# Patient Record
Sex: Male | Born: 1935
Health system: Southern US, Community
[De-identification: ages and names within clinical notes are randomized; demographics above are authoritative.]

## PROBLEM LIST (undated history)

## (undated) DIAGNOSIS — I2510 Atherosclerotic heart disease of native coronary artery without angina pectoris: Secondary | ICD-10-CM

## (undated) DIAGNOSIS — H269 Unspecified cataract: Secondary | ICD-10-CM

## (undated) DIAGNOSIS — C61 Malignant neoplasm of prostate: Secondary | ICD-10-CM

## (undated) DIAGNOSIS — G4730 Sleep apnea, unspecified: Secondary | ICD-10-CM

## (undated) DIAGNOSIS — E785 Hyperlipidemia, unspecified: Secondary | ICD-10-CM

## (undated) DIAGNOSIS — E119 Type 2 diabetes mellitus without complications: Secondary | ICD-10-CM

## (undated) DIAGNOSIS — G629 Polyneuropathy, unspecified: Secondary | ICD-10-CM

## (undated) DIAGNOSIS — I10 Essential (primary) hypertension: Secondary | ICD-10-CM

## (undated) DIAGNOSIS — I639 Cerebral infarction, unspecified: Secondary | ICD-10-CM

## (undated) DIAGNOSIS — I214 Non-ST elevation (NSTEMI) myocardial infarction: Secondary | ICD-10-CM

## (undated) DIAGNOSIS — U071 COVID-19: Secondary | ICD-10-CM

## (undated) HISTORY — PX: TONSILLECTOMY: SUR1361

## (undated) HISTORY — DX: Cerebral infarction, unspecified: I63.9

## (undated) HISTORY — DX: COVID-19: U07.1

## (undated) HISTORY — PX: INGUINAL HERNIA REPAIR: SUR1180

## (undated) HISTORY — PX: PROSTATE SURGERY: SHX751

## (undated) HISTORY — PX: NOSE SURGERY: SHX723

## (undated) HISTORY — PX: EYE SURGERY: SHX253

## (undated) HISTORY — DX: Atherosclerotic heart disease of native coronary artery without angina pectoris: I25.10

## (undated) HISTORY — DX: Polyneuropathy, unspecified: G62.9

## (undated) HISTORY — DX: Unspecified cataract: H26.9

## (undated) HISTORY — DX: Essential (primary) hypertension: I10

## (undated) HISTORY — DX: Hyperlipidemia, unspecified: E78.5

## (undated) HISTORY — DX: Type 2 diabetes mellitus without complications: E11.9

## (undated) HISTORY — DX: Malignant neoplasm of prostate: C61

## (undated) HISTORY — DX: Non-ST elevation (NSTEMI) myocardial infarction: I21.4

## (undated) HISTORY — PX: KNEE SURGERY: SHX244

## (undated) HISTORY — PX: COLONOSCOPY: SHX174

## (undated) HISTORY — DX: Sleep apnea, unspecified: G47.30

---

## 2001-09-09 ENCOUNTER — Encounter: Payer: Self-pay | Admitting: *Deleted

## 2001-09-09 ENCOUNTER — Inpatient Hospital Stay (HOSPITAL_COMMUNITY): Admission: EM | Admit: 2001-09-09 | Discharge: 2001-09-12 | Payer: Self-pay | Admitting: Cardiology

## 2001-10-03 ENCOUNTER — Encounter (HOSPITAL_COMMUNITY): Admission: RE | Admit: 2001-10-03 | Discharge: 2001-11-02 | Payer: Self-pay | Admitting: Cardiology

## 2001-11-13 ENCOUNTER — Encounter (HOSPITAL_COMMUNITY): Admission: RE | Admit: 2001-11-13 | Discharge: 2001-12-13 | Payer: Self-pay | Admitting: Cardiology

## 2001-12-15 ENCOUNTER — Encounter (HOSPITAL_COMMUNITY): Admission: RE | Admit: 2001-12-15 | Discharge: 2002-01-14 | Payer: Self-pay | Admitting: Cardiology

## 2002-01-15 ENCOUNTER — Encounter (HOSPITAL_COMMUNITY): Admission: RE | Admit: 2002-01-15 | Discharge: 2002-02-14 | Payer: Self-pay | Admitting: Cardiology

## 2002-03-13 ENCOUNTER — Emergency Department (HOSPITAL_COMMUNITY): Admission: EM | Admit: 2002-03-13 | Discharge: 2002-03-13 | Payer: Self-pay | Admitting: Emergency Medicine

## 2002-03-13 ENCOUNTER — Encounter: Payer: Self-pay | Admitting: Emergency Medicine

## 2003-08-26 ENCOUNTER — Encounter: Payer: Self-pay | Admitting: Family Medicine

## 2003-08-26 ENCOUNTER — Ambulatory Visit (HOSPITAL_COMMUNITY): Admission: RE | Admit: 2003-08-26 | Discharge: 2003-08-26 | Payer: Self-pay | Admitting: Family Medicine

## 2003-11-05 ENCOUNTER — Ambulatory Visit (HOSPITAL_COMMUNITY): Admission: RE | Admit: 2003-11-05 | Discharge: 2003-11-05 | Payer: Self-pay | Admitting: Internal Medicine

## 2003-12-18 ENCOUNTER — Emergency Department (HOSPITAL_COMMUNITY): Admission: EM | Admit: 2003-12-18 | Discharge: 2003-12-18 | Payer: Self-pay | Admitting: Emergency Medicine

## 2003-12-20 ENCOUNTER — Ambulatory Visit (HOSPITAL_COMMUNITY): Admission: RE | Admit: 2003-12-20 | Discharge: 2003-12-20 | Payer: Self-pay | Admitting: Family Medicine

## 2004-03-02 ENCOUNTER — Ambulatory Visit (HOSPITAL_COMMUNITY): Admission: RE | Admit: 2004-03-02 | Discharge: 2004-03-02 | Payer: Self-pay | Admitting: Orthopaedic Surgery

## 2004-04-21 ENCOUNTER — Ambulatory Visit: Admission: RE | Admit: 2004-04-21 | Discharge: 2004-07-20 | Payer: Self-pay

## 2004-09-02 ENCOUNTER — Ambulatory Visit (HOSPITAL_COMMUNITY): Admission: RE | Admit: 2004-09-02 | Discharge: 2004-09-02 | Payer: Self-pay | Admitting: Urology

## 2004-09-02 ENCOUNTER — Ambulatory Visit (HOSPITAL_BASED_OUTPATIENT_CLINIC_OR_DEPARTMENT_OTHER): Admission: RE | Admit: 2004-09-02 | Discharge: 2004-09-02 | Payer: Self-pay | Admitting: Urology

## 2004-09-07 ENCOUNTER — Ambulatory Visit (HOSPITAL_COMMUNITY): Admission: RE | Admit: 2004-09-07 | Discharge: 2004-09-07 | Payer: Self-pay | Admitting: Family Medicine

## 2004-09-22 ENCOUNTER — Ambulatory Visit: Admission: RE | Admit: 2004-09-22 | Discharge: 2004-10-19 | Payer: Self-pay | Admitting: Radiation Oncology

## 2005-06-20 ENCOUNTER — Ambulatory Visit: Admission: RE | Admit: 2005-06-20 | Discharge: 2005-06-20 | Payer: Self-pay | Admitting: Family Medicine

## 2005-06-28 ENCOUNTER — Ambulatory Visit: Payer: Self-pay | Admitting: Pulmonary Disease

## 2006-03-10 ENCOUNTER — Ambulatory Visit (HOSPITAL_COMMUNITY): Admission: RE | Admit: 2006-03-10 | Discharge: 2006-03-10 | Payer: Self-pay | Admitting: Gastroenterology

## 2006-03-14 ENCOUNTER — Ambulatory Visit: Payer: Self-pay | Admitting: Gastroenterology

## 2006-06-05 ENCOUNTER — Emergency Department (HOSPITAL_COMMUNITY): Admission: EM | Admit: 2006-06-05 | Discharge: 2006-06-05 | Payer: Self-pay | Admitting: Emergency Medicine

## 2007-03-13 ENCOUNTER — Ambulatory Visit: Payer: Self-pay | Admitting: Cardiology

## 2007-03-13 ENCOUNTER — Observation Stay (HOSPITAL_COMMUNITY): Admission: EM | Admit: 2007-03-13 | Discharge: 2007-03-14 | Payer: Self-pay | Admitting: Emergency Medicine

## 2007-03-16 ENCOUNTER — Ambulatory Visit: Payer: Self-pay

## 2007-03-29 ENCOUNTER — Ambulatory Visit: Payer: Self-pay

## 2007-04-25 ENCOUNTER — Ambulatory Visit: Payer: Self-pay | Admitting: Cardiology

## 2007-09-13 ENCOUNTER — Ambulatory Visit (HOSPITAL_BASED_OUTPATIENT_CLINIC_OR_DEPARTMENT_OTHER): Admission: RE | Admit: 2007-09-13 | Discharge: 2007-09-13 | Payer: Self-pay | Admitting: Surgery

## 2007-10-03 ENCOUNTER — Ambulatory Visit: Payer: Self-pay | Admitting: Cardiology

## 2008-08-28 ENCOUNTER — Emergency Department (HOSPITAL_COMMUNITY): Admission: EM | Admit: 2008-08-28 | Discharge: 2008-08-28 | Payer: Self-pay | Admitting: Emergency Medicine

## 2008-12-31 ENCOUNTER — Ambulatory Visit: Payer: Self-pay | Admitting: Cardiology

## 2009-10-07 ENCOUNTER — Encounter: Payer: Self-pay | Admitting: Cardiology

## 2010-01-19 DIAGNOSIS — E785 Hyperlipidemia, unspecified: Secondary | ICD-10-CM | POA: Insufficient documentation

## 2010-01-19 DIAGNOSIS — G589 Mononeuropathy, unspecified: Secondary | ICD-10-CM | POA: Insufficient documentation

## 2010-01-19 DIAGNOSIS — G4733 Obstructive sleep apnea (adult) (pediatric): Secondary | ICD-10-CM | POA: Insufficient documentation

## 2010-01-19 DIAGNOSIS — I2510 Atherosclerotic heart disease of native coronary artery without angina pectoris: Secondary | ICD-10-CM | POA: Insufficient documentation

## 2010-01-19 DIAGNOSIS — Z8546 Personal history of malignant neoplasm of prostate: Secondary | ICD-10-CM | POA: Insufficient documentation

## 2010-01-19 DIAGNOSIS — I10 Essential (primary) hypertension: Secondary | ICD-10-CM | POA: Insufficient documentation

## 2010-01-20 ENCOUNTER — Ambulatory Visit: Payer: Self-pay | Admitting: Cardiology

## 2010-12-09 ENCOUNTER — Telehealth: Payer: Self-pay | Admitting: Cardiology

## 2011-01-05 NOTE — Assessment & Plan Note (Addendum)
Summary: f1y   Visit Type:  1 year follow up  CC:  No cardiac complains.  History of Present Illness: Has a dry cough, but no productive cough.  Drl [name] checked his cholesterol a while back.  No chest pain at all.  It has been more than 8 years since his MI.    Current Medications (verified): 1)  Gabapentin 300 Mg Caps (Gabapentin) .... Take 1 Capsule By Mouth Two Times A Day 2)  Doxazosin Mesylate 2 Mg Tabs (Doxazosin Mesylate) .... Take 1 Tablet By Mouth Once A Day 3)  Metoprolol Tartrate 25 Mg Tabs (Metoprolol Tartrate) .... Take One Tablet By Mouth Twice A Day 4)  Nortriptyline Hcl 25 Mg Caps (Nortriptyline Hcl) .... Take 1 Capsule By Mouth Three Times A Day 5)  Metformin Hcl 500 Mg Tabs (Metformin Hcl) .... Take 1 Tablet By Mouth Two Times A Day 6)  Aspirin 81 Mg Tbec (Aspirin) .... Take One Tablet By Mouth Daily 7)  Calcium Carbonate-Vitamin D 600-400 Mg-Unit  Tabs (Calcium Carbonate-Vitamin D) .... Take 1 Tablet By Mouth Once A Day 8)  Multivitamins  Tabs (Multiple Vitamin) .... Take 1 Tablet By Mouth Once A Day 9)  Vitamin D 5,000 Iu Cap .... Take 1 Capsule By Mouth Once A Day 10)  Lipitor 80 Mg Tabs (Atorvastatin Calcium) .... Take One Tablet By Mouth Daily. 11)  Losartan Potassium 50 Mg Tabs (Losartan Potassium) .... Take 1 Tablet By Mouth Once A Day  Allergies (verified): No Known Drug Allergies  Vital Signs:  Patient profile:   77 year old male Height:      74 inches Weight:      167.75 pounds BMI:     21.62 Pulse rate:   65 / minute Pulse rhythm:   irregular Resp:     18 per minute BP sitting:   159 / 76  (left arm) Cuff size:   large  Vitals Entered By: Sidney Ace (January 20, 2010 9:59 AM)  Physical Exam  General:  Well developed, well nourished, in no acute distress. Head:  normocephalic and atraumatic Eyes:  PERRLA/EOM intact; conjunctiva and lids normal. Ears:  TM's intact and clear with normal canals and hearing Lungs:  Clear bilaterally to  auscultation and percussion. Heart:  PMI non displaced.  Minimal SEM.  Pos S4.    Extremities:  No clubbing or cyanosis.   Impression & Recommendations:  Problem # 1:  CAD (ICD-414.00) Absolutely no symptoms.   Cannot exercise because of neuropathy.  Anatomy reviewed.  (2 non DES in CFX and RCA).   His updated medication list for this problem includes:    Metoprolol Tartrate 25 Mg Tabs (Metoprolol tartrate) .Marland Kitchen... Take one tablet by mouth twice a day    Aspirin 81 Mg Tbec (Aspirin) .Marland Kitchen... Take one tablet by mouth daily  Problem # 2:  HYPERLIPIDEMIA (ICD-272.4)  High dose statins, with followup with Dr. [name].   His updated medication list for this problem includes:    Lipitor 80 Mg Tabs (Atorvastatin calcium) .Marland Kitchen... Take one tablet by mouth daily.  Orders: EKG w/ Interpretation (93000)  His updated medication list for this problem includes:    Lipitor 80 Mg Tabs (Atorvastatin calcium) .Marland Kitchen... Take one tablet by mouth daily.  Problem # 3:  HYPERTENSION, UNSPECIFIED (ICD-401.9) Controlled.  May consider carvedilol in future  [id number].   His updated medication list for this problem includes:    Doxazosin Mesylate 2 Mg Tabs (Doxazosin mesylate) .Marland Kitchen... Take 1 tablet by mouth  once a day    Metoprolol Tartrate 25 Mg Tabs (Metoprolol tartrate) .Marland Kitchen... Take one tablet by mouth twice a day    Aspirin 81 Mg Tbec (Aspirin) .Marland Kitchen... Take one tablet by mouth daily    Losartan Potassium 50 Mg Tabs (Losartan potassium) .Marland Kitchen... Take 1 tablet by mouth once a day  Orders: EKG w/ Interpretation (93000)  Patient Instructions: 1)  Your physician recommends that you continue on your current medications as directed. Please refer to the Current Medication list given to you today. 2)  Your physician wants you to follow-up in:  1 YEAR.  You will receive a reminder letter in the mail two months in advance. If you don't receive a letter, please call our office to schedule the follow-up appointment.

## 2011-01-07 NOTE — Progress Notes (Signed)
Summary: Call from Dr Lance Sell office  Phone Note From Other Clinic   Caller: Pam with Dr Sallee Lange Summary of Call: Dr [name] would like Dr Maren Beach opinion about whether it would be safe for this pt to take a low dose Nortriptyline for neuropathy.  We can call there office at [phone].  Initial call taken by: Theodosia Quay, RN, BSN,  December 09, 2010 11:53 AM  Follow-up for Phone Call        pam from dr luking's office calling re msg left on the 4th re nortriptyline for neuropathy-pls call [phone] Lorenda Hatchet  December 11, 2010 2:40 PM  Dr Lia Foyer would defer to Dr Lance Sell judgment. However tricyclics have been linked to increase BP, DM and other traditional cardiac risk factors.  Theodosia Quay, RN, BSN  December 11, 2010 6:15 PM  Additional Follow-up for Phone Call Additional follow up Details #1::        I spoke with Pam and made her aware of Dr Maren Beach recommendations.  I also made her aware that per our office note in February 2011 this pt had Nortriptyline on his medication list.  Additional Follow-up by: Theodosia Quay, RN, BSN,  December 14, 2010 10:16 AM

## 2011-01-26 ENCOUNTER — Encounter: Payer: Self-pay | Admitting: Cardiology

## 2011-01-26 ENCOUNTER — Ambulatory Visit (INDEPENDENT_AMBULATORY_CARE_PROVIDER_SITE_OTHER): Payer: Medicare Other | Admitting: Cardiology

## 2011-01-26 DIAGNOSIS — E7800 Pure hypercholesterolemia, unspecified: Secondary | ICD-10-CM

## 2011-01-26 DIAGNOSIS — I2510 Atherosclerotic heart disease of native coronary artery without angina pectoris: Secondary | ICD-10-CM

## 2011-01-26 DIAGNOSIS — I10 Essential (primary) hypertension: Secondary | ICD-10-CM

## 2011-02-16 NOTE — Assessment & Plan Note (Signed)
Summary: f1y   CC:  check up.  History of Present Illness: Mr. Sondgeroth is in for followup.  Doing well at present.  No major issues.  Feels good.  We did get call about using tricyclics for neuropathy, but with BP think that probably should be avoided unless Dr. [name] thinks that is required.  Was taking before.   Problems Prior to Update: 1)  Cad  (ICD-414.00) 2)  Hypertension, Unspecified  (ICD-401.9) 3)  Hyperlipidemia  (ICD-272.4) 4)  Sleep Apnea, Obstructive  (ICD-327.23) 5)  Neuropathy  (ICD-355.9) 6)  Prostate Cancer, Hx of  (ICD-V10.46)  Current Medications (verified): 1)  Metoprolol Tartrate 25 Mg Tabs (Metoprolol Tartrate) .... Take One Tablet By Mouth Twice A Day 2)  Metformin Hcl 500 Mg Tabs (Metformin Hcl) .... Take 1 Tablet By Mouth Two Times A Day 3)  Aspirin 81 Mg Tbec (Aspirin) .... Take One Tablet By Mouth Daily 4)  Calcium Carbonate-Vitamin D 600-400 Mg-Unit  Tabs (Calcium Carbonate-Vitamin D) .... Take 1 Tablet By Mouth Once A Day 5)  Multivitamins  Tabs (Multiple Vitamin) .... Take 1 Tablet By Mouth Once A Day 6)  Vitamin D 5,000 Iu Cap .... Take 1 Capsule By Mouth Once A Day 7)  Lipitor 80 Mg Tabs (Atorvastatin Calcium) .... Take One Tablet By Mouth Daily. 8)  Losartan Potassium 100 Mg Tabs (Losartan Potassium) .Marland Kitchen.. 1 Tab By Mouth Once Daily 9)  Lyrica 50 Mg Caps (Pregabalin) .Marland Kitchen.. 1  Tab By Mouth Once Daily 10)  Vitamin B-12 1000 Mcg Tabs (Cyanocobalamin) .Marland Kitchen.. 1 Tab By Mouth Once Daily 11)  Fish Oil   Oil (Fish Oil) .Marland Kitchen.. 1  Tab By Mouth Once Daily 12)  Allegra Allergy 180 Mg Tabs (Fexofenadine Hcl) .... As Needed  Allergies (verified): No Known Drug Allergies  Past History:  Past Medical History: Last updated: 02-15-10 Current Problems:  CAD (ICD-414.00)0-  HYPERTENSION, UNSPECIFIED (ICD-401.9) HYPERLIPIDEMIA (ICD-272.4) SLEEP APNEA, OBSTRUCTIVE (ICD-327.23) NEUROPATHY (ICD-355.9) PROSTATE CANCER, HX OF (ICD-V10.46)  Family History: Last updated:  2010-02-15  Mother deceased in her 32s secondary to Alzheimer's, also had a history of MI.  Father deceased at age 9, history of coronary artery disease status post MI.  No siblings.  Social History: Last updated: Feb 15, 2010  He lives in [place] with his wife.  He is retired  but currently Development worker, community for [employer].  Denies any tobacco, EtOH or herbal medication use.  She tries to follow a heart-healthy diet.  He is very active.  He walks.  Vital Signs:  Patient profile:   77 year old male Height:      74 inches Weight:      161 pounds BMI:     20.75 Pulse rate:   57 / minute Resp:     18 per minute BP sitting:   190 / 90  (left arm)  Vitals Entered By: Burnett Kanaris (January 26, 2011 3:27 PM)  Physical Exam  General:  Well developed, well nourished, in no acute distress. Head:  normocephalic and atraumatic Eyes:  PERRLA/EOM intact; conjunctiva and lids normal. Lungs:  Clear bilaterally to auscultation and percussion. Heart:  PMI non displaced.  Normal S1 and S2. Minimal SEM no  rub.  Recheck of BP 180/80.   Abdomen:  Bowel sounds positive; abdomen soft and non-tender without masses, organomegaly, or hernias noted. No hepatosplenomegaly. Extremities:  No clubbing or cyanosis. Neurologic:  Alert and oriented x 3.   EKG  Procedure date:  01/26/2011  Findings:  SB with first degree av block, otherwise unremarkable.    Impression & Recommendations:  Problem # 1:  CAD (ICD-414.00) Continues to remain stable with no current symptoms.   His updated medication list for this problem includes:    Metoprolol Tartrate 25 Mg Tabs (Metoprolol tartrate) .Marland Kitchen... Take one tablet by mouth twice a day    Aspirin 81 Mg Tbec (Aspirin) .Marland Kitchen... Take one tablet by mouth daily  Orders: EKG w/ Interpretation (93000)  Problem # 2:  HYPERTENSION, UNSPECIFIED (ICD-401.9) Patients BP are elevated.  Think he should likely check BPs at home and bring list to Dr. [name] for  followup, and reassessment should they remain elevated.  On a multidrug regimen at this point.  Continue the same.  The following medications were removed from the medication list:    Doxazosin Mesylate 2 Mg Tabs (Doxazosin mesylate) .Marland Kitchen... Take 1 tablet by mouth once a day His updated medication list for this problem includes:    Metoprolol Tartrate 25 Mg Tabs (Metoprolol tartrate) .Marland Kitchen... Take one tablet by mouth twice a day    Aspirin 81 Mg Tbec (Aspirin) .Marland Kitchen... Take one tablet by mouth daily    Losartan Potassium 100 Mg Tabs (Losartan potassium) .Marland Kitchen... 1 tab by mouth once daily  Orders: EKG w/ Interpretation (93000)  Problem # 3:  HYPERLIPIDEMIA (ICD-272.4) remains on aggressive lipid lowering folllowed by Dr. [name].  His updated medication list for this problem includes:    Lipitor 80 Mg Tabs (Atorvastatin calcium) .Marland Kitchen... Take one tablet by mouth daily.  Patient Instructions: 1)  Your physician recommends that you schedule a follow-up appointment in: 1 year with Dr. Lia Foyer 2)  Your physician recommends that you continue on your current medications as directed. Please refer to the Current Medication list given to you today.

## 2011-04-20 NOTE — Letter (Signed)
December 31, 2008    Scott A. [name], MD  [address] [place], [place] [address]   RE:  TREQUON, PAONE  MRN:  [id number]  /  DOB:  05-31-1931   Dear Nicki Reaper:   I had the pleasure seeing your very nice patient, Luke Solis in the  office today in followup.  Cardiac wise he seems to be getting along  extraordinarily well.  He denies chest pain or significant shortness of  breath.  He is able to do almost anything, and has been working at  [employer] for [employer] and just enjoying himself.  He tells me that he has  been checking his sugars regularly, with evidence of underlying mild  diabetes.   MEDICATIONS:  Include  1. Zetia 10 mg daily.  2. Lisinopril 20 mg daily.  3. Neurontin 300 mg p.o. b.i.d.  4. Pamelor 25 mg 3 tablets daily.  5. Cardura 2 mg daily.  6. Multivitamin daily.  7. Toprol-XL 100 mg daily.  8. Aspirin 81 mg daily.  9. Zocor 80 mg at bedtime.  10.Calcium daily.  11.Fiber-Lax.  12.Vitamin D.   PHYSICAL EXAMINATION:  He is alert and oriented, in no distress.  Blood  pressure is 120/80, pulse is 67.  The lung fields are clear.  The  cardiac rhythm is regular with a soft S4 gallop.  The abdomen is soft  without obvious widened pulsation.   This very nice gentleman is doing extremely well.  I plan to have him  return to see me in followup at 1 year's time.  With regard to his  neuropathy, he has had burning in his feet, but certainly sounds like it  could be in part related to his diabetes.  I think it would be  progressive and might be worthwhile to get a neurologic opinion.  Certainly, we would leave this to your discretion.   He and I had a thorough discussion about his long-term situation and  with underlying diabetic coronary disease, it would be important to be  aggressive in his management.  I appreciate the opportunity of sharing  in his care and we will see him back in followup in 1 year's time.   ADDENDUM:  EKG reveals sinus rhythm, rate is 64.  PR  intervals 216  milliseconds compatible with first-degree AV block, otherwise normal  tracing.    Sincerely,      [name]. Lia Foyer, MD, [facility]  Electronically Signed    TDS/MedQ  DD: 12/31/2008  DT: 01/01/2009  Job #: [id number]

## 2011-04-20 NOTE — Op Note (Signed)
NAMEMITHUN, GOLDSWORTHY                 ACCOUNT NO.:  [id number]   MEDICAL RECORD NO.:  [id number]          PATIENT TYPE:  AMB   LOCATION:  DSC                          FACILITY:  [facility]   PHYSICIAN:  Earnstine Regal, MD      DATE OF BIRTH:  1931/10/18   DATE OF PROCEDURE:  09/13/2007  DATE OF DISCHARGE:                               OPERATIVE REPORT   PREOPERATIVE DIAGNOSIS:  Recurrent right inguinal hernia.   POSTOPERATIVE DIAGNOSIS:  Recurrent right inguinal hernia.   PROCEDURE:  Repair recurrent right inguinal hernia with Ethicon UltraPro  mesh.   SURGEON:  Earnstine Regal, MD, FACS   ANESTHESIA:  General.   ESTIMATED BLOOD LOSS:  Minimal.   PREPARATION:  Betadine.   COMPLICATIONS:  None.   INDICATIONS:  The patient is a 77 year old white male known to my  surgical practice.  The patient developed a recurrence of his right  inguinal hernia during the summer of 2008.  This has been reducible.  He  notes a burning-type sensation in the groin.  He had had a previous  repair 7 years ago by Dr. Romona Curls in [place].  He now comes to  surgery for repair of recurrent right inguinal hernia.   BODY OF REPORT:  The procedure was done in OR #6 at the [facility]  surgery center.  The patient is brought to the operating room, placed in  a supine position on the operating room table.  Following administration  of general anesthesia, the patient is prepped and draped in the usual  strict aseptic fashion.  After ascertaining that an adequate level of  anesthesia had been obtained, a right inguinal incision is made with a  #15 blade.  Dissection is carried through subcutaneous tissues.  External oblique fascia is incised in line with its fibers and extended  through the scar tissue which surrounds the external inguinal ring.  Cord structures are identified and carefully dissected out of the  inguinal canal using the electrocautery for hemostasis.  Margins of the  inguinal canal are defined.   Pubic tubercle is defined.  Spermatic cord  structures are mobilized and encircled with a Penrose drain.  Floor of  the inguinal canal is dissected out.  There appears to be an indirect  inguinal hernia sac.  This is dissected away from the cord structures.  Hernia sac is dissected out up to the level of the internal inguinal  ring.  The sac is opened.  A high ligation of the sac is performed with  a 2-0 silk suture ligature.  Sac is amputated with the electrocautery.  Next the floor of the inguinal canal is recreated with a sheet of  Ethicon UltraPro mesh.  Mesh is cut to the appropriate dimensions.  It  is secured to the pubic tubercle and along the inguinal ligament with a  running 2-0 Novofil suture.  Mesh is split to accommodate the cord  structures.  Superior margin of the mesh is secured to the transversalis  and internal oblique fascia with interrupted 2-0 Novofil sutures.  Tails  of the mesh  were overlapped lateral to the cord structures and secured  to the inguinal ligament with interrupted 2-0 Novofil suture.  Good  hemostasis is noted.  Local field block is placed with Marcaine.  Cord  structures are returned to the inguinal canal.  External oblique fascia  is closed with interrupted 3-0 Vicryl sutures.  Subcutaneous tissues are  closed with interrupted 3-0 Vicryl sutures.  Skin was anesthetized with  local anesthetic.  Skin is closed with a running 4-0 Vicryl subcuticular  suture.  Wound is washed and dried and Benzoin and Steri-Strips are  applied.  Sterile dressings are applied.  The patient is awakened from  anesthesia and brought to the recovery room in stable condition.  The  patient tolerated the procedure well.      Earnstine Regal, MD  Electronically Signed     TMG/MEDQ  D:  09/13/2007  T:  09/13/2007  Job:  [id number]   cc:   Nicki Reaper A. [name], MD  [name] Lia Foyer, MD, [facility]

## 2011-04-20 NOTE — Letter (Signed)
July 17, 2007    Earnstine Regal, MD  [phone], [place] [address]   RE:  [name], [name]  MRN:  [id number]  /  DOB:  04-Mar-1931   Dear Sherren Mocha:   Mr. Luke Solis is scheduled to have inguinal hernia surgery.  He should  be stable from a cardiac standpoint.  He has had previous stenting of  the right and circumflex coronary arteries and these are with non-drug  eluting platforms.  He has remained on chronic low dose aspirin.  In  addition, the patient had a radionuclide imaging study done in April  2008.  At that time there was no significant perfusion defect.  Based on  the current available data, he should be stable to undergo noncardiac  surgery.  We would be happy to see him during his hospitalization.  He  should continue his Toprol leading into the operation.  Please call us  for any questions.    Sincerely,      [name]. Lia Foyer, MD, [facility]  Electronically Signed    TDS/MedQ  DD: 07/17/2007  DT: 07/18/2007  Job #: [id number]

## 2011-04-20 NOTE — Letter (Signed)
October 03, 2007    Scott A. [name], MD  [address] [place], [place] [address]   RE:  Luke Solis  MRN:  [id number]  /  DOB:  1931/09/01   Dear Nicki Reaper,   I had the pleasure of seeing your nice patient, Luke Solis, in the  office today in followup. He has recently undergone a hernia repair by  Dr. Armandina Gemma. He apparently has done well from this standpoint. As  you know, he had non drug-eluting stents previously placed in the right  coronary artery and the circumflex system. He has had no real problems  with these. He has gotten along extremely well. Myocardial perfusion  imaging was done recently. This revealed no significant changes of  ischemia, had a normal stress nuclear study. Ejection fraction was 68%.   He is slow to recover but getting along reasonably well. His wife asked  whether or not he needs to wear his mask for sleep apnea, and I  certainly encouraged this.   His medications include:  1. Zetia 10 mg daily.  2. Lisinopril 20 mg daily.  3. Neurontin 300 mg b.i.d.  4. Pamelor 25 mg 3 tablets daily.  5. Cardura 2 mg daily.  6. Lipitor 80 mg q.h.s.  7. Prilosec 20 mg daily.  8. Multivitamin daily.  9. Toprol XL 100 mg a day.  10.Aspirin 81 mg daily.   On physical examination, he is alert and oriented. Blood pressure is  140/70, the pulse is 70. The lungs are clear and the cardiac rhythm is  regular.   Electrocardiogram  demonstrates a normal sinus rhythm with left axis  deviation.   To summarize, this very nice gentleman underwent hernia repair without  difficulty. He should remain on the same medical regimen. We will see  him back in followup in a year. Thanks for allowing me to share in his  care.    Sincerely,      [name]. Lia Foyer, MD, [facility]  Electronically Signed    TDS/MedQ  DD: 10/03/2007  DT: 10/04/2007  Job #: [id number]

## 2011-04-23 NOTE — Discharge Summary (Signed)
[place]  Patient:    Luke Solis, Luke Solis Visit Number: [id number] MRN: [id number]          Service Type: MED Location: [id number] Attending Physician:  Wadie Lessen Dictated by:   Sharyl Nimrod, P.A.-C. Admit Date:  09/09/2001 Discharge Date: 09/12/2001   CC:         Dr. Sallee Lange   Referring Physician Discharge Boling:  1931/09/06  SUMMARY OF HISTORY:  Luke Solis is a 77 year old white male who is referred for evaluation of chest discomfort to [facility] emergency room and was transferred for further evaluation.  The discomfort began at 8 a.m., began in his jaw, and he said he felt bad and hurt all over.  He felt clammy.  He walks one to one-and-a-half miles per day without difficulty.  He does have a history of hypertension, hyperlipidemia.  LABORATORY DATA:  At [facility], initial CK was 178 with an MB of 8.1, relative index 4.6, troponin 0.39.  Second CK was 167 with MB 9.0, relative index 5.4, troponin 0.95.  H&H was 12.2 and 34.1, normal indices, platelets 165, wbcs 7.1.  Sodium 141, potassium 5.2, BUN 13, creatinine 1.0, glucose 131.  Hemoglobin A1c 5.5.  EKGs show sinus bradycardia, slight ST elevation in leads III and aVF at [facility] on presentation; however, on subsequent EKGs this had resolved. Subsequent EKGs essentially showed sinus bradycardia, nonspecific ST-T wave changes, left axis deviation, left anterior hemiblock.  HOSPITAL COURSE:  Luke Solis was admitted to the unit 2000.  He was started on IV heparin and IIb/IIIa inhibitor.  He was started on aspirin and arrangements for cardiac catheterization were made.  On November 7 Dr. Lia Foyer performed cardiac catheterization.  According to his progress notes, this showed an EF of 50%.  He had a 95% mid RCA, 30% distal RCA, 90% mid circumflex, 30% proximal LAD, 70-80% mid LAD, with TIMI grade 2 flow.  Dr. Lia Foyer performed angioplasty  stenting to the mid RCA and mid circumflex, reducing both lesions to less than 10%.  Post sheath removal and bedrest he was ambulating without difficulty and medications were adjusted.  On October 8 Dr. Lia Foyer, after reviewing the chart, felt the patient could be discharged home.  DISCHARGE DIAGNOSES: 1. Non-Q-wave myocardial infarction. 2. Status post angioplasty stenting to the right coronary artery and    circumflex as previously described. 3. History of hyperlipidemia and hypertension.  DISPOSITION:  Patient is discharged home.  MEDICATIONS: 1. Lopressor 50 mg one-half tablet b.i.d. 2. Aspirin 325 mg q.d. 3. Plavix 75 mg q.d. for four weeks. 4. Lipitor 10 mg q.h.s. 5. Sublingual nitroglycerin as needed.  ACTIVITY:  He was advised no lifting, driving, sexual activity, or heavy exertion for one week.  He may walk and drive post one week.  No working until seeing the physician.  DIET:  Maintain low salt/fat/cholesterol diet.  WOUND CARE:  If he had any problems with his catheterization site he was asked to call.  SPECIAL INSTRUCTIONS:  He was instructed not to take Cardura.  FOLLOW-UP:  He will have a stress Cardiolite on October 22 at 8 a.m.  He was instructed nothing to eat or drink after midnight.  He will receive information in the mail, and he was asked not to take his Lopressor on the morning of the stress test.  He will follow up with Dr. Lia Foyer on September 27, 2001 at 10:15 a.m. in the  [place] office.  At some point in the future, consider to transfer the patient to the [place] office.  Information from Dr. [name] in regards to his lipid status should also be obtained and consideration should be given to referring patient to cardiac rehab in [place] for further education on dietary and exercise instruction. Dictated by:   Sharyl Nimrod, P.A.-C. Attending Physician:  Wadie Lessen DD:  09/12/01 TD:  09/12/01 Job: [id number]

## 2011-04-23 NOTE — Discharge Summary (Signed)
NAMEZAYION, Luke Solis                 ACCOUNT NO.:  [id number]   MEDICAL RECORD NO.:  [id number]          PATIENT TYPE:  OBV   LOCATION:  6524                         FACILITY:  [facility]   PHYSICIAN:  Shaune Pascal. Bensimhon, MDDATE OF BIRTH:  January 19, 1931   DATE OF ADMISSION:  03/13/2007  DATE OF DISCHARGE:  03/14/2007                         DISCHARGE SUMMARY - REFERRING   ADMITTING AND DISCHARGING PHYSICIANS:  Dr. Remonia Richter and Dr.  Haroldine Laws.   DISCHARGE DIAGNOSES:  1. Prolonged atypical chest discomfort.  2. Hypertension.  3. Hyperlipidemia.  4. History as noted below.   BRIEF HISTORY:  Mr. Luke Solis is a 77 year old white male who presented for  further evaluation on March 13, 2007, after eating bacon, eggs and a  biscuit for breakfast.  He states he usually has cereal.  He developed  mid-sternal chest tightness, which he described as constant.  He took  several TUMs without relief, ran several errands, continued discomfort.  He did not have any associated symptoms.  Due to the persistence  throughout the entire day, his wife insisted he seek evaluation.   PAST MEDICAL HISTORY:  Notable for hyperlipidemia, catheterization with  stent to the RCA on October 2002, as well as to the mid-circumflex,  associated with a non-ST elevated myocardial infarction, history of  hypertension, neuropathy, obstructive sleep apnea with CPAP.   LABORATORY DATA:  Chest x-ray on the 7th showed minimal bronchitic  changes, question right nipple shadow.  Recommended follow-up two-view  chest x-ray.  Admission weight was 97 kg, admission H&H is 12.6 and  37.3, normal indices, platelets 191, WBCs 7.4, sodium 140, potassium 36,  BUN 16, creatinine 1.14, glucose 114, normal LFTs on the 8th.  CK-MBs  and troponins were negative for myocardial infarction.  Fasting lipids  on the 8th showed a total cholesterol 127, triglycerides 75, HDL 38, LDL  74.  EKGs throughout his admission showed normal sinus rhythm, left  axis  deviation, first-degree AV block, incomplete right bundle branch block.   HOSPITAL COURSE:  Overnight, Mr. Luke Solis did not have any further chest  discomfort, enzymes and EKGs were negative for myocardial infarction.  His blood pressure initially on presentation was 173/88 and then 200/80.  However, this improved.  Dr. Haroldine Laws reviewed the patient's history,  spoke with the patient and felt that he could be discharged home with  outpatient Myoview and follow-up.  Dr. Haroldine Laws further elaborated that  patient felt that chest discomfort was very different than previous  angina symptoms.   DISPOSITION:  Mr. Luke Solis is discharged home.  His activities are not  restricted.  He was asked to continue his home medications.  These  include Cardura 1 mg q.h.s., Lipitor 80 mg q.h.s., lisinopril 10 daily,  Toprol XL 50 mg q.h.s., Pamelor 50 mg daily, aspirin 325 daily, Prilosec  20 mg, Zetia 10 mg daily, Neurontin 300 mg b.i.d., and he was given a  new prescription for nitroglycerin 0.4 p.r.n.  He was asked to bring all  medications to all appointments.  He will have a stress Myoview on  Thursday March 16, 2007 at 10:00 a.m.  He was asked not to take his  Toprol XL on the evening prior to the test, as well as not to eat or  drink after midnight; however he could take his other  medications with a small sip of water.  He will follow up with Dr.  Maren Beach PA on March 22, 2007 at 11:45, given that Dr. Lia Foyer is not  available and Dr. [name] as needed.   DISCHARGE TIME:  25 minutes.      Sharyl Nimrod, PA-C      Shaune Pascal. Bensimhon, MD  Electronically Signed    EW/MEDQ  D:  03/14/2007  T:  03/14/2007  Job:  [id number]   cc:   [name]. Lia Foyer, MD, [facility]  Dr. [name]

## 2011-04-23 NOTE — Op Note (Signed)
Luke Solis, Luke Solis                 ACCOUNT NO.:  [id number]   MEDICAL RECORD NO.:  [id number]          PATIENT TYPE:  AMB   LOCATION:  NESC                         FACILITY:  [facility]   PHYSICIAN:  Ronald L. Parks Neptune, M.D.DATE OF BIRTH:  08-10-1931   DATE OF PROCEDURE:  09/02/2004  DATE OF DISCHARGE:                                 OPERATIVE REPORT   PREOPERATIVE DIAGNOSIS:  Adenocarcinoma of the prostate.   POSTOPERATIVE DIAGNOSIS:  Adenocarcinoma of the prostate.   PROCEDURE:  Transperineal implantation of palladium 103 seeds into the  prostate and flexible cystourethroscopy.   SURGEON:  Duane Lope. Rosana Hoes, M.D.   ASSISTANT:  Truddie Crumble, M.D.   ANESTHESIA:  General endotracheal.   ESTIMATED BLOOD LOSS:  15 cc.   DRAINS:  48 French Foley.   COMPLICATIONS:  None.   SEEDS IMPLANTED:  A total of 92 palladium 103 seeds were implanted with 28  needles at 123456 millicuries per seed.   INDICATIONS FOR PROCEDURE:  Luke Solis is a very nice 77 year old white male  who originally presented with a PSA of 4.8 and underwent ultrasound biopsy  of the prostate and was subsequently found to have a Gleason score of 6  which was 3 + 3 adenocarcinoma on 5% of the biopsy from the right side of  the prostate.  We have discussed the risks, benefits, and alternatives.  After undergoing proper preoperative evaluation, he has elected to proceed  with seed implantation.  He has been properly stimulated and properly  informed.   DESCRIPTION OF PROCEDURE:  The patient was placed in the supine position  after proper general endotracheal anesthesia.  He was placed in the dorsal  lithotomy position and prepped and draped with Betadine in sterile fashion.   A 16 French Foley catheter was inserted into the bladder, and the bladder  was drained.  The balloon was inflated with 10 cc of contrast solution.  The  transrectal ultrasound was placed on the stepper device and localized to  preplanned  coordinates.  A 6 French red rubber tube was placed in the rectum  to prevent flatus.  Fluoroscopy was placed and localized after the perineum  had been prepped.  Intraoperative planning was performed with the Purdett  device and seed augmentation was performed.  The total number of seeds was  not changed, but some needle position did change as documented in the  record.  Both urethral, rectal, and prostate doses were calculated and felt  to be appropriate.  Two holding needles were placed in unused coordinates.  Proper measurements were obtained and utilized in both ultrasound and  fluoroscopy in both the physical and electronic grids.  Seeds were placed in  preplanned coordinates.  A total of 92 palladium 103 seeds were implanted  with 28 needles at 123456 millicuries per seed.  We were comfortable with  localization both by ultrasound and fluoroscopically.  The transrectal  ultrasound probe along with the red rubber catheter were removed.  The wound  was dressed sterilely.  Static images were obtained fluoroscopically, and we  were comfortable with seed  localization.   The patient was placed in the supine position.  The Foley catheter was  removed and scanned for seeds, and there were none within it.  Flexible  cystourethroscopy was then performed with an Olympus flexible  cystourethroscope.  He was noted to have moderate trilobar hypertrophy,  grade 1 trabeculation of the bladder, and efflux of clear urine was noted  from the normally placed ureteral orifices bilaterally.  There were no  lesions, seeds, spacers, etc., in the bladder or the urethra.  The flexible  cystourethroscope was visually removed.  A new 66 French Foley catheter was  inserted and the bladder drained.  The patient was taken to the recovery  room in stable condition.      RLD/MEDQ  D:  09/02/2004  T:  09/02/2004  Job:  [id number]

## 2011-04-23 NOTE — Cardiovascular Report (Signed)
[place]. [facility]  Patient:    LENIN, ICENHOWER Visit Number: [id number] MRN: [id number]          Service Type: MED Location: [id number] Attending Physician:  Wadie Lessen Dictated by:   Vanna Scotland Olevia Perches, M.D. [facility] Proc. Date: 09/11/01 Admit Date:  09/09/2001   CC:         Sallee Lange, M.D.  [name]. Lia Foyer, M.D. [facility]  Cardiopulmonary Laboratory   Cardiac Catheterization  PROCEDURES PERFORMED: Cardiac catheterization.  CLINICAL HISTORY: The patient is 77 years old and has no prior history of known heart disease but does have hypertension and hyperlipidemia. He was admitted with a two-day history of chest pain and his enzymes were positive for a non-Q-wave myocardial infarction. He had minimal 0.5 mm ST elevation in the inferior leads with his pain.  DESCRIPTION OF PROCEDURE: The procedure was performed via the right femoral artery using an arterial sheath and 6 French preformed coronary catheters.  A front wall arterial puncture was performed and Omnipaque contrast was used. After completion of the diagnostic study and after reviewing the films with Dr. Lia Foyer, we made a decision to proceed with intervention on the right coronary artery and circumflex artery.  The patient was given weight-adjusted heparin to prolong the ACT to greater than 200 seconds, and was already on an Integrilin drip.  We used a JL4 7 [place] guiding catheter and a short BMW wire.  We crossed the lesion in the proximal to mid right coronary artery with the wire without difficulty.  We pre-dilated with a 3.5 x 20 mm CrossSail performing one inflation of 7 atmospheres for 30 seconds.  We then deployed at 3.5 x 23 mm Zeta stent with one inflation of 15 atmospheres for 30 seconds. Repeat diagnostic studies were then performed through the guiding catheter.  We then approached the circumflex artery.  We used a 4.0, 7 [place], Voda guiding catheter and a short BMW  wire. We crossed the lesion with the wire without difficulty. WE direct stented with a 3.0 x 15 mm Zeta stent deploying this with one inflation of 16 atmospheres for 39 seconds. The distal reference was large in the proximal reference and the area of the lesion was not quite fully expanded, so we post-dilated with a 3.5 x 8 mm PowerSail avoiding the proximal edge and covering the distal edge with one inflation of 16 atmospheres for 46 seconds.  Repeat diagnostic studies were then performed with the guiding catheter. The patient tolerated the procedure well and left the laboratory in satisfactory condition.  RESULTS: The left main coronary artery: The left main coronary artery was free of significant disease.  Left anterior descending: The left anterior descending artery gave rise to three diagonal branches and a large septal perforator.  There was 30% proximal narrowing. There was 70-80% segmental narrowing in the distal vessel with TIMI-2 flow distally. The flow improved with intracoronary verapamil.  Circumflex artery: The circumflex artery gave rise to a small AV branch and a large marginal branch which terminated into three sub-branches. There was a 90% stenosis in the mid circumflex artery right at the bifurcation of the small AV branch and the large marginal branch.  Right coronary artery: The right coronary is a large vessel that gave rise to a conus branch, right ventricular branch, a posterior descending branch, and two posterolateral branches.  There was 95% segmental stenosis in the proximal to mid vessel.  There was 30% distal stenosis.  LEFT  VENTRICULOGRAPHY: The left ventriculogram performed in the RAO projection showed good wall motion with no areas of hypokinesis.  The estimated ejection fraction was 60%.  Following stenting of the right coronary artery, the stenosis improved from 95% to less than 10%.  Following stenting of the circumflex artery, the stenosis  improved from 90% to less than 10%.  The aortic pressure was 155/79 with a mean of 111, and left ventricular pressure was 155/19.  CONCLUSIONS: 1. Coronary artery disease, status post recent non-Q-wave myocardial    infarction with 30% proximal and 70-80% distal stenosis in the left    anterior descending artery, 90% stenosis in the mid circumflex artery, 95%    stenosis in the proximal to mid right coronary artery and normal left    ventricular function. 2. Successful stent deployment in the proximal to mid right coronary artery    with improvement in percent diameter narrowing from 95% to less than 10%. 3. Successful stent deployment in the mid circumflex artery with improvement    in percent diameter narrowing from 90% to less than 10%.  DISPOSITION: The patient was returned to the postangioplasty unit for further observation. We will plan to evaluate the LAD lesion with a stress test in 2-4 weeks. Dictated by:   Vanna Scotland Olevia Perches, M.D. [facility] Attending Physician:  Wadie Lessen DD:  09/11/01 TD:  09/12/01 Job: [id number]

## 2011-04-23 NOTE — Assessment & Plan Note (Signed)
[place] OFFICE NOTE   NAME:Luke Solis, Luke Solis                        MRN:          [id number]  DATE:03/29/2007                            DOB:          [birth year]    Luke Solis is in for a followup visit.  He was recently admitted to the  hospital.  He thought he had indigestion, and it felt like indigestion,  but it was hard to distinguish.  His wife thought he was potentially  having a heart attack.  He was appropriately admitted, his enzymes ruled  out, and then he was sent to the office for a stress nuclear imaging  study.  This demonstrated good exercise capacity with no dyspnea, no  chest pain, no significant ST segment changes, and a normal myocardial  perfusion imaging study.  Ejection fraction was 68%.  The patient has  had prior percutaneous intervention in 2 vessels using a nondrug-eluting  stent technology.  He now feels well.   On his physical today, the blood pressure is 158/82.  The pulse is 85.  LUNG FIELDS:  Clear.  CARDIAC:  Rhythm is regular with a minimal systolic ejection murmur.  No  diastolic murmurs are appreciated.   IMPRESSION:  1. Coronary artery disease with prior 2-vessel coronary intervention.  2. Normal myocardial perfusion study after an episode of chest      discomfort, which the patient described, he thought, was      indigestion.  3. Hypercholesterolemia.  On lipid lowering therapy.  4. Abnormal chest x-ray thought to be due to nipple shadow, with      repeat chest x-ray with nipple markers recommended.   PLAN:  1. Repeat chest x-ray.  2. Follow up with Dr. Sallee Lange.  3. Return to clinic in 6 months.  4. We would encourage him to check his blood pressure, and follow      these up with Dr. [name] to make sure he is under optimal blood      pressure control.     Luke Solis. Lia Foyer, MD, [facility]  Electronically Signed    TDS/MedQ  DD: 03/29/2007  DT: 03/29/2007  Job #:  [id number]   cc:   Nicki Reaper A. [name], MD

## 2011-04-23 NOTE — Op Note (Signed)
NAME:  [name], [name]                           ACCOUNT NO.:  [id number]   MEDICAL RECORD NO.:  [id number]                   PATIENT TYPE:  AMB   LOCATION:  DAY                                  FACILITY:  [facility]   PHYSICIAN:  Hildred Laser, M.D.                 DATE OF BIRTH:  1931/04/10   DATE OF PROCEDURE:  11/05/2003  DATE OF DISCHARGE:                                 OPERATIVE REPORT   PROCEDURE:  Total colonoscopy with polypectomy.   INDICATIONS:  Luke Solis is a 77 year old Caucasian male who is undergoing  screening colonoscopy.  The family history is negative for colorectal  cancer.  The procedure is reviewed with the patient and informed consent was  obtained.   PREMEDICATION:  Demerol 25 mg IV, Versed 2 mg IV.   FINDINGS:  The procedure was performed in the endoscopy suite.  The  patient's vital signs and O2 saturations were monitored during the procedure  and remained stable.  The patient was placed in the left lateral decubitus  position and the rectal examination was performed.  No abnormality was noted  on external or digital exam.  The Olympus video scope was placed in the  rectum and advanced under direct vision into the sigmoid colon and beyond.  The preparation was satisfactory.  A few small scattered diverticula were  noted on the sigmoid colon.  The scope was passed to the cecum which was  identified by appendiceal orifice and ileocecal valve.  A short segment of  TI was also examined and was normal.  As the scope was withdrawn, the  colonic mucosa was carefully examined.  There was a 5-6 mm polyp at the mid  sigmoid colon which was snared and retrieved for histologic examination.  The mucosa in the rest of the sigmoid colon, rectum was normal.  The scope  was retroflexed to examine the anorectal junction and a single hemorrhoid  was noted above the dentate line.  The endoscope was straightened and  withdrawn.  The patient tolerated the procedure well.   FINAL  DIAGNOSIS:  1. Examination performed to the cecum.  2. A few diverticula at the sigmoid colon.  3. A 5-6 mm polyp snared from the sigmoid colon.  4. Internal hemorrhoids.   RECOMMENDATIONS:  Standard instructions given.  High fiber diet.  I will be  contacting Eddie with the biopsy results.  If this polyps is an adenoma, he  should consider having the next exam in five years otherwise can wait for 10  years.      ___________________________________________                                            Hildred Laser, M.D.   NR/MEDQ  D:  11/05/2003  T:  11/05/2003  Job:  [id number]   cc:   Nicki Reaper A. [name], M.D.  [address], [place]  [place] [address]  Fax: [phone]

## 2011-04-23 NOTE — Group Therapy Note (Signed)
NAMEKAVIOUS, SCHROM                 ACCOUNT NO.:  [id number]   MEDICAL RECORD NO.:  [id number]          PATIENT TYPE:  EMS   LOCATION:  ED                            FACILITY:  [facility]   PHYSICIAN:  Scott A. [name], MD    DATE OF BIRTH:  02-20-1931   DATE OF PROCEDURE:  06/05/2006  DATE OF DISCHARGE:  06/05/2006                                   PROGRESS NOTE   CHIEF COMPLAINT:  Abdominal pain and discomfort.   HISTORY OF PRESENT ILLNESS:  This 77 year old white male presents to the  emergency department with left lower quadrant abdominal pain and discomfort  present over the past 24 hours.  He relates some nausea with it.  No  diarrhea, but he did have some small stools, and one had a small amount of  blood with it.  He denies any high fever.  No vomiting, no dyspnea, no chest  pressure, and the patient denies dysuria or urinary frequency.   PAST MEDICAL HISTORY:  Hyperlipidemia, hypertension, heart disease,  neuropathy and prostate cancer.   SOCIAL HISTORY:  He does not smoke or drink.   FAMILY HISTORY:  Noncontributory.   REVIEW OF SYSTEMS:  Negative for a headache, chest pressure, shortness of  breath, joint discomfort, swelling in the legs.   MEDICATIONS:  Lipitor 80 mg daily, aspirin 325 mg daily, Zetia 10 mg daily,  __________  25 mg 2 q.h.s., lisinopril 20 mg daily, Neurontin 100 mg b.i.d.,  Toprol 50 mg 1 daily, Cardura 1 mg q.h.s., multivitamin 1 daily.   PHYSICAL EXAMINATION:  VITAL SIGNS:  BP is good, temperature afebrile.  __________  .  NECK:  Supple.  CHEST:  CTA.  No crackles.  CARDIOVASCULAR:  Heart is regular.  EXTREMITIES:  No edema.  SKIN:  Warm and dry.  GI/GU:  Prostate exam normal.  Tender in left lower quadrant area with some  guarding.  No rebound.   DIAGNOSTIC STUDIES:  A CT scan film showed inflammation in the lower  intestine with no rupture.  No abscess seen.   White count came back in the normal range with a left shift.  MET-7  acceptable.   ASSESSMENT/PLAN:  Early diverticulitis - Levaquin 500 mg IV then 500 mg  orally daily for the next 10 days.  Flagyl 500 mg t.i.d. for the next 10  days with soft diet.  Avoid seeds  and recheck for ongoing trouble.  Warning  signs discussed.  Oxycodone for pain and cautioned drowsiness.  Recheck if  problems.  Otherwise follow up in 24 hours in the office.      Scott A. [name], MD  Electronically Signed     SAL/MEDQ  D:  06/05/2006  T:  06/06/2006  Job:  [id number]

## 2011-04-23 NOTE — Assessment & Plan Note (Signed)
[place]                                 ON-CALL NOTE   ONIX, BARBERI                          MRN:          [id number]  DATE:03/13/2007                            DOB:          1931-01-03    Telephone conversation with Dr. Sallee Lange on March 13, 2007 at 1706  hours.   PRIMARY CARDIOLOGIST:  Dr. Lia Foyer.   I received an alpha page through the answering service from [phone]  concerning Luke Solis, date of birth [birth year].  The patient needs  to be admitted.  He is in Dr. Lance Sell office.  I called and spoke with  the receptionist and then spoke with Dr. [name].  Apparently, Mr. Kliethermes  presented with complaints of chest discomfort.  He has a known history  of coronary artery disease.  Dr. [name] felt the patient warranted  further evaluation and was requesting the patient be transported here  under our services.  I obtained the needed information.  The patient was  not in any acute chest pain at that time.  He did not wish to have EMS  transport him.  Dr. [name] was okay with the patient coming by private  vehicle.      Rosanne Sack, ACNP  Electronically Signed      Cristopher Estimable. Lattie Haw, MD, [facility]  Electronically Signed   MB/MedQ  DD: 03/15/2007  DT: 03/15/2007  Job #: [id number]

## 2011-04-23 NOTE — H&P (Signed)
Luke Solis, Luke Solis NO.:  [id number]   MEDICAL RECORD NO.:  [id number]          PATIENT TYPE:  EMS   LOCATION:  [facility]                         FACILITY:  [facility]   PHYSICIAN:  Satira Sark, Luke Solis DATE OF BIRTH:  05-Sep-1931   DATE OF ADMISSION:  03/13/2007  DATE OF DISCHARGE:                              HISTORY & PHYSICAL   Primary cardiologist:  Luke Brasil. Lia Foyer, Luke Solis  Primary care:  Luke Snare. [name], Luke Solis   Luke Solis is very pleasant 77 year old Caucasian gentleman with known  history of coronary artery disease, status post non Q-wave MI in  October2002, with cardiac catheterization at that time showing coronary  artery disease, patient with disease in the LAD, mid circumflex proximal  to mid right coronary artery.  He was found to have a normal ejection  fraction at that time.  He underwent stenting to the proximal to mid  right coronary artery and stenting to the mid circumflex artery.  The  patient had residual LAD disease.  He had a 30% proximal narrowing and  70-80% segmental narrowing in the distal vessel.  Following cardiac  catheterization he underwent a stress Myoview that the patient states  was stable at that time.  He followed up with Dr. Lia Solis in 2005.  At  that time the patient was getting along well.  He repeated a stress  Myoview that showed no ischemia with EF of 62%.  The patient has not  been seen by cardiology since that time.  He saw Dr. Sallee Lange, his  primary care physician, today at the insistence of his wife when he  complained of indigestion.  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  They had a group breakfast this morning.  He states he usually  eats cereal for breakfast but because of the meeting he ate bacon, eggs  and biscuits.  Went about his routine, a couple of hours later began  having some substernal chest discomfort.  It was midsternal,  localized.  He described it as a constant tightness.  He took Tums without relief.  He continued to go about his business.  The discomfort continued.  He  denied any associated symptoms.  He saw his wife this afternoon and she  thought he looked like he did not feel good.  He complained of the  localized discomfort and she insisted that he see Dr. Wolfgang Solis.  Dr.  Wolfgang Solis called earlier.  I spoke with him by phone.  He was concerned  about the patient's discomfort and felt like he needed to be evaluated.  The patient declined offer for EMS transport and his wife drove him here  to the emergency room to get evaluated.  The patient is currently in no  acute distress.   ALLERGIES:  No known drug allergies.  He does develop a cough with ACE  inhibitors.   CURRENT MEDICATIONS:  1. Lipitor 80 mg.  2. Aspirin 325 mg.  3. Zetia 10 mg.  4. Lisinopril 20  mg.  5. Toprol XL 50 mg.  6. Cardura 1 mg.  7. Neurontin 300 mg b.i.d.  8. Prilosec.   PAST MEDICAL HISTORY:  1. Coronary artery disease, status post non Q-wave MI in 2002.      Cardiac catheterization at that time with stent to the right      coronary artery and mid circumflex.  2. Hypertension.  3. Hyperlipidemia.  4. BPH.  5. Neuropathy in the lower extremities with unknown etiology.  6. Obstructive sleep apnea x2 years with compliance to CPAP.  7. Most recent Myoview in 2005 showing no ischemia with a normal      ejection fraction.   SOCIAL HISTORY:  He lives in [place] with his wife.  He is retired  but currently Development worker, community for [employer].  Denies any tobacco,  EtOH or herbal medication use.  She tries to follow a heart-healthy  diet.  He is very active.  He walks.   Mother deceased in her 10s secondary to Alzheimer's, also had a history  of MI.  Father deceased at age 29, history of coronary artery disease  status post MI.  No siblings.   REVIEW OF SYSTEMS:  Positive for chest pain, GERD as stated above.   PHYSICAL  EXAMINATION:  VITAL SIGNS:  Temperature 97.5, pulse 70,  respirations 20.  Initial blood pressure 173/88.  Repeat reading 200/87.  GENERAL:  He is in no acute distress.  HEENT:  Normocephalic, atraumatic.  Pupils equal, round, and reactive to  light.  Sclerae are clear.  NECK:  Supple without lymphadenopathy, no bruit, no JVD.  CARDIOVASCULAR:  S1 and S2, positive S4.  LUNGS:  Clear to auscultation.  ABDOMEN:  Soft, nontender, positive bowel sounds.  LOWER EXTREMITIES:  Without clubbing, cyanosis or edema.  2+ DP's  bilaterally.  NEUROLOGIC:  Alert and oriented x3.  Cranial nerves II-XII grossly  intact.   Chest x-ray showing no acute findings.  EKG:  Sinus rhythm at 73 with no  acute ST or T-wave changes.  Lab work is pending.   Dr. Johnny Bridge in to examine and assess patient with episode of  indigestion not relieved with Tums.  EKG nonspecific.  Cardiac enzymes  pending.  Now pain-free.  Will admit for observation and rule out.  If  enzymes are negative, will need cardiac catheterization.  If enzymes  negative, can discharge home and plan on outpatient stress Myoview.  Will start low-dose IV nitroglycerin, hold on anticoagulation unless  enzymes positive, continue home medications.      Rosanne Sack, ACNP      Satira Sark, Luke Solis  Electronically Signed    MB/MEDQ  D:  03/13/2007  T:  03/14/2007  Job:  [id number]

## 2011-04-23 NOTE — Procedures (Signed)
Luke Solis, Luke Solis                 ACCOUNT NO.:  [id number]   MEDICAL RECORD NO.:  [id number]          PATIENT TYPE:  OUT   LOCATION:  SLEEP LAB                     FACILITY:  [facility]   PHYSICIAN:  Danton Sewer, M.D. [facility] DATE OF BIRTH:  28-Jul-1931   DATE OF STUDY:  06/20/2005                              NOCTURNAL POLYSOMNOGRAM   REFERRING PHYSICIAN:  Dr. Sallee Lange   INDICATION FOR THE STUDY:  Persistent disorder of initiating and maintaining  wakefulness.   DATE OF STUDY:  June 20, 2005   SLEEP ARCHITECTURE:  The patient had a total sleep time of 313 minutes with  adequate slow wave sleep as well as REM. Sleep onset latency was normal as  was REM onset.   IMPRESSION:  1.  Split-night study reveals moderate obstructive sleep apnea/hypopnea      syndrome with 45 obstructive events noted in the first 138 minutes of      sleep. This gave the patient a Respiratory Disturbance Index of 20      events per hour and oxygen desaturation as low as 89%. Events were not      positional but they were associated with mild to moderate snoring. By      protocol the patient was then placed on a medium ResMed continuous      positive airway pressure mask and ultimately titrated to a pressure of 8      cm with significant improvement. There were some breakthrough events in      rapid eye movement toward the end of the study and, therefore, I would      recommend a treatment pressure of 9 cm.  2.  No clinically significant cardiac arrhythmias.  3.  Very large numbers of leg jerks with significant sleep disruption. These      occur more frequently at the beginning of the study prior to continuous      positive airway pressure initiation, however they persisted to some      degree even on continuous positive airway pressure. Clinical correlation      is suggested.     ______________________________  Gunnar Fusi    KC/MEDQ  D:  06/28/2005 15:34:08  T:  06/28/2005 22:16:33  Job:  [id number]

## 2011-09-16 LAB — CBC
MCV: 92.5
RBC: 3.87 — ABNORMAL LOW
WBC: 6.2

## 2011-09-16 LAB — DIFFERENTIAL
Eosinophils Absolute: 0.3
Lymphocytes Relative: 21
Lymphs Abs: 1.3
Monocytes Relative: 8
Neutrophils Relative %: 66

## 2011-09-16 LAB — URINALYSIS, ROUTINE W REFLEX MICROSCOPIC
Bilirubin Urine: NEGATIVE
Nitrite: NEGATIVE
Specific Gravity, Urine: 1.024
Urobilinogen, UA: 0.2
pH: 5.5

## 2011-09-16 LAB — PROTIME-INR
INR: 0.9
Prothrombin Time: 12.8

## 2011-09-16 LAB — POCT HEMOGLOBIN-HEMACUE
Hemoglobin: 13.6
Operator id: 116011

## 2011-09-16 LAB — BASIC METABOLIC PANEL
Chloride: 108
Creatinine, Ser: 1.3
GFR calc Af Amer: >60
GFR calc non Af Amer: 54 — ABNORMAL LOW
Potassium: 3.9

## 2011-12-08 DIAGNOSIS — G609 Hereditary and idiopathic neuropathy, unspecified: Secondary | ICD-10-CM | POA: Diagnosis not present

## 2011-12-09 DIAGNOSIS — E119 Type 2 diabetes mellitus without complications: Secondary | ICD-10-CM | POA: Diagnosis not present

## 2011-12-09 DIAGNOSIS — E1149 Type 2 diabetes mellitus with other diabetic neurological complication: Secondary | ICD-10-CM | POA: Diagnosis not present

## 2011-12-09 DIAGNOSIS — L600 Ingrowing nail: Secondary | ICD-10-CM | POA: Diagnosis not present

## 2012-01-18 ENCOUNTER — Encounter: Payer: Self-pay | Admitting: *Deleted

## 2012-01-21 DIAGNOSIS — H04129 Dry eye syndrome of unspecified lacrimal gland: Secondary | ICD-10-CM | POA: Diagnosis not present

## 2012-01-21 DIAGNOSIS — H01009 Unspecified blepharitis unspecified eye, unspecified eyelid: Secondary | ICD-10-CM | POA: Diagnosis not present

## 2012-01-21 DIAGNOSIS — H10409 Unspecified chronic conjunctivitis, unspecified eye: Secondary | ICD-10-CM | POA: Diagnosis not present

## 2012-01-21 DIAGNOSIS — H0230 Blepharochalasis unspecified eye, unspecified eyelid: Secondary | ICD-10-CM | POA: Diagnosis not present

## 2012-01-28 ENCOUNTER — Ambulatory Visit (INDEPENDENT_AMBULATORY_CARE_PROVIDER_SITE_OTHER): Payer: Medicare Other | Admitting: Cardiology

## 2012-01-28 ENCOUNTER — Encounter: Payer: Self-pay | Admitting: Cardiology

## 2012-01-28 VITALS — BP 140/76 | HR 56 | Ht 72.0 in | Wt 169.1 lb

## 2012-01-28 DIAGNOSIS — E785 Hyperlipidemia, unspecified: Secondary | ICD-10-CM

## 2012-01-28 DIAGNOSIS — G4733 Obstructive sleep apnea (adult) (pediatric): Secondary | ICD-10-CM

## 2012-01-28 DIAGNOSIS — I2510 Atherosclerotic heart disease of native coronary artery without angina pectoris: Secondary | ICD-10-CM | POA: Diagnosis not present

## 2012-01-28 MED ORDER — NITROGLYCERIN 0.4 MG SL SUBL: 0.4000 mg | SUBLINGUAL_TABLET | SUBLINGUAL | Status: DC | PRN

## 2012-01-28 NOTE — Patient Instructions (Signed)
Your physician wants you to follow-up in: 1 YEAR.  You will receive a reminder letter in the mail two months in advance. If you don't receive a letter, please call our office to schedule the follow-up appointment.  Your physician recommends that you continue on your current medications as directed. Please refer to the Current Medication list given to you today.  

## 2012-01-28 NOTE — Progress Notes (Signed)
   HPI:  He is doing well.  He has no chest pain, or sig cardiac symptoms.  Biggest issue seems to be peripheral neuropathy.    Current Outpatient Prescriptions  Medication Sig Dispense Refill  . aspirin 81 MG tablet Take 81 mg by mouth daily.      Marland Kitchen atorvastatin (LIPITOR) 80 MG tablet Take 80 mg by mouth daily.      . Calcium Carbonate-Vitamin D 600-400 MG-UNIT per tablet Take 1 tablet by mouth daily.      . fexofenadine (ALLEGRA) 180 MG tablet Take 180 mg by mouth as needed.      . fish oil-omega-3 fatty acids 1000 MG capsule Take 2 g by mouth daily.      Marland Kitchen losartan (COZAAR) 100 MG tablet Take 100 mg by mouth daily.      . metFORMIN (GLUCOPHAGE) 500 MG tablet Take 500 mg by mouth 2 (two) times daily with a meal.      . metoprolol tartrate (LOPRESSOR) 25 MG tablet Take 25 mg by mouth 2 (two) times daily.      . Multiple Vitamin (MULTIVITAMIN) tablet Take 1 tablet by mouth daily.      . pregabalin (LYRICA) 50 MG capsule Take 100 mg by mouth daily.       . nitroGLYCERIN (NITROSTAT) 0.4 MG SL tablet Place 1 tablet (0.4 mg total) under the tongue every 5 (five) minutes as needed for chest pain.  25 tablet  2    No Known Allergies  Past Medical History  Diagnosis Date  . CAD (coronary artery disease)   . HTN (hypertension)   . Hyperlipidemia   . Sleep apnea     obstructive  . Neuropathy   . Prostate cancer     Past Surgical History  Procedure Date  . 2-vessel coronary intervention   . Inguinal hernia repair   . Prostate surgery     Family History  Problem Relation Age of Onset  . Heart attack Mother     history of  . Coronary artery disease Father     History   Social History  . Marital Status: Married    Spouse Name: N/A    Number of Children: N/A  . Years of Education: N/A   Occupational History  . president    Social History Main Topics  . Smoking status: Never Smoker   . Smokeless tobacco: Not on file  . Alcohol Use: Not on file  . Drug Use: No  . Sexually  Active: Not on file   Other Topics Concern  . Not on file   Social History Narrative  . No narrative on file    ROS: Please see the HPI.  All other systems reviewed and negative.  PHYSICAL EXAM:  BP 140/76  Pulse 56  Ht 6' (1.829 m)  Wt 169 lb 1.9 oz (76.712 kg)  BMI 22.94 kg/m2  General: Well developed, well nourished, in no acute distress. Head:  Normocephalic and atraumatic. Neck: no JVD Lungs: Clear to auscultation and percussion. Heart: Normal S1 and S2.  No murmur, rubs or gallops.  Pulses: Pulses normal in all 4 extremities. Extremities: No clubbing or cyanosis. No edema. Neurologic: Alert and oriented x 3.  EKG:  NSR.  Delay R wave progression.  Doubt old MI. ASSESSMENT AND PLAN:

## 2012-01-28 NOTE — Assessment & Plan Note (Signed)
Now ten years out from procedure and doing well.  Denies chest pain.  Would continue medical therapy.

## 2012-01-28 NOTE — Assessment & Plan Note (Signed)
Wears CPAP

## 2012-01-28 NOTE — Assessment & Plan Note (Signed)
Managed by Dr. [name].

## 2012-02-17 DIAGNOSIS — M545 Low back pain: Secondary | ICD-10-CM | POA: Diagnosis not present

## 2012-03-09 ENCOUNTER — Ambulatory Visit (HOSPITAL_COMMUNITY)
Admission: RE | Admit: 2012-03-09 | Discharge: 2012-03-09 | Disposition: A | Discharge status: Home / Self Care | Payer: Medicare Other | Source: Ambulatory Visit | Attending: Family Medicine | Admitting: Family Medicine

## 2012-03-09 ENCOUNTER — Other Ambulatory Visit: Payer: Self-pay | Admitting: Family Medicine

## 2012-03-09 DIAGNOSIS — Z79899 Other long term (current) drug therapy: Secondary | ICD-10-CM | POA: Diagnosis not present

## 2012-03-09 DIAGNOSIS — M545 Low back pain, unspecified: Secondary | ICD-10-CM | POA: Insufficient documentation

## 2012-03-09 DIAGNOSIS — M47817 Spondylosis without myelopathy or radiculopathy, lumbosacral region: Secondary | ICD-10-CM | POA: Diagnosis not present

## 2012-03-09 DIAGNOSIS — J069 Acute upper respiratory infection, unspecified: Secondary | ICD-10-CM | POA: Diagnosis not present

## 2012-03-16 DIAGNOSIS — E1149 Type 2 diabetes mellitus with other diabetic neurological complication: Secondary | ICD-10-CM | POA: Diagnosis not present

## 2012-03-16 DIAGNOSIS — E119 Type 2 diabetes mellitus without complications: Secondary | ICD-10-CM | POA: Diagnosis not present

## 2012-04-04 DIAGNOSIS — Z125 Encounter for screening for malignant neoplasm of prostate: Secondary | ICD-10-CM | POA: Diagnosis not present

## 2012-04-17 DIAGNOSIS — C61 Malignant neoplasm of prostate: Secondary | ICD-10-CM | POA: Diagnosis not present

## 2012-07-13 DIAGNOSIS — E119 Type 2 diabetes mellitus without complications: Secondary | ICD-10-CM | POA: Diagnosis not present

## 2012-07-13 DIAGNOSIS — J029 Acute pharyngitis, unspecified: Secondary | ICD-10-CM | POA: Diagnosis not present

## 2012-08-18 DIAGNOSIS — L290 Pruritus ani: Secondary | ICD-10-CM | POA: Diagnosis not present

## 2012-08-18 DIAGNOSIS — L255 Unspecified contact dermatitis due to plants, except food: Secondary | ICD-10-CM | POA: Diagnosis not present

## 2012-09-11 DIAGNOSIS — Z23 Encounter for immunization: Secondary | ICD-10-CM | POA: Diagnosis not present

## 2012-09-11 DIAGNOSIS — C61 Malignant neoplasm of prostate: Secondary | ICD-10-CM | POA: Diagnosis not present

## 2012-09-11 DIAGNOSIS — S8490XA Injury of unspecified nerve at lower leg level, unspecified leg, initial encounter: Secondary | ICD-10-CM | POA: Diagnosis not present

## 2012-09-11 DIAGNOSIS — E785 Hyperlipidemia, unspecified: Secondary | ICD-10-CM | POA: Diagnosis not present

## 2012-09-14 DIAGNOSIS — E1149 Type 2 diabetes mellitus with other diabetic neurological complication: Secondary | ICD-10-CM | POA: Diagnosis not present

## 2012-09-14 DIAGNOSIS — E119 Type 2 diabetes mellitus without complications: Secondary | ICD-10-CM | POA: Diagnosis not present

## 2012-10-06 DIAGNOSIS — Z125 Encounter for screening for malignant neoplasm of prostate: Secondary | ICD-10-CM | POA: Diagnosis not present

## 2012-10-06 DIAGNOSIS — R5381 Other malaise: Secondary | ICD-10-CM | POA: Diagnosis not present

## 2012-10-06 DIAGNOSIS — E782 Mixed hyperlipidemia: Secondary | ICD-10-CM | POA: Diagnosis not present

## 2012-10-06 DIAGNOSIS — Z79899 Other long term (current) drug therapy: Secondary | ICD-10-CM | POA: Diagnosis not present

## 2012-10-23 DIAGNOSIS — C61 Malignant neoplasm of prostate: Secondary | ICD-10-CM | POA: Diagnosis not present

## 2012-11-07 DIAGNOSIS — H04129 Dry eye syndrome of unspecified lacrimal gland: Secondary | ICD-10-CM | POA: Diagnosis not present

## 2012-11-07 DIAGNOSIS — H2510 Age-related nuclear cataract, unspecified eye: Secondary | ICD-10-CM | POA: Diagnosis not present

## 2012-11-07 DIAGNOSIS — E119 Type 2 diabetes mellitus without complications: Secondary | ICD-10-CM | POA: Diagnosis not present

## 2012-11-20 DIAGNOSIS — G5790 Unspecified mononeuropathy of unspecified lower limb: Secondary | ICD-10-CM | POA: Diagnosis not present

## 2012-11-20 DIAGNOSIS — J069 Acute upper respiratory infection, unspecified: Secondary | ICD-10-CM | POA: Diagnosis not present

## 2013-01-16 ENCOUNTER — Ambulatory Visit (INDEPENDENT_AMBULATORY_CARE_PROVIDER_SITE_OTHER): Payer: Medicare Other | Admitting: Cardiology

## 2013-01-16 ENCOUNTER — Encounter: Payer: Self-pay | Admitting: Cardiology

## 2013-01-16 VITALS — BP 158/81 | HR 63 | Ht 74.0 in | Wt 169.0 lb

## 2013-01-16 DIAGNOSIS — E785 Hyperlipidemia, unspecified: Secondary | ICD-10-CM | POA: Diagnosis not present

## 2013-01-16 DIAGNOSIS — I2510 Atherosclerotic heart disease of native coronary artery without angina pectoris: Secondary | ICD-10-CM

## 2013-01-16 DIAGNOSIS — I10 Essential (primary) hypertension: Secondary | ICD-10-CM | POA: Diagnosis not present

## 2013-01-16 DIAGNOSIS — G4733 Obstructive sleep apnea (adult) (pediatric): Secondary | ICD-10-CM

## 2013-01-16 NOTE — Assessment & Plan Note (Signed)
Borderline elevation. Should continue to monitor

## 2013-01-16 NOTE — Assessment & Plan Note (Signed)
Under the care of Dr. [name].

## 2013-01-16 NOTE — Assessment & Plan Note (Signed)
The patient has prior BMS to the RCA and CFX, and is stable.  This was done twelve years ago.  He remains stable at the present time.  No new treatment plans.  Will have him follow up with Dr. Domenic Polite in the [place] OP clinic.

## 2013-01-16 NOTE — Patient Instructions (Signed)
Your physician recommends that you schedule a follow-up appointment in: 6 MONTHS to 1 YEAR with Dr Domenic Polite in the [place] office  Your physician recommends that you continue on your current medications as directed. Please refer to the Current Medication list given to you today.

## 2013-01-16 NOTE — Assessment & Plan Note (Signed)
Uses CPAP at night

## 2013-01-16 NOTE — Progress Notes (Signed)
HPI:  This very nice patient is seen today in a followup visit. From a cardiac standpoint, he is not having any major problems. He does continue to have problems with neuropathy, and he does use his CPAP machine at home and we discussed this to some extent today.  There are  no new cardiac symptoms.  He is getting close to retirement for [employer].    Current Outpatient Prescriptions  Medication Sig Dispense Refill  . aspirin 81 MG tablet Take 81 mg by mouth daily.      Marland Kitchen atorvastatin (LIPITOR) 80 MG tablet Take 80 mg by mouth daily.      . Calcium Carbonate-Vitamin D 600-400 MG-UNIT per tablet Take 1 tablet by mouth daily.      . fexofenadine (ALLEGRA) 180 MG tablet Take 180 mg by mouth as needed.      . fish oil-omega-3 fatty acids 1000 MG capsule Take 2 g by mouth daily.      Marland Kitchen losartan (COZAAR) 100 MG tablet Take 100 mg by mouth daily.      . metFORMIN (GLUCOPHAGE) 500 MG tablet Take 500 mg by mouth 2 (two) times daily with a meal.      . metoprolol tartrate (LOPRESSOR) 25 MG tablet Take 25 mg by mouth 2 (two) times daily.      . Multiple Vitamin (MULTIVITAMIN) tablet Take 1 tablet by mouth daily.      . nitroGLYCERIN (NITROSTAT) 0.4 MG SL tablet Place 1 tablet (0.4 mg total) under the tongue every 5 (five) minutes as needed for chest pain.  25 tablet  2  . pregabalin (LYRICA) 100 MG capsule Take 100 mg in the morning and 300 mg in the evening as directed      . hydrochlorothiazide (HYDRODIURIL) 25 MG tablet Take 1/2 tablet in the morning       No current facility-administered medications for this visit.    No Known Allergies  Past Medical History  Diagnosis Date  . CAD (coronary artery disease)   . HTN (hypertension)   . Hyperlipidemia   . Sleep apnea     obstructive  . Neuropathy   . Prostate cancer     Past Surgical History  Procedure Laterality Date  . 2-vessel coronary intervention    . Inguinal hernia repair    . Prostate surgery      Family History    Problem Relation Age of Onset  . Heart attack Mother     history of  . Coronary artery disease Father     History   Social History  . Marital Status: Married    Spouse Name: N/A    Number of Children: N/A  . Years of Education: N/A   Occupational History  . president    Social History Main Topics  . Smoking status: Never Smoker   . Smokeless tobacco: Not on file  . Alcohol Use: Not on file  . Drug Use: No  . Sexually Active: Not on file   Other Topics Concern  . Not on file   Social History Narrative  . No narrative on file    ROS: Please see the HPI.  All other systems reviewed and negative.  PHYSICAL EXAM:  BP 158/81  Pulse 63  Ht 6\' 2"  (1.88 m)  Wt 169 lb (76.658 kg)  BMI 21.69 kg/m2  SpO2 96%  General: Well developed, well nourished, in no acute distress. Head:  Normocephalic and atraumatic. Neck: no JVD Lungs: Clear to  auscultation and percussion. Heart: Normal S1 and S2.  Minimal SEM.  No DM.   Abdomen:  Normal bowel sounds; soft; non tender; no organomegaly Pulses: Pulses normal in all 4 extremities. Extremities: No clubbing or cyanosis. No edema. Neurologic: Alert and oriented x 3.  EKG:  NSR.  Borderline first degree av block.   Leftward axis.  ASSESSMENT AND PLAN:

## 2013-01-26 DIAGNOSIS — L02219 Cutaneous abscess of trunk, unspecified: Secondary | ICD-10-CM | POA: Diagnosis not present

## 2013-01-26 DIAGNOSIS — L0291 Cutaneous abscess, unspecified: Secondary | ICD-10-CM | POA: Diagnosis not present

## 2013-01-26 DIAGNOSIS — L0390 Cellulitis, unspecified: Secondary | ICD-10-CM | POA: Diagnosis not present

## 2013-01-29 DIAGNOSIS — R972 Elevated prostate specific antigen [PSA]: Secondary | ICD-10-CM | POA: Diagnosis not present

## 2013-02-24 ENCOUNTER — Other Ambulatory Visit: Payer: Self-pay | Admitting: *Deleted

## 2013-02-24 MED ORDER — ATORVASTATIN CALCIUM 80 MG PO TABS: 80.0000 mg | ORAL_TABLET | Freq: Every day | ORAL | Status: DC

## 2013-02-24 MED ORDER — LOSARTAN POTASSIUM 100 MG PO TABS: 100.0000 mg | ORAL_TABLET | Freq: Every day | ORAL | Status: DC

## 2013-03-07 ENCOUNTER — Other Ambulatory Visit: Payer: Self-pay | Admitting: Family Medicine

## 2013-03-27 ENCOUNTER — Other Ambulatory Visit: Payer: Self-pay | Admitting: Family Medicine

## 2013-04-23 ENCOUNTER — Other Ambulatory Visit: Payer: Self-pay | Admitting: Family Medicine

## 2013-05-07 DIAGNOSIS — C61 Malignant neoplasm of prostate: Secondary | ICD-10-CM | POA: Diagnosis not present

## 2013-05-23 ENCOUNTER — Encounter: Payer: Self-pay | Admitting: *Deleted

## 2013-06-11 ENCOUNTER — Other Ambulatory Visit: Payer: Self-pay | Admitting: Family Medicine

## 2013-06-21 ENCOUNTER — Other Ambulatory Visit: Payer: Self-pay | Admitting: Family Medicine

## 2013-07-09 ENCOUNTER — Other Ambulatory Visit: Payer: Self-pay | Admitting: Family Medicine

## 2013-07-16 ENCOUNTER — Other Ambulatory Visit: Payer: Self-pay | Admitting: Family Medicine

## 2013-07-17 ENCOUNTER — Ambulatory Visit (INDEPENDENT_AMBULATORY_CARE_PROVIDER_SITE_OTHER): Payer: Medicare Other | Admitting: Family Medicine

## 2013-07-17 ENCOUNTER — Encounter: Payer: Self-pay | Admitting: Family Medicine

## 2013-07-17 VITALS — BP 152/84 | Ht 74.0 in | Wt 169.4 lb

## 2013-07-17 DIAGNOSIS — E1140 Type 2 diabetes mellitus with diabetic neuropathy, unspecified: Secondary | ICD-10-CM | POA: Insufficient documentation

## 2013-07-17 DIAGNOSIS — E119 Type 2 diabetes mellitus without complications: Secondary | ICD-10-CM

## 2013-07-17 MED ORDER — METFORMIN HCL 500 MG PO TABS: 1000.0000 mg | ORAL_TABLET | Freq: Two times a day (BID) | ORAL | Status: DC

## 2013-07-17 NOTE — Progress Notes (Signed)
  Subjective:    Patient ID: Luke Solis, male    DOB: [birth year], 79 y.o.   MRN: [id number]  HPI  Results for orders placed in visit on 07/17/13  POCT GLYCOSYLATED HEMOGLOBIN (HGB A1C)      Result Value Range   Hemoglobin A1C 6.9     Patient notes elevated sugars past several weeks. Compliant with metformin. Known diagnosis of type 2 diabetes. No polyuria polydipsia polyphasia  Review of Systems  ROS otherwise negative.    Objective:   Physical Exam Alert no acute distress. Lungs clear. Heart regular in rhythm. HEENT normal.        Assessment & Plan:  Impression #1 type 2 diabetes control suboptimal in. Plan increase metformin to 2 tablets twice a day. Followup with Dr. Nicki Reaper in one month for chronic problem visit for multiple medical problems such as hypertension hyperlipidemia a followup of diabetes etc.. WSL

## 2013-07-17 NOTE — Patient Instructions (Signed)
Take two metformins twice per day

## 2013-07-19 DIAGNOSIS — E119 Type 2 diabetes mellitus without complications: Secondary | ICD-10-CM | POA: Diagnosis not present

## 2013-07-19 DIAGNOSIS — E1149 Type 2 diabetes mellitus with other diabetic neurological complication: Secondary | ICD-10-CM | POA: Diagnosis not present

## 2013-08-13 DIAGNOSIS — C61 Malignant neoplasm of prostate: Secondary | ICD-10-CM | POA: Diagnosis not present

## 2013-08-15 ENCOUNTER — Encounter: Payer: Self-pay | Admitting: Family Medicine

## 2013-08-15 ENCOUNTER — Ambulatory Visit (INDEPENDENT_AMBULATORY_CARE_PROVIDER_SITE_OTHER): Payer: Medicare Other | Admitting: Family Medicine

## 2013-08-15 VITALS — BP 120/78 | Ht 74.0 in | Wt 165.6 lb

## 2013-08-15 DIAGNOSIS — I10 Essential (primary) hypertension: Secondary | ICD-10-CM | POA: Diagnosis not present

## 2013-08-15 DIAGNOSIS — G4733 Obstructive sleep apnea (adult) (pediatric): Secondary | ICD-10-CM | POA: Diagnosis not present

## 2013-08-15 DIAGNOSIS — E119 Type 2 diabetes mellitus without complications: Secondary | ICD-10-CM | POA: Diagnosis not present

## 2013-08-15 DIAGNOSIS — E785 Hyperlipidemia, unspecified: Secondary | ICD-10-CM

## 2013-08-15 DIAGNOSIS — G589 Mononeuropathy, unspecified: Secondary | ICD-10-CM | POA: Diagnosis not present

## 2013-08-15 NOTE — Patient Instructions (Signed)
Am readings ideal 90-120  2 hours after meal 140 to 160  Metformin 500 one 3 times per day, if side effects go back to 2 and call

## 2013-08-15 NOTE — Progress Notes (Signed)
  Subjective:    Patient ID: Luke Solis, male    DOB: [birth year], 79 y.o.   MRN: [id number]  Diabetes He presents for his follow-up diabetic visit. He has type 2 diabetes mellitus. Current diabetic treatment includes oral agent (monotherapy).  The patient was seen today as part of a comprehensive diabetic check up. The patient had the following elements completed: -Review of medication compliance -Review of glucose monitoring results -Review of any complications do to high or low sugars -Diabetic foot exam was completed as part of today's visit. The following was also discussed: -Importance of yearly eye exams -Importance of following diabetic/low sugar-starch diet -Importance of exercise and regular activity -Importance of regular followup visits. -Most recent hemoglobin A1c were reviewed with the patient along with goals regarding diabetes. Patient denies excessive thirst urination. He denies nausea or vomiting he does have significant neuropathy of the feet. Patient relates compliance with medication. Denies any chest tightness pressure pain He does get yearly eye exams He does see urology as regular followup He did not tolerate for Glucophage as per day. See above. Family history noncontributory patient does not smoke lives with his wife self-sufficient  Patient does relate that when he uses CPAP machine he wakes up in the morning sometimes mask is half way off and he is technique is in his mouth Review of Systems see above     Objective:   Physical Exam Neck no masses lungs clear no crackles heart is regular pulse normal abdomen is soft no tenderness extremities no edema diabetic foot exam was done       Assessment & Plan:  cpap - humidifier, and this should help with the mucus. Yard he hasn't humidifier he will just start using it HTN good control currently Hyperlipidemia check labs Dm increase met 3 times day, if he doesn't tolerate metformin is possible he may need  extended release metformin or possibly Janumet  Lab work ordered. Await results. Followup in approximately 4 months.

## 2013-08-17 ENCOUNTER — Other Ambulatory Visit: Payer: Self-pay | Admitting: Family Medicine

## 2013-08-21 ENCOUNTER — Other Ambulatory Visit: Payer: Self-pay

## 2013-08-21 MED ORDER — PREGABALIN 100 MG PO CAPS: ORAL_CAPSULE | ORAL | Status: DC

## 2013-09-12 ENCOUNTER — Encounter: Payer: Self-pay | Admitting: Family Medicine

## 2013-09-12 ENCOUNTER — Other Ambulatory Visit: Payer: Self-pay | Admitting: Family Medicine

## 2013-09-12 ENCOUNTER — Ambulatory Visit (INDEPENDENT_AMBULATORY_CARE_PROVIDER_SITE_OTHER): Payer: Medicare Other | Admitting: Family Medicine

## 2013-09-12 VITALS — BP 122/72 | Temp 98.4°F | Ht 74.0 in | Wt 162.4 lb

## 2013-09-12 DIAGNOSIS — R310 Gross hematuria: Secondary | ICD-10-CM | POA: Diagnosis not present

## 2013-09-12 DIAGNOSIS — R300 Dysuria: Secondary | ICD-10-CM

## 2013-09-12 LAB — POCT URINALYSIS DIPSTICK: pH, UA: 7

## 2013-09-12 NOTE — Progress Notes (Signed)
  Subjective:    Patient ID: Luke Solis, male    DOB: [birth year], 79 y.o.   MRN: [id number]  Hematuria This is a new problem. The current episode started yesterday. He describes the hematuria as microscopic hematuria. He is experiencing no pain. He describes his urine color as light pink.   Patient has a history of prostate cancer. He had local radiation seeding a number of years ago. Followed closely by Dr. Rosana Hoes. His PSA has risen slightly of late.  No abdominal pain no back pain no chills no fever. No dysuria except during the peak of hematuria yesterday.  No history of kidney stones.  No history of prior hematuria.   Review of Systems  Genitourinary: Positive for hematuria.   ROS otherwise negative     Objective:   Physical Exam  Alert no acute distress. HEENT normal. Lungs clear. Heart regular rate and rhythm. Abdomen soft. Prostate region no palpable abnormality. Low abdomen nontender.  Urinalysis numerous red blood cells no white blood cells      Assessment & Plan:  Impression gross hematuria with history of known prostate cancer. Also of note PSA rising of late. Ramifications discussed with patient and spouse. Needs further workup for sure. They have good working relationship with his urologist and they will call him tomorrow and get this initiated. 25 minutes spent most in discussion. WSL

## 2013-09-20 DIAGNOSIS — R310 Gross hematuria: Secondary | ICD-10-CM | POA: Diagnosis not present

## 2013-09-20 DIAGNOSIS — R319 Hematuria, unspecified: Secondary | ICD-10-CM | POA: Diagnosis not present

## 2013-09-20 DIAGNOSIS — C61 Malignant neoplasm of prostate: Secondary | ICD-10-CM | POA: Diagnosis not present

## 2013-10-08 ENCOUNTER — Other Ambulatory Visit: Payer: Self-pay | Admitting: Family Medicine

## 2013-11-05 ENCOUNTER — Other Ambulatory Visit: Payer: Self-pay | Admitting: Family Medicine

## 2013-11-12 DIAGNOSIS — C61 Malignant neoplasm of prostate: Secondary | ICD-10-CM | POA: Diagnosis not present

## 2013-11-12 DIAGNOSIS — R972 Elevated prostate specific antigen [PSA]: Secondary | ICD-10-CM | POA: Insufficient documentation

## 2013-11-13 DIAGNOSIS — H43819 Vitreous degeneration, unspecified eye: Secondary | ICD-10-CM | POA: Diagnosis not present

## 2013-11-13 DIAGNOSIS — H40019 Open angle with borderline findings, low risk, unspecified eye: Secondary | ICD-10-CM | POA: Diagnosis not present

## 2013-11-13 DIAGNOSIS — H2510 Age-related nuclear cataract, unspecified eye: Secondary | ICD-10-CM | POA: Diagnosis not present

## 2013-11-13 DIAGNOSIS — E119 Type 2 diabetes mellitus without complications: Secondary | ICD-10-CM | POA: Diagnosis not present

## 2013-11-13 DIAGNOSIS — H04129 Dry eye syndrome of unspecified lacrimal gland: Secondary | ICD-10-CM | POA: Diagnosis not present

## 2013-11-13 DIAGNOSIS — H0230 Blepharochalasis unspecified eye, unspecified eyelid: Secondary | ICD-10-CM | POA: Diagnosis not present

## 2013-12-27 ENCOUNTER — Other Ambulatory Visit: Payer: Self-pay | Admitting: Family Medicine

## 2014-01-25 ENCOUNTER — Other Ambulatory Visit: Payer: Self-pay | Admitting: *Deleted

## 2014-01-25 MED ORDER — METFORMIN HCL ER 500 MG PO TB24: ORAL_TABLET | ORAL | Status: DC

## 2014-02-28 ENCOUNTER — Other Ambulatory Visit: Payer: Self-pay | Admitting: Family Medicine

## 2014-03-01 ENCOUNTER — Other Ambulatory Visit: Payer: Self-pay | Admitting: *Deleted

## 2014-03-01 MED ORDER — PREGABALIN 100 MG PO CAPS: ORAL_CAPSULE | ORAL | Status: DC

## 2014-03-12 ENCOUNTER — Telehealth: Payer: Self-pay | Admitting: Family Medicine

## 2014-03-12 DIAGNOSIS — E119 Type 2 diabetes mellitus without complications: Secondary | ICD-10-CM

## 2014-03-12 DIAGNOSIS — E782 Mixed hyperlipidemia: Secondary | ICD-10-CM

## 2014-03-12 DIAGNOSIS — Z79899 Other long term (current) drug therapy: Secondary | ICD-10-CM

## 2014-03-12 NOTE — Telephone Encounter (Signed)
Discussed with patient bloodwork orders are ready. Pt can go over to lab fasting.

## 2014-03-12 NOTE — Telephone Encounter (Signed)
bw orders please for appt 4/14  Pt states he does not need an PSA this time

## 2014-03-12 NOTE — Telephone Encounter (Signed)
Lipid, liver, bmp, urine microalbumin was ordered 9/14 and cancelled. What would you like to order?

## 2014-03-12 NOTE — Telephone Encounter (Signed)
Yes, he needs all of these tests plus hemoglobin A1c. The reason that these were canceled as he did not follow through with getting them done. He needs to do these labs and do a followup office visit.

## 2014-03-13 DIAGNOSIS — E782 Mixed hyperlipidemia: Secondary | ICD-10-CM | POA: Diagnosis not present

## 2014-03-13 DIAGNOSIS — Z79899 Other long term (current) drug therapy: Secondary | ICD-10-CM | POA: Diagnosis not present

## 2014-03-13 DIAGNOSIS — E119 Type 2 diabetes mellitus without complications: Secondary | ICD-10-CM | POA: Diagnosis not present

## 2014-03-13 LAB — LIPID PANEL
CHOL/HDL RATIO: 2.8 ratio
Cholesterol: 122 mg/dL (ref 0–200)
HDL: 43 mg/dL (ref >39)
LDL CALC: 67 mg/dL (ref 0–99)
Triglycerides: 59 mg/dL (ref <150)
VLDL: 12 mg/dL (ref 0–40)

## 2014-03-13 LAB — BASIC METABOLIC PANEL
BUN: 21 mg/dL (ref 6–23)
CHLORIDE: 105 meq/L (ref 96–112)
CO2: 29 meq/L (ref 19–32)
Calcium: 9.2 mg/dL (ref 8.4–10.5)
Creat: 0.98 mg/dL (ref 0.50–1.35)
Glucose, Bld: 114 mg/dL — ABNORMAL HIGH (ref 70–99)
Potassium: 4.1 mEq/L (ref 3.5–5.3)
SODIUM: 143 meq/L (ref 135–145)

## 2014-03-13 LAB — HEMOGLOBIN A1C
Hgb A1c MFr Bld: 6.9 % — ABNORMAL HIGH (ref <5.7)
Mean Plasma Glucose: 151 mg/dL — ABNORMAL HIGH (ref <117)

## 2014-03-13 LAB — HEPATIC FUNCTION PANEL
ALT: 19 U/L (ref 0–53)
AST: 19 U/L (ref 0–37)
Albumin: 4.1 g/dL (ref 3.5–5.2)
Alkaline Phosphatase: 64 U/L (ref 39–117)
BILIRUBIN DIRECT: 0.1 mg/dL (ref 0.0–0.3)
BILIRUBIN TOTAL: 0.6 mg/dL (ref 0.2–1.2)
Indirect Bilirubin: 0.5 mg/dL (ref 0.2–1.2)
Total Protein: 6.4 g/dL (ref 6.0–8.3)

## 2014-03-14 LAB — MICROALBUMIN, URINE: MICROALB UR: 3.18 mg/dL — AB (ref 0.00–1.89)

## 2014-03-19 ENCOUNTER — Encounter: Payer: Self-pay | Admitting: Family Medicine

## 2014-03-19 ENCOUNTER — Ambulatory Visit (INDEPENDENT_AMBULATORY_CARE_PROVIDER_SITE_OTHER): Payer: Medicare Other | Admitting: Family Medicine

## 2014-03-19 VITALS — BP 110/70 | Ht 74.0 in | Wt 165.1 lb

## 2014-03-19 DIAGNOSIS — M545 Low back pain, unspecified: Secondary | ICD-10-CM

## 2014-03-19 DIAGNOSIS — E119 Type 2 diabetes mellitus without complications: Secondary | ICD-10-CM

## 2014-03-19 DIAGNOSIS — Z86010 Personal history of colonic polyps: Secondary | ICD-10-CM

## 2014-03-19 DIAGNOSIS — E785 Hyperlipidemia, unspecified: Secondary | ICD-10-CM

## 2014-03-19 DIAGNOSIS — G589 Mononeuropathy, unspecified: Secondary | ICD-10-CM | POA: Diagnosis not present

## 2014-03-19 DIAGNOSIS — I10 Essential (primary) hypertension: Secondary | ICD-10-CM

## 2014-03-19 DIAGNOSIS — G4733 Obstructive sleep apnea (adult) (pediatric): Secondary | ICD-10-CM

## 2014-03-19 NOTE — Progress Notes (Signed)
   Subjective:    Patient ID: Luke Solis, male    DOB: March 29, 1931, 80 y.o.   MRN: [id number]  Diabetes He presents for his follow-up diabetic visit. He has type 2 diabetes mellitus. His disease course has been stable. There are no hypoglycemic associated symptoms. There are no diabetic associated symptoms. There are no hypoglycemic complications. Symptoms are stable. There are no diabetic complications. There are no known risk factors for coronary artery disease. Current diabetic treatment includes oral agent (monotherapy). He is compliant with treatment all of the time.  Patient states that his diabetic neuropathy is getting worse. Patient also states that his blood sugars have been running off. Needs a referral to a new cardiologist. Discuss problems with sleep apnea. Discuss colonoscopy (Dr. Laural Golden). Bloodwork results in the system.   greater and 25 minutes spent with patient discussing these issues. Significant time spent with patient discussing his sleep apnea machine is not working well for him having a difficult time finding a mask need to see specialists. Having difficult time dietary measures will need see nutritionist.    Review of Systems Denies headache sweats chills nausea vomHe iting relates the pain. Denies wheezing difficulty breathing rectal bleeding hematuria denies headaches fever chills or change in appetite. Denies depression.    Objective:   Physical Exam Lungs clear heart regular pulses normal extremities no edema skin warm dry abdomen soft diabetic foot exam abnormal seen notes       Assessment & Plan:  #1 patient relates that he does want to have preventative colonoscopy completed by Dr. Laural Golden. This patient overall health is good. Denies rectal bleeding. #2 diabetes decent control continue current measures. Continue the extended release metformin. Recheck this again in 4-6 months #3 diabetic neuropathy Lyrica he'll take one in the morning and 3 in the evening and  this should help him. #4 having significant trouble with his sleep apnea having difficult time with the mask I recommend we get him set with the specialist regarding this #5 he does need nutritional counseling  Having lumbar pain has history prostate cancer elevated PSA we will do x-ray of his back if that gets worse she'll need a bone scan or MRI he understands this.

## 2014-03-20 ENCOUNTER — Telehealth (HOSPITAL_COMMUNITY): Payer: Self-pay | Admitting: Dietician

## 2014-03-20 ENCOUNTER — Ambulatory Visit (HOSPITAL_COMMUNITY)
Admission: RE | Admit: 2014-03-20 | Discharge: 2014-03-20 | Disposition: A | Discharge status: Home / Self Care | Payer: Medicare Other | Source: Ambulatory Visit | Attending: Family Medicine | Admitting: Family Medicine

## 2014-03-20 ENCOUNTER — Encounter (INDEPENDENT_AMBULATORY_CARE_PROVIDER_SITE_OTHER): Payer: Self-pay | Admitting: *Deleted

## 2014-03-20 DIAGNOSIS — M545 Low back pain, unspecified: Secondary | ICD-10-CM | POA: Diagnosis not present

## 2014-03-20 DIAGNOSIS — M51379 Other intervertebral disc degeneration, lumbosacral region without mention of lumbar back pain or lower extremity pain: Secondary | ICD-10-CM | POA: Insufficient documentation

## 2014-03-20 DIAGNOSIS — M5137 Other intervertebral disc degeneration, lumbosacral region: Secondary | ICD-10-CM | POA: Insufficient documentation

## 2014-03-20 DIAGNOSIS — Z8546 Personal history of malignant neoplasm of prostate: Secondary | ICD-10-CM | POA: Diagnosis not present

## 2014-03-20 NOTE — Telephone Encounter (Signed)
Sent letter to pt home via US Mail in attempt to contact pt to schedule appointment.  

## 2014-03-20 NOTE — Telephone Encounter (Signed)
Message copied by Domenick Bookbinder on Wed Mar 20, 2014  2:35 PM ------      Message from: Lisa Roca E      Created: Wed Mar 20, 2014  1:42 PM      Regarding: referral       Dr. Sallee Lange would like pt seen for diabetes meal education       THANKS  ------

## 2014-03-21 ENCOUNTER — Telehealth: Payer: Self-pay | Admitting: Family Medicine

## 2014-03-21 NOTE — Telephone Encounter (Signed)
Pt's LYRICA was denied, plan only allows 3 times a day dosing, please see denial on paper chart, please advise

## 2014-03-23 ENCOUNTER — Other Ambulatory Visit: Payer: Self-pay | Admitting: Family Medicine

## 2014-03-24 NOTE — Telephone Encounter (Signed)
Unfortunately the patient's insurance company does not allow him to get 4 capsules per day. He has options of sticking with 100 mg 3 per day or go to toward 200 mg taken twice per day. Please discuss with him which he would like to let the nail and I be happy to do it.

## 2014-03-26 NOTE — Telephone Encounter (Signed)
[facility] for pt

## 2014-03-28 NOTE — Telephone Encounter (Signed)
No response from previous contact attempt. Sent letter to pt home via US Mail in attempt to contact pt to schedule appointment.  

## 2014-04-01 ENCOUNTER — Encounter: Payer: Self-pay | Admitting: Family Medicine

## 2014-04-04 NOTE — Telephone Encounter (Signed)
Pt has not responded to attempts to contact to schedule appointment. Referral filed.  

## 2014-04-09 NOTE — Telephone Encounter (Signed)
Spoke with pt, explained issue with his Lyrica, pt would like to stay at 100mg , 3 capsules a day, (1 in the morning & 2 at night) pt states if this doesn't seem to work well, he will see Dr. Nicki Reaper to discuss other options

## 2014-04-13 DIAGNOSIS — T148 Other injury of unspecified body region: Secondary | ICD-10-CM | POA: Diagnosis not present

## 2014-04-13 DIAGNOSIS — W57XXXA Bitten or stung by nonvenomous insect and other nonvenomous arthropods, initial encounter: Secondary | ICD-10-CM | POA: Diagnosis not present

## 2014-04-19 ENCOUNTER — Ambulatory Visit: Payer: Medicare Other | Admitting: Cardiology

## 2014-04-30 ENCOUNTER — Institutional Professional Consult (permissible substitution): Payer: Medicare Other | Admitting: Pulmonary Disease

## 2014-05-09 ENCOUNTER — Ambulatory Visit (INDEPENDENT_AMBULATORY_CARE_PROVIDER_SITE_OTHER): Payer: Medicare Other | Admitting: Pulmonary Disease

## 2014-05-09 ENCOUNTER — Encounter: Payer: Self-pay | Admitting: Pulmonary Disease

## 2014-05-09 ENCOUNTER — Encounter (INDEPENDENT_AMBULATORY_CARE_PROVIDER_SITE_OTHER): Payer: Self-pay

## 2014-05-09 VITALS — BP 130/80 | HR 56 | Temp 98.0°F | Ht 74.0 in | Wt 164.0 lb

## 2014-05-09 DIAGNOSIS — G4733 Obstructive sleep apnea (adult) (pediatric): Secondary | ICD-10-CM

## 2014-05-09 NOTE — Assessment & Plan Note (Signed)
The patient is wearing his CPAP device, but is having significant mask leak and breakthrough apnea by his download. We need to focus on getting a good mask fit, and also optimizing his pressure.  Will refer him to the sleep Center for a formal mask fitting, and we'll also set his machine on the automatic setting for the next 4-8 weeks. I will see him back at that time to check on progress.

## 2014-05-09 NOTE — Patient Instructions (Addendum)
Will refer you to the sleep center for a mask fitting. Will set your machine on the auto setting and leave there, to see how you do.  Followup with me in 8 weeks to check on progress.

## 2014-05-09 NOTE — Progress Notes (Signed)
Subjective:    Patient ID: Luke Solis, male    DOB: 1931-03-12, 80 y.o.   MRN: [id number]  HPI The patient is an 80 year old male who I've been asked to see for management of obstructive sleep apnea. The patient was diagnosed in 2006 with mild to moderate OSA, with an AHI of 20 events per hour. He was started on CPAP with a reasonable response, but has had great difficulty all along with mask fitting. He is currently using a full face mask, and has a lot of leaks. From questioning, it does not sound like he keeps up with his cushion changes, but overall he is not happy with the fit. He will often awaken during the night with mask leaking, and will ultimately find it in the floor.  The patient does not feel completely rested in the mornings upon arising, and notes definite sleep pressure during the day with inactivity. His Epworth score today is very abnormal at 14. He tells me that his weight has been stable since his initial sleep study. His download today shows his CPAP pressure to be 10 cm, but he has severe mask leak and breakthrough apneas.   Sleep Questionnaire What time do you typically go to bed?( Between what hours) 10-11pm 10-11pm at 1040 on 05/09/14 by Lilli Few, CMA How long does it take you to fall asleep? 10 minutes 10 minutes at 1040 on 05/09/14 by Lilli Few, CMA How many times during the night do you wake up? 1 1 at 1040 on 05/09/14 by Lilli Few, CMA What time do you get out of bed to start your day? 0600 0600 at 1040 on 05/09/14 by Lilli Few, CMA Do you drive or operate heavy machinery in your occupation? No No at 1040 on 05/09/14 by Lilli Few, CMA How much has your weight changed (up or down) over the past two years? (In pounds) 0 oz (0 kg) 0 oz (0 kg) at 1040 on 05/09/14 by Lilli Few, CMA Have you ever had a sleep study before? Yes Yes at 1040 on 05/09/14 by Lilli Few, CMA If yes, location of  study? [place] at 1040 on 05/09/14 by Lilli Few, CMA If yes, date of study? Do you currently use CPAP? Yes Yes at 1040 on 05/09/14 by Lilli Few, CMA If so, what pressure? unknown unknown at 1040 on 05/09/14 by Lilli Few, CMA Do you wear oxygen at any time? No    Review of Systems  Constitutional: Negative for fever and unexpected weight change.  HENT: Positive for dental problem. Negative for congestion, ear pain, nosebleeds, postnasal drip, rhinorrhea, sinus pressure, sneezing, sore throat and trouble swallowing.   Eyes: Negative for redness and itching.  Respiratory: Negative for cough, chest tightness, shortness of breath and wheezing.   Cardiovascular: Negative for palpitations and leg swelling.  Gastrointestinal: Negative for nausea and vomiting.  Genitourinary: Negative for dysuria.  Musculoskeletal: Negative for joint swelling.  Skin: Negative for rash.  Neurological: Negative for headaches.  Hematological: Does not bruise/bleed easily.  Psychiatric/Behavioral: Negative for dysphoric mood. The patient is not nervous/anxious.        Objective:   Physical Exam Constitutional:  Well developed, no acute distress  HENT:  Nares patent without discharge, narrowed bilat  Oropharynx without exudate, palate and uvula are mildly elongated.   Eyes:  Perrla, eomi, no scleral icterus  Neck:  No JVD, no TMG  Cardiovascular:  Normal rate,  regular rhythm, no rubs or gallops.  No murmurs        Intact distal pulses  Pulmonary :  Normal breath sounds, no stridor or respiratory distress   No rales, rhonchi, or wheezing  Abdominal:  Soft, nondistended, bowel sounds present.  No tenderness noted.   Musculoskeletal:  No lower extremity edema noted.  Lymph Nodes:  No cervical lymphadenopathy noted  Skin:  No cyanosis noted  Neurologic:  Alert, appropriate, moves all 4 extremities without obvious deficit.         Assessment &  Plan:

## 2014-05-14 ENCOUNTER — Ambulatory Visit (HOSPITAL_BASED_OUTPATIENT_CLINIC_OR_DEPARTMENT_OTHER): Payer: Medicare Other | Attending: Pulmonary Disease | Admitting: Radiology

## 2014-05-14 DIAGNOSIS — Z9989 Dependence on other enabling machines and devices: Principal | ICD-10-CM

## 2014-05-14 DIAGNOSIS — G4733 Obstructive sleep apnea (adult) (pediatric): Secondary | ICD-10-CM

## 2014-05-20 DIAGNOSIS — R972 Elevated prostate specific antigen [PSA]: Secondary | ICD-10-CM | POA: Diagnosis not present

## 2014-05-20 DIAGNOSIS — C61 Malignant neoplasm of prostate: Secondary | ICD-10-CM | POA: Diagnosis not present

## 2014-06-18 ENCOUNTER — Encounter: Payer: Self-pay | Admitting: Cardiology

## 2014-06-19 ENCOUNTER — Encounter: Payer: Self-pay | Admitting: Cardiology

## 2014-06-19 ENCOUNTER — Ambulatory Visit (INDEPENDENT_AMBULATORY_CARE_PROVIDER_SITE_OTHER): Payer: Medicare Other | Admitting: Cardiology

## 2014-06-19 VITALS — BP 132/82 | HR 61 | Ht 74.0 in | Wt 164.0 lb

## 2014-06-19 DIAGNOSIS — I10 Essential (primary) hypertension: Secondary | ICD-10-CM

## 2014-06-19 DIAGNOSIS — E1142 Type 2 diabetes mellitus with diabetic polyneuropathy: Secondary | ICD-10-CM

## 2014-06-19 DIAGNOSIS — I2510 Atherosclerotic heart disease of native coronary artery without angina pectoris: Secondary | ICD-10-CM

## 2014-06-19 DIAGNOSIS — E785 Hyperlipidemia, unspecified: Secondary | ICD-10-CM

## 2014-06-19 DIAGNOSIS — E1140 Type 2 diabetes mellitus with diabetic neuropathy, unspecified: Secondary | ICD-10-CM

## 2014-06-19 DIAGNOSIS — E1149 Type 2 diabetes mellitus with other diabetic neurological complication: Secondary | ICD-10-CM

## 2014-06-19 MED ORDER — NITROGLYCERIN 0.4 MG SL SUBL: 0.4000 mg | SUBLINGUAL_TABLET | SUBLINGUAL | Status: DC | PRN

## 2014-06-19 NOTE — Assessment & Plan Note (Signed)
Tolerating Lipitor with LDL 67 in April.

## 2014-06-19 NOTE — Assessment & Plan Note (Signed)
No changes made medical regimen today. Keep follow up with Dr. [name].

## 2014-06-19 NOTE — Assessment & Plan Note (Signed)
Symptomatically stable on current medical regimen. We reviewed his medications and a refill for nitroglycerin was provided to ensure that he has a fresh bottle. His ECG is stable. After discussion, plan is to continue medical therapy and observation, reserving followup stress testing for progressive symptomatology or decline in functional status. We will continue annual followup, although I can certainty see him back sooner if needed.

## 2014-06-19 NOTE — Assessment & Plan Note (Signed)
Keep followup with Dr. [name]. 

## 2014-06-19 NOTE — Patient Instructions (Signed)
Your physician recommends that you schedule a follow-up appointment in: 1 year with Dr Ferne Reus will receive a reminder letter two months in advance reminding you to call and schedule your appointment. If you don't receive this letter, please contact our office.  Nitro has been refilled at [facility].

## 2014-06-19 NOTE — Addendum Note (Signed)
Addended by: Hilarie Fredrickson T on: 06/19/2014 09:14 AM   Modules accepted: Orders

## 2014-06-19 NOTE — Progress Notes (Signed)
Clinical Summary Mr. Luke Solis is an 80 y.o.male former patient of Dr. Lia Foyer, last seen in February 2014. This is our first meeting in the office. Cardiac history reviewed below. He is here with his wife today. From a symptom perspective, he has been doing well. He reports no angina symptoms, no unusual shortness of breath with typical activities, no palpitations or syncope, no cardiac hospitalizations.  ECG today shows sinus rhythm with leftward axis and incomplete right bundle branch block which is old. I reviewed the prior tracing.  Lab work from April showed cholesterol 122, triglycerides 59, HDL 43, LDL 67, normal LFTs. He has been tolerating Lipitor at current dose without any significant myalgias or weakness.  He has not had a stress test in several years. We did address this issue today, although in light of no significant angina symptoms and no major change in functional status, it would be reasonable to continue observation on medical therapy for now. He voiced comfort with this.  No Known Allergies  Current Outpatient Prescriptions  Medication Sig Dispense Refill  . aspirin 81 MG tablet Take 81 mg by mouth daily.      Marland Kitchen atorvastatin (LIPITOR) 80 MG tablet TAKE (1) TABLET BY MOUTH ONCE DAILY.  30 tablet  3  . Calcium Carbonate-Vitamin D 600-400 MG-UNIT per tablet Take 1 tablet by mouth daily.      . fexofenadine (ALLEGRA) 180 MG tablet Take 180 mg by mouth as needed.      . hydrochlorothiazide (HYDRODIURIL) 25 MG tablet TAKE 1 TABLET DAILY IN THE MORNING--BEGIN 1/2 TABLET IN THE MORNING.  30 tablet  5  . losartan (COZAAR) 100 MG tablet TAKE 1 TABLET ONCE DAILY.  30 tablet  5  . metFORMIN (GLUCOPHAGE XR) 500 MG 24 hr tablet Take 2 tablets daily with food.  60 tablet  5  . metoprolol tartrate (LOPRESSOR) 25 MG tablet TAKE (1) TABLET TWICE DAILY.  60 tablet  5  . Multiple Vitamin (MULTIVITAMIN) tablet Take 1 tablet by mouth daily.      . nitroGLYCERIN (NITROSTAT) 0.4 MG SL tablet  Place 1 tablet (0.4 mg total) under the tongue every 5 (five) minutes as needed for chest pain.  25 tablet  4  . pregabalin (LYRICA) 100 MG capsule Take 100 mg in the morning and 300 mg in the evening as directed  120 capsule  2   No current facility-administered medications for this visit.    Past Medical History  Diagnosis Date  . Coronary atherosclerosis of native coronary artery     BMS proximal and mid RCA as well as BMS circumflex - 2002 (had residual 70% distal LAD), LVEF 60%  . Essential hypertension, benign   . Hyperlipidemia   . Sleep apnea     On CPAP  . Neuropathy   . Prostate cancer   . NSTEMI (non-ST elevated myocardial infarction)     2002    Past Surgical History  Procedure Laterality Date  . Inguinal hernia repair    . Prostate surgery    . Nose surgery    . Tonsillectomy    . Colonoscopy      Family History  Problem Relation Age of Onset  . Heart attack Mother   . Diabetes Mother   . Coronary artery disease Father   . Hypertension Father   . Heart attack Father     Social History Mr. Bento reports that he has never smoked. He does not have any smokeless tobacco history on  file. Mr. Cloke reports that he does not drink alcohol.  Review of Systems No palpitations, dizziness, syncope, claudication. He has problems with peripheral neuropathy. NYHA class II dyspnea. Stable appetite. No orthopnea or PND. No leg swelling. Other systems reviewed and negative.  Physical Examination Filed Vitals:   06/19/14 0826  BP: 132/82  Pulse: 61   Filed Weights   06/19/14 0826  Weight: 164 lb (74.39 kg)   Patient appears comfortable at rest. HEENT: Conjunctiva and lids normal, oropharynx clear. Neck: Supple, no elevated JVP or carotid bruits, no thyromegaly. Lungs: Clear to auscultation, nonlabored breathing at rest. Cardiac: Regular rate and rhythm, no S3, soft systolic murmur, no pericardial rub. Abdomen: Soft, nontender, bowel sounds present, no guarding or  rebound. Extremities: No pitting edema, distal pulses 2+. Skin: Warm and dry. Musculoskeletal: No kyphosis. Neuropsychiatric: Alert and oriented x3, affect grossly appropriate.   Problem List and Plan   Coronary atherosclerosis of native coronary artery Symptomatically stable on current medical regimen. We reviewed his medications and a refill for nitroglycerin was provided to ensure that he has a fresh bottle. His ECG is stable. After discussion, plan is to continue medical therapy and observation, reserving followup stress testing for progressive symptomatology or decline in functional status. We will continue annual followup, although I can certainty see him back sooner if needed.  HYPERLIPIDEMIA Tolerating Lipitor with LDL 67 in April.  Essential hypertension, benign No changes made medical regimen today. Keep follow up with Dr. [name].  Type 2 diabetes mellitus with diabetic neuropathy Keep follow up with Dr. [name].    Satira Sark, M.D., F.A.C.C.

## 2014-06-20 ENCOUNTER — Other Ambulatory Visit: Payer: Self-pay | Admitting: Family Medicine

## 2014-06-21 ENCOUNTER — Other Ambulatory Visit: Payer: Self-pay | Admitting: *Deleted

## 2014-07-04 ENCOUNTER — Ambulatory Visit: Payer: Medicare Other | Admitting: Pulmonary Disease

## 2014-07-12 ENCOUNTER — Ambulatory Visit (INDEPENDENT_AMBULATORY_CARE_PROVIDER_SITE_OTHER): Payer: Medicare Other | Admitting: Pulmonary Disease

## 2014-07-12 ENCOUNTER — Encounter: Payer: Self-pay | Admitting: Pulmonary Disease

## 2014-07-12 VITALS — BP 118/64 | HR 57 | Temp 97.7°F | Ht 74.0 in | Wt 161.0 lb

## 2014-07-12 DIAGNOSIS — G4733 Obstructive sleep apnea (adult) (pediatric): Secondary | ICD-10-CM

## 2014-07-12 DIAGNOSIS — I2510 Atherosclerotic heart disease of native coronary artery without angina pectoris: Secondary | ICD-10-CM

## 2014-07-12 NOTE — Assessment & Plan Note (Signed)
The patient seems to be doing well from a sleep apnea standpoint, but will need more pressure. We will get him to a home care company who can better meet his needs, and place his device on the automatic setting. I've also asked him to keep up with his mask changes and supplies.

## 2014-07-12 NOTE — Progress Notes (Signed)
   Subjective:    Patient ID: Luke Solis, male    DOB: [birth year], 80 y.o.   MRN: [id number]  HPI The patient comes in today for followup of his obstructive sleep apnea. He was able to find a mask that fits well, and has been wearing the device compliantly. He is having some breakthrough apneas by his download, and we come to find out that his home care company never made changes to his pressure as ordered. The patient feels that he is doing much better with the device, and sleeps fairly well.   Review of Systems  Constitutional: Negative for fever and unexpected weight change.  HENT: Negative for congestion, dental problem, ear pain, nosebleeds, postnasal drip, rhinorrhea, sinus pressure, sneezing, sore throat and trouble swallowing.   Eyes: Negative for redness and itching.  Respiratory: Negative for cough, chest tightness, shortness of breath and wheezing.   Cardiovascular: Negative for palpitations and leg swelling.  Gastrointestinal: Negative for nausea and vomiting.  Genitourinary: Negative for dysuria.  Musculoskeletal: Negative for joint swelling.  Skin: Negative for rash.  Neurological: Negative for headaches.  Hematological: Does not bruise/bleed easily.  Psychiatric/Behavioral: Negative for dysphoric mood. The patient is not nervous/anxious.        Objective:   Physical Exam Well-developed male in no acute distress Nose without purulence or discharge noted No skin breakdown or pressure necrosis from the CPAP mask Neck without lymphadenopathy or thyromegaly Lower extremities without edema, no cyanosis Alert and oriented, moves all 4 extremities.       Assessment & Plan:

## 2014-07-12 NOTE — Patient Instructions (Signed)
Will refer you to a home care company who can make adjustments to your machine.  Will put you on the auto setting, and let us know if this is not working for you.  followup with me again in 81mos, but call if having issues with cpap.

## 2014-07-15 ENCOUNTER — Telehealth: Payer: Self-pay | Admitting: Pulmonary Disease

## 2014-07-15 NOTE — Telephone Encounter (Signed)
Called spoke with [facility] and told her that we just wanted to verify that pt is being established with the [place] office b/c we got a strange message from another DME company this morning.  Melissa will check on this and call back tomorrow.

## 2014-07-15 NOTE — Telephone Encounter (Signed)
LMOM TCB x1 for Luke Solis.   Per pt's chart, order was sent to [facility] to see about establishing pt with the [facility] office.  LMOM TCB x1 for Luke Solis w/ [facility] to check on this.

## 2014-07-15 NOTE — Telephone Encounter (Signed)
Doroteo Bradford called back. She reports Caryl Pina called them on Friday to get download off machine. She reports pt is not in their system. Looks like order was sent to [facility] anyways, nothing further needed

## 2014-07-16 ENCOUNTER — Other Ambulatory Visit: Payer: Self-pay | Admitting: *Deleted

## 2014-07-16 MED ORDER — PREGABALIN 100 MG PO CAPS: ORAL_CAPSULE | ORAL | Status: DC

## 2014-07-16 NOTE — Telephone Encounter (Signed)
Luke Solis called back. She reports they did receive order.  She reports they are not sure what type of machine he has but will call him. If it is not capable of download she will also call back. Nothing further needed

## 2014-07-22 ENCOUNTER — Telehealth: Payer: Self-pay | Admitting: Pulmonary Disease

## 2014-07-22 NOTE — Telephone Encounter (Signed)
Called spoke with pt. He is aware the RX for CPAP was sent to [facility]. Nothing further needed

## 2014-08-19 ENCOUNTER — Telehealth: Payer: Self-pay | Admitting: Pulmonary Disease

## 2014-08-19 DIAGNOSIS — G4733 Obstructive sleep apnea (adult) (pediatric): Secondary | ICD-10-CM

## 2014-08-19 NOTE — Telephone Encounter (Signed)
lmomtcb x1 

## 2014-08-20 NOTE — Telephone Encounter (Signed)
Spoke with patient-he states he has been with [facility] the whole time and does not want to go through [facility] office. Looking through patients chart notes it looks as though we received a call from [facility] regarding patient needs-patient has never used [facility]. Pt wants to stay with [facility] and have order sent to them. FYI: Mr Luke Solis married patients daughter.    [place], please advise if you are okay with patient continuing to use [facility] and have the order sent to them instead of [facility]. Thanks.

## 2014-08-20 NOTE — Telephone Encounter (Signed)
Order sent to [facility] for order to be sent to [facility] with the pt and notified that this was done  Nothing further needed

## 2014-08-20 NOTE — Telephone Encounter (Signed)
Fine with me

## 2014-08-21 DIAGNOSIS — L609 Nail disorder, unspecified: Secondary | ICD-10-CM | POA: Diagnosis not present

## 2014-08-21 DIAGNOSIS — G608 Other hereditary and idiopathic neuropathies: Secondary | ICD-10-CM | POA: Diagnosis not present

## 2014-08-21 DIAGNOSIS — M79609 Pain in unspecified limb: Secondary | ICD-10-CM | POA: Diagnosis not present

## 2014-08-21 DIAGNOSIS — L608 Other nail disorders: Secondary | ICD-10-CM | POA: Diagnosis not present

## 2014-08-26 ENCOUNTER — Telehealth: Payer: Self-pay | Admitting: Pulmonary Disease

## 2014-08-26 DIAGNOSIS — G4733 Obstructive sleep apnea (adult) (pediatric): Secondary | ICD-10-CM

## 2014-08-26 NOTE — Telephone Encounter (Signed)
Recommend order- DME change CPAP to [facility] 8-15 for comfort. Dx OSA

## 2014-08-26 NOTE — Telephone Encounter (Signed)
Order placed. Pt aware.

## 2014-08-26 NOTE — Telephone Encounter (Signed)
Pt returned triage's call & can be reached at [phone].  Luke Solis

## 2014-08-26 NOTE — Telephone Encounter (Signed)
Called spoke with pt. He reports his CPAP is too high. Reports it wakes him up avbout 4 am. Reports it is causing his mask to leak.  [place] is on vacation this week. Download printed. Please advise [facility] if we can give you this to review and make recommendation? thanks

## 2014-08-26 NOTE — Telephone Encounter (Signed)
Called pt LMTCB x1 

## 2014-08-28 ENCOUNTER — Ambulatory Visit (INDEPENDENT_AMBULATORY_CARE_PROVIDER_SITE_OTHER): Payer: Medicare Other | Admitting: Family Medicine

## 2014-08-28 ENCOUNTER — Encounter: Payer: Self-pay | Admitting: Family Medicine

## 2014-08-28 VITALS — BP 132/84 | Ht 74.0 in | Wt 164.0 lb

## 2014-08-28 DIAGNOSIS — I2510 Atherosclerotic heart disease of native coronary artery without angina pectoris: Secondary | ICD-10-CM

## 2014-08-28 DIAGNOSIS — G609 Hereditary and idiopathic neuropathy, unspecified: Secondary | ICD-10-CM

## 2014-08-28 DIAGNOSIS — E1142 Type 2 diabetes mellitus with diabetic polyneuropathy: Secondary | ICD-10-CM

## 2014-08-28 DIAGNOSIS — Z23 Encounter for immunization: Secondary | ICD-10-CM

## 2014-08-28 DIAGNOSIS — E1149 Type 2 diabetes mellitus with other diabetic neurological complication: Secondary | ICD-10-CM

## 2014-08-28 DIAGNOSIS — E1140 Type 2 diabetes mellitus with diabetic neuropathy, unspecified: Secondary | ICD-10-CM

## 2014-08-28 DIAGNOSIS — E119 Type 2 diabetes mellitus without complications: Secondary | ICD-10-CM

## 2014-08-28 DIAGNOSIS — I10 Essential (primary) hypertension: Secondary | ICD-10-CM

## 2014-08-28 NOTE — Progress Notes (Signed)
   Subjective:    Patient ID: Luke Solis, male    DOB: [birth year], 80 y.o.   MRN: [id number]  Hypertension This is a chronic problem. The current episode started in the past 7 days. Pertinent negatives include no chest pain. Risk factors for coronary artery disease include dyslipidemia and male gender.   Problems with neuropathy in feet and pain in legs. Had to have CAP worked on yesterday. Continuing to monitor for progression of his prostate cancer.   Review of Systems  Constitutional: Negative for activity change, appetite change and fatigue.  HENT: Negative for congestion.   Respiratory: Negative for cough.   Cardiovascular: Negative for chest pain.  Gastrointestinal: Negative for abdominal pain.  Endocrine: Negative for polydipsia and polyphagia.  Neurological: Negative for weakness.  Psychiatric/Behavioral: Negative for confusion.       Objective:   Physical Exam  Vitals reviewed. Constitutional: He appears well-nourished. No distress.  Cardiovascular: Normal rate, regular rhythm and normal heart sounds.   No murmur heard. Pulmonary/Chest: Effort normal and breath sounds normal. No respiratory distress.  Musculoskeletal: He exhibits no edema.  Lymphadenopathy:    He has no cervical adenopathy.  Neurological: He is alert.  Psychiatric: His behavior is normal.    Patient also has intermittent pain on the left side of his lower leg when he sits and drives or when he rides I believe this is neuropathic pain probably from impingement in his back there is no masses in the leg no swelling in the leg negative straight leg rates      Assessment & Plan:  #1 neuropathy continue Lyrica he currently cannot tolerate more than 300 mg a day.  #2 diabetes good control do not adjust medicine watch diet #3 hypertension good control continue current measures #4 patient is followed by specialists for CPAP #5 patient will be due comprehensive lab work in 3-4 months #6 leg neuropathy  probably related to nerve impingement in his back

## 2014-09-06 ENCOUNTER — Encounter: Payer: Self-pay | Admitting: Family Medicine

## 2014-09-06 ENCOUNTER — Telehealth: Payer: Self-pay | Admitting: Family Medicine

## 2014-09-06 NOTE — Telephone Encounter (Signed)
A letter was sent to the patient regarding benfotiamine. He may use this to help him with neuropathy.

## 2014-10-21 ENCOUNTER — Other Ambulatory Visit: Payer: Self-pay | Admitting: Family Medicine

## 2014-11-04 DIAGNOSIS — L609 Nail disorder, unspecified: Secondary | ICD-10-CM | POA: Diagnosis not present

## 2014-11-04 DIAGNOSIS — E1342 Other specified diabetes mellitus with diabetic polyneuropathy: Secondary | ICD-10-CM | POA: Diagnosis not present

## 2014-11-04 DIAGNOSIS — M79674 Pain in right toe(s): Secondary | ICD-10-CM | POA: Diagnosis not present

## 2014-11-12 ENCOUNTER — Other Ambulatory Visit: Payer: Self-pay | Admitting: Family Medicine

## 2014-11-18 DIAGNOSIS — C61 Malignant neoplasm of prostate: Secondary | ICD-10-CM | POA: Diagnosis not present

## 2014-11-18 DIAGNOSIS — N3040 Irradiation cystitis without hematuria: Secondary | ICD-10-CM | POA: Insufficient documentation

## 2014-11-18 DIAGNOSIS — R972 Elevated prostate specific antigen [PSA]: Secondary | ICD-10-CM | POA: Diagnosis not present

## 2014-11-27 ENCOUNTER — Other Ambulatory Visit: Payer: Self-pay | Admitting: Family Medicine

## 2014-12-12 ENCOUNTER — Other Ambulatory Visit: Payer: Self-pay | Admitting: Family Medicine

## 2014-12-25 ENCOUNTER — Other Ambulatory Visit: Payer: Self-pay | Admitting: Family Medicine

## 2015-01-11 ENCOUNTER — Telehealth: Payer: Self-pay | Admitting: Family Medicine

## 2015-01-11 NOTE — Telephone Encounter (Signed)
Pt's LYRICA 100 MG capsule has a quantity limit of 3 tablets daily, please see letter in red folder, please advise (also see phone message 03/21/14)

## 2015-01-12 NOTE — Telephone Encounter (Signed)
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 His plan/insurance will only allow for 2 capsules per day. This patient has options. We can go with 200 mg twice a day which is what I would recommend. This would equal his 400 mg per day. Other option would be 150 mg twice daily but that would be less than the total amount he is currently doing. Please talk with the patient see what he would like to do an if he he would like to do 200 mg twice a day send in the appropriate 90 day prescription and change in the electronic record thank you if any problems let me know thank you (I forwarded the original letter regarding this patient. He had brought it here.) Wants you result this situation please also note it on this letter and give it back to [place] so she can also be aware of what transpired

## 2015-01-13 NOTE — Telephone Encounter (Signed)
Patient states that the pharmacy only filled # 90 and he is taking one tablet in the am and 2 tablets in the evening and doing fine- Patient wants to stick with this at this time.

## 2015-01-15 ENCOUNTER — Ambulatory Visit: Payer: Medicare Other | Admitting: Pulmonary Disease

## 2015-01-21 ENCOUNTER — Other Ambulatory Visit: Payer: Self-pay | Admitting: Family Medicine

## 2015-02-06 ENCOUNTER — Other Ambulatory Visit: Payer: Self-pay | Admitting: *Deleted

## 2015-02-06 MED ORDER — METFORMIN HCL ER 500 MG PO TB24: ORAL_TABLET | ORAL | Status: DC

## 2015-02-18 ENCOUNTER — Other Ambulatory Visit: Payer: Self-pay | Admitting: Family Medicine

## 2015-03-06 ENCOUNTER — Other Ambulatory Visit: Payer: Self-pay | Admitting: Family Medicine

## 2015-03-06 NOTE — Telephone Encounter (Signed)
One mo only for all, appt with dr Jamison Neighbor

## 2015-03-31 ENCOUNTER — Encounter: Payer: Self-pay | Admitting: Family Medicine

## 2015-03-31 ENCOUNTER — Ambulatory Visit (INDEPENDENT_AMBULATORY_CARE_PROVIDER_SITE_OTHER): Payer: Medicare Other | Admitting: Family Medicine

## 2015-03-31 VITALS — BP 114/70 | Temp 98.2°F | Ht 74.0 in | Wt 163.5 lb

## 2015-03-31 DIAGNOSIS — G4733 Obstructive sleep apnea (adult) (pediatric): Secondary | ICD-10-CM

## 2015-03-31 DIAGNOSIS — Z79899 Other long term (current) drug therapy: Secondary | ICD-10-CM

## 2015-03-31 DIAGNOSIS — E1140 Type 2 diabetes mellitus with diabetic neuropathy, unspecified: Secondary | ICD-10-CM

## 2015-03-31 DIAGNOSIS — E782 Mixed hyperlipidemia: Secondary | ICD-10-CM | POA: Diagnosis not present

## 2015-03-31 DIAGNOSIS — R0600 Dyspnea, unspecified: Secondary | ICD-10-CM

## 2015-03-31 NOTE — Progress Notes (Signed)
   Subjective:    Patient ID: Luke Solis, male    DOB: [birth year], 81 y.o.   MRN: [id number]  Shortness of Breath This is a new problem. The current episode started 1 to 4 weeks ago. The problem occurs intermittently. The problem has been unchanged. Pertinent negatives include no chest pain, ear pain, fever, leg swelling, rhinorrhea or wheezing. Nothing aggravates the symptoms. The patient has no known risk factors for DVT/PE. He has tried nothing for the symptoms. The treatment provided no relief.   Patient states that his blood sugar has been running high in the mornings. Patient states the highest it has been is 150.   Review of Systems  Constitutional: Negative for fever and activity change.  HENT: Negative for congestion, ear pain and rhinorrhea.   Eyes: Negative for discharge.  Respiratory: Positive for shortness of breath. Negative for cough and wheezing.   Cardiovascular: Negative for chest pain and leg swelling.       Objective:   Physical Exam  Constitutional: He appears well-nourished. No distress.  Cardiovascular: Normal rate, regular rhythm and normal heart sounds.   No murmur heard. Pulmonary/Chest: Effort normal and breath sounds normal. No respiratory distress.  Musculoskeletal: He exhibits no edema.  Lymphadenopathy:    He has no cervical adenopathy.  Neurological: He is alert.  Psychiatric: His behavior is normal.  Vitals reviewed.     EKG with PVCs but otherwise no acute changes     Assessment & Plan:   shortness of breah  need to rule out cardio possibilities. I believe this patient needs further testing EKG looks good await the further testing may need referral back to his cardiologist 25 minutes spent with patient I don't find evidence of CHF or pneumonia or heart attack at this point. Additional lab work ordered to look at [employer] of his health including the diabetes and cholesterl.

## 2015-04-01 ENCOUNTER — Ambulatory Visit (HOSPITAL_COMMUNITY)
Admission: RE | Admit: 2015-04-01 | Discharge: 2015-04-01 | Disposition: A | Discharge status: Home / Self Care | Payer: Medicare Other | Source: Ambulatory Visit | Attending: Family Medicine | Admitting: Family Medicine

## 2015-04-01 ENCOUNTER — Other Ambulatory Visit: Payer: Self-pay | Admitting: Family Medicine

## 2015-04-01 DIAGNOSIS — Z79899 Other long term (current) drug therapy: Secondary | ICD-10-CM | POA: Diagnosis not present

## 2015-04-01 DIAGNOSIS — R0602 Shortness of breath: Secondary | ICD-10-CM | POA: Insufficient documentation

## 2015-04-01 DIAGNOSIS — J984 Other disorders of lung: Secondary | ICD-10-CM | POA: Insufficient documentation

## 2015-04-01 DIAGNOSIS — E782 Mixed hyperlipidemia: Secondary | ICD-10-CM | POA: Diagnosis not present

## 2015-04-01 DIAGNOSIS — E1140 Type 2 diabetes mellitus with diabetic neuropathy, unspecified: Secondary | ICD-10-CM | POA: Diagnosis not present

## 2015-04-01 DIAGNOSIS — R0600 Dyspnea, unspecified: Secondary | ICD-10-CM | POA: Diagnosis not present

## 2015-04-01 LAB — BRAIN NATRIURETIC PEPTIDE: BNP: 136.8 pg/mL — ABNORMAL HIGH (ref 0.0–100.0)

## 2015-04-01 LAB — D-DIMER, QUANTITATIVE (NOT AT ARMC): D-DIMER: 0.45 mg{FEU}/L (ref 0.00–0.49)

## 2015-04-01 MED ORDER — AZITHROMYCIN 250 MG PO TABS: ORAL_TABLET | ORAL | Status: DC

## 2015-04-02 LAB — BASIC METABOLIC PANEL
BUN/Creatinine Ratio: 16 (ref 10–22)
BUN: 17 mg/dL (ref 8–27)
CHLORIDE: 101 mmol/L (ref 97–108)
CO2: 26 mmol/L (ref 18–29)
Calcium: 9.1 mg/dL (ref 8.6–10.2)
Creatinine, Ser: 1.09 mg/dL (ref 0.76–1.27)
GFR calc Af Amer: 73 mL/min/{1.73_m2} (ref >59)
GFR calc non Af Amer: 63 mL/min/{1.73_m2} (ref >59)
GLUCOSE: 119 mg/dL — AB (ref 65–99)
Potassium: 3.9 mmol/L (ref 3.5–5.2)
Sodium: 141 mmol/L (ref 134–144)

## 2015-04-02 LAB — LIPID PANEL
Chol/HDL Ratio: 2.6 ratio units (ref 0.0–5.0)
Cholesterol, Total: 132 mg/dL (ref 100–199)
HDL: 51 mg/dL (ref >39)
LDL Calculated: 66 mg/dL (ref 0–99)
Triglycerides: 74 mg/dL (ref 0–149)
VLDL Cholesterol Cal: 15 mg/dL (ref 5–40)

## 2015-04-02 LAB — HEMOGLOBIN A1C
ESTIMATED AVERAGE GLUCOSE: 148 mg/dL
HEMOGLOBIN A1C: 6.8 % — AB (ref 4.8–5.6)

## 2015-04-03 ENCOUNTER — Encounter: Payer: Self-pay | Admitting: Family Medicine

## 2015-04-04 ENCOUNTER — Other Ambulatory Visit: Payer: Self-pay | Admitting: Family Medicine

## 2015-04-04 NOTE — Telephone Encounter (Signed)
Last check up 08/28/14

## 2015-04-21 ENCOUNTER — Ambulatory Visit (HOSPITAL_COMMUNITY)
Admission: RE | Admit: 2015-04-21 | Discharge: 2015-04-21 | Disposition: A | Discharge status: Home / Self Care | Payer: Medicare Other | Source: Ambulatory Visit | Attending: Family Medicine | Admitting: Family Medicine

## 2015-04-21 ENCOUNTER — Other Ambulatory Visit: Payer: Self-pay | Admitting: *Deleted

## 2015-04-21 ENCOUNTER — Other Ambulatory Visit: Payer: Self-pay | Admitting: Family Medicine

## 2015-04-21 DIAGNOSIS — R918 Other nonspecific abnormal finding of lung field: Secondary | ICD-10-CM | POA: Insufficient documentation

## 2015-04-21 DIAGNOSIS — R0602 Shortness of breath: Secondary | ICD-10-CM

## 2015-04-24 DIAGNOSIS — H2513 Age-related nuclear cataract, bilateral: Secondary | ICD-10-CM | POA: Diagnosis not present

## 2015-04-24 DIAGNOSIS — H538 Other visual disturbances: Secondary | ICD-10-CM | POA: Diagnosis not present

## 2015-04-24 LAB — HM DIABETES EYE EXAM

## 2015-04-29 ENCOUNTER — Ambulatory Visit (INDEPENDENT_AMBULATORY_CARE_PROVIDER_SITE_OTHER): Payer: Medicare Other | Admitting: Family Medicine

## 2015-04-29 ENCOUNTER — Encounter: Payer: Self-pay | Admitting: Family Medicine

## 2015-04-29 VITALS — BP 122/68 | Ht 74.0 in | Wt 164.8 lb

## 2015-04-29 DIAGNOSIS — J189 Pneumonia, unspecified organism: Secondary | ICD-10-CM | POA: Diagnosis not present

## 2015-04-29 DIAGNOSIS — I10 Essential (primary) hypertension: Secondary | ICD-10-CM | POA: Diagnosis not present

## 2015-04-29 DIAGNOSIS — E1140 Type 2 diabetes mellitus with diabetic neuropathy, unspecified: Secondary | ICD-10-CM

## 2015-04-29 DIAGNOSIS — E782 Mixed hyperlipidemia: Secondary | ICD-10-CM

## 2015-04-29 DIAGNOSIS — R6889 Other general symptoms and signs: Secondary | ICD-10-CM | POA: Diagnosis not present

## 2015-04-29 NOTE — Progress Notes (Signed)
   Subjective:    Patient ID: Luke Solis, male    DOB: 07/04/1931, 81 y.o.   MRN: [id number]  HPI Patient arrives to follow up on dyspnea and recent chest xray- patient states he is feeling better. Patient took anabiotic C-section do much better now breathing well  He states his sugars overall are doing fairly good we will over his lab work his A1c is acceptable and I would not recommend any aggressive measures be on what he is doing but I do recommend a better diet Patient having some allergies using over-the-counter allergy medicine as necessary Neuropathy is stable he continues Lyrica seems to help some Hyperlipidemia under good control continue current measures no sign of return of heart disease or stroke Blood pressure under good control continue current measures watch diet closely stay physically active  Family relates occasionally forgets things like this past weekend he forgot how to tie his neck tie he also occasionally forgets names but otherwise is been functioning well Review of Systems  Constitutional: Negative for activity change, appetite change and fatigue.  HENT: Negative for congestion.   Respiratory: Negative for cough.   Cardiovascular: Negative for chest pain.  Gastrointestinal: Negative for abdominal pain.  Endocrine: Negative for polydipsia and polyphagia.  Neurological: Negative for weakness.  Psychiatric/Behavioral: Negative for confusion.       Objective:   Physical Exam  Constitutional: He appears well-nourished. No distress.  Cardiovascular: Normal rate, regular rhythm and normal heart sounds.   No murmur heard. Pulmonary/Chest: Effort normal and breath sounds normal. No respiratory distress.  Musculoskeletal: He exhibits no edema.  Lymphadenopathy:    He has no cervical adenopathy.  Neurological: He is alert.  Psychiatric: His behavior is normal.  Vitals reviewed.         Assessment & Plan:  1. Type 2 diabetes mellitus with diabetic  neuropathy A1c for his age is under good control continue current measures watch diet cut out excess sugars follow-up again in approximately 4 months  2. Mixed hyperlipidemia Cholesterol overall looks good continue current measures recheck if any problems recheck lab work in the fall  3. Essential hypertension, benign Blood pressure under very good control continue current measures watch diet stay way from excess salt  4. Pneumonitis Patient recent pneumonitis follow-up x-ray looks good no need for further intervention BNP slightly elevated but I don't believe this is due to cardiac reasons I think is more likely pulmonary  5. Forgetfulness Patient with occasional forgetfulness his immediate recall 343 after distraction 343 his animal naming test passed. I don't believe this patient has dementia. I would recommend follow-up close following. If progressive troubles will need to do further detailed testing  Follow-up in October November at the latest

## 2015-04-30 DIAGNOSIS — C61 Malignant neoplasm of prostate: Secondary | ICD-10-CM | POA: Diagnosis not present

## 2015-05-09 DIAGNOSIS — R972 Elevated prostate specific antigen [PSA]: Secondary | ICD-10-CM | POA: Diagnosis not present

## 2015-05-09 DIAGNOSIS — C61 Malignant neoplasm of prostate: Secondary | ICD-10-CM | POA: Diagnosis not present

## 2015-05-09 DIAGNOSIS — N3040 Irradiation cystitis without hematuria: Secondary | ICD-10-CM | POA: Diagnosis not present

## 2015-05-12 ENCOUNTER — Encounter: Payer: Self-pay | Admitting: Family Medicine

## 2015-05-12 ENCOUNTER — Ambulatory Visit (INDEPENDENT_AMBULATORY_CARE_PROVIDER_SITE_OTHER): Payer: Medicare Other | Admitting: Family Medicine

## 2015-05-12 VITALS — BP 134/78 | HR 54 | Ht 74.0 in | Wt 164.0 lb

## 2015-05-12 DIAGNOSIS — J028 Acute pharyngitis due to other specified organisms: Secondary | ICD-10-CM

## 2015-05-12 DIAGNOSIS — J029 Acute pharyngitis, unspecified: Secondary | ICD-10-CM | POA: Diagnosis not present

## 2015-05-12 DIAGNOSIS — I493 Ventricular premature depolarization: Secondary | ICD-10-CM

## 2015-05-12 DIAGNOSIS — R001 Bradycardia, unspecified: Secondary | ICD-10-CM

## 2015-05-12 LAB — POCT RAPID STREP A (OFFICE): Rapid Strep A Screen: NEGATIVE

## 2015-05-12 NOTE — Progress Notes (Signed)
   Subjective:    Patient ID: Luke Solis, male    DOB: March 20, 1931, 81 y.o.   MRN: [id number]  HPI Patient is here today because he has a very low rate heart. Pulse today is 54. Patient states that this first occurred on Friday when his heart rate was 34.  Patient states nothing different has change. He relates he went to the urologist was told his heart rate was really slow he checked it a few times over the weekend and it was in fact slow. Related some fatigue but no syncope issues. Patient has no other concerns at this time.    Review of Systems    he denies any chest tightness pressure pain he does relate some fatigue denies excessive swelling in the legs no PND denies hemoptysis does relate sore throat denies nasal drainage denies fever Objective:   Physical Exam Neck no masses throat minimal erythema lungs are clear no crackles heart occasional irregular beat otherwise her heart rate in the 50s to 60s abdomen soft extremities no edema blood pressure laying sitting standing no appreciable change       Assessment & Plan:  No sign of orthostatic hypotension Lab work was ordered patient will have this drawn Patient not taking metoprolol currently he will hold off on this until cardiology gives further directions PVCs new diagnosis Reported severe bradycardia Friday Saturday with some fatigue but no syncope Referral to cardiology I believe patient will benefit from having further testing. Telemetry recommended. Possibly echo. He sees Dr. Domenic Polite on a regular basis. It will be seeing Jory Sims NP tomorrow who more than likely will initiate testing and set patient up for follow-up with Dr. Domenic Polite

## 2015-05-13 ENCOUNTER — Encounter: Payer: Self-pay | Admitting: Adult Health

## 2015-05-13 ENCOUNTER — Ambulatory Visit (INDEPENDENT_AMBULATORY_CARE_PROVIDER_SITE_OTHER): Payer: Medicare Other | Admitting: Adult Health

## 2015-05-13 ENCOUNTER — Encounter: Payer: Self-pay | Admitting: *Deleted

## 2015-05-13 VITALS — BP 138/76 | HR 63 | Ht 74.0 in | Wt 163.0 lb

## 2015-05-13 DIAGNOSIS — R5383 Other fatigue: Secondary | ICD-10-CM | POA: Diagnosis not present

## 2015-05-13 DIAGNOSIS — I2510 Atherosclerotic heart disease of native coronary artery without angina pectoris: Secondary | ICD-10-CM

## 2015-05-13 LAB — BASIC METABOLIC PANEL
BUN/Creatinine Ratio: 19 (ref 10–22)
BUN: 22 mg/dL (ref 8–27)
CALCIUM: 9.5 mg/dL (ref 8.6–10.2)
CO2: 29 mmol/L (ref 18–29)
CREATININE: 1.14 mg/dL (ref 0.76–1.27)
Chloride: 102 mmol/L (ref 97–108)
GFR calc non Af Amer: 60 mL/min/{1.73_m2} (ref >59)
GFR, EST AFRICAN AMERICAN: 69 mL/min/{1.73_m2} (ref >59)
Glucose: 113 mg/dL — ABNORMAL HIGH (ref 65–99)
Potassium: 4 mmol/L (ref 3.5–5.2)
SODIUM: 145 mmol/L — AB (ref 134–144)

## 2015-05-13 LAB — MAGNESIUM: Magnesium: 2 mg/dL (ref 1.6–2.3)

## 2015-05-13 LAB — T4, FREE: Free T4: 1.14 ng/dL (ref 0.82–1.77)

## 2015-05-13 LAB — STREP A DNA PROBE: Strep Gp A Direct, DNA Probe: NEGATIVE

## 2015-05-13 LAB — TSH: TSH: 1.28 u[IU]/mL (ref 0.450–4.500)

## 2015-05-13 NOTE — Progress Notes (Signed)
Cardiology Office Note   Date:  05/13/2015   ID:  [name], DOB 1931/08/25, MRN [id number]  PCP:  Sallee Lange, MD  Cardiologist:  McDowell/ Jory Sims, NP   Chief Complaint  Patient presents with  . Coronary Artery Disease    bMS to prox and mid RCA as well as BMS to Cx.  Marland Kitchen Hypertension      History of Present Illness: Luke Solis is a 81 y.o. male who presents for ongoing assessment and management of CAD, hyperlipidemia, and hypertension, He was last seen by Dr. Domenic Polite on 06/19/2014. He was found to be clinically stable and was continued on medical management.     He comes today after having seen PCP for fatigue and found to be bradycardic with HR of 32 bpm. EKG demonstrated Bigmeny,. Labs were drawn by Dr. [name] and all were found to be WNL without hypokalemia, dehydration or anemia. He was taken off of metoprolol by Dr.[name].    His wife states that he is sill fatigued.He is downplaying his symptoms but his wife states she has noticed a difference of over the last few weeks. He denies chest pain or dizziness. No increase dyspnea.   Past Medical History  Diagnosis Date  . Coronary atherosclerosis of native coronary artery     BMS proximal and mid RCA as well as BMS circumflex - 2002 (had residual 70% distal LAD), LVEF 60%  . Essential hypertension, benign   . Hyperlipidemia   . Sleep apnea     On CPAP  . Neuropathy   . Prostate cancer   . NSTEMI (non-ST elevated myocardial infarction)     2002    Past Surgical History  Procedure Laterality Date  . Inguinal hernia repair    . Prostate surgery    . Nose surgery    . Tonsillectomy    . Colonoscopy       Current Outpatient Prescriptions  Medication Sig Dispense Refill  . aspirin 81 MG tablet Take 81 mg by mouth daily.    Marland Kitchen atorvastatin (LIPITOR) 80 MG tablet TAKE (1) TABLET BY MOUTH ONCE DAILY. 30 tablet 0  . Calcium Carbonate-Vitamin D 600-400 MG-UNIT per tablet Take 1 tablet by mouth daily.    .  fexofenadine (ALLEGRA) 180 MG tablet Take 180 mg by mouth as needed.    . hydrochlorothiazide (HYDRODIURIL) 25 MG tablet TAKE ONE TABLET DAILY IN THE MORNING. 30 tablet 1  . losartan (COZAAR) 100 MG tablet TAKE (1) TABLET BY MOUTH ONCE DAILY. 30 tablet 0  . LYRICA 100 MG capsule TAKE 1 CAPSULE IN THE MORNING AND 3 CAPSULES AT BEDTIME 90 capsule 5  . metFORMIN (GLUCOPHAGE-XR) 500 MG 24 hr tablet TAKE 2 TABLETS ONCE DAILY WITH FOOD. 60 tablet 0  . Multiple Vitamin (MULTIVITAMIN) tablet Take 1 tablet by mouth daily.    . nitroGLYCERIN (NITROSTAT) 0.4 MG SL tablet Place 1 tablet (0.4 mg total) under the tongue every 5 (five) minutes as needed for chest pain. 25 tablet 4   No current facility-administered medications for this visit.    Allergies:   Review of patient's allergies indicates no known allergies.    Social History:  The patient  reports that he has never smoked. He does not have any smokeless tobacco history on file. He reports that he does not drink alcohol or use illicit drugs.   Family History:  The patient's family history includes Coronary artery disease in his father; Diabetes in his mother; Heart attack in  his father and mother; Hypertension in his father.    ROS: .   All other systems are reviewed and negative.Unless otherwise mentioned in H&P above.   PHYSICAL EXAM: VS:  BP 138/76 mmHg  Pulse 63  Ht 6\' 2"  (1.88 m)  Wt 163 lb (73.936 kg)  BMI 20.92 kg/m2  SpO2 93% , BMI Body mass index is 20.92 kg/(m^2). GEN: Well nourished, well developed, in no acute distress HEENT: normal Neck: no JVD, carotid bruits, or masses Cardiac: RRR;wiht extra systole no murmurs, rubs, or gallops,no edema  Respiratory:  clear to auscultation bilaterally, normal work of breathing GI: soft, nontender, nondistended, + BS MS: no deformity or atrophy Skin: warm and dry, no rash Neuro:  Strength and sensation are intact Psych: euthymic mood, full affect    Recent Labs: 04/01/2015: B  Natriuretic Peptide 136.8* 05/12/2015: BUN 22; Creatinine 1.14; Magnesium 2.0; Potassium 4.0; Sodium 145*; TSH 1.280    Lipid Panel    Component Value Date/Time   CHOL 132 04/01/2015 0948   CHOL 122 03/12/2014 0933   TRIG 74 04/01/2015 0948   HDL 51 04/01/2015 0948   HDL 43 03/12/2014 0933   CHOLHDL 2.6 04/01/2015 0948   CHOLHDL 2.8 03/12/2014 0933   VLDL 12 03/12/2014 0933   LDLCALC 66 04/01/2015 0948   LDLCALC 67 03/12/2014 0933      Wt Readings from Last 3 Encounters:  05/13/15 163 lb (73.936 kg)  05/12/15 164 lb (74.39 kg)  04/29/15 164 lb 12.8 oz (74.753 kg)      Other studies Reviewed: Additional studies/ records that were reviewed today include: None  ASSESSMENT AND PLAN:  1.CAD: Known hx of stents to RCA and with residual disease in the LAD in 2002. He is becoming more fatigued and is having some bradycardia. Agree with taking im aoff of metoprolol for now. Will plan exercise stress Cardiolite and echocardiogram to evaluate progression of CAD and LV fx. He is in agreement with this and will follow up with Dr.McDowell or myself to discuss the results. If abnormal may need to proceed with catheterization.   2. Hypertension: BP is well controlled now. No changes. I have reviewed labs from Dr. [name] in [place].    Current medicines are reviewed at length with the patient today.    Labs/ tests ordered today include: Echocardiogram, Cardiolite stress test. No orders of the defined types were placed in this encounter.     Disposition:   FU with Dr. Domenic Polite to discuss results. Signed, Jory Sims, NP  05/13/2015 3:01 PM    [facility] [address] [place], [place] [address] Phone: [phone]; Fax: [phone]

## 2015-05-13 NOTE — Progress Notes (Deleted)
Name: Luke Solis    DOB: 10-20-1931  Age: 81 y.o.  MR#: [id number]       PCP:  Sallee Lange, MD      Insurance: Payor: MEDICARE / Plan: MEDICARE PART A AND B / Product Type: *No Product type* /   CC:    Chief Complaint  Patient presents with  . Coronary Artery Disease    bMS to prox and mid RCA as well as BMS to Cx.  Marland Kitchen Hypertension    VS Filed Vitals:   05/13/15 1413  BP: 138/76  Pulse: 63  Height: '6\' 2"'  (1.88 m)  Weight: 163 lb (73.936 kg)  SpO2: 93%    Weights Current Weight  05/13/15 163 lb (73.936 kg)  05/12/15 164 lb (74.39 kg)  04/29/15 164 lb 12.8 oz (74.753 kg)    Blood Pressure  BP Readings from Last 3 Encounters:  05/13/15 138/76  05/12/15 134/78  04/29/15 122/68     Admit date:  (Not on file) Last encounter with RMR:  Visit date not found   Allergy Review of patient's allergies indicates no known allergies.  Current Outpatient Prescriptions  Medication Sig Dispense Refill  . aspirin 81 MG tablet Take 81 mg by mouth daily.    Marland Kitchen atorvastatin (LIPITOR) 80 MG tablet TAKE (1) TABLET BY MOUTH ONCE DAILY. 30 tablet 0  . Calcium Carbonate-Vitamin D 600-400 MG-UNIT per tablet Take 1 tablet by mouth daily.    . fexofenadine (ALLEGRA) 180 MG tablet Take 180 mg by mouth as needed.    . hydrochlorothiazide (HYDRODIURIL) 25 MG tablet TAKE ONE TABLET DAILY IN THE MORNING. 30 tablet 1  . losartan (COZAAR) 100 MG tablet TAKE (1) TABLET BY MOUTH ONCE DAILY. 30 tablet 0  . LYRICA 100 MG capsule TAKE 1 CAPSULE IN THE MORNING AND 3 CAPSULES AT BEDTIME 90 capsule 5  . metFORMIN (GLUCOPHAGE-XR) 500 MG 24 hr tablet TAKE 2 TABLETS ONCE DAILY WITH FOOD. 60 tablet 0  . Multiple Vitamin (MULTIVITAMIN) tablet Take 1 tablet by mouth daily.    . nitroGLYCERIN (NITROSTAT) 0.4 MG SL tablet Place 1 tablet (0.4 mg total) under the tongue every 5 (five) minutes as needed for chest pain. 25 tablet 4   No current facility-administered medications for this visit.    Discontinued Meds:     Medications Discontinued During This Encounter  Medication Reason  . metoprolol tartrate (LOPRESSOR) 25 MG tablet Error    Patient Active Problem List   Diagnosis Date Noted  . Forgetfulness 04/29/2015  . Type 2 diabetes mellitus with diabetic neuropathy 07/17/2013  . HYPERLIPIDEMIA 01/19/2010  . OSA (obstructive sleep apnea) 01/19/2010  . Essential hypertension, benign 01/19/2010  . Coronary atherosclerosis of native coronary artery 01/19/2010    LABS    Component Value Date/Time   NA 145* 05/12/2015 1220   NA 141 04/01/2015 0948   NA 143 03/12/2014 0933   NA 141 09/11/2007 1600   K 4.0 05/12/2015 1220   K 3.9 04/01/2015 0948   K 4.1 03/12/2014 0933   CL 102 05/12/2015 1220   CL 101 04/01/2015 0948   CL 105 03/12/2014 0933   CO2 29 05/12/2015 1220   CO2 26 04/01/2015 0948   CO2 29 03/12/2014 0933   GLUCOSE 113* 05/12/2015 1220   GLUCOSE 119* 04/01/2015 0948   GLUCOSE 114* 03/12/2014 0933   GLUCOSE 217* 09/11/2007 1600   BUN 22 05/12/2015 1220   BUN 17 04/01/2015 0948   BUN 21 03/12/2014 0933  BUN 15 09/11/2007 1600   CREATININE 1.14 05/12/2015 1220   CREATININE 1.09 04/01/2015 0948   CREATININE 0.98 03/12/2014 0933   CREATININE 1.30 09/11/2007 1600   CALCIUM 9.5 05/12/2015 1220   CALCIUM 9.1 04/01/2015 0948   CALCIUM 9.2 03/12/2014 0933   GFRNONAA 60 05/12/2015 1220   GFRNONAA 63 04/01/2015 0948   GFRNONAA 54* 09/11/2007 1600   GFRAA 69 05/12/2015 1220   GFRAA 73 04/01/2015 0948   GFRAA  09/11/2007 1600    >60        The eGFR has been calculated using the MDRD equation. This calculation has not been validated in all clinical   CMP     Component Value Date/Time   NA 145* 05/12/2015 1220   NA 143 03/12/2014 0933   K 4.0 05/12/2015 1220   CL 102 05/12/2015 1220   CO2 29 05/12/2015 1220   GLUCOSE 113* 05/12/2015 1220   GLUCOSE 114* 03/12/2014 0933   BUN 22 05/12/2015 1220   BUN 21 03/12/2014 0933   CREATININE 1.14 05/12/2015 1220   CREATININE  0.98 03/12/2014 0933   CALCIUM 9.5 05/12/2015 1220   PROT 6.4 03/12/2014 0933   ALBUMIN 4.1 03/12/2014 0933   AST 19 03/12/2014 0933   ALT 19 03/12/2014 0933   ALKPHOS 64 03/12/2014 0933   BILITOT 0.6 03/12/2014 0933   GFRNONAA 60 05/12/2015 1220   GFRAA 69 05/12/2015 1220       Component Value Date/Time   WBC 6.2 09/11/2007 1600   HGB 13.6 09/13/2007 0801   HGB 12.3* 09/11/2007 1600   HCT 35.8* 09/11/2007 1600   MCV 92.5 09/11/2007 1600    Lipid Panel     Component Value Date/Time   CHOL 132 04/01/2015 0948   CHOL 122 03/12/2014 0933   TRIG 74 04/01/2015 0948   HDL 51 04/01/2015 0948   HDL 43 03/12/2014 0933   CHOLHDL 2.6 04/01/2015 0948   CHOLHDL 2.8 03/12/2014 0933   VLDL 12 03/12/2014 0933   LDLCALC 66 04/01/2015 0948   LDLCALC 67 03/12/2014 0933    ABG No results found for: PHART, PCO2ART, PO2ART, HCO3, TCO2, ACIDBASEDEF, O2SAT   Lab Results  Component Value Date   TSH 1.280 05/12/2015   BNP (last 3 results)  Recent Labs  04/01/15 0829  BNP 136.8*    ProBNP (last 3 results) No results for input(s): PROBNP in the last 8760 hours.  Cardiac Panel (last 3 results) No results for input(s): CKTOTAL, CKMB, TROPONINI, RELINDX in the last 72 hours.  Iron/TIBC/Ferritin/ %Sat No results found for: IRON, TIBC, FERRITIN, IRONPCTSAT   EKG Orders placed or performed in visit on 06/19/14  . EKG 12-Lead     Prior Assessment and Plan Problem List as of 05/13/2015      Cardiovascular and Mediastinum   Essential hypertension, benign   Last Assessment & Plan 06/19/2014 Office Visit Written 06/19/2014  8:59 AM by Satira Sark, MD    No changes made medical regimen today. Keep follow up with Dr. [name].      Coronary atherosclerosis of native coronary artery   Last Assessment & Plan 06/19/2014 Office Visit Written 06/19/2014  8:58 AM by Satira Sark, MD    Symptomatically stable on current medical regimen. We reviewed his medications and a refill for  nitroglycerin was provided to ensure that he has a fresh bottle. His ECG is stable. After discussion, plan is to continue medical therapy and observation, reserving followup stress testing for progressive symptomatology or decline  in functional status. We will continue annual followup, although I can certainty see him back sooner if needed.        Respiratory   OSA (obstructive sleep apnea)   Last Assessment & Plan 07/12/2014 Office Visit Written 07/12/2014  9:27 AM by Kathee Delton, MD    The patient seems to be doing well from a sleep apnea standpoint, but will need more pressure. We will get him to a home care company who can better meet his needs, and place his device on the automatic setting. I've also asked him to keep up with his mask changes and supplies.        Endocrine   Type 2 diabetes mellitus with diabetic neuropathy   Last Assessment & Plan 06/19/2014 Office Visit Written 06/19/2014  8:59 AM by Satira Sark, MD    Keep follow up with Dr. [name].        Other   HYPERLIPIDEMIA   Last Assessment & Plan 06/19/2014 Office Visit Written 06/19/2014  8:58 AM by Satira Sark, MD    Tolerating Lipitor with LDL 67 in April.      Forgetfulness       Imaging: Dg Chest Special View  04/21/2015   CLINICAL DATA:  Lung nodules.  Evaluate for nipple shadow  EXAM: CHEST SPECIAL VIEW  COMPARISON:  04/01/2015  FINDINGS: Nipple markers correspond to bibasilar nodular densities on the prior chest x-ray consistent with nipple shadows. Lungs otherwise clear  IMPRESSION: Bibasilar lung nodules correspond to the nipple shadows.   Electronically Signed   By: Franchot Gallo M.D.   On: 04/21/2015 14:48

## 2015-05-13 NOTE — Patient Instructions (Signed)
Your physician recommends that you schedule a follow-up appointment After Stress test and ECHO  Your physician has requested that you have an echocardiogram. Echocardiography is a painless test that uses sound waves to create images of your heart. It provides your doctor with information about the size and shape of your heart and how well your heart's chambers and valves are working. This procedure takes approximately one hour. There are no restrictions for this procedure.  Your physician has requested that you have en exercise stress myoview. For further information please visit [email or web address]. Please follow instruction sheet, as given.  Your physician recommends that you continue on your current medications as directed. Please refer to the Current Medication list given to you today.  Thank you for choosing [facility]!

## 2015-05-19 ENCOUNTER — Other Ambulatory Visit: Payer: Self-pay | Admitting: Family Medicine

## 2015-05-22 ENCOUNTER — Encounter (HOSPITAL_COMMUNITY)
Admission: RE | Admit: 2015-05-22 | Discharge: 2015-05-22 | Disposition: A | Discharge status: Home / Self Care | Payer: Medicare Other | Source: Ambulatory Visit | Attending: Adult Health | Admitting: Adult Health

## 2015-05-22 ENCOUNTER — Encounter (HOSPITAL_COMMUNITY): Payer: Self-pay

## 2015-05-22 ENCOUNTER — Inpatient Hospital Stay (HOSPITAL_COMMUNITY): Admission: RE | Admit: 2015-05-22 | Payer: Medicare Other | Source: Ambulatory Visit

## 2015-05-22 ENCOUNTER — Ambulatory Visit (HOSPITAL_COMMUNITY)
Admission: RE | Admit: 2015-05-22 | Discharge: 2015-05-22 | Disposition: A | Discharge status: Home / Self Care | Payer: Medicare Other | Source: Ambulatory Visit | Attending: Adult Health | Admitting: Adult Health

## 2015-05-22 DIAGNOSIS — I2510 Atherosclerotic heart disease of native coronary artery without angina pectoris: Secondary | ICD-10-CM | POA: Insufficient documentation

## 2015-05-22 DIAGNOSIS — I10 Essential (primary) hypertension: Secondary | ICD-10-CM | POA: Diagnosis not present

## 2015-05-22 DIAGNOSIS — R5383 Other fatigue: Secondary | ICD-10-CM

## 2015-05-22 DIAGNOSIS — R079 Chest pain, unspecified: Secondary | ICD-10-CM | POA: Insufficient documentation

## 2015-05-22 MED ORDER — REGADENOSON 0.4 MG/5ML IV SOLN
INTRAVENOUS | Status: AC
  Filled 2015-05-22: qty 5

## 2015-05-22 MED ORDER — TECHNETIUM TC 99M SESTAMIBI - CARDIOLITE
30.0000 | Freq: Once | INTRAVENOUS | Status: AC | PRN
  Administered 2015-05-22: 30 via INTRAVENOUS

## 2015-05-22 MED ORDER — TECHNETIUM TC 99M SESTAMIBI GENERIC - CARDIOLITE
10.0000 | Freq: Once | INTRAVENOUS | Status: AC | PRN
  Administered 2015-05-22: 10 via INTRAVENOUS

## 2015-05-22 MED ORDER — SODIUM CHLORIDE 0.9 % IJ SOLN
INTRAMUSCULAR | Status: AC
  Administered 2015-05-22: 10 mL via INTRAVENOUS
  Filled 2015-05-22: qty 3

## 2015-05-26 ENCOUNTER — Encounter: Payer: Self-pay | Admitting: Family Medicine

## 2015-05-26 ENCOUNTER — Encounter: Payer: Self-pay | Admitting: Adult Health

## 2015-05-26 ENCOUNTER — Ambulatory Visit (INDEPENDENT_AMBULATORY_CARE_PROVIDER_SITE_OTHER): Payer: Medicare Other | Admitting: Adult Health

## 2015-05-26 VITALS — BP 138/82 | HR 53 | Ht 74.0 in | Wt 163.0 lb

## 2015-05-26 DIAGNOSIS — I10 Essential (primary) hypertension: Secondary | ICD-10-CM

## 2015-05-26 DIAGNOSIS — R001 Bradycardia, unspecified: Secondary | ICD-10-CM

## 2015-05-26 DIAGNOSIS — I2510 Atherosclerotic heart disease of native coronary artery without angina pectoris: Secondary | ICD-10-CM

## 2015-05-26 MED ORDER — NITROGLYCERIN 0.4 MG SL SUBL: 0.4000 mg | SUBLINGUAL_TABLET | SUBLINGUAL | Status: DC | PRN

## 2015-05-26 NOTE — Patient Instructions (Signed)
Your physician recommends that you schedule a follow-up appointment in: July with Dr. Domenic Polite  Your physician has recommended that you wear an event monitor for 1 week. Event monitors are medical devices that record the heart's electrical activity. Doctors most often [place] these monitors to diagnose arrhythmias. Arrhythmias are problems with the speed or rhythm of the heartbeat. The monitor is a small, portable device. You can wear one while you do your normal daily activities. This is usually used to diagnose what is causing palpitations/syncope (passing out).  Your physician recommends that you continue on your current medications as directed. Please refer to the Current Medication list given to you today.  Thank you for choosing [facility]!

## 2015-05-26 NOTE — Progress Notes (Deleted)
Name: Luke Solis    DOB: 02/02/1931  Age: 81 y.o.  MR#: [id number]       PCP:  Sallee Lange, MD      Insurance: Payor: MEDICARE / Plan: MEDICARE PART A AND B / Product Type: *No Product type* /   CC:    Chief Complaint  Patient presents with  . Coronary Artery Disease  . Hypertension    VS Filed Vitals:   05/26/15 1339  BP: 138/82  Pulse: 53  Height: '6\' 2"'  (1.88 m)  Weight: 163 lb (73.936 kg)  SpO2: 98%    Weights Current Weight  05/26/15 163 lb (73.936 kg)  05/13/15 163 lb (73.936 kg)  05/12/15 164 lb (74.39 kg)    Blood Pressure  BP Readings from Last 3 Encounters:  05/26/15 138/82  05/13/15 138/76  05/12/15 134/78     Admit date:  (Not on file) Last encounter with RMR:  05/13/2015   Allergy Review of patient's allergies indicates no known allergies.  Current Outpatient Prescriptions  Medication Sig Dispense Refill  . aspirin 81 MG tablet Take 81 mg by mouth daily.    Marland Kitchen atorvastatin (LIPITOR) 80 MG tablet TAKE (1) TABLET BY MOUTH ONCE DAILY. 30 tablet 0  . Calcium Carbonate-Vitamin D 600-400 MG-UNIT per tablet Take 1 tablet by mouth daily.    . fexofenadine (ALLEGRA) 180 MG tablet Take 180 mg by mouth as needed.    . hydrochlorothiazide (HYDRODIURIL) 25 MG tablet TAKE ONE TABLET DAILY IN THE MORNING. 30 tablet 1  . losartan (COZAAR) 100 MG tablet TAKE 1 TABLET ONCE DAILY. 30 tablet 0  . LYRICA 100 MG capsule TAKE 1 CAPSULE IN THE MORNING AND 3 CAPSULES AT BEDTIME 90 capsule 5  . metFORMIN (GLUCOPHAGE-XR) 500 MG 24 hr tablet TAKE 2 TABLETS ONCE DAILY WITH FOOD. 60 tablet 0  . Multiple Vitamin (MULTIVITAMIN) tablet Take 1 tablet by mouth daily.    . nitroGLYCERIN (NITROSTAT) 0.4 MG SL tablet Place 1 tablet (0.4 mg total) under the tongue every 5 (five) minutes as needed for chest pain. 25 tablet 4   No current facility-administered medications for this visit.    Discontinued Meds:    Medications Discontinued During This Encounter  Medication Reason  .  nitroGLYCERIN (NITROSTAT) 0.4 MG SL tablet Reorder    Patient Active Problem List   Diagnosis Date Noted  . Forgetfulness 04/29/2015  . Type 2 diabetes mellitus with diabetic neuropathy 07/17/2013  . HYPERLIPIDEMIA 01/19/2010  . OSA (obstructive sleep apnea) 01/19/2010  . Essential hypertension, benign 01/19/2010  . Coronary atherosclerosis of native coronary artery 01/19/2010    LABS    Component Value Date/Time   NA 145* 05/12/2015 1220   NA 141 04/01/2015 0948   NA 143 03/12/2014 0933   NA 141 09/11/2007 1600   K 4.0 05/12/2015 1220   K 3.9 04/01/2015 0948   K 4.1 03/12/2014 0933   CL 102 05/12/2015 1220   CL 101 04/01/2015 0948   CL 105 03/12/2014 0933   CO2 29 05/12/2015 1220   CO2 26 04/01/2015 0948   CO2 29 03/12/2014 0933   GLUCOSE 113* 05/12/2015 1220   GLUCOSE 119* 04/01/2015 0948   GLUCOSE 114* 03/12/2014 0933   GLUCOSE 217* 09/11/2007 1600   BUN 22 05/12/2015 1220   BUN 17 04/01/2015 0948   BUN 21 03/12/2014 0933   BUN 15 09/11/2007 1600   CREATININE 1.14 05/12/2015 1220   CREATININE 1.09 04/01/2015 0948   CREATININE 0.98 03/12/2014 0933  CREATININE 1.30 09/11/2007 1600   CALCIUM 9.5 05/12/2015 1220   CALCIUM 9.1 04/01/2015 0948   CALCIUM 9.2 03/12/2014 0933   GFRNONAA 60 05/12/2015 1220   GFRNONAA 63 04/01/2015 0948   GFRNONAA 54* 09/11/2007 1600   GFRAA 69 05/12/2015 1220   GFRAA 73 04/01/2015 0948   GFRAA  09/11/2007 1600    >60        The eGFR has been calculated using the MDRD equation. This calculation has not been validated in all clinical   CMP     Component Value Date/Time   NA 145* 05/12/2015 1220   NA 143 03/12/2014 0933   K 4.0 05/12/2015 1220   CL 102 05/12/2015 1220   CO2 29 05/12/2015 1220   GLUCOSE 113* 05/12/2015 1220   GLUCOSE 114* 03/12/2014 0933   BUN 22 05/12/2015 1220   BUN 21 03/12/2014 0933   CREATININE 1.14 05/12/2015 1220   CREATININE 0.98 03/12/2014 0933   CALCIUM 9.5 05/12/2015 1220   PROT 6.4 03/12/2014  0933   ALBUMIN 4.1 03/12/2014 0933   AST 19 03/12/2014 0933   ALT 19 03/12/2014 0933   ALKPHOS 64 03/12/2014 0933   BILITOT 0.6 03/12/2014 0933   GFRNONAA 60 05/12/2015 1220   GFRAA 69 05/12/2015 1220       Component Value Date/Time   WBC 6.2 09/11/2007 1600   HGB 13.6 09/13/2007 0801   HGB 12.3* 09/11/2007 1600   HCT 35.8* 09/11/2007 1600   MCV 92.5 09/11/2007 1600    Lipid Panel     Component Value Date/Time   CHOL 132 04/01/2015 0948   CHOL 122 03/12/2014 0933   TRIG 74 04/01/2015 0948   HDL 51 04/01/2015 0948   HDL 43 03/12/2014 0933   CHOLHDL 2.6 04/01/2015 0948   CHOLHDL 2.8 03/12/2014 0933   VLDL 12 03/12/2014 0933   LDLCALC 66 04/01/2015 0948   LDLCALC 67 03/12/2014 0933    ABG No results found for: PHART, PCO2ART, PO2ART, HCO3, TCO2, ACIDBASEDEF, O2SAT   Lab Results  Component Value Date   TSH 1.280 05/12/2015   BNP (last 3 results)  Recent Labs  04/01/15 0829  BNP 136.8*    ProBNP (last 3 results) No results for input(s): PROBNP in the last 8760 hours.  Cardiac Panel (last 3 results) No results for input(s): CKTOTAL, CKMB, TROPONINI, RELINDX in the last 72 hours.  Iron/TIBC/Ferritin/ %Sat No results found for: IRON, TIBC, FERRITIN, IRONPCTSAT   EKG Orders placed or performed in visit on 05/22/15  . EKG     Prior Assessment and Plan Problem List as of 05/26/2015      Cardiovascular and Mediastinum   Essential hypertension, benign   Last Assessment & Plan 06/19/2014 Office Visit Written 06/19/2014  8:59 AM by Satira Sark, MD    No changes made medical regimen today. Keep follow up with Dr. [name].      Coronary atherosclerosis of native coronary artery   Last Assessment & Plan 06/19/2014 Office Visit Written 06/19/2014  8:58 AM by Satira Sark, MD    Symptomatically stable on current medical regimen. We reviewed his medications and a refill for nitroglycerin was provided to ensure that he has a fresh bottle. His ECG is stable.  After discussion, plan is to continue medical therapy and observation, reserving followup stress testing for progressive symptomatology or decline in functional status. We will continue annual followup, although I can certainty see him back sooner if needed.  Respiratory   OSA (obstructive sleep apnea)   Last Assessment & Plan 07/12/2014 Office Visit Written 07/12/2014  9:27 AM by Kathee Delton, MD    The patient seems to be doing well from a sleep apnea standpoint, but will need more pressure. We will get him to a home care company who can better meet his needs, and place his device on the automatic setting. I've also asked him to keep up with his mask changes and supplies.        Endocrine   Type 2 diabetes mellitus with diabetic neuropathy   Last Assessment & Plan 06/19/2014 Office Visit Written 06/19/2014  8:59 AM by Satira Sark, MD    Keep follow up with Dr. [name].        Other   HYPERLIPIDEMIA   Last Assessment & Plan 06/19/2014 Office Visit Written 06/19/2014  8:58 AM by Satira Sark, MD    Tolerating Lipitor with LDL 67 in April.      Forgetfulness       Imaging: Nm Myocar Single W/spect W/wall Motion And Ef  05/22/2015    There was no ST segment deviation noted during stress.  Defect 1: There is a small defect of mild severity present in the basal  inferoseptal, basal inferior and mid inferior location.  The study is normal.  This is a low risk study.  The left ventricular ejection fraction is hyperdynamic (>65%).

## 2015-05-26 NOTE — Progress Notes (Signed)
Cardiology Office Note   Date:  05/26/2015   ID:  Luke Solis, DOB 10-Jul-1931, MRN [id number]  PCP:  Luke Lange, MD  Cardiologist:  Luke Solis/ Luke Sims, NP   Chief Complaint  Patient presents with  . Coronary Artery Disease  . Hypertension      History of Present Illness: Luke Solis is a 82 y.o. male who presents for assessment and management of CAD, with history of bare metal stents.  The proximal and mid RCA and bare metal stent to the circumflex, hypertension, with history of sleep apnea, on CPAP.  The patient was last seen in the office on 05/13/2015.  The patient was scheduled for a Cardiolite exercise stress test, echocardiogram, with complaints of chest discomfort, and fatigue, with last intervention in 2002. .The patient was seen, secondary to bradycardia that was seen approximately 6 weeks ago, by his urologist, with a heart rate of 34.  Due to history of stents to the right coronary artery and circumflex., a stress test was completed to evaluate for ischemia in that territory causing him to have bradycardia.  He was also taken off of metoprolol.   Study revealed small defect of mid severity present in the basal inferior septal basal inferior and mid inferior location.  The defect is nonreducible.  This was found to be a low risk study.  This was completed on 05/22/2015.  Echocardiogram demonstrated LVEF of 60-65%, mitral valve revealed mild regurgitation, the left atrium was mildly dilated, pulmonary artery pressure 34 mmHg.  He comes today feeling much better off the metoprolol.  He still remains bradycardic.  He denies chest pain, significant dyspnea on exertion, or dizziness.  His wife is still concerned about his heart rate and the fact that his energy level is not what it used to be.  Past Medical History  Diagnosis Date  . Coronary atherosclerosis of native coronary artery     BMS proximal and mid RCA as well as BMS circumflex - 2002 (had residual 70% distal LAD),  LVEF 60%  . Essential hypertension, benign   . Hyperlipidemia   . Sleep apnea     On CPAP  . Neuropathy   . Prostate cancer   . NSTEMI (non-ST elevated myocardial infarction)     2002    Past Surgical History  Procedure Laterality Date  . Inguinal hernia repair    . Prostate surgery    . Nose surgery    . Tonsillectomy    . Colonoscopy       Current Outpatient Prescriptions  Medication Sig Dispense Refill  . aspirin 81 MG tablet Take 81 mg by mouth daily.    Marland Kitchen atorvastatin (LIPITOR) 80 MG tablet TAKE (1) TABLET BY MOUTH ONCE DAILY. 30 tablet 0  . Calcium Carbonate-Vitamin D 600-400 MG-UNIT per tablet Take 1 tablet by mouth daily.    . fexofenadine (ALLEGRA) 180 MG tablet Take 180 mg by mouth as needed.    . hydrochlorothiazide (HYDRODIURIL) 25 MG tablet TAKE ONE TABLET DAILY IN THE MORNING. 30 tablet 1  . losartan (COZAAR) 100 MG tablet TAKE 1 TABLET ONCE DAILY. 30 tablet 0  . LYRICA 100 MG capsule TAKE 1 CAPSULE IN THE MORNING AND 3 CAPSULES AT BEDTIME 90 capsule 5  . metFORMIN (GLUCOPHAGE-XR) 500 MG 24 hr tablet TAKE 2 TABLETS ONCE DAILY WITH FOOD. 60 tablet 0  . Multiple Vitamin (MULTIVITAMIN) tablet Take 1 tablet by mouth daily.    . nitroGLYCERIN (NITROSTAT) 0.4 MG SL tablet Place 1  tablet (0.4 mg total) under the tongue every 5 (five) minutes as needed for chest pain. 25 tablet 4   No current facility-administered medications for this visit.    Allergies:   Review of patient's allergies indicates no known allergies.    Social History:  The patient  reports that he has never smoked. He does not have any smokeless tobacco history on file. He reports that he does not drink alcohol or use illicit drugs.   Family History:  The patient's family history includes Coronary artery disease in his father; Diabetes in his mother; Heart attack in his father and mother; Hypertension in his father.    ROS: .   All other systems are reviewed and negative.Unless otherwise mentioned  in H&P above.   PHYSICAL EXAM: VS:  There were no vitals taken for this visit. , BMI There is no weight on file to calculate BMI. GEN: Well nourished, well developed, in no acute distress HEENT: normal Neck: no JVD, carotid bruits, or masses Cardiac: IRRR; no murmurs, rubs, or gallops,no edema bradycardic Respiratory:  clear to auscultation bilaterally, normal work of breathing GI: soft, nontender, nondistended, + BS MS: no deformity or atrophy Skin: warm and dry, no rash Neuro:  Strength and sensation are intact Psych: euthymic mood, full affect  Recent Labs: 04/01/2015: BNP 136.8* 05/12/2015: BUN 22; Creatinine, Ser 1.14; Magnesium 2.0; Potassium 4.0; Sodium 145*; TSH 1.280    Lipid Panel    Component Value Date/Time   CHOL 132 04/01/2015 0948   CHOL 122 03/12/2014 0933   TRIG 74 04/01/2015 0948   HDL 51 04/01/2015 0948   HDL 43 03/12/2014 0933   CHOLHDL 2.6 04/01/2015 0948   CHOLHDL 2.8 03/12/2014 0933   VLDL 12 03/12/2014 0933   LDLCALC 66 04/01/2015 0948   LDLCALC 67 03/12/2014 0933      Wt Readings from Last 3 Encounters:  05/13/15 163 lb (73.936 kg)  05/12/15 164 lb (74.39 kg)  04/29/15 164 lb 12.8 oz (74.753 kg)      Other studies Reviewed: Additional studies/ records that were reviewed today include: None Review of the above records demonstrates: N/A   ASSESSMENT AND PLAN:  1.  CAD: History of the bare metal stents.  The proximal, mid RCA and circumflex. Stress test and echocardiogram were normal.  He continues to have bradycardia despite stopping, metoprolol.  I will place a seven-day cardiac monitor on this patient to evaluate heart rate control off beta blocker for evaluation of atrial fibrillation, rate, pauses, or significant bradycardia.  He will follow-up with Dr. Domenic Solis to discuss test results.  2. Hypertension: Currently controlled on hydrochlorothiazide and losartan.   Current medicines are reviewed at length with the patient today.    Labs/  tests ordered today include: Cardiac Monitor for 7 days. No orders of the defined types were placed in this encounter.     Disposition:   FU with Dr. Domenic Solis in 1 month Signed, Luke Sims, NP  05/26/2015 7:26 AM    Luke Solis. [address], [place], [place] [address] Phone: [phone]; Fax: [phone]

## 2015-05-29 ENCOUNTER — Ambulatory Visit: Payer: Medicare Other

## 2015-05-29 ENCOUNTER — Telehealth: Payer: Self-pay | Admitting: *Deleted

## 2015-05-29 DIAGNOSIS — R001 Bradycardia, unspecified: Secondary | ICD-10-CM | POA: Diagnosis not present

## 2015-05-29 DIAGNOSIS — I4891 Unspecified atrial fibrillation: Secondary | ICD-10-CM | POA: Diagnosis not present

## 2015-05-29 NOTE — Telephone Encounter (Signed)
[employer] called a serious report on the patient of SR with Trigeminal PVC's. Dr. Bronson Ing notified.

## 2015-06-03 LAB — NM MYOCAR SINGLE W/SPECT
CHL CUP MPHR: 139 {beats}/min
CHL RATE OF PERCEIVED EXERTION: 12
Estimated workload: 7 METS
Exercise duration (min): 5 min
Exercise duration (sec): 36 s
Peak HR: 127 {beats}/min
Percent HR: 91 %
Rest HR: 61 {beats}/min

## 2015-06-13 ENCOUNTER — Telehealth: Payer: Self-pay | Admitting: Family Medicine

## 2015-06-13 ENCOUNTER — Other Ambulatory Visit: Payer: Self-pay | Admitting: *Deleted

## 2015-06-13 MED ORDER — METFORMIN HCL ER 500 MG PO TB24: ORAL_TABLET | ORAL | Status: DC

## 2015-06-13 NOTE — Telephone Encounter (Signed)
ERROR

## 2015-06-20 ENCOUNTER — Ambulatory Visit (INDEPENDENT_AMBULATORY_CARE_PROVIDER_SITE_OTHER): Payer: Medicare Other | Admitting: Family Medicine

## 2015-06-20 ENCOUNTER — Ambulatory Visit: Payer: Medicare Other | Admitting: Nurse Practitioner

## 2015-06-20 ENCOUNTER — Encounter: Payer: Self-pay | Admitting: Family Medicine

## 2015-06-20 ENCOUNTER — Telehealth: Payer: Self-pay | Admitting: Family Medicine

## 2015-06-20 VITALS — BP 138/86 | Ht 74.0 in | Wt 166.4 lb

## 2015-06-20 DIAGNOSIS — R197 Diarrhea, unspecified: Secondary | ICD-10-CM | POA: Diagnosis not present

## 2015-06-20 DIAGNOSIS — R5382 Chronic fatigue, unspecified: Secondary | ICD-10-CM

## 2015-06-20 DIAGNOSIS — I2510 Atherosclerotic heart disease of native coronary artery without angina pectoris: Secondary | ICD-10-CM

## 2015-06-20 MED ORDER — PREGABALIN 100 MG PO CAPS: ORAL_CAPSULE | ORAL | Status: DC

## 2015-06-20 NOTE — Telephone Encounter (Signed)
ERROR

## 2015-06-20 NOTE — Progress Notes (Signed)
   Subjective:    Patient ID: Luke Solis, male    DOB: 1931/08/12, 81 y.o.   MRN: [id number]  HPI Patient arrives with c/o diarrhea for one week. Patient also having fatigue. Patient with several different times over the past 10 days of loose stools no blood and no mucus in it occurred maybe 3 or 4 separate times other times bowel movements are normal he does use metformin he does go to the bathroom with bowel movements frequently in addition to this he denies abdominal pain sweats chills he is being treated by Dr. Rosana Hoes with [facility] urology for condition of prostate cancer. Patient experiencing more daytime fatigue and tiredness  Review of Systems  Constitutional: Positive for fatigue. Negative for fever.  HENT: Negative for congestion.   Respiratory: Negative for cough.   Cardiovascular: Negative for chest pain.  Gastrointestinal: Positive for diarrhea. Negative for nausea, constipation and blood in stool.       Objective:   Physical Exam  Heart regular pulse normal extremities no edema skin warm dry neurologic grossly normal abdomen soft      Assessment & Plan:  Diarrhea-probably related to the metformin. May need to change away from the metformin or reduce the dose. Will need to check some lab work first.  Fatigue and tiredness could be related sleep apnea but we need to rule out anemia. Check lab work. May need referral back to sleep specialist for a follow-up sleep study on the current CPAP make sure it is working adequately  Cardiac condition stable

## 2015-06-23 LAB — BASIC METABOLIC PANEL
BUN/Creatinine Ratio: 19 (ref 10–22)
BUN: 19 mg/dL (ref 8–27)
CHLORIDE: 104 mmol/L (ref 97–108)
CO2: 28 mmol/L (ref 18–29)
CREATININE: 1.02 mg/dL (ref 0.76–1.27)
Calcium: 8.9 mg/dL (ref 8.6–10.2)
GFR calc Af Amer: 79 mL/min/{1.73_m2} (ref >59)
GFR calc non Af Amer: 69 mL/min/{1.73_m2} (ref >59)
GLUCOSE: 209 mg/dL — AB (ref 65–99)
Potassium: 4.1 mmol/L (ref 3.5–5.2)
Sodium: 144 mmol/L (ref 134–144)

## 2015-06-23 LAB — CBC WITH DIFFERENTIAL/PLATELET
BASOS ABS: 0 10*3/uL (ref 0.0–0.2)
Basos: 0 %
EOS (ABSOLUTE): 0.3 10*3/uL (ref 0.0–0.4)
Eos: 6 %
Hematocrit: 36.4 % — ABNORMAL LOW (ref 37.5–51.0)
Hemoglobin: 12.5 g/dL — ABNORMAL LOW (ref 12.6–17.7)
Immature Grans (Abs): 0 10*3/uL (ref 0.0–0.1)
Immature Granulocytes: 0 %
LYMPHS: 25 %
Lymphocytes Absolute: 1.3 10*3/uL (ref 0.7–3.1)
MCH: 31.2 pg (ref 26.6–33.0)
MCHC: 34.3 g/dL (ref 31.5–35.7)
MCV: 91 fL (ref 79–97)
MONOCYTES: 8 %
Monocytes Absolute: 0.4 10*3/uL (ref 0.1–0.9)
NEUTROS PCT: 61 %
Neutrophils Absolute: 3.3 10*3/uL (ref 1.4–7.0)
PLATELETS: 136 10*3/uL — AB (ref 150–379)
RBC: 4.01 x10E6/uL — ABNORMAL LOW (ref 4.14–5.80)
RDW: 12.9 % (ref 12.3–15.4)
WBC: 5.3 10*3/uL (ref 3.4–10.8)

## 2015-06-23 LAB — SEDIMENTATION RATE: Sed Rate: 2 mm/hr (ref 0–30)

## 2015-06-23 LAB — TISSUE TRANSGLUTAMINASE, IGA: Transglutaminase IgA: <2 U/mL (ref 0–3)

## 2015-07-01 ENCOUNTER — Other Ambulatory Visit: Payer: Self-pay | Admitting: Family Medicine

## 2015-07-02 ENCOUNTER — Other Ambulatory Visit: Payer: Self-pay | Admitting: *Deleted

## 2015-07-02 MED ORDER — HYDROCHLOROTHIAZIDE 25 MG PO TABS: ORAL_TABLET | ORAL | Status: DC

## 2015-07-10 ENCOUNTER — Ambulatory Visit (INDEPENDENT_AMBULATORY_CARE_PROVIDER_SITE_OTHER): Payer: Medicare Other | Admitting: Cardiology

## 2015-07-10 ENCOUNTER — Encounter: Payer: Self-pay | Admitting: Cardiology

## 2015-07-10 VITALS — BP 142/78 | HR 61 | Ht 74.0 in | Wt 165.4 lb

## 2015-07-10 DIAGNOSIS — I2510 Atherosclerotic heart disease of native coronary artery without angina pectoris: Secondary | ICD-10-CM

## 2015-07-10 DIAGNOSIS — R001 Bradycardia, unspecified: Secondary | ICD-10-CM

## 2015-07-10 DIAGNOSIS — I10 Essential (primary) hypertension: Secondary | ICD-10-CM | POA: Diagnosis not present

## 2015-07-10 NOTE — Patient Instructions (Signed)
Your physician wants you to follow-up in: 6 MONTHS WITH DR. MCDOWELL. You will receive a reminder letter in the mail two months in advance. If you don't receive a letter, please call our office to schedule the follow-up appointment.   Your physician recommends that you continue on your current medications as directed. Please refer to the Current Medication list given to you today.  Thanks for choosing [facility]!!!   

## 2015-07-10 NOTE — Progress Notes (Signed)
Cardiology Office Note  Date: 07/10/2015   ID: Luke Solis, DOB 07/08/1931, MRN [id number]  PCP: Sallee Lange, MD  Primary Cardiologist: Rozann Lesches, MD   Chief Complaint  Patient presents with  . Coronary Artery Disease  . Hypertension  . Hyperlipidemia    History of Present Illness: Luke Solis is an 81 y.o. male last seen by Ms. Lawrence NP in June.  He presents today with his wife for a follow-up visit. He reports doing relatively well, no major functional limitations that have been progressive, no angina symptoms. He has been off Lopressor since prior documentation of bradycardia.  Cardiac testing from June including echocardiogram and Cardiolite are reviewed below, overall reassuring results. He also wore a cardiac monitor later in June which showed sinus rhythm with PVCs, no pauses reported. We discussed the results today.   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 He tries to stay active with that program , also enjoys working outdoors.   Past Medical History  Diagnosis Date  . Coronary atherosclerosis of native coronary artery     BMS proximal and mid RCA as well as BMS circumflex - 2002 (had residual 70% distal LAD), LVEF 60%  . Essential hypertension, benign   . Hyperlipidemia   . Sleep apnea     On CPAP  . Neuropathy   . Prostate cancer   . NSTEMI (non-ST elevated myocardial infarction)     2002    Current Outpatient Prescriptions  Medication Sig Dispense Refill  . aspirin 81 MG tablet Take 81 mg by mouth daily.    Marland Kitchen atorvastatin (LIPITOR) 80 MG tablet TAKE (1) TABLET BY MOUTH ONCE DAILY. 30 tablet 2  . Calcium Carbonate-Vitamin D 600-400 MG-UNIT per tablet Take 1 tablet by mouth daily.    . fexofenadine (ALLEGRA) 180 MG tablet Take 180 mg by mouth as needed.    . hydrochlorothiazide (HYDRODIURIL) 25 MG tablet TAKE 1/2 TABLET DAILY BY MOUTH ONCE DAILY. 15 tablet 2  . losartan (COZAAR) 100 MG tablet TAKE 1 TABLET ONCE DAILY.  30 tablet 2  . metFORMIN (GLUCOPHAGE-XR) 500 MG 24 hr tablet TAKE 2 TABLETS ONCE DAILY WITH FOOD. 60 tablet 2  . Multiple Vitamin (MULTIVITAMIN) tablet Take 1 tablet by mouth daily.    . nitroGLYCERIN (NITROSTAT) 0.4 MG SL tablet Place 1 tablet (0.4 mg total) under the tongue every 5 (five) minutes as needed for chest pain. 25 tablet 4  . pregabalin (LYRICA) 100 MG capsule One capsule in am and 2 capsules at night 90 capsule 5   No current facility-administered medications for this visit.    Allergies:  Review of patient's allergies indicates no known allergies.   Social History: The patient  reports that he has never smoked. He does not have any smokeless tobacco history on file. He reports that he does not drink alcohol or use illicit drugs.   ROS:  Please see the history of present illness. Otherwise, complete review of systems is positive for none.  All other systems are reviewed and negative.   Physical Exam: VS:  BP 142/78 mmHg  Pulse 61  Ht 6\' 2"  (1.88 m)  Wt 165 lb 6.4 oz (75.025 kg)  BMI 21.23 kg/m2  SpO2 97%, BMI Body mass index is 21.23 kg/(m^2).  Wt Readings from Last 3 Encounters:  07/10/15 165 lb 6.4 oz (75.025 kg)  06/20/15 166 lb 6.4 oz (75.479 kg)  05/26/15 163 lb (73.936 kg)  Patient appears comfortable at rest. HEENT: Conjunctiva and lids normal, oropharynx clear. Neck: Supple, no elevated JVP or carotid bruits, no thyromegaly. Lungs: Clear to auscultation, nonlabored breathing at rest. Cardiac: Regular rate and rhythm, no S3, soft systolic murmur, no pericardial rub. Abdomen: Soft, nontender, bowel sounds present, no guarding or rebound. Extremities: No pitting edema, distal pulses 2+.  ECG: Tracing from June showed sinus rhythm with ventricular trigeminy.  Recent Labwork: 04/01/2015: BNP 136.8* 05/12/2015: Magnesium 2.0; TSH 1.280 06/20/2015: BUN 19; Creatinine, Ser 1.02; Potassium 4.1; Sodium 144     Component Value Date/Time   CHOL 132 04/01/2015  0948   CHOL 122 03/12/2014 0933   TRIG 74 04/01/2015 0948   HDL 51 04/01/2015 0948   HDL 43 03/12/2014 0933   CHOLHDL 2.6 04/01/2015 0948   CHOLHDL 2.8 03/12/2014 0933   VLDL 12 03/12/2014 0933   LDLCALC 66 04/01/2015 0948   LDLCALC 67 03/12/2014 0933    Other Studies Reviewed Today:  Exercise Cardiolite 05/22/2015 showed no diagnostic ST segment changes, fixed small defect of mild intensity in the basal inferoseptal and mid to basal inferior wall with LVEF greater than 65%. Overall low risk. Patient did achieve 85% MPHR.  Echocardiogram 05/22/2015: Study Conclusions  - Left ventricle: The cavity size was normal. Wall thickness was normal. Systolic function was normal. The estimated ejection fraction was in the range of 60% to 65%. - Mitral valve: There was mild regurgitation. - Right atrium: The atrium was mildly dilated. - Pulmonary arteries: PA peak pressure: 34 mm Hg (S).  ASSESSMENT AND PLAN:  1.  Previous document of bradycardia, heart rate has improved off Lopressor. Cardiac monitoring did not demonstrate any significant bradycardia or pauses, and we will continue observation for now.  2.  CAD status post BMS to the proximal and mid RCA as well as circumflex back in 2002 with residual distal LAD disease that was managed medically. Recent follow-up exercise Cardiolite was low risk as outlined.  LVEF remains normal range.  3.  Essential hypertension , no changes made to current regimen. Keep follow-up with Dr. [name].  Current medicines were reviewed at length with the patient today.  Disposition: FU with me in 6 months.   Signed, Satira Sark, MD, [facility] 07/10/2015 9:45 AM    [facility] [address], [place], [place] [address] Phone: [phone]; Fax: [phone]

## 2015-07-30 ENCOUNTER — Other Ambulatory Visit: Payer: Self-pay | Admitting: Family Medicine

## 2015-08-06 DIAGNOSIS — R972 Elevated prostate specific antigen [PSA]: Secondary | ICD-10-CM | POA: Diagnosis not present

## 2015-08-06 DIAGNOSIS — C61 Malignant neoplasm of prostate: Secondary | ICD-10-CM | POA: Diagnosis not present

## 2015-08-23 ENCOUNTER — Other Ambulatory Visit: Payer: Self-pay | Admitting: Family Medicine

## 2015-08-27 ENCOUNTER — Ambulatory Visit (INDEPENDENT_AMBULATORY_CARE_PROVIDER_SITE_OTHER): Payer: Medicare Other | Admitting: Family Medicine

## 2015-08-27 ENCOUNTER — Encounter: Payer: Self-pay | Admitting: Family Medicine

## 2015-08-27 VITALS — BP 132/86 | Ht 74.0 in | Wt 162.8 lb

## 2015-08-27 DIAGNOSIS — I2510 Atherosclerotic heart disease of native coronary artery without angina pectoris: Secondary | ICD-10-CM | POA: Diagnosis not present

## 2015-08-27 DIAGNOSIS — E1140 Type 2 diabetes mellitus with diabetic neuropathy, unspecified: Secondary | ICD-10-CM

## 2015-08-27 MED ORDER — NORTRIPTYLINE HCL 25 MG PO CAPS: 25.0000 mg | ORAL_CAPSULE | Freq: Every day | ORAL | Status: DC

## 2015-08-27 NOTE — Progress Notes (Addendum)
   Subjective:    Patient ID: Luke Solis, male    DOB: [birth year], 81 y.o.   MRN: [id number]  HPI Patient arrives for c/o knee to foot pain -worse for last 3 days. Patient has hx of neuropathy.  Progressive pain fronm neuropathy. He feels like the same pain. Both legs. Deep burning in nature. Primarily in the feet extending somewhat up into the calves bilateral.  lyrica got drowsy with highter doses now on lower dose. Helping some but not as much      Review of Systems No chest pain no shortness breath no knee pain no injury    Objective:   Physical Exam  Alert vital stable distress lungs clear. Heart regular in rhythm. Feet pulses excellent bilateral diminished sensation left greater than right negative Homans sign negative calf tenderness knee good range of motion      Assessment & Plan:  Impression progressive neuropathic pain discussed plan add nortriptyline 25 daily at bedtime to current regimen. Symptom care discussed follow-up with Dr. Nicki Reaper as scheduled multiple questions answered patient had many thoughtful good questions. Easily 25 minutes spent with patient today most in discussion and responding to internal concerns WSL

## 2015-08-27 NOTE — Addendum Note (Signed)
Addended by: Margaretmary Eddy on: 08/27/2015 07:57 PM   Modules accepted: Level of Service

## 2015-09-24 ENCOUNTER — Other Ambulatory Visit: Payer: Self-pay | Admitting: Family Medicine

## 2015-09-25 NOTE — Telephone Encounter (Signed)
May refill 6

## 2015-10-22 ENCOUNTER — Ambulatory Visit (INDEPENDENT_AMBULATORY_CARE_PROVIDER_SITE_OTHER): Payer: Medicare Other | Admitting: Family Medicine

## 2015-10-22 ENCOUNTER — Encounter: Payer: Self-pay | Admitting: Family Medicine

## 2015-10-22 VITALS — BP 130/70 | Temp 98.2°F | Wt 163.0 lb

## 2015-10-22 DIAGNOSIS — R5383 Other fatigue: Secondary | ICD-10-CM

## 2015-10-22 DIAGNOSIS — I10 Essential (primary) hypertension: Secondary | ICD-10-CM | POA: Diagnosis not present

## 2015-10-22 DIAGNOSIS — E1140 Type 2 diabetes mellitus with diabetic neuropathy, unspecified: Secondary | ICD-10-CM

## 2015-10-22 DIAGNOSIS — Z23 Encounter for immunization: Secondary | ICD-10-CM

## 2015-10-22 DIAGNOSIS — I2510 Atherosclerotic heart disease of native coronary artery without angina pectoris: Secondary | ICD-10-CM

## 2015-10-22 DIAGNOSIS — C61 Malignant neoplasm of prostate: Secondary | ICD-10-CM | POA: Insufficient documentation

## 2015-10-22 DIAGNOSIS — E785 Hyperlipidemia, unspecified: Secondary | ICD-10-CM

## 2015-10-22 MED ORDER — NORTRIPTYLINE HCL 10 MG PO CAPS: ORAL_CAPSULE | ORAL | Status: DC

## 2015-10-22 NOTE — Progress Notes (Signed)
   Subjective:    Patient ID: Luke Solis, male    DOB: 1931-11-25, 81 y.o.   MRN: [id number]  HPI This patient states he's been feeling bad over the past couple weeks he describes himself as low energy feeling dizzy when he stands up denies chest pressure tightness. States his appetite not as good as normal. Is drinking. Bowels are moving no blood in the bowel movement no hematuria. Does state he gets up frequently urinate at night but he attributes this to his prostate he does relate the last several visits with his urologist his PSA was going up indicating advancement of prostate cancer. In addition to this patient does relate some vague discomforts all over but no specific pains or discomfort. He has a history of diabetes states his sugars have looked good he also has a history of painful neuropathy recently put on nortriptyline to help this which seemed to help. He is tolerating his other medications.   Review of Systems He denies any chest tightness pressure pain shortness of breath he does relate fatigue tiredness. Denies fever chills sweats. Denies abdominal pain no nausea or vomiting denies diarrhea or bloody stools denies joint pain does relate global weakness tiredness and fatigue.    Objective:   Physical Exam Neck no masses lungs are clear no crackles heart regular abdomen is soft a should then no guarding or rebound extremities neuropathy in the feet skin warm dry neurologic grossly normal blood pressure checked laying sitting standing shows significant orthostatic change   25 minutes was spent with the patient. Greater than half the time was spent in discussion and answering questions and counseling regarding the issues that the patient came in for today.     Assessment & Plan:   #1 orthostatic hypotension reduce nortriptyline use 10 mg capsule start off one at nighttime see if that helps neuropathy if it does stick with that dose if not may go up to to follow-up again within 3-4  weeks to recheck orthostasis   prostate cancer patient states PSAs been gradually going up we will check the PSA patient has a follow-up with urology in the coming weeks we will share the result with urology  Patient has sleep apnea he uses his CPAP machine on a regular basis but states after about 45 hours typically he will wake up with it often will not put it back on after that I encouraged patient to use the machine for at least 4 hours every night to help with his overall health.   diabetes issue has not had A1c recently continue medication watch diet check A1c  Hyperlipidemia taking medication watching diet need to check lab work await the results of this  hypertension decent control currently although I am concerned about the orthostasis. Hopefully changing the nortriptyline will help this when the patient follows up in several weeks if he still has orthostasis then we will recommend making changes in his blood pressure medicine potentially reducing the dose  Fatigue and tiredness I believe this is related to the fact that he is on the nortriptyline hopefully a lower dose will alleviate that issue.

## 2015-10-23 DIAGNOSIS — C61 Malignant neoplasm of prostate: Secondary | ICD-10-CM | POA: Diagnosis not present

## 2015-10-23 DIAGNOSIS — I10 Essential (primary) hypertension: Secondary | ICD-10-CM | POA: Diagnosis not present

## 2015-10-23 DIAGNOSIS — E1140 Type 2 diabetes mellitus with diabetic neuropathy, unspecified: Secondary | ICD-10-CM | POA: Diagnosis not present

## 2015-10-23 DIAGNOSIS — E785 Hyperlipidemia, unspecified: Secondary | ICD-10-CM | POA: Diagnosis not present

## 2015-10-23 DIAGNOSIS — R5383 Other fatigue: Secondary | ICD-10-CM | POA: Diagnosis not present

## 2015-10-24 LAB — LIPID PANEL
CHOLESTEROL TOTAL: 139 mg/dL (ref 100–199)
Chol/HDL Ratio: 2.8 ratio units (ref 0.0–5.0)
HDL: 50 mg/dL (ref >39)
LDL CALC: 75 mg/dL (ref 0–99)
TRIGLYCERIDES: 68 mg/dL (ref 0–149)
VLDL Cholesterol Cal: 14 mg/dL (ref 5–40)

## 2015-10-24 LAB — BASIC METABOLIC PANEL
BUN/Creatinine Ratio: 19 (ref 10–22)
BUN: 21 mg/dL (ref 8–27)
CHLORIDE: 101 mmol/L (ref 97–106)
CO2: 28 mmol/L (ref 18–29)
Calcium: 9.3 mg/dL (ref 8.6–10.2)
Creatinine, Ser: 1.1 mg/dL (ref 0.76–1.27)
GFR calc Af Amer: 72 mL/min/{1.73_m2} (ref >59)
GFR calc non Af Amer: 62 mL/min/{1.73_m2} (ref >59)
Glucose: 127 mg/dL — ABNORMAL HIGH (ref 65–99)
POTASSIUM: 3.6 mmol/L (ref 3.5–5.2)
Sodium: 142 mmol/L (ref 136–144)

## 2015-10-24 LAB — HEPATIC FUNCTION PANEL
ALT: 23 IU/L (ref 0–44)
AST: 26 IU/L (ref 0–40)
Albumin: 4.3 g/dL (ref 3.5–4.7)
Alkaline Phosphatase: 66 IU/L (ref 39–117)
Bilirubin Total: 0.6 mg/dL (ref 0.0–1.2)
Bilirubin, Direct: 0.17 mg/dL (ref 0.00–0.40)
Total Protein: 6.7 g/dL (ref 6.0–8.5)

## 2015-10-24 LAB — CBC WITH DIFFERENTIAL/PLATELET
BASOS: 0 %
Basophils Absolute: 0 10*3/uL (ref 0.0–0.2)
EOS (ABSOLUTE): 0.3 10*3/uL (ref 0.0–0.4)
EOS: 6 %
HEMATOCRIT: 37.3 % — AB (ref 37.5–51.0)
Hemoglobin: 13 g/dL (ref 12.6–17.7)
IMMATURE GRANULOCYTES: 0 %
Immature Grans (Abs): 0 10*3/uL (ref 0.0–0.1)
LYMPHS ABS: 1.2 10*3/uL (ref 0.7–3.1)
Lymphs: 24 %
MCH: 31.6 pg (ref 26.6–33.0)
MCHC: 34.9 g/dL (ref 31.5–35.7)
MCV: 91 fL (ref 79–97)
MONOS ABS: 0.5 10*3/uL (ref 0.1–0.9)
Monocytes: 9 %
NEUTROS ABS: 3 10*3/uL (ref 1.4–7.0)
Neutrophils: 61 %
Platelets: 171 10*3/uL (ref 150–379)
RBC: 4.11 x10E6/uL — ABNORMAL LOW (ref 4.14–5.80)
RDW: 13.1 % (ref 12.3–15.4)
WBC: 4.9 10*3/uL (ref 3.4–10.8)

## 2015-10-24 LAB — HEMOGLOBIN A1C
ESTIMATED AVERAGE GLUCOSE: 146 mg/dL
HEMOGLOBIN A1C: 6.7 % — AB (ref 4.8–5.6)

## 2015-10-24 LAB — MICROALBUMIN / CREATININE URINE RATIO
Creatinine, Urine: 114.7 mg/dL
MICROALB/CREAT RATIO: 40.1 mg/g creat — ABNORMAL HIGH (ref 0.0–30.0)
MICROALBUM., U, RANDOM: 46 ug/mL

## 2015-10-24 LAB — PSA: Prostate Specific Ag, Serum: 15.7 ng/mL — ABNORMAL HIGH (ref 0.0–4.0)

## 2015-11-10 DIAGNOSIS — N3941 Urge incontinence: Secondary | ICD-10-CM | POA: Diagnosis not present

## 2015-11-10 DIAGNOSIS — R9721 Rising PSA following treatment for malignant neoplasm of prostate: Secondary | ICD-10-CM | POA: Diagnosis not present

## 2015-11-10 DIAGNOSIS — C61 Malignant neoplasm of prostate: Secondary | ICD-10-CM | POA: Diagnosis not present

## 2015-11-10 DIAGNOSIS — N3040 Irradiation cystitis without hematuria: Secondary | ICD-10-CM | POA: Diagnosis not present

## 2015-11-10 DIAGNOSIS — R972 Elevated prostate specific antigen [PSA]: Secondary | ICD-10-CM | POA: Diagnosis not present

## 2015-12-01 ENCOUNTER — Other Ambulatory Visit: Payer: Self-pay | Admitting: Family Medicine

## 2015-12-17 DIAGNOSIS — E119 Type 2 diabetes mellitus without complications: Secondary | ICD-10-CM | POA: Diagnosis not present

## 2015-12-17 DIAGNOSIS — H2513 Age-related nuclear cataract, bilateral: Secondary | ICD-10-CM | POA: Diagnosis not present

## 2015-12-17 DIAGNOSIS — H538 Other visual disturbances: Secondary | ICD-10-CM | POA: Diagnosis not present

## 2015-12-17 LAB — HM DIABETES EYE EXAM

## 2015-12-29 DIAGNOSIS — R972 Elevated prostate specific antigen [PSA]: Secondary | ICD-10-CM | POA: Diagnosis not present

## 2015-12-30 DIAGNOSIS — R972 Elevated prostate specific antigen [PSA]: Secondary | ICD-10-CM | POA: Diagnosis not present

## 2015-12-30 DIAGNOSIS — R938 Abnormal findings on diagnostic imaging of other specified body structures: Secondary | ICD-10-CM | POA: Diagnosis not present

## 2015-12-30 DIAGNOSIS — Z955 Presence of coronary angioplasty implant and graft: Secondary | ICD-10-CM | POA: Diagnosis not present

## 2016-01-01 ENCOUNTER — Other Ambulatory Visit: Payer: Self-pay | Admitting: Family Medicine

## 2016-01-26 ENCOUNTER — Other Ambulatory Visit: Payer: Self-pay | Admitting: Family Medicine

## 2016-01-26 DIAGNOSIS — R9721 Rising PSA following treatment for malignant neoplasm of prostate: Secondary | ICD-10-CM | POA: Diagnosis not present

## 2016-02-02 DIAGNOSIS — C61 Malignant neoplasm of prostate: Secondary | ICD-10-CM | POA: Diagnosis not present

## 2016-02-16 ENCOUNTER — Encounter: Payer: Self-pay | Admitting: *Deleted

## 2016-02-23 ENCOUNTER — Other Ambulatory Visit: Payer: Self-pay | Admitting: Family Medicine

## 2016-03-04 ENCOUNTER — Encounter: Payer: Self-pay | Admitting: Cardiology

## 2016-03-04 ENCOUNTER — Ambulatory Visit (INDEPENDENT_AMBULATORY_CARE_PROVIDER_SITE_OTHER): Payer: Medicare Other | Admitting: Cardiology

## 2016-03-04 VITALS — BP 144/82 | HR 70 | Ht 74.0 in | Wt 165.0 lb

## 2016-03-04 DIAGNOSIS — I10 Essential (primary) hypertension: Secondary | ICD-10-CM | POA: Diagnosis not present

## 2016-03-04 DIAGNOSIS — I2510 Atherosclerotic heart disease of native coronary artery without angina pectoris: Secondary | ICD-10-CM | POA: Diagnosis not present

## 2016-03-04 DIAGNOSIS — Z9989 Dependence on other enabling machines and devices: Secondary | ICD-10-CM

## 2016-03-04 DIAGNOSIS — J302 Other seasonal allergic rhinitis: Secondary | ICD-10-CM

## 2016-03-04 DIAGNOSIS — G4733 Obstructive sleep apnea (adult) (pediatric): Secondary | ICD-10-CM

## 2016-03-04 DIAGNOSIS — E785 Hyperlipidemia, unspecified: Secondary | ICD-10-CM | POA: Diagnosis not present

## 2016-03-04 NOTE — Patient Instructions (Signed)
Your physician wants you to follow-up in: 6 months with Dr McDowell You will receive a reminder letter in the mail two months in advance. If you don't receive a letter, please call our office to schedule the follow-up appointment.     Your physician recommends that you continue on your current medications as directed. Please refer to the Current Medication list given to you today.    If you need a refill on your cardiac medications before your next appointment, please call your pharmacy.     Thank you for choosing [facility] !        

## 2016-03-04 NOTE — Progress Notes (Signed)
Cardiology Office Note  Date: 03/04/2016   ID: Luke Solis, DOB 1931/06/02, MRN [id number]  PCP: Sallee Lange, MD  Primary Cardiologist: Rozann Lesches, MD   Chief Complaint  Patient presents with  . Coronary Artery Disease    History of Present Illness: Luke Solis is an 82 y.o. male last seen in August 2016. He presents for a routine follow-up visit with his wife. Remains active as tolerated, spent yesterday washing all the windows in his house with his wife. He does not report any angina symptoms or nitroglycerin glycerin use. He reports NYHA class II dyspnea with typical activities.  He does use CPAP for obstructive sleep apnea. His wife states that he still snores he does have daytime somnolence.  I reviewed his medications. Cardiac regimen includes aspirin, Lipitor, hydrochlorothiazide, Cozaar, and as needed nitroglycerin.  He had a follow-up exercise Cardiolite last year which was overall low risk. We have continued medical therapy. LVEF is normal.  I reviewed his most recent lab work.  Past Medical History  Diagnosis Date  . Coronary atherosclerosis of native coronary artery     BMS proximal and mid RCA as well as BMS circumflex - 2002 (had residual 70% distal LAD), LVEF 60%  . Essential hypertension, benign   . Hyperlipidemia   . Sleep apnea     On CPAP  . Neuropathy ([place])   . Prostate cancer ([place])   . NSTEMI (non-ST elevated myocardial infarction) ([place])     2002    Past Surgical History  Procedure Laterality Date  . Inguinal hernia repair    . Prostate surgery    . Nose surgery    . Tonsillectomy    . Colonoscopy      Current Outpatient Prescriptions  Medication Sig Dispense Refill  . aspirin 81 MG tablet Take 81 mg by mouth daily.    Marland Kitchen atorvastatin (LIPITOR) 80 MG tablet TAKE (1) TABLET BY MOUTH ONCE DAILY. 30 tablet 5  . Calcium Carbonate-Vitamin D 600-400 MG-UNIT per tablet Take 1 tablet by mouth daily.    . fexofenadine (ALLEGRA) 180 MG tablet  Take 180 mg by mouth as needed.    . hydrochlorothiazide (HYDRODIURIL) 25 MG tablet TAKE 1/2 TABLET DAILY. 12 tablet 2  . losartan (COZAAR) 100 MG tablet TAKE (1) TABLET BY MOUTH ONCE DAILY. 30 tablet 1  . LYRICA 100 MG capsule TAKE 1 CAPSULE IN THE MORNING AND 3 CAPSULES AT BEDTIME 90 capsule 6  . metFORMIN (GLUCOPHAGE-XR) 500 MG 24 hr tablet TAKE 2 TABLETS ONCE DAILY WITH FOOD. 60 tablet 5  . Multiple Vitamin (MULTIVITAMIN) tablet Take 1 tablet by mouth daily.    . nitroGLYCERIN (NITROSTAT) 0.4 MG SL tablet Place 1 tablet (0.4 mg total) under the tongue every 5 (five) minutes as needed for chest pain. 25 tablet 4  . nortriptyline (PAMELOR) 10 MG capsule 1 to 2 qhs as directed 60 capsule 5   No current facility-administered medications for this visit.   Allergies:  Review of patient's allergies indicates no known allergies.   Social History: The patient  reports that he has never smoked. He does not have any smokeless tobacco history on file. He reports that he does not drink alcohol or use illicit drugs.   ROS:  Please see the history of present illness. Otherwise, complete review of systems is positive for gradual softening of his voice. Postnasal drip..  All other systems are reviewed and negative.   Physical Exam: VS:  BP 144/82 mmHg  Pulse 70  Ht 6\' 2"  (1.88 m)  Wt 165 lb (74.844 kg)  BMI 21.18 kg/m2  SpO2 97%, BMI Body mass index is 21.18 kg/(m^2).  Wt Readings from Last 3 Encounters:  03/04/16 165 lb (74.844 kg)  10/22/15 163 lb (73.936 kg)  08/27/15 162 lb 12.8 oz (73.846 kg)    Patient appears comfortable at rest. HEENT: Conjunctiva and lids normal, oropharynx clear. Neck: Supple, no elevated JVP or carotid bruits, no thyromegaly. Lungs: Clear to auscultation, nonlabored breathing at rest. Cardiac: Regular rate and rhythm, no S3, soft systolic murmur, no pericardial rub. Abdomen: Soft, nontender, bowel sounds present, no guarding or rebound. Extremities: No pitting  edema, distal pulses 2+.  ECG: ECG is not ordered today.  Recent Labwork: 04/01/2015: BNP 136.8* 05/12/2015: Magnesium 2.0; TSH 1.280 10/23/2015: ALT 23; AST 26; BUN 21; Creatinine, Ser 1.10; Platelets 171; Potassium 3.6; Sodium 142     Component Value Date/Time   CHOL 139 10/23/2015 0831   CHOL 122 03/12/2014 0933   TRIG 68 10/23/2015 0831   HDL 50 10/23/2015 0831   HDL 43 03/12/2014 0933   CHOLHDL 2.8 10/23/2015 0831   CHOLHDL 2.8 03/12/2014 0933   VLDL 12 03/12/2014 0933   LDLCALC 75 10/23/2015 0831   LDLCALC 67 03/12/2014 0933    Other Studies Reviewed Today:  Exercise Cardiolite 05/22/2015: No diagnostic ST segment changes, fixed small defect of mild intensity in the basal inferoseptal and mid to basal inferior wall with LVEF greater than 65%. Overall low risk. Patient did achieve 85% MPHR.  Echocardiogram 05/22/2015: Study Conclusions  - Left ventricle: The cavity size was normal. Wall thickness was normal. Systolic function was normal. The estimated ejection fraction was in the range of 60% to 65%. - Mitral valve: There was mild regurgitation. - Right atrium: The atrium was mildly dilated. - Pulmonary arteries: PA peak pressure: 34 mm Hg (S).  Assessment and Plan:  1. Symptomatically stable CAD status post previous BMS interventions to the RCA and circumflex as outlined above with moderate residual LAD disease. He had a low risk Cardiolite study from last year. Plan is to continue medical therapy and follow-up.  2. Essential hypertension, no changes made present regimen with systolic blood pressure in the 140s.  3. Hyperlipidemia, on Lipitor. Recent LDL was 75.  4. Reports trouble with softening voice and postnasal drip. Asked him to try over-the-counter Claritin with known difficulty with seasonal allergies.  5. Obstructive sleep apnea on CPAP. Reports compliance although his wife states that he still snores and sometimes the seal leaks. I asked him to check with  his pulmonologist to see if he may need a follow up titration sleep study.  Current medicines were reviewed with the patient today.  Disposition: FU with me in 6 months.   Signed, Satira Sark, MD, [facility] 03/04/2016 3:44 PM    [place] [facility] [address], [place], [place] [address] Phone: [phone]; Fax: [phone]

## 2016-03-09 ENCOUNTER — Ambulatory Visit (INDEPENDENT_AMBULATORY_CARE_PROVIDER_SITE_OTHER): Payer: Medicare Other | Admitting: Family Medicine

## 2016-03-09 ENCOUNTER — Encounter: Payer: Self-pay | Admitting: Family Medicine

## 2016-03-09 VITALS — BP 144/96 | Temp 98.1°F | Ht 74.0 in | Wt 163.0 lb

## 2016-03-09 DIAGNOSIS — Z8546 Personal history of malignant neoplasm of prostate: Secondary | ICD-10-CM | POA: Diagnosis not present

## 2016-03-09 DIAGNOSIS — M545 Low back pain, unspecified: Secondary | ICD-10-CM

## 2016-03-09 DIAGNOSIS — E119 Type 2 diabetes mellitus without complications: Secondary | ICD-10-CM | POA: Diagnosis not present

## 2016-03-09 DIAGNOSIS — I440 Atrioventricular block, first degree: Secondary | ICD-10-CM | POA: Diagnosis not present

## 2016-03-09 DIAGNOSIS — I491 Atrial premature depolarization: Secondary | ICD-10-CM | POA: Diagnosis not present

## 2016-03-09 DIAGNOSIS — I2510 Atherosclerotic heart disease of native coronary artery without angina pectoris: Secondary | ICD-10-CM | POA: Diagnosis not present

## 2016-03-09 DIAGNOSIS — G4733 Obstructive sleep apnea (adult) (pediatric): Secondary | ICD-10-CM | POA: Diagnosis not present

## 2016-03-09 DIAGNOSIS — Z01810 Encounter for preprocedural cardiovascular examination: Secondary | ICD-10-CM | POA: Diagnosis not present

## 2016-03-09 DIAGNOSIS — C61 Malignant neoplasm of prostate: Secondary | ICD-10-CM | POA: Diagnosis not present

## 2016-03-09 DIAGNOSIS — N3040 Irradiation cystitis without hematuria: Secondary | ICD-10-CM | POA: Diagnosis not present

## 2016-03-09 DIAGNOSIS — I2119 ST elevation (STEMI) myocardial infarction involving other coronary artery of inferior wall: Secondary | ICD-10-CM | POA: Diagnosis not present

## 2016-03-09 DIAGNOSIS — I10 Essential (primary) hypertension: Secondary | ICD-10-CM | POA: Diagnosis not present

## 2016-03-09 MED ORDER — PREDNISONE 20 MG PO TABS: ORAL_TABLET | ORAL | Status: DC

## 2016-03-09 NOTE — Progress Notes (Signed)
   Subjective:    Patient ID: Luke Solis, male    DOB: [birth year], 82 y.o.   MRN: [id number]  Urinary Frequency  This is a new problem. The current episode started yesterday. Associated symptoms include frequency. Associated symptoms comments: Low back pain.  Patient is going to be having of test [facility] to freeze prostae. Has to a procedure schedule on April 11th to freeze prostate.  Pain with certain movements when he moves briefly he gets a sharp pain in the left lower back does not radiate down the leg no weakness with it.   Review of Systems  Genitourinary: Positive for frequency.  Patient denies fever dysuria hematuria abdominal discomfort Testing and notes were reviewed from [facility] he had a MRI in late January did not show any bone involvement did show prostate cancer    Objective:   Physical Exam  Lungs clear heart regular low back subjective tenderness especially in the left lower back increased pain with flexion forward decrease with extension backwards negative straight leg raise Unable to give urine specimen     Assessment & Plan:  Musculoskeletal back pain recommend stretching exercises also short course of prednisone taper should follow-up if ongoing troubles. If high fevers or worse follow-up no sign of cancer involvement.

## 2016-03-16 DIAGNOSIS — G4733 Obstructive sleep apnea (adult) (pediatric): Secondary | ICD-10-CM | POA: Diagnosis not present

## 2016-03-16 DIAGNOSIS — I10 Essential (primary) hypertension: Secondary | ICD-10-CM | POA: Diagnosis not present

## 2016-03-16 DIAGNOSIS — N3040 Irradiation cystitis without hematuria: Secondary | ICD-10-CM | POA: Diagnosis not present

## 2016-03-16 DIAGNOSIS — C61 Malignant neoplasm of prostate: Secondary | ICD-10-CM | POA: Diagnosis not present

## 2016-03-16 DIAGNOSIS — E119 Type 2 diabetes mellitus without complications: Secondary | ICD-10-CM | POA: Diagnosis not present

## 2016-03-16 DIAGNOSIS — I2510 Atherosclerotic heart disease of native coronary artery without angina pectoris: Secondary | ICD-10-CM | POA: Diagnosis not present

## 2016-03-16 DIAGNOSIS — E118 Type 2 diabetes mellitus with unspecified complications: Secondary | ICD-10-CM | POA: Diagnosis not present

## 2016-03-17 ENCOUNTER — Other Ambulatory Visit: Payer: Self-pay | Admitting: Family Medicine

## 2016-03-17 DIAGNOSIS — I10 Essential (primary) hypertension: Secondary | ICD-10-CM | POA: Diagnosis not present

## 2016-03-17 DIAGNOSIS — E119 Type 2 diabetes mellitus without complications: Secondary | ICD-10-CM | POA: Diagnosis not present

## 2016-03-17 DIAGNOSIS — C61 Malignant neoplasm of prostate: Secondary | ICD-10-CM | POA: Diagnosis not present

## 2016-03-17 DIAGNOSIS — N3040 Irradiation cystitis without hematuria: Secondary | ICD-10-CM | POA: Diagnosis not present

## 2016-03-17 DIAGNOSIS — I2510 Atherosclerotic heart disease of native coronary artery without angina pectoris: Secondary | ICD-10-CM | POA: Diagnosis not present

## 2016-03-17 DIAGNOSIS — G4733 Obstructive sleep apnea (adult) (pediatric): Secondary | ICD-10-CM | POA: Diagnosis not present

## 2016-04-05 DIAGNOSIS — C61 Malignant neoplasm of prostate: Secondary | ICD-10-CM | POA: Diagnosis not present

## 2016-04-23 ENCOUNTER — Other Ambulatory Visit: Payer: Self-pay | Admitting: Family Medicine

## 2016-04-23 NOTE — Telephone Encounter (Signed)
Ok times 6

## 2016-05-10 ENCOUNTER — Encounter: Payer: Self-pay | Admitting: Family Medicine

## 2016-05-10 ENCOUNTER — Ambulatory Visit (INDEPENDENT_AMBULATORY_CARE_PROVIDER_SITE_OTHER): Payer: Medicare Other | Admitting: Family Medicine

## 2016-05-10 VITALS — BP 130/80 | Temp 98.0°F | Ht 74.0 in | Wt 163.5 lb

## 2016-05-10 DIAGNOSIS — J019 Acute sinusitis, unspecified: Secondary | ICD-10-CM

## 2016-05-10 DIAGNOSIS — B9689 Other specified bacterial agents as the cause of diseases classified elsewhere: Secondary | ICD-10-CM

## 2016-05-10 DIAGNOSIS — I2510 Atherosclerotic heart disease of native coronary artery without angina pectoris: Secondary | ICD-10-CM

## 2016-05-10 MED ORDER — CEFPROZIL 500 MG PO TABS: 500.0000 mg | ORAL_TABLET | Freq: Two times a day (BID) | ORAL | Status: DC

## 2016-05-10 NOTE — Progress Notes (Signed)
   Subjective:    Patient ID: Luke Solis, male    DOB: [birth year], 82 y.o.   MRN: [id number]  Sinusitis This is a new problem. The current episode started in the past 7 days. The problem is unchanged. There has been no fever. The pain is moderate. Associated symptoms include congestion, coughing, headaches and a sore throat. Pertinent negatives include no ear pain. (Runny nose) Past treatments include acetaminophen. The treatment provided no relief.   Patient states that he just had his prostate frozen. Just FYI for the doctor.   having some denies dysuria fevers  Review of Systems  Constitutional: Negative for fever and activity change.  HENT: Positive for congestion, rhinorrhea and sore throat. Negative for ear pain.   Eyes: Negative for discharge.  Respiratory: Positive for cough. Negative for wheezing.   Cardiovascular: Negative for chest pain.  Neurological: Positive for headaches.       Objective:   Physical Exam  Constitutional: He appears well-developed.  HENT:  Head: Normocephalic.  Mouth/Throat: Oropharynx is clear and moist. No oropharyngeal exudate.  Neck: Normal range of motion.  Cardiovascular: Normal rate, regular rhythm and normal heart sounds.   No murmur heard. Pulmonary/Chest: Effort normal and breath sounds normal. He has no wheezes.  Lymphadenopathy:    He has no cervical adenopathy.  Neurological: He exhibits normal muscle tone.  Skin: Skin is warm and dry.  Nursing note and vitals reviewed.         Assessment & Plan:  Patient was seen today for upper respiratory illness. It is felt that the patient is dealing with sinusitis. Antibiotics were prescribed today. Importance of compliance with medication was discussed. Symptoms should gradually resolve over the course of the next several days. If high fevers, progressive illness, difficulty breathing, worsening condition or failure for symptoms to improve over the next several days then the patient is to  follow-up. If any emergent conditions the patient is to follow-up in the emergency department otherwise to follow-up in the office.    patient being followed by urology for prostate cancer they just did a freezing of causing some mild incontinence issues   Patient will follow-up in the fall with his other issues chronic health issues

## 2016-05-31 ENCOUNTER — Other Ambulatory Visit: Payer: Self-pay | Admitting: Family Medicine

## 2016-05-31 DIAGNOSIS — N3941 Urge incontinence: Secondary | ICD-10-CM | POA: Diagnosis not present

## 2016-05-31 DIAGNOSIS — C61 Malignant neoplasm of prostate: Secondary | ICD-10-CM | POA: Diagnosis not present

## 2016-05-31 DIAGNOSIS — N3946 Mixed incontinence: Secondary | ICD-10-CM | POA: Insufficient documentation

## 2016-07-08 ENCOUNTER — Other Ambulatory Visit: Payer: Self-pay | Admitting: Family Medicine

## 2016-07-22 ENCOUNTER — Other Ambulatory Visit: Payer: Self-pay | Admitting: Family Medicine

## 2016-08-04 ENCOUNTER — Other Ambulatory Visit: Payer: Self-pay | Admitting: Family Medicine

## 2016-08-06 ENCOUNTER — Other Ambulatory Visit: Payer: Self-pay | Admitting: Family Medicine

## 2016-08-06 NOTE — Telephone Encounter (Signed)
Ok six ref 

## 2016-08-16 ENCOUNTER — Encounter: Payer: Self-pay | Admitting: Family Medicine

## 2016-08-16 ENCOUNTER — Ambulatory Visit (INDEPENDENT_AMBULATORY_CARE_PROVIDER_SITE_OTHER): Payer: Medicare Other | Admitting: Family Medicine

## 2016-08-16 VITALS — BP 140/90 | Ht 74.0 in | Wt 164.1 lb

## 2016-08-16 DIAGNOSIS — Z23 Encounter for immunization: Secondary | ICD-10-CM

## 2016-08-16 DIAGNOSIS — S8002XA Contusion of left knee, initial encounter: Secondary | ICD-10-CM

## 2016-08-16 DIAGNOSIS — I2510 Atherosclerotic heart disease of native coronary artery without angina pectoris: Secondary | ICD-10-CM | POA: Diagnosis not present

## 2016-08-16 NOTE — Progress Notes (Signed)
   Subjective:    Patient ID: Luke Solis, male    DOB: [birth year], 82 y.o.   MRN: [id number]  Fall  The accident occurred more than 1 week ago. The fall occurred while walking. He fell from a height of 3 to 5 ft. He landed on concrete. The point of impact was the left elbow and left knee. The pain is present in the left knee. He has tried nothing for the symptoms.   Results for orders placed or performed in visit on 01/12/16  HM DIABETES EYE EXAM  Result Value Ref Range   HM Diabetic Eye Exam No Retinopathy No Retinopathy    Knee hurt elbow hurt also   Has tried neosporin   Pt was filling not the best , ten bag s of concrete . So he did get tired somewhat before the fall. Tick a pretty bad tumble. This was last Wednesday  Feels unsteady, not always cking bp regulaly . Sometimes if he stands quickly he gets transiently lightheaded.   No meds no  Review of Systems No headache, no major weight loss or weight gain, no chest pain no back pain abdominal pain no change in bowel habits complete ROS otherwise negative     Objective:   Physical Exam Alert vitals stable, NAD. Blood pressure good on repeat. HEENT normal. Lungs clear. Heart regular rate and rhythm.  Left knee positive crepitations no joint laxity. Superficial abrasion. Elbow superficial abrasion       Assessment & Plan:  Impression 1 knee contusion without abrasion highly doubt fracture discussed plan flu shot today. He has formed when necessary. Tylenol or Aleve when necessary. Hold off and x-rays. Patient was halfway hopeful we would do a chronic disease assessment on him. I pointed out he has not had one these for 9 months with Dr. Nicki Reaper. Do A1c lipid liver reassessment of all his chronic concerns we will schedule

## 2016-08-24 ENCOUNTER — Other Ambulatory Visit: Payer: Self-pay | Admitting: Family Medicine

## 2016-08-30 ENCOUNTER — Ambulatory Visit (INDEPENDENT_AMBULATORY_CARE_PROVIDER_SITE_OTHER): Payer: Medicare Other | Admitting: Family Medicine

## 2016-08-30 VITALS — BP 124/70 | Ht 74.0 in

## 2016-08-30 DIAGNOSIS — E785 Hyperlipidemia, unspecified: Secondary | ICD-10-CM | POA: Diagnosis not present

## 2016-08-30 DIAGNOSIS — E1140 Type 2 diabetes mellitus with diabetic neuropathy, unspecified: Secondary | ICD-10-CM

## 2016-08-30 DIAGNOSIS — I10 Essential (primary) hypertension: Secondary | ICD-10-CM | POA: Diagnosis not present

## 2016-08-30 DIAGNOSIS — I2510 Atherosclerotic heart disease of native coronary artery without angina pectoris: Secondary | ICD-10-CM | POA: Diagnosis not present

## 2016-08-30 DIAGNOSIS — C61 Malignant neoplasm of prostate: Secondary | ICD-10-CM

## 2016-08-30 DIAGNOSIS — E119 Type 2 diabetes mellitus without complications: Secondary | ICD-10-CM | POA: Diagnosis not present

## 2016-08-30 LAB — POCT GLYCOSYLATED HEMOGLOBIN (HGB A1C): HEMOGLOBIN A1C: 6.7

## 2016-08-30 MED ORDER — LOSARTAN POTASSIUM 25 MG PO TABS: 25.0000 mg | ORAL_TABLET | Freq: Every day | ORAL | 5 refills | Status: DC

## 2016-08-30 MED ORDER — NORTRIPTYLINE HCL 10 MG PO CAPS: ORAL_CAPSULE | ORAL | 0 refills | Status: DC

## 2016-08-30 MED ORDER — PREGABALIN 100 MG PO CAPS: ORAL_CAPSULE | ORAL | 5 refills | Status: DC

## 2016-08-30 MED ORDER — METFORMIN HCL 500 MG PO TABS: ORAL_TABLET | ORAL | 5 refills | Status: DC

## 2016-08-30 NOTE — Progress Notes (Signed)
Subjective:     Patient ID: Luke Solis, male   DOB: [birth year], 82 y.o.   MRN: [id number]  HPI Patient comes in today for a follow-up he takes his cholesterol medicine on a regular basis tries watch his diet tolerates metformin in addition to this uses Lyrica on a regular basis along with nortriptyline for neuropathy in his feet also takes hydrochlorothiazide occasionally gets a little dizzy when he stands up he did have a fall recently was seen by Dr. Richardson Landry in addition to this he also gets woozy when he stands up at times. He also has prostate cancer he is followed by specialist they did a special treatment on him and he is having frequent urination as result.  Review of Systems He denies any chest tightness pressure pain shortness of breath nausea vomiting diarrhea relates weakness. Wife states he's walking little bit slow. Cognitively he is doing well    Objective:   Physical Exam HEENT is benign neck no masses lungs are clear no crackle heart regular abdomen is soft extremities no edema skin warm dry neurologic grossly normal blood pressure sitting and standing shows 110/77 approximate 96/68 standing    Assessment:     Prostate cancer Diabetes Diabetic neuropathy Hypertension Hyperlipidemia    orthostatic hypotension Plan:     Hyperlipidemia continue cholesterol medicine recent lab work reviewed with patient Orthostatic hypotension reduce Cozaar. Recheck patient in approximately 4 weeks  Diabetic neuropathy use Lyrica as directed patient using reduced dose he will let us know if 2 in the evening doesn't work, continue nortriptyline Patient taking 81 mg aspirin per cardiology instruction Lipid profile indicated previous labs reviewed CBC because of prostate cancer indicated previous labs reviewed, V8A stable, metabolic 7, follow through with urologist regarding prostate cancer  25 minutes was spent with the patient. Greater than half the time was spent in discussion and answering  questions and counseling regarding the issues that the patient came in for today. Previous lab work reviewed with the patient Work ordered await results Follow-up 2 months to recheck blood pressure losartan reduced to 25 mg daily he was on 50

## 2016-08-31 ENCOUNTER — Other Ambulatory Visit: Payer: Self-pay | Admitting: Family Medicine

## 2016-09-03 DIAGNOSIS — C61 Malignant neoplasm of prostate: Secondary | ICD-10-CM | POA: Diagnosis not present

## 2016-09-03 DIAGNOSIS — E785 Hyperlipidemia, unspecified: Secondary | ICD-10-CM | POA: Diagnosis not present

## 2016-09-03 DIAGNOSIS — I10 Essential (primary) hypertension: Secondary | ICD-10-CM | POA: Diagnosis not present

## 2016-09-04 LAB — CBC WITH DIFFERENTIAL/PLATELET
BASOS: 0 %
Basophils Absolute: 0 10*3/uL (ref 0.0–0.2)
EOS (ABSOLUTE): 0.3 10*3/uL (ref 0.0–0.4)
EOS: 6 %
HEMATOCRIT: 38.5 % (ref 37.5–51.0)
HEMOGLOBIN: 13.3 g/dL (ref 12.6–17.7)
Immature Grans (Abs): 0 10*3/uL (ref 0.0–0.1)
Immature Granulocytes: 0 %
LYMPHS ABS: 1.4 10*3/uL (ref 0.7–3.1)
Lymphs: 24 %
MCH: 30.9 pg (ref 26.6–33.0)
MCHC: 34.5 g/dL (ref 31.5–35.7)
MCV: 90 fL (ref 79–97)
MONOS ABS: 0.4 10*3/uL (ref 0.1–0.9)
Monocytes: 7 %
NEUTROS ABS: 3.6 10*3/uL (ref 1.4–7.0)
Neutrophils: 63 %
Platelets: 175 10*3/uL (ref 150–379)
RBC: 4.3 x10E6/uL (ref 4.14–5.80)
RDW: 12.7 % (ref 12.3–15.4)
WBC: 5.7 10*3/uL (ref 3.4–10.8)

## 2016-09-04 LAB — LIPID PANEL
CHOL/HDL RATIO: 2.8 ratio (ref 0.0–5.0)
Cholesterol, Total: 153 mg/dL (ref 100–199)
HDL: 55 mg/dL (ref >39)
LDL CALC: 84 mg/dL (ref 0–99)
Triglycerides: 70 mg/dL (ref 0–149)
VLDL Cholesterol Cal: 14 mg/dL (ref 5–40)

## 2016-09-04 LAB — BASIC METABOLIC PANEL
BUN / CREAT RATIO: 20 (ref 10–24)
BUN: 21 mg/dL (ref 8–27)
CALCIUM: 9.5 mg/dL (ref 8.6–10.2)
CHLORIDE: 103 mmol/L (ref 96–106)
CO2: 26 mmol/L (ref 18–29)
CREATININE: 1.04 mg/dL (ref 0.76–1.27)
GFR calc non Af Amer: 66 mL/min/{1.73_m2} (ref >59)
GFR, EST AFRICAN AMERICAN: 76 mL/min/{1.73_m2} (ref >59)
GLUCOSE: 136 mg/dL — AB (ref 65–99)
Potassium: 4.3 mmol/L (ref 3.5–5.2)
Sodium: 143 mmol/L (ref 134–144)

## 2016-09-04 LAB — HEPATIC FUNCTION PANEL
ALT: 18 IU/L (ref 0–44)
AST: 20 IU/L (ref 0–40)
Albumin: 4.4 g/dL (ref 3.5–4.7)
Alkaline Phosphatase: 75 IU/L (ref 39–117)
BILIRUBIN TOTAL: 0.6 mg/dL (ref 0.0–1.2)
BILIRUBIN, DIRECT: 0.17 mg/dL (ref 0.00–0.40)
TOTAL PROTEIN: 6.8 g/dL (ref 6.0–8.5)

## 2016-09-05 ENCOUNTER — Encounter: Payer: Self-pay | Admitting: Family Medicine

## 2016-09-06 DIAGNOSIS — Z8546 Personal history of malignant neoplasm of prostate: Secondary | ICD-10-CM | POA: Diagnosis not present

## 2016-09-06 DIAGNOSIS — N3941 Urge incontinence: Secondary | ICD-10-CM | POA: Diagnosis not present

## 2016-10-24 IMAGING — DX DG CHEST SPECIAL VIEW
2 series · 2 of 2 positions shown · non-contrast
Comparison: 04/01/2015

CLINICAL DATA: Lung nodules.  Evaluate for nipple shadow

EXAM:
CHEST SPECIAL VIEW

[chest apicogram (1 of 2)]
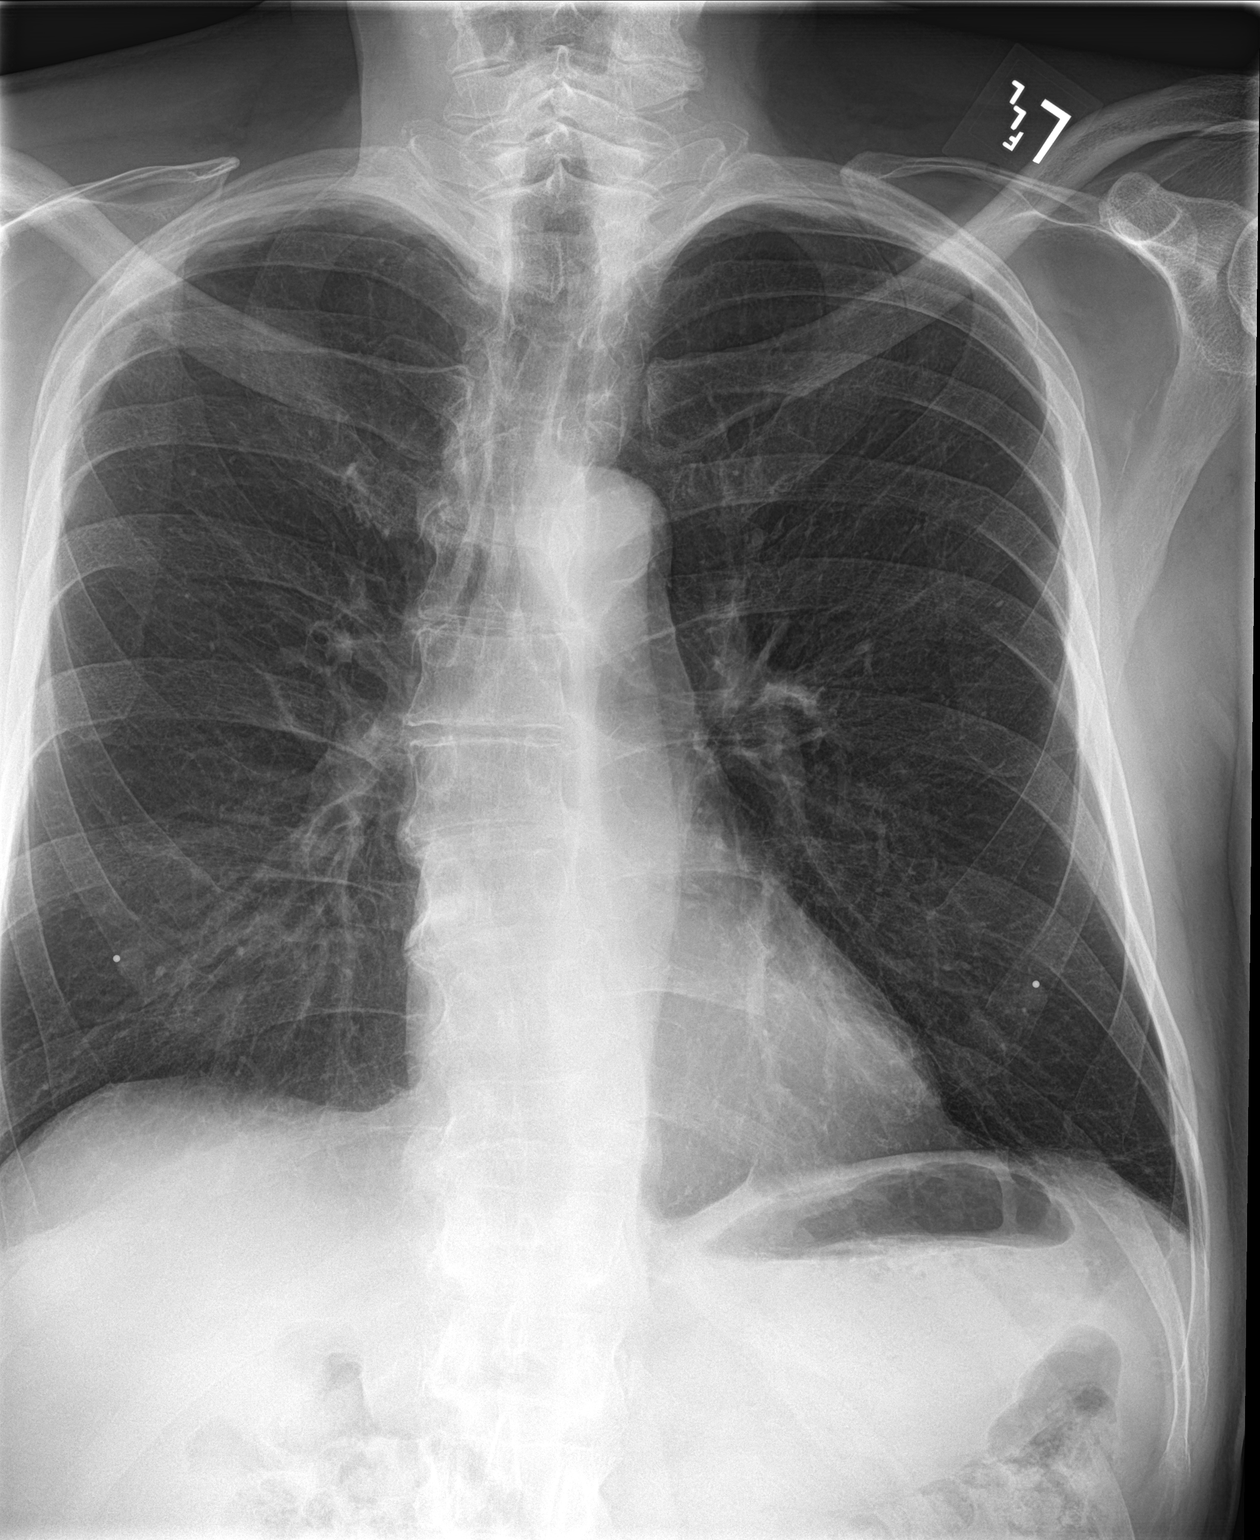

[chest apicogram (2 of 2)]
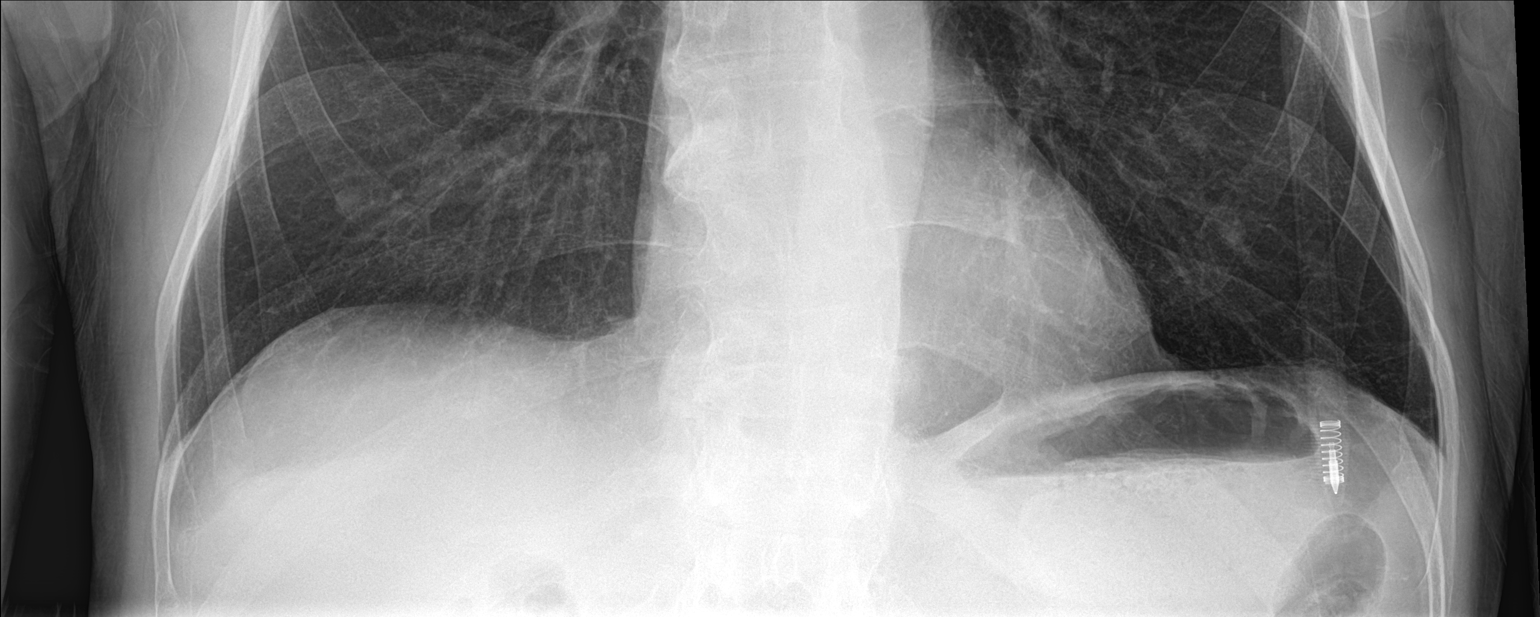

[2 of 2 positions shown; findings below may reference images not displayed]

FINDINGS: Nipple markers correspond to bibasilar nodular densities on the
prior chest x-ray consistent with nipple shadows. Lungs otherwise
clear
IMPRESSION: Bibasilar lung nodules correspond to the nipple shadows.

## 2016-11-01 ENCOUNTER — Encounter: Payer: Self-pay | Admitting: Family Medicine

## 2016-11-01 ENCOUNTER — Ambulatory Visit: Payer: Medicare Other | Admitting: Family Medicine

## 2016-11-01 ENCOUNTER — Ambulatory Visit (INDEPENDENT_AMBULATORY_CARE_PROVIDER_SITE_OTHER): Payer: Medicare Other | Admitting: Family Medicine

## 2016-11-01 VITALS — BP 130/84 | Ht 74.0 in | Wt 167.0 lb

## 2016-11-01 DIAGNOSIS — I10 Essential (primary) hypertension: Secondary | ICD-10-CM | POA: Diagnosis not present

## 2016-11-01 DIAGNOSIS — I2510 Atherosclerotic heart disease of native coronary artery without angina pectoris: Secondary | ICD-10-CM | POA: Diagnosis not present

## 2016-11-01 DIAGNOSIS — E1140 Type 2 diabetes mellitus with diabetic neuropathy, unspecified: Secondary | ICD-10-CM | POA: Diagnosis not present

## 2016-11-01 NOTE — Progress Notes (Signed)
   Subjective:    Patient ID: Luke Solis, male    DOB: 01/07/1931, 82 y.o.   MRN: [id number]  Hypertension  This is a chronic problem. Risk factors for coronary artery disease include diabetes mellitus and male gender. Treatments tried: losartin. There are no compliance problems.    Patient arrives to follow up on blood pressure since changing losartin This patient states the lower dose seems to be working out fine he denies any major problems  He does relate bilateral foot numbness with burning. Using Lyrica in the evening time cannot take it during the day because it makes him drowsy  Review of Systems See above. Denies chest tightness pressure pain shortness breath dizziness    Objective:   Physical Exam Orthostatics are negative. Blood pressure in the range of 130/70 lungs clear hearts regular diabetic foot exam shows coolness to the toes but normal pulses. Does have neuropathy.       Assessment & Plan:  Neuropathy both feet continue current medication. A1c under good control and the past follow-up in the spring lab work reviewed in detail with patient  Orthostatics negative on current blood pressure continue current measures. Follow-up in the spring

## 2016-11-15 DIAGNOSIS — N393 Stress incontinence (female) (male): Secondary | ICD-10-CM | POA: Diagnosis not present

## 2016-11-15 DIAGNOSIS — C61 Malignant neoplasm of prostate: Secondary | ICD-10-CM | POA: Diagnosis not present

## 2016-12-10 ENCOUNTER — Other Ambulatory Visit: Payer: Self-pay | Admitting: Family Medicine

## 2016-12-27 DIAGNOSIS — C61 Malignant neoplasm of prostate: Secondary | ICD-10-CM | POA: Diagnosis not present

## 2016-12-27 DIAGNOSIS — N35012 Post-traumatic membranous urethral stricture: Secondary | ICD-10-CM | POA: Diagnosis not present

## 2016-12-27 DIAGNOSIS — N393 Stress incontinence (female) (male): Secondary | ICD-10-CM | POA: Diagnosis not present

## 2016-12-27 DIAGNOSIS — N35014 Post-traumatic urethral stricture, male, unspecified: Secondary | ICD-10-CM | POA: Insufficient documentation

## 2016-12-30 ENCOUNTER — Other Ambulatory Visit: Payer: Self-pay | Admitting: Family Medicine

## 2017-01-21 DIAGNOSIS — N3946 Mixed incontinence: Secondary | ICD-10-CM | POA: Diagnosis not present

## 2017-01-26 DIAGNOSIS — L609 Nail disorder, unspecified: Secondary | ICD-10-CM | POA: Diagnosis not present

## 2017-01-26 DIAGNOSIS — M79671 Pain in right foot: Secondary | ICD-10-CM | POA: Diagnosis not present

## 2017-01-26 DIAGNOSIS — E1140 Type 2 diabetes mellitus with diabetic neuropathy, unspecified: Secondary | ICD-10-CM | POA: Diagnosis not present

## 2017-01-26 DIAGNOSIS — M79672 Pain in left foot: Secondary | ICD-10-CM | POA: Diagnosis not present

## 2017-01-28 DIAGNOSIS — N3946 Mixed incontinence: Secondary | ICD-10-CM | POA: Diagnosis not present

## 2017-01-28 DIAGNOSIS — C61 Malignant neoplasm of prostate: Secondary | ICD-10-CM | POA: Diagnosis not present

## 2017-01-28 DIAGNOSIS — N35012 Post-traumatic membranous urethral stricture: Secondary | ICD-10-CM | POA: Diagnosis not present

## 2017-02-23 ENCOUNTER — Other Ambulatory Visit: Payer: Self-pay | Admitting: Family Medicine

## 2017-03-25 ENCOUNTER — Other Ambulatory Visit: Payer: Self-pay | Admitting: Family Medicine

## 2017-03-28 ENCOUNTER — Other Ambulatory Visit: Payer: Self-pay | Admitting: Family Medicine

## 2017-03-29 NOTE — Telephone Encounter (Signed)
I believe we just did this? May have this +5 refills

## 2017-04-05 ENCOUNTER — Encounter: Payer: Self-pay | Admitting: Family Medicine

## 2017-04-05 ENCOUNTER — Ambulatory Visit (INDEPENDENT_AMBULATORY_CARE_PROVIDER_SITE_OTHER): Payer: Medicare Other | Admitting: Family Medicine

## 2017-04-05 VITALS — BP 130/82 | Ht 74.0 in | Wt 163.0 lb

## 2017-04-05 DIAGNOSIS — G4733 Obstructive sleep apnea (adult) (pediatric): Secondary | ICD-10-CM | POA: Diagnosis not present

## 2017-04-05 DIAGNOSIS — R5383 Other fatigue: Secondary | ICD-10-CM | POA: Diagnosis not present

## 2017-04-05 DIAGNOSIS — E119 Type 2 diabetes mellitus without complications: Secondary | ICD-10-CM

## 2017-04-05 DIAGNOSIS — I10 Essential (primary) hypertension: Secondary | ICD-10-CM

## 2017-04-05 DIAGNOSIS — R498 Other voice and resonance disorders: Secondary | ICD-10-CM

## 2017-04-05 DIAGNOSIS — E7849 Other hyperlipidemia: Secondary | ICD-10-CM

## 2017-04-05 DIAGNOSIS — R490 Dysphonia: Secondary | ICD-10-CM | POA: Diagnosis not present

## 2017-04-05 DIAGNOSIS — E784 Other hyperlipidemia: Secondary | ICD-10-CM | POA: Diagnosis not present

## 2017-04-05 DIAGNOSIS — E1140 Type 2 diabetes mellitus with diabetic neuropathy, unspecified: Secondary | ICD-10-CM | POA: Diagnosis not present

## 2017-04-05 LAB — POCT GLYCOSYLATED HEMOGLOBIN (HGB A1C): HEMOGLOBIN A1C: 7.6

## 2017-04-05 NOTE — Progress Notes (Signed)
   Subjective:    Patient ID: Luke Solis, male    DOB: 1930/12/18, 83 y.o.   MRN: [id number]  Hypertension  This is a chronic problem. The current episode started more than 1 year ago.  The patient takes his blood pressure medicine as directed denies any problems no chest pain or shortness of breath  Bilateral foot pain. Has neuropathy of the feet related to diabetes he does admit that he eats too much sweets he does take his medicines he states a Lyrica does help him with his foot pain although not as good as he would like  A1C today. 7.6 does not do a great job with diet does try to stay somewhat active denies chest tightness pressure pain denies blurred vision  Weak voice relates that this been going on for a while at least 6 months weakness of the voice gets worse as the day goes on also with some hoarseness does not smoke  Poor balance state has a hard time with balance in certain situations has hold onto things no unilateral numbness or weakness no falls this at one time year ago he fell backwards no injuries know.  Patient with moderate fatigue tiredness feeling rundown at times weeks a lot during the day has sleep apnea does not think his machine is working well   Review of Systems Please see above denies headaches blurred vision chest pain shortness breath denies abdominal pain and hematuria hematochezia denies joint pain positive for neuropathy and pain and discomfort from the neuropathy    Objective:   Physical Exam Neck no masses lungs clear no crackles heart regular blood pressure sitting and standing are good abdomen soft extremities no edema venous insufficiency noted in the feet pulses diminished. No ulcers are seen patient with neuropathy Finger to nose normal his gait is normal  25 minutes was spent with the patient. Greater than half the time was spent in discussion and answering questions and counseling regarding the issues that the patient came in for today.        Assessment & Plan:  Diabetes A1c 7.6 because of his age I recommend his A1c be between 7. 0-7.9 Continue current medications Back on sweets recheck A1c in 4 months  Blood pressure good continue current measures no orthostasis  Sleep apnea referral to sleep specialist. Probable will need them to re-titrate his machine  Neuropathy patient may start taking nortriptyline 2 in the evening. Continue Lyrica  Hyperlipidemia check lab work continue medication previous labs to be  Significant fatigue check thyroid function recent CBC lipid  Hoarseness with weakness of voice referral to ENT for further evaluation.  Follow-up 4 months

## 2017-04-06 ENCOUNTER — Other Ambulatory Visit: Payer: Self-pay | Admitting: *Deleted

## 2017-04-06 ENCOUNTER — Encounter: Payer: Self-pay | Admitting: Family Medicine

## 2017-04-06 DIAGNOSIS — E1140 Type 2 diabetes mellitus with diabetic neuropathy, unspecified: Secondary | ICD-10-CM | POA: Diagnosis not present

## 2017-04-06 DIAGNOSIS — E784 Other hyperlipidemia: Secondary | ICD-10-CM | POA: Diagnosis not present

## 2017-04-06 DIAGNOSIS — I10 Essential (primary) hypertension: Secondary | ICD-10-CM | POA: Diagnosis not present

## 2017-04-06 DIAGNOSIS — R5383 Other fatigue: Secondary | ICD-10-CM | POA: Diagnosis not present

## 2017-04-07 ENCOUNTER — Encounter: Payer: Self-pay | Admitting: Family Medicine

## 2017-04-07 LAB — BASIC METABOLIC PANEL
BUN/Creatinine Ratio: 18 (ref 10–24)
BUN: 20 mg/dL (ref 8–27)
CHLORIDE: 102 mmol/L (ref 96–106)
CO2: 27 mmol/L (ref 18–29)
Calcium: 9.1 mg/dL (ref 8.6–10.2)
Creatinine, Ser: 1.13 mg/dL (ref 0.76–1.27)
GFR calc Af Amer: 69 mL/min/{1.73_m2} (ref >59)
GFR calc non Af Amer: 60 mL/min/{1.73_m2} (ref >59)
GLUCOSE: 137 mg/dL — AB (ref 65–99)
POTASSIUM: 3.8 mmol/L (ref 3.5–5.2)
SODIUM: 143 mmol/L (ref 134–144)

## 2017-04-07 LAB — HEPATIC FUNCTION PANEL
ALK PHOS: 69 IU/L (ref 39–117)
ALT: 22 IU/L (ref 0–44)
AST: 22 IU/L (ref 0–40)
Albumin: 4.2 g/dL (ref 3.5–4.7)
Bilirubin Total: 0.5 mg/dL (ref 0.0–1.2)
Bilirubin, Direct: 0.16 mg/dL (ref 0.00–0.40)
Total Protein: 6.5 g/dL (ref 6.0–8.5)

## 2017-04-07 LAB — MICROALBUMIN / CREATININE URINE RATIO
CREATININE, UR: 170 mg/dL
MICROALB/CREAT RATIO: 74.5 mg/g{creat} — AB (ref 0.0–30.0)
Microalbumin, Urine: 126.7 ug/mL

## 2017-04-07 LAB — LIPID PANEL
CHOLESTEROL TOTAL: 129 mg/dL (ref 100–199)
Chol/HDL Ratio: 2.7 ratio (ref 0.0–5.0)
HDL: 48 mg/dL (ref >39)
LDL CALC: 67 mg/dL (ref 0–99)
Triglycerides: 68 mg/dL (ref 0–149)
VLDL CHOLESTEROL CAL: 14 mg/dL (ref 5–40)

## 2017-04-07 LAB — TSH: TSH: 2.58 u[IU]/mL (ref 0.450–4.500)

## 2017-04-07 MED ORDER — NORTRIPTYLINE HCL 10 MG PO CAPS: ORAL_CAPSULE | ORAL | 12 refills | Status: DC

## 2017-04-28 ENCOUNTER — Ambulatory Visit (INDEPENDENT_AMBULATORY_CARE_PROVIDER_SITE_OTHER): Payer: Medicare Other | Admitting: Otolaryngology

## 2017-04-28 DIAGNOSIS — R490 Dysphonia: Secondary | ICD-10-CM | POA: Diagnosis not present

## 2017-04-28 DIAGNOSIS — J382 Nodules of vocal cords: Secondary | ICD-10-CM | POA: Diagnosis not present

## 2017-04-29 DIAGNOSIS — N3946 Mixed incontinence: Secondary | ICD-10-CM | POA: Diagnosis not present

## 2017-04-29 DIAGNOSIS — N35013 Post-traumatic anterior urethral stricture: Secondary | ICD-10-CM | POA: Diagnosis not present

## 2017-04-29 DIAGNOSIS — C61 Malignant neoplasm of prostate: Secondary | ICD-10-CM | POA: Diagnosis not present

## 2017-05-03 ENCOUNTER — Other Ambulatory Visit: Payer: Self-pay | Admitting: Family Medicine

## 2017-05-12 DIAGNOSIS — L609 Nail disorder, unspecified: Secondary | ICD-10-CM | POA: Diagnosis not present

## 2017-05-12 DIAGNOSIS — M79671 Pain in right foot: Secondary | ICD-10-CM | POA: Diagnosis not present

## 2017-05-12 DIAGNOSIS — E1140 Type 2 diabetes mellitus with diabetic neuropathy, unspecified: Secondary | ICD-10-CM | POA: Diagnosis not present

## 2017-05-12 DIAGNOSIS — M79672 Pain in left foot: Secondary | ICD-10-CM | POA: Diagnosis not present

## 2017-05-30 ENCOUNTER — Other Ambulatory Visit: Payer: Self-pay | Admitting: Family Medicine

## 2017-05-30 NOTE — Telephone Encounter (Signed)
Last seen 04/05/17

## 2017-05-30 NOTE — Telephone Encounter (Signed)
Refill times 6

## 2017-05-31 ENCOUNTER — Other Ambulatory Visit: Payer: Self-pay | Admitting: Family Medicine

## 2017-05-31 ENCOUNTER — Institutional Professional Consult (permissible substitution): Payer: Medicare Other | Admitting: Pulmonary Disease

## 2017-06-27 ENCOUNTER — Other Ambulatory Visit: Payer: Self-pay | Admitting: Family Medicine

## 2017-06-27 DIAGNOSIS — H02831 Dermatochalasis of right upper eyelid: Secondary | ICD-10-CM | POA: Diagnosis not present

## 2017-06-27 DIAGNOSIS — H40013 Open angle with borderline findings, low risk, bilateral: Secondary | ICD-10-CM | POA: Diagnosis not present

## 2017-06-27 DIAGNOSIS — H02834 Dermatochalasis of left upper eyelid: Secondary | ICD-10-CM | POA: Diagnosis not present

## 2017-06-27 DIAGNOSIS — H2513 Age-related nuclear cataract, bilateral: Secondary | ICD-10-CM | POA: Diagnosis not present

## 2017-06-27 DIAGNOSIS — E119 Type 2 diabetes mellitus without complications: Secondary | ICD-10-CM | POA: Diagnosis not present

## 2017-06-27 DIAGNOSIS — H40033 Anatomical narrow angle, bilateral: Secondary | ICD-10-CM | POA: Diagnosis not present

## 2017-06-27 DIAGNOSIS — H43813 Vitreous degeneration, bilateral: Secondary | ICD-10-CM | POA: Diagnosis not present

## 2017-06-27 LAB — HM DIABETES EYE EXAM

## 2017-07-14 ENCOUNTER — Encounter: Payer: Self-pay | Admitting: Family Medicine

## 2017-07-19 ENCOUNTER — Other Ambulatory Visit: Payer: Self-pay | Admitting: *Deleted

## 2017-07-19 ENCOUNTER — Other Ambulatory Visit: Payer: Self-pay | Admitting: Family Medicine

## 2017-07-19 DIAGNOSIS — E119 Type 2 diabetes mellitus without complications: Secondary | ICD-10-CM

## 2017-07-20 ENCOUNTER — Other Ambulatory Visit: Payer: Self-pay | Admitting: Family Medicine

## 2017-07-20 DIAGNOSIS — E119 Type 2 diabetes mellitus without complications: Secondary | ICD-10-CM | POA: Diagnosis not present

## 2017-07-21 DIAGNOSIS — E1140 Type 2 diabetes mellitus with diabetic neuropathy, unspecified: Secondary | ICD-10-CM | POA: Diagnosis not present

## 2017-07-21 DIAGNOSIS — M79672 Pain in left foot: Secondary | ICD-10-CM | POA: Diagnosis not present

## 2017-07-21 DIAGNOSIS — M79671 Pain in right foot: Secondary | ICD-10-CM | POA: Diagnosis not present

## 2017-07-21 DIAGNOSIS — L609 Nail disorder, unspecified: Secondary | ICD-10-CM | POA: Diagnosis not present

## 2017-07-21 LAB — BASIC METABOLIC PANEL
BUN/Creatinine Ratio: 15 (ref 10–24)
BUN: 18 mg/dL (ref 8–27)
CALCIUM: 9.4 mg/dL (ref 8.6–10.2)
CO2: 27 mmol/L (ref 20–29)
CREATININE: 1.24 mg/dL (ref 0.76–1.27)
Chloride: 102 mmol/L (ref 96–106)
GFR calc Af Amer: 61 mL/min/{1.73_m2} (ref >59)
GFR calc non Af Amer: 53 mL/min/{1.73_m2} — ABNORMAL LOW (ref >59)
GLUCOSE: 156 mg/dL — AB (ref 65–99)
POTASSIUM: 3.9 mmol/L (ref 3.5–5.2)
SODIUM: 144 mmol/L (ref 134–144)

## 2017-07-21 LAB — HEMOGLOBIN A1C
ESTIMATED AVERAGE GLUCOSE: 180 mg/dL
HEMOGLOBIN A1C: 7.9 % — AB (ref 4.8–5.6)

## 2017-07-22 ENCOUNTER — Ambulatory Visit (INDEPENDENT_AMBULATORY_CARE_PROVIDER_SITE_OTHER): Payer: Medicare Other | Admitting: Family Medicine

## 2017-07-22 ENCOUNTER — Encounter: Payer: Self-pay | Admitting: Family Medicine

## 2017-07-22 VITALS — BP 124/80 | Ht 74.0 in | Wt 166.0 lb

## 2017-07-22 DIAGNOSIS — I10 Essential (primary) hypertension: Secondary | ICD-10-CM | POA: Diagnosis not present

## 2017-07-22 DIAGNOSIS — E1140 Type 2 diabetes mellitus with diabetic neuropathy, unspecified: Secondary | ICD-10-CM | POA: Diagnosis not present

## 2017-07-22 DIAGNOSIS — G4733 Obstructive sleep apnea (adult) (pediatric): Secondary | ICD-10-CM

## 2017-07-22 DIAGNOSIS — E784 Other hyperlipidemia: Secondary | ICD-10-CM | POA: Diagnosis not present

## 2017-07-22 DIAGNOSIS — E7849 Other hyperlipidemia: Secondary | ICD-10-CM

## 2017-07-22 MED ORDER — NITROGLYCERIN 0.4 MG SL SUBL: 0.4000 mg | SUBLINGUAL_TABLET | SUBLINGUAL | 4 refills | Status: DC | PRN

## 2017-07-22 MED ORDER — NORTRIPTYLINE HCL 10 MG PO CAPS: ORAL_CAPSULE | ORAL | 12 refills | Status: DC

## 2017-07-22 NOTE — Patient Instructions (Signed)
Diabetes Mellitus and Food It is important for you to manage your blood sugar (glucose) level. Your blood glucose level can be greatly affected by what you eat. Eating healthier foods in the appropriate amounts throughout the day at about the same time each day will help you control your blood glucose level. It can also help slow or prevent worsening of your diabetes mellitus. Healthy eating may even help you improve the level of your blood pressure and reach or maintain a healthy weight. General recommendations for healthful eating and cooking habits include:  Eating meals and snacks regularly. Avoid going long periods of time without eating to lose weight.  Eating a diet that consists mainly of plant-based foods, such as fruits, vegetables, nuts, legumes, and whole grains.  Using low-heat cooking methods, such as baking, instead of high-heat cooking methods, such as deep frying.  Work with your dietitian to make sure you understand how to use the Nutrition Facts information on food labels. How can food affect me? Carbohydrates Carbohydrates affect your blood glucose level more than any other type of food. Your dietitian will help you determine how many carbohydrates to eat at each meal and teach you how to count carbohydrates. Counting carbohydrates is important to keep your blood glucose at a healthy level, especially if you are using insulin or taking certain medicines for diabetes mellitus. Alcohol Alcohol can cause sudden decreases in blood glucose (hypoglycemia), especially if you use insulin or take certain medicines for diabetes mellitus. Hypoglycemia can be a life-threatening condition. Symptoms of hypoglycemia (sleepiness, dizziness, and disorientation) are similar to symptoms of having too much alcohol. If your health care provider has given you approval to drink alcohol, do so in moderation and use the following guidelines:  Women should not have more than one drink per day, and men  should not have more than two drinks per day. One drink is equal to: ? 12 oz of beer. ? 5 oz of wine. ? 1 oz of hard liquor.  Do not drink on an empty stomach.  Keep yourself hydrated. Have water, diet soda, or unsweetened iced tea.  Regular soda, juice, and other mixers might contain a lot of carbohydrates and should be counted.  What foods are not recommended? As you make food choices, it is important to remember that all foods are not the same. Some foods have fewer nutrients per serving than other foods, even though they might have the same number of calories or carbohydrates. It is difficult to get your body what it needs when you eat foods with fewer nutrients. Examples of foods that you should avoid that are high in calories and carbohydrates but low in nutrients include:  Trans fats (most processed foods list trans fats on the Nutrition Facts label).  Regular soda.  Juice.  Candy.  Sweets, such as cake, pie, doughnuts, and cookies.  Fried foods.  What foods can I eat? Eat nutrient-rich foods, which will nourish your body and keep you healthy. The food you should eat also will depend on several factors, including:  The calories you need.  The medicines you take.  Your weight.  Your blood glucose level.  Your blood pressure level.  Your cholesterol level.  You should eat a variety of foods, including:  Protein. ? Lean cuts of meat. ? Proteins low in saturated fats, such as fish, egg whites, and beans. Avoid processed meats.  Fruits and vegetables. ? Fruits and vegetables that may help control blood glucose levels, such as apples,   mangoes, and yams.  Dairy products. ? Choose fat-free or low-fat dairy products, such as milk, yogurt, and cheese.  Grains, bread, pasta, and rice. ? Choose whole grain products, such as multigrain bread, whole oats, and brown rice. These foods may help control blood pressure.  Fats. ? Foods containing healthful fats, such as  nuts, avocado, olive oil, canola oil, and fish.  Does everyone with diabetes mellitus have the same meal plan? Because every person with diabetes mellitus is different, there is not one meal plan that works for everyone. It is very important that you meet with a dietitian who will help you create a meal plan that is just right for you. This information is not intended to replace advice given to you by your health care provider. Make sure you discuss any questions you have with your health care provider. Document Released: 08/19/2005 Document Revised: 04/29/2016 Document Reviewed: 10/19/2013 Elsevier Interactive Patient Education  2017 Elsevier Inc.  

## 2017-07-22 NOTE — Progress Notes (Signed)
   Subjective:    Patient ID: Luke Solis, male    DOB: [birth year], 83 y.o.   MRN: [id number]  HPIDiabetes. A1C done on bloodwork on 07/20/17. Result 7.9. Pt states blood sugar has been running high. Pt brought in blood sugar readings.  Other than what she can do patient relates sugar readings are running a little bit on the high side often than 160-180 range sometimes 1:30 He is having a lot of burning in his feet. Denies swelling in the feet. Does state that he gets a little bit unsteady when he stands up this been going on past several months worse when it's dark. The burning in his feet is been going on for years he has ongoing troubles for years takes Lyrica for this. Diabetes been going on for years slightly worse has been consuming a fair amount of starches and sugars. Denies any chest tightness pressure pain shortness of breath.  Patient denies any chest tightness pressure pain shortness breath He is taking his cholesterol medicine previous labs reviewed importance of watching diet discussed Patient does take his low-dose blood pressure medicine keep his blood pressure under good control denies excessive salt use. Has had cholesterol and blood pressure for years Needs rx for nitroglycerin. His is out of date. Has not had to use.    Review of Systems  Constitutional: Negative for activity change, appetite change and fatigue.  HENT: Negative for congestion.   Respiratory: Negative for cough.   Cardiovascular: Negative for chest pain.  Gastrointestinal: Negative for abdominal pain.  Endocrine: Negative for polydipsia and polyphagia.  Musculoskeletal: Positive for arthralgias.  Neurological: Negative for weakness.  Psychiatric/Behavioral: Negative for confusion.  25 minutes was spent with the patient. Greater than half the time was spent in discussion and answering questions and counseling regarding the issues that the patient came in for today.     Objective:   Physical Exam    Constitutional: He appears well-nourished. No distress.  Cardiovascular: Normal rate, regular rhythm and normal heart sounds.   No murmur heard. Pulmonary/Chest: Effort normal and breath sounds normal. No respiratory distress.  Musculoskeletal: He exhibits no edema.  Lymphadenopathy:    He has no cervical adenopathy.  Neurological: He is alert.  Psychiatric: His behavior is normal.  Vitals reviewed.  No edema and no foot ulcers.       Assessment & Plan:  Type 2 diabetes with diabetic neuropathy increase nortriptyline continue Lyrica as is watch diet better recheck A1c later this year if not doing better we will add medicine  Hyperlipidemia previous labs reviewed new labs be ordered later this year continue medication watch diet  Sleep apnea and not using it at all I encouraged him to get back with sleep apnea specialists he should use it at least 4 hours every night  Blood pressure good control continue current measures  Follow-up in a few months

## 2017-08-03 ENCOUNTER — Ambulatory Visit (INDEPENDENT_AMBULATORY_CARE_PROVIDER_SITE_OTHER): Payer: Medicare Other | Admitting: Pulmonary Disease

## 2017-08-03 ENCOUNTER — Encounter: Payer: Self-pay | Admitting: Pulmonary Disease

## 2017-08-03 VITALS — BP 138/86 | HR 66 | Ht 74.0 in | Wt 166.0 lb

## 2017-08-03 DIAGNOSIS — G4733 Obstructive sleep apnea (adult) (pediatric): Secondary | ICD-10-CM | POA: Diagnosis not present

## 2017-08-03 DIAGNOSIS — R5383 Other fatigue: Secondary | ICD-10-CM | POA: Insufficient documentation

## 2017-08-03 NOTE — Patient Instructions (Signed)
   We are going to start you back on your CPAP.  Call me if you have any questions or problems with your machine or supplies.  I will see you back in 6 weeks to see if it has helped your fatigue/sleepiness.

## 2017-08-03 NOTE — Progress Notes (Signed)
Subjective:    Patient ID: Luke Solis, male    DOB: 1931/01/31, 83 y.o.   MRN: [id number]  C.C.:  Follow-up for OSA on CPAP.  HPI Patient last seen in August 2015 in our office. No diagnosis of sleep apnea. Previously on CPAP therapy with some difficulty obtaining a properly fitting mask. Previous CPAP pressure at 10 cm H2O. Diagnosed in 2006 with AHI 20 events/hour per my review of the electronic medical record. He reports he hasn't used his CPAP since 2016. He reports good quality of sleep and sleeps about 7 hours a night. Gets up 2-3 times a night at most to urinate. Wife denies any witnessed apneas. Denies any morning headaches. Takes frequent naps daily. He does doze off easily. His machine is from 2015.   Review of Systems No chest pain or pressure. No dyspnea, coughing or wheezing. No abdominal pain, nausea, or emesis.   No Known Allergies  Current Outpatient Prescriptions on File Prior to Visit  Medication Sig Dispense Refill  . aspirin 81 MG tablet Take 81 mg by mouth daily.    Marland Kitchen atorvastatin (LIPITOR) 80 MG tablet TAKE (1) TABLET BY MOUTH ONCE DAILY. 30 tablet 3  . Calcium Carbonate-Vitamin D 600-400 MG-UNIT per tablet Take 1 tablet by mouth daily.    . fexofenadine (ALLEGRA) 180 MG tablet Take 180 mg by mouth as needed.    Marland Kitchen FORACARE PREMIUM V10 TEST test strip USE AS DIRECTED. 50 each 0  . hydrochlorothiazide (HYDRODIURIL) 25 MG tablet TAKE (1/2) TABLET BY MOUTH DAILY. 15 tablet 5  . losartan (COZAAR) 25 MG tablet TAKE 1 TABLET ONCE DAILY. 30 tablet 0  . LYRICA 100 MG capsule TAKE 1 CAPSULE IN THE MORNING AND 2 CAPSULES AT BEDTIME. 50 capsule 5  . metFORMIN (GLUCOPHAGE) 500 MG tablet TAKE (1) TABLET TWICE DAILY. 60 tablet 0  . Multiple Vitamin (MULTIVITAMIN) tablet Take 1 tablet by mouth daily.    . nitroGLYCERIN (NITROSTAT) 0.4 MG SL tablet Place 1 tablet (0.4 mg total) under the tongue every 5 (five) minutes as needed for chest pain. 25 tablet 4  . nortriptyline (PAMELOR)  10 MG capsule 3 daily at bedtime 90 capsule 12   No current facility-administered medications on file prior to visit.     Past Medical History:  Diagnosis Date  . Coronary atherosclerosis of native coronary artery    BMS proximal and mid RCA as well as BMS circumflex - 2002 (had residual 70% distal LAD), LVEF 60%  . Essential hypertension, benign   . Hyperlipidemia   . Neuropathy   . NSTEMI (non-ST elevated myocardial infarction) ([place])    2002  . Prostate cancer ([place])   . Sleep apnea    On CPAP    Past Surgical History:  Procedure Laterality Date  . COLONOSCOPY    . INGUINAL HERNIA REPAIR    . KNEE SURGERY    . NOSE SURGERY    . PROSTATE SURGERY    . TONSILLECTOMY      Family History  Problem Relation Age of Onset  . Heart attack Mother   . Diabetes Mother   . Coronary artery disease Father   . Hypertension Father   . Heart attack Father   . Lung disease Neg Hx   . Cancer Neg Hx     Social History   Social History  . Marital status: Married    Spouse name: N/A  . Number of children: N/A  . Years of education: N/A  Social History Main Topics  . Smoking status: Never Smoker  . Smokeless tobacco: Never Used  . Alcohol use No  . Drug use: No  . Sexual activity: Not Asked   Other Topics Concern  . None   Social History Narrative   [facility] (08/03/17):   Originally from [facility]. Has always lived in [place]. 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 No pets currently. No bird or mold exposure.       Objective:   Physical Exam BP 138/86 (BP Location: Right Arm, Cuff Size: Normal)   Pulse 66   Ht 6\' 2"  (1.88 m)   Wt 166 lb (75.3 kg)   SpO2 98%   BMI 21.31 kg/m  General:  Awake. Alert. Accompanied by wife today. No distress. Integument:  Warm & dry. No rash on exposed skin. No bruising on exposed skin. Extremities:  No cyanosis or clubbing.  HEENT:  Moist mucus membranes. No nasal turbinate swelling. No oral ulcers. Cardiovascular:   Regular rate. No edema. Unable to appreciate JVD.  Pulmonary:  Good aeration & clear to auscultation bilaterally. Symmetric chest wall expansion. No accessory muscle use. Abdomen: Soft. Normal bowel sounds. Nondistended.  Musculoskeletal:  Normal bulk and tone. No joint deformity or effusion appreciated.  Epworth Sleepiness Scale (08/03/17):  17 (abnormal)    Assessment & Plan:  83 y.o. male previously diagnosed with sleep apnea. Patient is experiencing excessive daytime fatigue and sleepiness which could be secondary to his underlying untreated sleep apnea or possibly due to his medication regimen. I am restarting the patient on CPAP for treatment of his sleep apnea and we will reevaluate in 6 weeks to see if this is had some improvement. I instructed the patient contact me if he had any questions or concerns before then.  1. OSA:  Restarting patient on CPAP. 2. Fatigue: Likely multifactorial. Plan to reevaluate symptoms at follow-up. 3. Follow-up: Return to clinic in  6 weeks with a download.  Sonia Baller [name], M.D. [facility] Pulmonary & Critical Care Pager:  [phone] After 3pm or if no response, call [phone] 11:02 AM 08/03/17

## 2017-08-09 ENCOUNTER — Ambulatory Visit: Payer: Medicare Other | Admitting: Family Medicine

## 2017-08-11 DIAGNOSIS — H2511 Age-related nuclear cataract, right eye: Secondary | ICD-10-CM | POA: Diagnosis not present

## 2017-08-22 ENCOUNTER — Encounter: Payer: Self-pay | Admitting: Family Medicine

## 2017-08-22 ENCOUNTER — Ambulatory Visit (INDEPENDENT_AMBULATORY_CARE_PROVIDER_SITE_OTHER): Payer: Medicare Other | Admitting: Family Medicine

## 2017-08-22 VITALS — BP 128/72 | Temp 97.7°F | Ht 74.0 in | Wt 164.0 lb

## 2017-08-22 DIAGNOSIS — L02611 Cutaneous abscess of right foot: Secondary | ICD-10-CM | POA: Diagnosis not present

## 2017-08-22 DIAGNOSIS — L03031 Cellulitis of right toe: Secondary | ICD-10-CM

## 2017-08-22 MED ORDER — CEPHALEXIN 500 MG PO CAPS: 500.0000 mg | ORAL_CAPSULE | Freq: Four times a day (QID) | ORAL | 0 refills | Status: DC

## 2017-08-22 NOTE — Progress Notes (Signed)
   Subjective:    Patient ID: Luke Solis, male    DOB: September 01, 1931, 83 y.o.   MRN: [id number]  Toe Pain    This patient has diabetes. This patient relates soreness in his toenails since they were trimmed he relates reddened area along with peeling in the skin. He denies fever chills it is sore to touch it is sore to walk on. He's never had this problem before it's been going on for several weeks and seems to be getting worse over the past several days. Patient states he had his toenails trimmed three weeks ago and Great toe on right foot has has been sore since.Has used neosporin. Review of Systems  Constitutional: Negative for activity change, fatigue and fever.  Respiratory: Negative for cough and shortness of breath.   Cardiovascular: Negative for chest pain and leg swelling.  Neurological: Negative for headaches.       Objective:   Physical Exam  Constitutional: He appears well-nourished.  Cardiovascular: Normal rate, regular rhythm and normal heart sounds.   No murmur heard. Pulmonary/Chest: Effort normal and breath sounds normal.  Musculoskeletal: He exhibits no edema.  Lymphadenopathy:    He has no cervical adenopathy.  Neurological: He is alert.  Psychiatric: His behavior is normal.  Vitals reviewed.    On physical exam there is cellulitis of the great toe no abscess. Careful inspection of the great nail was completed. Medial aspect had significant thickening of the skin consistent with some callus formation. The medial aspect of the skin was carefully pushed back to alleviate any pressure on it to allow for good healing dead callus formation was carefully removed     Assessment & Plan:  Cellulitis Antibiotics prescribed No abscess If ongoing troubles referral back to podiatry for medial nail resection.

## 2017-08-30 ENCOUNTER — Other Ambulatory Visit: Payer: Self-pay

## 2017-08-30 MED ORDER — LOSARTAN POTASSIUM 25 MG PO TABS: 25.0000 mg | ORAL_TABLET | Freq: Every day | ORAL | 5 refills | Status: DC

## 2017-08-30 MED ORDER — METFORMIN HCL 500 MG PO TABS: ORAL_TABLET | ORAL | 5 refills | Status: DC

## 2017-08-30 NOTE — Progress Notes (Signed)
Prescriptions sent electronically to pharmacy 

## 2017-08-30 NOTE — Progress Notes (Signed)
May refill each medicine 4

## 2017-09-14 ENCOUNTER — Encounter: Payer: Self-pay | Admitting: Pulmonary Disease

## 2017-09-15 ENCOUNTER — Ambulatory Visit (INDEPENDENT_AMBULATORY_CARE_PROVIDER_SITE_OTHER): Payer: Medicare Other | Admitting: Pulmonary Disease

## 2017-09-15 ENCOUNTER — Encounter: Payer: Self-pay | Admitting: Pulmonary Disease

## 2017-09-15 ENCOUNTER — Encounter: Payer: Medicare Other | Admitting: Family Medicine

## 2017-09-15 VITALS — BP 136/76 | HR 83 | Ht 74.0 in | Wt 166.1 lb

## 2017-09-15 DIAGNOSIS — G4733 Obstructive sleep apnea (adult) (pediatric): Secondary | ICD-10-CM | POA: Diagnosis not present

## 2017-09-15 DIAGNOSIS — Z9989 Dependence on other enabling machines and devices: Secondary | ICD-10-CM | POA: Diagnosis not present

## 2017-09-15 DIAGNOSIS — Z23 Encounter for immunization: Secondary | ICD-10-CM | POA: Diagnosis not present

## 2017-09-15 NOTE — Patient Instructions (Signed)
   Keep using your CPAP.  Let me know if your mask continues to have significant leaks.  Call my office if you have any problems with your machine or mask.

## 2017-09-15 NOTE — Progress Notes (Signed)
Subjective:    Patient ID: Luke Solis, male    DOB: 1930-12-09, 83 y.o.   MRN: [id number]  C.C.:  Follow-up for OSA on CPAP.  HPI OSA: Restarted on CPAP at last appointment. Previously on CPAP therapy with some difficulty obtaining a properly fitting mask. Previous CPAP pressure at 10 cm H2O. Diagnosed in 2006 with AHI 20 events/hour per my previous review of the electronic medical record. He reports he has continued to use his CPAP with decreased fatigue and improved sleep quality since his last appointment. He reports he is having some problems with his mask.   Review of Systems No chest pain or pressure. No fever or chills. No abdominal pain or nausea.   No Known Allergies  Current Outpatient Prescriptions on File Prior to Visit  Medication Sig Dispense Refill  . aspirin 81 MG tablet Take 81 mg by mouth daily.    Marland Kitchen atorvastatin (LIPITOR) 80 MG tablet TAKE (1) TABLET BY MOUTH ONCE DAILY. 30 tablet 3  . Calcium Carbonate-Vitamin D 600-400 MG-UNIT per tablet Take 1 tablet by mouth daily.    . fexofenadine (ALLEGRA) 180 MG tablet Take 180 mg by mouth as needed.    Marland Kitchen FORACARE PREMIUM V10 TEST test strip USE AS DIRECTED. 50 each 0  . hydrochlorothiazide (HYDRODIURIL) 25 MG tablet TAKE (1/2) TABLET BY MOUTH DAILY. 15 tablet 5  . losartan (COZAAR) 25 MG tablet Take 1 tablet (25 mg total) by mouth daily. 30 tablet 5  . LYRICA 100 MG capsule TAKE 1 CAPSULE IN THE MORNING AND 2 CAPSULES AT BEDTIME. 50 capsule 5  . metFORMIN (GLUCOPHAGE) 500 MG tablet TAKE (1) TABLET TWICE DAILY. 60 tablet 5  . Multiple Vitamin (MULTIVITAMIN) tablet Take 1 tablet by mouth daily.    . nitroGLYCERIN (NITROSTAT) 0.4 MG SL tablet Place 1 tablet (0.4 mg total) under the tongue every 5 (five) minutes as needed for chest pain. 25 tablet 4  . nortriptyline (PAMELOR) 10 MG capsule 3 daily at bedtime 90 capsule 12   No current facility-administered medications on file prior to visit.     Past Medical History:    Diagnosis Date  . Coronary atherosclerosis of native coronary artery    BMS proximal and mid RCA as well as BMS circumflex - 2002 (had residual 70% distal LAD), LVEF 60%  . Essential hypertension, benign   . Hyperlipidemia   . Neuropathy   . NSTEMI (non-ST elevated myocardial infarction) ([place])    2002  . Prostate cancer ([place])   . Sleep apnea    On CPAP    Past Surgical History:  Procedure Laterality Date  . COLONOSCOPY    . INGUINAL HERNIA REPAIR    . KNEE SURGERY    . NOSE SURGERY    . PROSTATE SURGERY    . TONSILLECTOMY      Family History  Problem Relation Age of Onset  . Heart attack Mother   . Diabetes Mother   . Coronary artery disease Father   . Hypertension Father   . Heart attack Father   . Lung disease Neg Hx   . Cancer Neg Hx     Social History   Social History  . Marital status: Married    Spouse name: N/A  . Number of children: N/A  . Years of education: N/A   Social History Main Topics  . Smoking status: Never Smoker  . Smokeless tobacco: Never Used  . Alcohol use No  . Drug use: No  .  Sexual activity: Not Asked   Other Topics Concern  . None   Social History Narrative   [facility] (08/03/17):   Originally from [facility]. Has always lived in [place]. 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 No pets currently. No bird or mold exposure.       Objective:   Physical Exam BP 136/76 (BP Location: Left Arm, Cuff Size: Normal)   Pulse 83   Ht 6\' 2"  (1.88 m)   Wt 166 lb 2 oz (75.4 kg)   SpO2 97%   BMI 21.33 kg/m   General:  Awake. Elderly male. No distress. Integument:  Warm. Dry. No rash. Extremities:  No cyanosis or clubbing.  HEENT: No scleral icterus. No scleral injection. Moist mucous membranes Cardiovascular:  Regular rate. No edema. Unable to appreciate JVD.  Pulmonary:  Clear with auscultation bilaterally. Normal work of breathing on room air. Abdomen: Soft. Normal bowel sounds. Nondistended.  Musculoskeletal:   Normal bulk and tone. No joint deformity or effusion appreciated. Neurological:  Cranial nerves 2-12 grossly in tact. No meningismus. Moving all 4 extremities equally.   CPAP COMPLIANCE DOWNLOAD 08/16/17 - 09/14/17:  Started using machine on 9/24. 53% usage. Average usage on days used 6 hours 14 minutes. AutoSet with minimum pressure 8 cm H2O & maximum pressure of 15 cm H2O. Maximum leak 158.3 L/m. Median pressure 11.6 cm H2O & maximum pressure 14.6 cm H2O. Residual AHI 18.8 events/hour. 07/23/14 - 08/21/14: 100% usage with average usage 7 hours 6 minutes. CPAP pressure 10 cm H2O. Residual AHI 9.5 events/hour.  Epworth Sleepiness Scale (08/03/17):  17 (abnormal)    Assessment & Plan:  83 y.o. male with sleep apnea. Patient's excessive fatigue and degree of somnolence have significant improved by his report after reinitiation of CPAP therapy. Reviewing his download does show significant air leak which is likely contributing to his residual AHI. We reviewed his mask and headgear apparatus today adjusting his straps. I advised the patient to contact us or his DME company if leak persists or if he had any issues with his device. I instructed the patient to contact our office if he had questions or concerns before his next appointment.  1. OSA:  Patient advised to continue using CPAP & adjust mask for best fit to minimize leak. Plan to perform download at next appointment. 2. Health maintenance: Administering high-dose influenza vaccine today. Status post Prevnar September 2015 & Pneumovax January 2013. 3. Follow-up: Return to clinic in 3 months or sooner if needed.  Sonia Baller [name], M.D. [facility] Pulmonary & Critical Care Pager:  [phone] After 3pm or if no response, call [phone] 10:31 AM 09/15/17

## 2017-09-21 ENCOUNTER — Encounter: Payer: Self-pay | Admitting: Family Medicine

## 2017-09-21 ENCOUNTER — Ambulatory Visit (INDEPENDENT_AMBULATORY_CARE_PROVIDER_SITE_OTHER): Payer: Medicare Other | Admitting: Family Medicine

## 2017-09-21 VITALS — BP 120/70 | Ht 74.0 in | Wt 168.0 lb

## 2017-09-21 DIAGNOSIS — E785 Hyperlipidemia, unspecified: Secondary | ICD-10-CM

## 2017-09-21 DIAGNOSIS — I10 Essential (primary) hypertension: Secondary | ICD-10-CM

## 2017-09-21 DIAGNOSIS — E1140 Type 2 diabetes mellitus with diabetic neuropathy, unspecified: Secondary | ICD-10-CM | POA: Diagnosis not present

## 2017-09-21 NOTE — Patient Instructions (Signed)
Diabetes Mellitus and Food It is important for you to manage your blood sugar (glucose) level. Your blood glucose level can be greatly affected by what you eat. Eating healthier foods in the appropriate amounts throughout the day at about the same time each day will help you control your blood glucose level. It can also help slow or prevent worsening of your diabetes mellitus. Healthy eating may even help you improve the level of your blood pressure and reach or maintain a healthy weight. General recommendations for healthful eating and cooking habits include:  Eating meals and snacks regularly. Avoid going long periods of time without eating to lose weight.  Eating a diet that consists mainly of plant-based foods, such as fruits, vegetables, nuts, legumes, and whole grains.  Using low-heat cooking methods, such as baking, instead of high-heat cooking methods, such as deep frying.  Work with your dietitian to make sure you understand how to use the Nutrition Facts information on food labels. How can food affect me? Carbohydrates Carbohydrates affect your blood glucose level more than any other type of food. Your dietitian will help you determine how many carbohydrates to eat at each meal and teach you how to count carbohydrates. Counting carbohydrates is important to keep your blood glucose at a healthy level, especially if you are using insulin or taking certain medicines for diabetes mellitus. Alcohol Alcohol can cause sudden decreases in blood glucose (hypoglycemia), especially if you use insulin or take certain medicines for diabetes mellitus. Hypoglycemia can be a life-threatening condition. Symptoms of hypoglycemia (sleepiness, dizziness, and disorientation) are similar to symptoms of having too much alcohol. If your health care provider has given you approval to drink alcohol, do so in moderation and use the following guidelines:  Women should not have more than one drink per day, and men  should not have more than two drinks per day. One drink is equal to: ? 12 oz of beer. ? 5 oz of wine. ? 1 oz of hard liquor.  Do not drink on an empty stomach.  Keep yourself hydrated. Have water, diet soda, or unsweetened iced tea.  Regular soda, juice, and other mixers might contain a lot of carbohydrates and should be counted.  What foods are not recommended? As you make food choices, it is important to remember that all foods are not the same. Some foods have fewer nutrients per serving than other foods, even though they might have the same number of calories or carbohydrates. It is difficult to get your body what it needs when you eat foods with fewer nutrients. Examples of foods that you should avoid that are high in calories and carbohydrates but low in nutrients include:  Trans fats (most processed foods list trans fats on the Nutrition Facts label).  Regular soda.  Juice.  Candy.  Sweets, such as cake, pie, doughnuts, and cookies.  Fried foods.  What foods can I eat? Eat nutrient-rich foods, which will nourish your body and keep you healthy. The food you should eat also will depend on several factors, including:  The calories you need.  The medicines you take.  Your weight.  Your blood glucose level.  Your blood pressure level.  Your cholesterol level.  You should eat a variety of foods, including:  Protein. ? Lean cuts of meat. ? Proteins low in saturated fats, such as fish, egg whites, and beans. Avoid processed meats.  Fruits and vegetables. ? Fruits and vegetables that may help control blood glucose levels, such as apples,   mangoes, and yams.  Dairy products. ? Choose fat-free or low-fat dairy products, such as milk, yogurt, and cheese.  Grains, bread, pasta, and rice. ? Choose whole grain products, such as multigrain bread, whole oats, and brown rice. These foods may help control blood pressure.  Fats. ? Foods containing healthful fats, such as  nuts, avocado, olive oil, canola oil, and fish.  Does everyone with diabetes mellitus have the same meal plan? Because every person with diabetes mellitus is different, there is not one meal plan that works for everyone. It is very important that you meet with a dietitian who will help you create a meal plan that is just right for you. This information is not intended to replace advice given to you by your health care provider. Make sure you discuss any questions you have with your health care provider. Document Released: 08/19/2005 Document Revised: 04/29/2016 Document Reviewed: 10/19/2013 Elsevier Interactive Patient Education  2017 Elsevier Inc.  

## 2017-09-21 NOTE — Progress Notes (Signed)
   Subjective:    Patient ID: GAYLORD SEYDEL, male    DOB: 05-Sep-1931, 83 y.o.   MRN: [id number]  Diabetes  He presents for his follow-up diabetic visit. He has type 2 diabetes mellitus.   Last A1c was 07/20/2017 at 7.9. He is currently on Glucophage 500 mg one BID.Says he eats healthy and does not get enough exercise due to the neuropathy. He sees podiatrist and is due to see him in Nov, he also sees eye Dr and is due to have a cataract removed from left eye next month. Here today to discuss his glucoses 15 minutes spent with patient greater than half in discussion  Review of Systems Denies chest tightness pressure pain shortness breath denies nausea vomiting diarrhea fever chills    Objective:   Physical Exam  Lungs clear heart regular pulse normal BP good Blood pressure checked sitting standing no orthostasis     Assessment & Plan:  Glucose readings are somewhat elevated in the 1:30-150 range in the morning anywhere between 150 and later in the day  He is not doing a good job watching his diet he talked about minimizing starches  Recheck A1c lab work in about 6 weeks with follow-up office visit may need to be on additional medicine  Neuropathy stable

## 2017-09-23 ENCOUNTER — Other Ambulatory Visit: Payer: Self-pay | Admitting: *Deleted

## 2017-09-23 MED ORDER — PREGABALIN 100 MG PO CAPS: ORAL_CAPSULE | ORAL | 5 refills | Status: DC

## 2017-09-23 NOTE — Telephone Encounter (Signed)
Ok 90 six mo worth

## 2017-10-04 ENCOUNTER — Other Ambulatory Visit: Payer: Self-pay | Admitting: Family Medicine

## 2017-10-12 DIAGNOSIS — M79671 Pain in right foot: Secondary | ICD-10-CM | POA: Diagnosis not present

## 2017-10-12 DIAGNOSIS — M79672 Pain in left foot: Secondary | ICD-10-CM | POA: Diagnosis not present

## 2017-10-12 DIAGNOSIS — L609 Nail disorder, unspecified: Secondary | ICD-10-CM | POA: Diagnosis not present

## 2017-10-12 DIAGNOSIS — E1140 Type 2 diabetes mellitus with diabetic neuropathy, unspecified: Secondary | ICD-10-CM | POA: Diagnosis not present

## 2017-10-18 DIAGNOSIS — H2512 Age-related nuclear cataract, left eye: Secondary | ICD-10-CM | POA: Diagnosis not present

## 2017-10-20 DIAGNOSIS — H2512 Age-related nuclear cataract, left eye: Secondary | ICD-10-CM | POA: Diagnosis not present

## 2017-11-10 DIAGNOSIS — E1140 Type 2 diabetes mellitus with diabetic neuropathy, unspecified: Secondary | ICD-10-CM | POA: Diagnosis not present

## 2017-11-10 DIAGNOSIS — E785 Hyperlipidemia, unspecified: Secondary | ICD-10-CM | POA: Diagnosis not present

## 2017-11-10 DIAGNOSIS — I10 Essential (primary) hypertension: Secondary | ICD-10-CM | POA: Diagnosis not present

## 2017-11-11 LAB — BASIC METABOLIC PANEL
BUN/Creatinine Ratio: 19 (ref 10–24)
BUN: 20 mg/dL (ref 8–27)
CALCIUM: 9.6 mg/dL (ref 8.6–10.2)
CHLORIDE: 104 mmol/L (ref 96–106)
CO2: 26 mmol/L (ref 20–29)
Creatinine, Ser: 1.08 mg/dL (ref 0.76–1.27)
GFR, EST AFRICAN AMERICAN: 72 mL/min/{1.73_m2} (ref >59)
GFR, EST NON AFRICAN AMERICAN: 63 mL/min/{1.73_m2} (ref >59)
Glucose: 141 mg/dL — ABNORMAL HIGH (ref 65–99)
POTASSIUM: 4 mmol/L (ref 3.5–5.2)
SODIUM: 146 mmol/L — AB (ref 134–144)

## 2017-11-11 LAB — LIPID PANEL
CHOL/HDL RATIO: 2.7 ratio (ref 0.0–5.0)
Cholesterol, Total: 136 mg/dL (ref 100–199)
HDL: 51 mg/dL (ref >39)
LDL Calculated: 71 mg/dL (ref 0–99)
Triglycerides: 71 mg/dL (ref 0–149)
VLDL Cholesterol Cal: 14 mg/dL (ref 5–40)

## 2017-11-11 LAB — HEMOGLOBIN A1C
Est. average glucose Bld gHb Est-mCnc: 171 mg/dL
HEMOGLOBIN A1C: 7.6 % — AB (ref 4.8–5.6)

## 2017-11-15 ENCOUNTER — Other Ambulatory Visit: Payer: Self-pay | Admitting: Family Medicine

## 2017-11-16 ENCOUNTER — Ambulatory Visit: Payer: Medicare Other | Admitting: Family Medicine

## 2017-11-22 ENCOUNTER — Ambulatory Visit (INDEPENDENT_AMBULATORY_CARE_PROVIDER_SITE_OTHER): Payer: Medicare Other | Admitting: Family Medicine

## 2017-11-22 ENCOUNTER — Encounter: Payer: Self-pay | Admitting: Family Medicine

## 2017-11-22 VITALS — BP 138/80 | Temp 98.0°F | Ht 74.0 in | Wt 166.0 lb

## 2017-11-22 DIAGNOSIS — R5383 Other fatigue: Secondary | ICD-10-CM

## 2017-11-22 DIAGNOSIS — E785 Hyperlipidemia, unspecified: Secondary | ICD-10-CM | POA: Diagnosis not present

## 2017-11-22 DIAGNOSIS — I10 Essential (primary) hypertension: Secondary | ICD-10-CM

## 2017-11-22 DIAGNOSIS — E1140 Type 2 diabetes mellitus with diabetic neuropathy, unspecified: Secondary | ICD-10-CM | POA: Diagnosis not present

## 2017-11-22 LAB — POCT HEMOGLOBIN: HEMOGLOBIN: 13.8 g/dL — AB (ref 14.1–18.1)

## 2017-11-22 MED ORDER — KETOCONAZOLE 2 % EX CREA: 1.0000 "application " | TOPICAL_CREAM | Freq: Two times a day (BID) | CUTANEOUS | 4 refills | Status: DC

## 2017-11-22 MED ORDER — FLUCONAZOLE 150 MG PO TABS: ORAL_TABLET | ORAL | 2 refills | Status: DC
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
## 2017-11-25 ENCOUNTER — Encounter: Payer: Self-pay | Admitting: Family Medicine

## 2017-11-28 ENCOUNTER — Other Ambulatory Visit: Payer: Self-pay | Admitting: Family Medicine

## 2017-11-30 ENCOUNTER — Other Ambulatory Visit: Payer: Self-pay | Admitting: Family Medicine

## 2017-12-02 DIAGNOSIS — N35014 Post-traumatic urethral stricture, male, unspecified: Secondary | ICD-10-CM | POA: Diagnosis not present

## 2017-12-02 DIAGNOSIS — C61 Malignant neoplasm of prostate: Secondary | ICD-10-CM | POA: Diagnosis not present

## 2017-12-02 DIAGNOSIS — R972 Elevated prostate specific antigen [PSA]: Secondary | ICD-10-CM | POA: Diagnosis not present

## 2017-12-02 DIAGNOSIS — R9721 Rising PSA following treatment for malignant neoplasm of prostate: Secondary | ICD-10-CM | POA: Diagnosis not present

## 2017-12-02 DIAGNOSIS — N3946 Mixed incontinence: Secondary | ICD-10-CM | POA: Diagnosis not present

## 2017-12-02 DIAGNOSIS — N3040 Irradiation cystitis without hematuria: Secondary | ICD-10-CM | POA: Diagnosis not present

## 2017-12-11 DIAGNOSIS — R079 Chest pain, unspecified: Secondary | ICD-10-CM | POA: Diagnosis not present

## 2017-12-11 DIAGNOSIS — I2510 Atherosclerotic heart disease of native coronary artery without angina pectoris: Secondary | ICD-10-CM | POA: Diagnosis not present

## 2017-12-11 DIAGNOSIS — R1110 Vomiting, unspecified: Secondary | ICD-10-CM | POA: Diagnosis not present

## 2017-12-11 DIAGNOSIS — Z79899 Other long term (current) drug therapy: Secondary | ICD-10-CM | POA: Diagnosis not present

## 2017-12-11 DIAGNOSIS — R109 Unspecified abdominal pain: Secondary | ICD-10-CM | POA: Diagnosis not present

## 2017-12-11 DIAGNOSIS — E119 Type 2 diabetes mellitus without complications: Secondary | ICD-10-CM | POA: Diagnosis not present

## 2017-12-11 DIAGNOSIS — Z8546 Personal history of malignant neoplasm of prostate: Secondary | ICD-10-CM | POA: Diagnosis not present

## 2017-12-11 DIAGNOSIS — R112 Nausea with vomiting, unspecified: Secondary | ICD-10-CM | POA: Diagnosis not present

## 2017-12-16 ENCOUNTER — Ambulatory Visit: Payer: Medicare Other | Admitting: Family Medicine

## 2017-12-16 ENCOUNTER — Encounter: Payer: Self-pay | Admitting: Family Medicine

## 2017-12-16 ENCOUNTER — Ambulatory Visit (INDEPENDENT_AMBULATORY_CARE_PROVIDER_SITE_OTHER): Payer: Medicare Other | Admitting: Family Medicine

## 2017-12-16 VITALS — BP 120/68 | Temp 97.5°F | Ht 74.0 in | Wt 161.0 lb

## 2017-12-16 DIAGNOSIS — B349 Viral infection, unspecified: Secondary | ICD-10-CM | POA: Diagnosis not present

## 2017-12-16 DIAGNOSIS — I951 Orthostatic hypotension: Secondary | ICD-10-CM

## 2017-12-16 NOTE — Progress Notes (Signed)
   Subjective:    Patient ID: Luke Solis, male    DOB: 1931/06/19, 84 y.o.   MRN: [id number]  HPI Patient is here today to follow up on vomiting continuously for two days started Sunday. Went to the Ed. He is still weak,but not like he had been. This patient has been dealing with exhaustion over the past several weeks because he has been under a lot of stress his son is [place] with critical condition he has been back and forth to see his son and spend time they are living in a hotel room which is not a good condition for this patient he has a history of heart disease diabetes. He had extensive vomiting over the weekend he went to the ER they gave him IV fluids they did tests on him and they allowed him to go home he traveled back yesterday Review of Systems  Constitutional: Positive for fatigue. Negative for activity change and fever.  Respiratory: Negative for cough and shortness of breath.   Cardiovascular: Negative for chest pain and leg swelling.  Neurological: Positive for dizziness. Negative for headaches.       Objective:   Physical Exam  Constitutional: He appears well-nourished. No distress.  Cardiovascular: Normal rate, regular rhythm and normal heart sounds.  No murmur heard. Pulmonary/Chest: Effort normal and breath sounds normal. No respiratory distress.  Musculoskeletal: He exhibits no edema.  Lymphadenopathy:    He has no cervical adenopathy.  Neurological: He is alert.  Psychiatric: His behavior is normal.  Vitals reviewed.  Blood pressure 110/70 laying 100/60 sitting 88/56 standing  He does not appear toxic but he does appear tired     Assessment & Plan:  Recent gastroenteritis resolving.  Weight loss related to recent gastroenteritis important for the patient to eat better or drink better follow-up in a couple weeks to recheck weight  Orthostatic hypotension.  Blood pressure medicines for now recheck in 2 weeks time  Significant fatigue tiredness related to  emotional stress I do not recommend that the patient travels back to [place] for now  Records from ER in [place] was requested

## 2017-12-16 NOTE — Patient Instructions (Addendum)
Hold Losartan and HCTZ until next visit (in other words do not take)  Recheck in 2 weeks

## 2017-12-21 ENCOUNTER — Telehealth: Payer: Self-pay | Admitting: Family Medicine

## 2017-12-21 DIAGNOSIS — E876 Hypokalemia: Secondary | ICD-10-CM

## 2017-12-21 NOTE — Telephone Encounter (Signed)
Please let the patient be aware that I did in fact receive the notes from his ER visit in [place].  I would like for the patient to repeat a metabolic 7 along with a magnesium reason-his potassium was slightly low on that visit to the ER I want to make sure that it is back to normal.  The patient would be so kind as to do this test somewhere in the next 7 days

## 2017-12-22 NOTE — Addendum Note (Signed)
Addended by: Dairl Ponder on: 12/22/2017 03:46 PM   Modules accepted: Orders

## 2017-12-22 NOTE — Telephone Encounter (Signed)
Left message to return call (Blood work ordered in [facility])

## 2017-12-27 ENCOUNTER — Encounter: Payer: Self-pay | Admitting: Family Medicine

## 2017-12-27 ENCOUNTER — Ambulatory Visit (INDEPENDENT_AMBULATORY_CARE_PROVIDER_SITE_OTHER): Payer: Medicare Other | Admitting: Family Medicine

## 2017-12-27 VITALS — BP 110/70 | Ht 74.0 in | Wt 166.0 lb

## 2017-12-27 DIAGNOSIS — I951 Orthostatic hypotension: Secondary | ICD-10-CM

## 2017-12-27 DIAGNOSIS — E876 Hypokalemia: Secondary | ICD-10-CM | POA: Diagnosis not present

## 2017-12-27 NOTE — Telephone Encounter (Signed)
I did speak with the patient regarding this he was going next door to get his blood work drawn today

## 2017-12-27 NOTE — Telephone Encounter (Signed)
Did you advised patient of this at his appointment? (blood work was ordered in [facility])

## 2017-12-27 NOTE — Patient Instructions (Signed)
Stop HCTZ  Use Losartan 25 mg per day  Recheck in 4 months

## 2017-12-27 NOTE — Progress Notes (Signed)
   Subjective:    Patient ID: Luke Solis, male    DOB: 04/01/1931, 84 y.o.   MRN: [id number]  HPI  Patient arrives for a follow up on blood pressure.  Patient for blood pressure check up. Patient relates compliance with meds. Todays BP reviewed with the patient. Patient denies issues with medication. Patient relates reasonable diet. Patient tries to minimize salt. Patient aware of BP goals.  15 minutes was spent with patient today discussing healthcare issues which they came. Greater than half of the discussion was answering questions and giving management guidance.   Review of Systems  Constitutional: Negative for activity change, fatigue and fever.  HENT: Negative for congestion and rhinorrhea.   Respiratory: Negative for cough and shortness of breath.   Cardiovascular: Negative for chest pain and leg swelling.  Gastrointestinal: Negative for abdominal pain, diarrhea and nausea.  Genitourinary: Negative for dysuria and hematuria.  Neurological: Negative for weakness and headaches.  Psychiatric/Behavioral: Negative for behavioral problems.       Objective:   Physical Exam  Constitutional: He appears well-nourished. No distress.  HENT:  Head: Normocephalic and atraumatic.  Eyes: Right eye exhibits no discharge. Left eye exhibits no discharge.  Neck: No tracheal deviation present.  Cardiovascular: Normal rate, regular rhythm and normal heart sounds.  No murmur heard. Pulmonary/Chest: Effort normal and breath sounds normal. No respiratory distress. He has no wheezes.  Musculoskeletal: He exhibits no edema.  Lymphadenopathy:    He has no cervical adenopathy.  Neurological: He is alert.  Skin: Skin is warm. No rash noted.  Psychiatric: His behavior is normal.  Vitals reviewed.  Orthostatics checked without difficulty no significant changes noted       Assessment & Plan:  HTN- Patient was seen today as part of a visit regarding hypertension. The importance of healthy diet  and regular physical activity was discussed. The importance of compliance with medications discussed. Ideal goal is to keep blood pressure low elevated levels certainly below 277/82 when possible. The patient was counseled that keeping blood pressure under control lessen his risk of heart attack, stroke, kidney failure, and early death. The importance of regular follow-ups was discussed with the patient. Low-salt diet such as DASH recommended. Regular physical activity was recommended as well. Patient was advised to keep regular follow-ups.  We have chosen for the patient to take losartan 25 mg daily this will help proteinuria related to his diabetes as well as help his heart and keep blood pressure under control Stop HCTZ Lab work indicated as a follow-up on potassium and kidney function Follow-up again in 4 months time for diabetes blood pressure

## 2017-12-28 LAB — BASIC METABOLIC PANEL
BUN / CREAT RATIO: 22 (ref 10–24)
BUN: 24 mg/dL (ref 8–27)
CHLORIDE: 101 mmol/L (ref 96–106)
CO2: 27 mmol/L (ref 20–29)
Calcium: 9.9 mg/dL (ref 8.6–10.2)
Creatinine, Ser: 1.08 mg/dL (ref 0.76–1.27)
GFR calc non Af Amer: 63 mL/min/{1.73_m2} (ref >59)
GFR, EST AFRICAN AMERICAN: 72 mL/min/{1.73_m2} (ref >59)
Glucose: 215 mg/dL — ABNORMAL HIGH (ref 65–99)
POTASSIUM: 4.3 mmol/L (ref 3.5–5.2)
Sodium: 141 mmol/L (ref 134–144)

## 2017-12-28 LAB — MAGNESIUM: Magnesium: 1.8 mg/dL (ref 1.6–2.3)

## 2018-01-06 ENCOUNTER — Ambulatory Visit (INDEPENDENT_AMBULATORY_CARE_PROVIDER_SITE_OTHER): Payer: Medicare Other | Admitting: Family Medicine

## 2018-01-06 VITALS — BP 118/72 | Ht 74.0 in | Wt 166.0 lb

## 2018-01-06 DIAGNOSIS — I951 Orthostatic hypotension: Secondary | ICD-10-CM

## 2018-01-06 NOTE — Patient Instructions (Signed)
Stop Losartan   

## 2018-01-06 NOTE — Progress Notes (Signed)
   Subjective:    Patient ID: Luke Solis, male    DOB: [birth year], 84 y.o.   MRN: [id number]  HPI Patient arrives with c/o of dizzy spell last night. Significant dizziness Yesterday he was sitting in recliner when he went to get up he felt very weak and lightheaded.  He states he had a difficult time walking without the assistance of his wife because of feeling so weak.  Denied any true unilateral numbness or weakness denied room spinning.  Denies nausea vomiting diarrhea sweats chest pain or shortness of breath.  PMH frequent orthostatic hypotension recently stopped HCTZ  Review of Systems  Constitutional: Negative for activity change, fatigue and fever.  HENT: Negative for congestion and rhinorrhea.   Respiratory: Negative for cough and shortness of breath.   Cardiovascular: Negative for chest pain and leg swelling.  Gastrointestinal: Negative for abdominal pain, diarrhea and nausea.  Genitourinary: Negative for dysuria and hematuria.  Neurological: Negative for weakness and headaches.  Psychiatric/Behavioral: Negative for behavioral problems.       Objective:   Physical Exam  Constitutional: He appears well-nourished. No distress.  HENT:  Head: Normocephalic and atraumatic.  Eyes: Right eye exhibits no discharge. Left eye exhibits no discharge.  Neck: No tracheal deviation present.  Cardiovascular: Normal rate, regular rhythm and normal heart sounds.  No murmur heard. Pulmonary/Chest: Effort normal and breath sounds normal. No respiratory distress. He has no wheezes.  Musculoskeletal: He exhibits no edema.  Lymphadenopathy:    He has no cervical adenopathy.  Neurological: He is alert.  Skin: Skin is warm. No rash noted.  Psychiatric: His behavior is normal.  Vitals reviewed.  Orthostatics blood pressure approximately 128/70 laying 120/66 sitting 110/64 standing       Assessment & Plan:  Concerning orthostatic hypotension I watched the patient walk no sign of  stroke I doubt Parkinson's no cogwheeling Stop losartan Follow-up in 7-10 days to recheck No further testing currently Certainly if the patient passes out has severe headaches or if worse follow-up immediately here or ER

## 2018-01-09 ENCOUNTER — Other Ambulatory Visit: Payer: Self-pay | Admitting: Family Medicine

## 2018-01-12 DIAGNOSIS — L609 Nail disorder, unspecified: Secondary | ICD-10-CM | POA: Diagnosis not present

## 2018-01-12 DIAGNOSIS — E1140 Type 2 diabetes mellitus with diabetic neuropathy, unspecified: Secondary | ICD-10-CM | POA: Diagnosis not present

## 2018-01-12 DIAGNOSIS — M79671 Pain in right foot: Secondary | ICD-10-CM | POA: Diagnosis not present

## 2018-01-12 DIAGNOSIS — M79672 Pain in left foot: Secondary | ICD-10-CM | POA: Diagnosis not present

## 2018-01-19 ENCOUNTER — Encounter: Payer: Self-pay | Admitting: Family Medicine

## 2018-01-19 ENCOUNTER — Ambulatory Visit (INDEPENDENT_AMBULATORY_CARE_PROVIDER_SITE_OTHER): Payer: Medicare Other | Admitting: Family Medicine

## 2018-01-19 VITALS — Ht 74.0 in | Wt 166.0 lb

## 2018-01-19 DIAGNOSIS — I10 Essential (primary) hypertension: Secondary | ICD-10-CM | POA: Diagnosis not present

## 2018-01-19 DIAGNOSIS — E785 Hyperlipidemia, unspecified: Secondary | ICD-10-CM | POA: Diagnosis not present

## 2018-01-19 DIAGNOSIS — E119 Type 2 diabetes mellitus without complications: Secondary | ICD-10-CM | POA: Diagnosis not present

## 2018-01-19 NOTE — Progress Notes (Signed)
   Subjective:    Patient ID: Luke Solis, male    DOB: 1931-06-06, 84 y.o.   MRN: [id number]  HPI Patient returns today for a follow up on orthostatic hypotension. Patient had significant orthostasis previously patient also had blood pressure was low therefore was stopped from taking losartan denying any high fever chills sweats denies coughing up anything no falls no true ataxia Review of Systems  Constitutional: Negative for activity change, fatigue and fever.  HENT: Negative for congestion and rhinorrhea.   Respiratory: Negative for cough and shortness of breath.   Cardiovascular: Negative for chest pain and leg swelling.  Gastrointestinal: Negative for abdominal pain, diarrhea and nausea.  Genitourinary: Negative for dysuria and hematuria.  Neurological: Negative for weakness and headaches.  Psychiatric/Behavioral: Negative for behavioral problems.       Objective:   Physical Exam  Constitutional: He appears well-nourished. No distress.  HENT:  Head: Normocephalic and atraumatic.  Eyes: Right eye exhibits no discharge. Left eye exhibits no discharge.  Neck: No tracheal deviation present.  Cardiovascular: Normal rate, regular rhythm and normal heart sounds.  No murmur heard. Pulmonary/Chest: Effort normal and breath sounds normal. No respiratory distress. He has no wheezes.  Musculoskeletal: He exhibits no edema.  Lymphadenopathy:    He has no cervical adenopathy.  Neurological: He is alert.  Skin: Skin is warm. No rash noted.  Psychiatric: His behavior is normal.  Vitals reviewed.  No cogwheeling no shuffle I do not feel patient has Parkinson's       Assessment & Plan:  Orthostatics are normal We stay off the losartan Patient was advised to he should consider using a cane when he walks because he does waiver some  Blood pressure stable  Lab work before next office visit later in the spring

## 2018-02-28 ENCOUNTER — Encounter (HOSPITAL_COMMUNITY): Payer: Self-pay | Admitting: Cardiology

## 2018-02-28 ENCOUNTER — Ambulatory Visit (INDEPENDENT_AMBULATORY_CARE_PROVIDER_SITE_OTHER): Payer: Medicare Other | Admitting: Family Medicine

## 2018-02-28 ENCOUNTER — Inpatient Hospital Stay (HOSPITAL_COMMUNITY)
Admission: EM | Admit: 2018-02-28 | Discharge: 2018-03-03 | DRG: 066 | Disposition: A | Discharge status: Home Health | Payer: Medicare Other | Attending: Family Medicine | Admitting: Family Medicine

## 2018-02-28 ENCOUNTER — Encounter: Payer: Self-pay | Admitting: Family Medicine

## 2018-02-28 ENCOUNTER — Emergency Department (HOSPITAL_COMMUNITY): Payer: Medicare Other

## 2018-02-28 VITALS — BP 132/86 | Ht 74.0 in | Wt 166.2 lb

## 2018-02-28 DIAGNOSIS — G4733 Obstructive sleep apnea (adult) (pediatric): Secondary | ICD-10-CM | POA: Diagnosis not present

## 2018-02-28 DIAGNOSIS — I2510 Atherosclerotic heart disease of native coronary artery without angina pectoris: Secondary | ICD-10-CM | POA: Diagnosis present

## 2018-02-28 DIAGNOSIS — I639 Cerebral infarction, unspecified: Secondary | ICD-10-CM | POA: Diagnosis not present

## 2018-02-28 DIAGNOSIS — D649 Anemia, unspecified: Secondary | ICD-10-CM | POA: Diagnosis present

## 2018-02-28 DIAGNOSIS — E785 Hyperlipidemia, unspecified: Secondary | ICD-10-CM | POA: Diagnosis present

## 2018-02-28 DIAGNOSIS — I10 Essential (primary) hypertension: Secondary | ICD-10-CM | POA: Diagnosis present

## 2018-02-28 DIAGNOSIS — Z7982 Long term (current) use of aspirin: Secondary | ICD-10-CM

## 2018-02-28 DIAGNOSIS — R531 Weakness: Secondary | ICD-10-CM | POA: Diagnosis not present

## 2018-02-28 DIAGNOSIS — E1140 Type 2 diabetes mellitus with diabetic neuropathy, unspecified: Secondary | ICD-10-CM | POA: Diagnosis present

## 2018-02-28 DIAGNOSIS — R471 Dysarthria and anarthria: Secondary | ICD-10-CM | POA: Diagnosis present

## 2018-02-28 DIAGNOSIS — Z7984 Long term (current) use of oral hypoglycemic drugs: Secondary | ICD-10-CM

## 2018-02-28 DIAGNOSIS — Z79899 Other long term (current) drug therapy: Secondary | ICD-10-CM

## 2018-02-28 DIAGNOSIS — R4182 Altered mental status, unspecified: Secondary | ICD-10-CM | POA: Diagnosis present

## 2018-02-28 DIAGNOSIS — I440 Atrioventricular block, first degree: Secondary | ICD-10-CM | POA: Diagnosis present

## 2018-02-28 DIAGNOSIS — R410 Disorientation, unspecified: Secondary | ICD-10-CM | POA: Diagnosis not present

## 2018-02-28 DIAGNOSIS — I252 Old myocardial infarction: Secondary | ICD-10-CM

## 2018-02-28 DIAGNOSIS — R29701 NIHSS score 1: Secondary | ICD-10-CM | POA: Diagnosis present

## 2018-02-28 DIAGNOSIS — R4020 Unspecified coma: Secondary | ICD-10-CM | POA: Diagnosis not present

## 2018-02-28 DIAGNOSIS — Z8546 Personal history of malignant neoplasm of prostate: Secondary | ICD-10-CM

## 2018-02-28 LAB — CBC WITH DIFFERENTIAL/PLATELET
BASOS ABS: 0 10*3/uL (ref 0.0–0.1)
Basophils Relative: 0 %
Eosinophils Absolute: 0.3 10*3/uL (ref 0.0–0.7)
Eosinophils Relative: 6 %
HEMATOCRIT: 36.6 % — AB (ref 39.0–52.0)
HEMOGLOBIN: 12.2 g/dL — AB (ref 13.0–17.0)
LYMPHS PCT: 22 %
Lymphs Abs: 1.1 10*3/uL (ref 0.7–4.0)
MCH: 30.9 pg (ref 26.0–34.0)
MCHC: 33.3 g/dL (ref 30.0–36.0)
MCV: 92.7 fL (ref 78.0–100.0)
MONO ABS: 0.4 10*3/uL (ref 0.1–1.0)
Monocytes Relative: 8 %
NEUTROS ABS: 3.3 10*3/uL (ref 1.7–7.7)
NEUTROS PCT: 64 %
Platelets: 155 10*3/uL (ref 150–400)
RBC: 3.95 MIL/uL — AB (ref 4.22–5.81)
RDW: 12.4 % (ref 11.5–15.5)
WBC: 5.1 10*3/uL (ref 4.0–10.5)

## 2018-02-28 LAB — URINALYSIS, ROUTINE W REFLEX MICROSCOPIC
Bacteria, UA: NONE SEEN
Bilirubin Urine: NEGATIVE
HGB URINE DIPSTICK: NEGATIVE
Ketones, ur: NEGATIVE mg/dL
Leukocytes, UA: NEGATIVE
Nitrite: NEGATIVE
PROTEIN: 30 mg/dL — AB
SPECIFIC GRAVITY, URINE: 1.027 (ref 1.005–1.030)
Squamous Epithelial / LPF: NONE SEEN
pH: 5 (ref 5.0–8.0)

## 2018-02-28 LAB — LIPASE, BLOOD: Lipase: 26 U/L (ref 11–51)

## 2018-02-28 LAB — COMPREHENSIVE METABOLIC PANEL
ALBUMIN: 3.7 g/dL (ref 3.5–5.0)
ALK PHOS: 61 U/L (ref 38–126)
ALT: 23 U/L (ref 17–63)
AST: 22 U/L (ref 15–41)
Anion gap: 9 (ref 5–15)
BILIRUBIN TOTAL: 0.5 mg/dL (ref 0.3–1.2)
BUN: 24 mg/dL — AB (ref 6–20)
CALCIUM: 9.2 mg/dL (ref 8.9–10.3)
CO2: 29 mmol/L (ref 22–32)
CREATININE: 1.04 mg/dL (ref 0.61–1.24)
Chloride: 104 mmol/L (ref 101–111)
GFR calc Af Amer: >60 mL/min (ref >60)
GLUCOSE: 279 mg/dL — AB (ref 65–99)
Potassium: 3.5 mmol/L (ref 3.5–5.1)
Sodium: 142 mmol/L (ref 135–145)
TOTAL PROTEIN: 6.8 g/dL (ref 6.5–8.1)

## 2018-02-28 LAB — TROPONIN I: Troponin I: <0.03 ng/mL (ref <0.03)

## 2018-02-28 MED ORDER — SODIUM CHLORIDE 0.9 % IV BOLUS
1000.0000 mL | Freq: Once | INTRAVENOUS | Status: AC
  Administered 2018-02-28: 1000 mL via INTRAVENOUS

## 2018-02-28 NOTE — ED Provider Notes (Signed)
[facility] EMERGENCY DEPARTMENT Provider Note   CSN: [id number] Arrival date & time: 02/28/18  1445     History   Chief Complaint Chief Complaint  Patient presents with  . Altered Mental Status    HPI Luke Solis is a 84 y.o. male.  Level 5 caveat for altered mental status.  Family states that patient has been slightly "off" for 2-3 days.  His thinking is not been as clear as usual.  He is slightly confused.  He does not answer questions precisely.  He has been staring off at times.  No prodromal illnesses.  No fever, sweats, chills, dysuria, chest pain, dyspnea, stiff neck.  Severity of symptoms is moderate.  He was seen by his primary care doctor today and sent to the emergency department.     Past Medical History:  Diagnosis Date  . Coronary atherosclerosis of native coronary artery    BMS proximal and mid RCA as well as BMS circumflex - 2002 (had residual 70% distal LAD), LVEF 60%  . Essential hypertension, benign   . Hyperlipidemia   . Neuropathy   . NSTEMI (non-ST elevated myocardial infarction) ([place])    2002  . Prostate cancer ([place])   . Sleep apnea    On CPAP    Patient Active Problem List   Diagnosis Date Noted  . Altered mental status 02/28/2018  . Anemia 02/28/2018  . Fatigue 08/03/2017  . Prostate cancer ([place]) 10/22/2015  . Forgetfulness 04/29/2015  . Type 2 diabetes mellitus with diabetic neuropathy ([place]) 07/17/2013  . Hyperlipidemia 01/19/2010  . OSA (obstructive sleep apnea) 01/19/2010  . Essential hypertension, benign 01/19/2010  . Coronary atherosclerosis of native coronary artery 01/19/2010    Past Surgical History:  Procedure Laterality Date  . COLONOSCOPY    . INGUINAL HERNIA REPAIR    . KNEE SURGERY    . NOSE SURGERY    . PROSTATE SURGERY    . TONSILLECTOMY          Home Medications    Prior to Admission medications   Medication Sig Start Date End Date Taking? Authorizing Provider  aspirin 81 MG tablet Take 81 mg by mouth  daily.   Yes [provider]  atorvastatin (LIPITOR) 80 MG tablet TAKE (1) TABLET BY MOUTH ONCE DAILY. 11/28/17  Yes Kathyrn Drown, MD  Calcium Carbonate-Vitamin D 600-400 MG-UNIT per tablet Take 1 tablet by mouth daily.   Yes [provider]  fexofenadine (ALLEGRA) 180 MG tablet Take 180 mg by mouth as needed.   Yes [provider]  metFORMIN (GLUCOPHAGE) 500 MG tablet TAKE 2 TABLETS ONCE DAILY WITH FOOD. 01/09/18  Yes [name], [name], MD  Multiple Vitamin (MULTIVITAMIN) tablet Take 1 tablet by mouth daily.   Yes [provider]  nitroGLYCERIN (NITROSTAT) 0.4 MG SL tablet Place 1 tablet (0.4 mg total) under the tongue every 5 (five) minutes as needed for chest pain. 07/22/17 11/01/20 Yes Kathyrn Drown, MD  nortriptyline (PAMELOR) 10 MG capsule 3 daily at bedtime Patient taking differently: Take 30 mg by mouth at bedtime. 3 daily at bedtime 07/22/17  Yes [name], Scott A, MD  pregabalin (LYRICA) 100 MG capsule TAKE 1 CAPSULE IN THE MORNING AND 2 CAPSULES AT BEDTIME. 09/23/17  Yes Kathyrn Drown, MD  FORACARE PREMIUM V10 TEST test strip USE AS DIRECTED. 07/20/17   Mikey Kirschner, MD    Family History Family History  Problem Relation Age of Onset  . Heart attack Mother   .  Diabetes Mother   . Coronary artery disease Father   . Hypertension Father   . Heart attack Father   . Lung disease Neg Hx   . Cancer Neg Hx     Social History Social History   Tobacco Use  . Smoking status: Never Smoker  . Smokeless tobacco: Never Used  Substance Use Topics  . Alcohol use: No    Alcohol/week: 0.0 oz  . Drug use: No     Allergies   Broccoli [brassica oleracea italica] and Celery oil   Review of Systems Review of Systems  Unable to perform ROS: Mental status change     Physical Exam Updated Vital Signs BP (!) 201/96   Pulse 74   Temp 97.7 F (36.5 C)   Resp 20   Ht 6\' 2"  (1.88 m)   Wt 75.3 kg (166 lb)   SpO2 100%   BMI 21.31 kg/m    Physical Exam  Constitutional: He is oriented to person, place, and time.  nad  HENT:  Head: Normocephalic and atraumatic.  Eyes: Conjunctivae are normal.  Neck: Neck supple.  Cardiovascular: Normal rate and regular rhythm.  Pulmonary/Chest: Effort normal and breath sounds normal.  Abdominal: Soft. Bowel sounds are normal.  Musculoskeletal: Normal range of motion.  Neurological: He is alert and oriented to person, place, and time.  Patient answers questions appropriately, but more slowly than usual (per his family)  Skin: Skin is warm and dry.  Psychiatric: He has a normal mood and affect. His behavior is normal.  Nursing note and vitals reviewed.    ED Treatments / Results  Labs (all labs ordered are listed, but only abnormal results are displayed) Labs Reviewed  CBC WITH DIFFERENTIAL/PLATELET - Abnormal; Notable for the following components:      Result Value   RBC 3.95 (*)    Hemoglobin 12.2 (*)    HCT 36.6 (*)    All other components within normal limits  COMPREHENSIVE METABOLIC PANEL - Abnormal; Notable for the following components:   Glucose, Bld 279 (*)    BUN 24 (*)    All other components within normal limits  URINALYSIS, ROUTINE W REFLEX MICROSCOPIC - Abnormal; Notable for the following components:   Glucose, UA >=500 (*)    Protein, ur 30 (*)    All other components within normal limits  LIPASE, BLOOD  TROPONIN I    EKG EKG Interpretation  Date/Time:  Tuesday February 28 2018 14:59:06 EDT Ventricular Rate:  61 PR Interval:  218 QRS Duration: 90 QT Interval:  420 QTC Calculation: 422 R Axis:   -48 Text Interpretation:  Sinus rhythm with 1st degree A-V block Left axis deviation Low voltage QRS Inferior infarct , age undetermined Cannot rule out Anterior infarct , age undetermined Abnormal ECG Confirmed by Nat Christen [phone]) on 02/28/2018 9:24:01 PM   Radiology Dg Chest 2 View  Result Date: 02/28/2018 CLINICAL DATA:  Altered mental status and  weakness. EXAM: CHEST - 2 VIEW COMPARISON:  Chest x-ray dated Apr 21, 2015. FINDINGS: The heart size and mediastinal contours are within normal limits. Normal pulmonary vascularity. Atherosclerotic calcification of the aortic arch. No focal consolidation, pleural effusion, or pneumothorax. Bilateral nipple shadows again noted. No acute osseous abnormality. IMPRESSION: No active cardiopulmonary disease. Electronically Signed   By: Titus Dubin M.D.   On: 02/28/2018 15:12   Ct Head Wo Contrast  Result Date: 02/28/2018 CLINICAL DATA:  Altered level of consciousness. EXAM: CT HEAD WITHOUT CONTRAST TECHNIQUE: Contiguous axial images  were obtained from the base of the skull through the vertex without intravenous contrast. COMPARISON:  None. FINDINGS: Brain: Mild diffuse cortical atrophy is noted. Mild chronic ischemic white matter disease is noted. No mass effect or midline shift is noted. Ventricular size is within normal limits. There is no evidence of mass lesion or hemorrhage. Right basal ganglia low density is noted consistent with infarction of indeterminate age. Vascular: No hyperdense vessel or unexpected calcification. Skull: Normal. Negative for fracture or focal lesion. Sinuses/Orbits: No acute finding. Other: None. IMPRESSION: Mild diffuse cortical atrophy. Mild chronic ischemic white matter disease. Right basal ganglia low density is noted consistent with infarction of indeterminate age; MRI may be performed for further evaluation. Electronically Signed   By: Marijo Conception, M.D.   On: 02/28/2018 21:51    Procedures Procedures (including critical care time)  Medications Ordered in ED Medications  sodium chloride 0.9 % bolus 1,000 mL (1,000 mLs Intravenous New Bag/Given 02/28/18 2240)     Initial Impression / Assessment and Plan / ED Course  I have reviewed the triage vital signs and the nursing notes.  Pertinent labs & imaging results that were available during my care of the patient  were reviewed by me and considered in my medical decision making (see chart for details).     Patient presents with altered mental status.  No prodromal illnesses.  Glucose is moderately elevated.  CT scan reveals a probable right basal ganglia infarct age-indeterminate.  Admit for further observation and MRI in the morning.  Final Clinical Impressions(s) / ED Diagnoses   Final diagnoses:  Altered mental status, unspecified altered mental status type    ED Discharge Orders    None       Nat Christen, MD 02/28/18 2342

## 2018-02-28 NOTE — H&P (Signed)
History and Physical    Luke Solis KGU:[id number] DOB: 1931/11/23 DOA: 02/28/2018  PCP: Kathyrn Drown, MD   Patient coming from: Home.  I have personally briefly reviewed patient's old medical records in [place]  Chief Complaint: AMS.  HPI: Luke Solis is a 84 y.o. male with medical history significant of coronary artery disease, essential hypertension, hyperlipidemia, peripheral neuropathy, prostate cancer, sleep apnea on CPAP who is coming to the emergency department due to his family members and patient noticed in that he has been having difficulty with his memory, has been having trouble finding words and occasionally understanding was being spoken to him and overall seems to be slower than usual.  He denies dizziness, headache, blurred vision, slurred speech or gait imbalance.  No fever, chills, sore throat, rhinorrhea, chest pain, dizziness, diaphoresis, dyspnea, PND, orthopnea or pitting edema of the lower extremities.  Denies abdominal pain, nausea, emesis, diarrhea, constipation, melena or hematochezia.  No dysuria or hematuria.  No polyphagia, polydipsia or polyuria.  Denies skin rashes or pruritus.  ED Course: Initial vital signs temperature 97.61F, pulse 60, respirations 18, blood pressure 163/75 mmHg and O2 sat 98% on room air.  Workup shows an UA with glucosuria of 500 mg/dL, proteinuria 30 mg/dL, the rest of the urinalysis is within normal limits.  White count was 5.1 with a normal differential, hemoglobin 12.2 g/dL and platelets 155.  CMP shows a glucose level of 279 and BUN of 24 mg/dL.  All other chemistry values are normal. EKG shows sinus rhythm with first-degree AV block, low QRS voltage, anterior and inferior infarct.  Imaging: CT of the head with contrast showed mild diffuse cortical atrophy.  Mild chronic ischemic white matter disease.  Right basal ganglia low-density is noted consistent with infarction of undetermined age.  MRI has been suggested   Review  of Systems: As per HPI otherwise 10 point review of systems negative.    Past Medical History:  Diagnosis Date  . Coronary atherosclerosis of native coronary artery    BMS proximal and mid RCA as well as BMS circumflex - 2002 (had residual 70% distal LAD), LVEF 60%  . Essential hypertension, benign   . Hyperlipidemia   . Neuropathy   . NSTEMI (non-ST elevated myocardial infarction) ([place])    2002  . Prostate cancer ([place])   . Sleep apnea    On CPAP    Past Surgical History:  Procedure Laterality Date  . COLONOSCOPY    . INGUINAL HERNIA REPAIR    . KNEE SURGERY    . NOSE SURGERY    . PROSTATE SURGERY    . TONSILLECTOMY       reports that he has never smoked. He has never used smokeless tobacco. He reports that he does not drink alcohol or use drugs.  Allergies  Allergen Reactions  . Broccoli [Brassica Oleracea Italica] Nausea And Vomiting  . Celery Oil Nausea And Vomiting    Family History  Problem Relation Age of Onset  . Heart attack Mother   . Diabetes Mother   . Coronary artery disease Father   . Hypertension Father   . Heart attack Father   . Lung disease Neg Hx   . Cancer Neg Hx     Prior to Admission medications   Medication Sig Start Date End Date Taking? Authorizing Provider  aspirin 81 MG tablet Take 81 mg by mouth daily.   Yes [provider]  atorvastatin (LIPITOR) 80 MG tablet TAKE (1) TABLET  BY MOUTH ONCE DAILY. 11/28/17  Yes Kathyrn Drown, MD  Calcium Carbonate-Vitamin D 600-400 MG-UNIT per tablet Take 1 tablet by mouth daily.   Yes [provider]  fexofenadine (ALLEGRA) 180 MG tablet Take 180 mg by mouth as needed.   Yes [provider]  metFORMIN (GLUCOPHAGE) 500 MG tablet TAKE 2 TABLETS ONCE DAILY WITH FOOD. 01/09/18  Yes [name], [name], MD  Multiple Vitamin (MULTIVITAMIN) tablet Take 1 tablet by mouth daily.   Yes [provider]  nitroGLYCERIN (NITROSTAT) 0.4 MG SL tablet Place 1 tablet (0.4 mg total) under  the tongue every 5 (five) minutes as needed for chest pain. 07/22/17 11/01/20 Yes Kathyrn Drown, MD  nortriptyline (PAMELOR) 10 MG capsule 3 daily at bedtime Patient taking differently: Take 30 mg by mouth at bedtime. 3 daily at bedtime 07/22/17  Yes [name], [name], MD  pregabalin (LYRICA) 100 MG capsule TAKE 1 CAPSULE IN THE MORNING AND 2 CAPSULES AT BEDTIME. 09/23/17  Yes Kathyrn Drown, MD  FORACARE PREMIUM V10 TEST test strip USE AS DIRECTED. 07/20/17   Mikey Kirschner, MD    Physical Exam: Vitals:   02/28/18 2100 02/28/18 2116 02/28/18 2130 02/28/18 2300  BP: (!) 203/89  (!) 193/86 (!) 201/96  Pulse: (!) 59  64 74  Resp: 19  20 20   Temp:  97.7 F (36.5 C)    TempSrc:      SpO2: 99%  100% 100%  Weight:      Height:        Constitutional: NAD, calm, comfortable Eyes: PERRL, lids and conjunctivae normal ENMT: Mucous membranes are moist. Posterior pharynx clear of any exudate or lesions.  Neck: normal, supple, no masses, no thyromegaly Respiratory: clear to auscultation bilaterally, no wheezing, no crackles. Normal respiratory effort. No accessory muscle use.  Cardiovascular: Regular rate and rhythm, no murmurs / rubs / gallops. No extremity edema. 2+ pedal pulses. No carotid bruits.  Abdomen: Soft, no tenderness, no masses palpated. No hepatosplenomegaly. Bowel sounds positive.  Musculoskeletal: no clubbing / cyanosis. Good ROM, no contractures. Normal muscle tone.  Skin: no rashes, lesions, ulcers on limited dermatological examination. Neurologic: CN 2-12 grossly intact. Sensation intact, DTR normal. Strength 5/5 in all 4.  Normal coordination with nose to finger.  No pronator drift.  Gait was deferred. Psychiatric: Normal judgment and insight. Alert and oriented x 4 with some difficulty and slow responses. Normal mood.    Labs on Admission: I have personally reviewed following labs and imaging studies  CBC: Recent Labs  Lab 02/28/18 1523  WBC 5.1  NEUTROABS 3.3  HGB  12.2*  HCT 36.6*  MCV 92.7  PLT 287   Basic Metabolic Panel: Recent Labs  Lab 02/28/18 1523  NA 142  K 3.5  CL 104  CO2 29  GLUCOSE 279*  BUN 24*  CREATININE 1.04  CALCIUM 9.2   GFR: Estimated Creatinine Clearance: 56.3 mL/min (by C-G formula based on SCr of 1.04 mg/dL). Liver Function Tests: Recent Labs  Lab 02/28/18 1523  AST 22  ALT 23  ALKPHOS 61  BILITOT 0.5  PROT 6.8  ALBUMIN 3.7   Recent Labs  Lab 02/28/18 2240  LIPASE 26   No results for input(s): AMMONIA in the last 168 hours. Coagulation Profile: No results for input(s): INR, PROTIME in the last 168 hours. Cardiac Enzymes: Recent Labs  Lab 02/28/18 2240  TROPONINI <0.03   BNP (last 3 results) No results for input(s): PROBNP in the last 8760  hours. HbA1C: No results for input(s): HGBA1C in the last 72 hours. CBG: No results for input(s): GLUCAP in the last 168 hours. Lipid Profile: No results for input(s): CHOL, HDL, LDLCALC, TRIG, CHOLHDL, LDLDIRECT in the last 72 hours. Thyroid Function Tests: No results for input(s): TSH, T4TOTAL, FREET4, T3FREE, THYROIDAB in the last 72 hours. Anemia Panel: No results for input(s): VITAMINB12, FOLATE, FERRITIN, TIBC, IRON, RETICCTPCT in the last 72 hours. Urine analysis:    Component Value Date/Time   COLORURINE YELLOW 02/28/2018 [name] 02/28/2018 1458   LABSPEC 1.027 02/28/2018 1458   PHURINE 5.0 02/28/2018 1458   GLUCOSEU >=500 (A) 02/28/2018 1458   HGBUR NEGATIVE 02/28/2018 1458   BILIRUBINUR NEGATIVE 02/28/2018 1458   KETONESUR NEGATIVE 02/28/2018 1458   PROTEINUR 30 (A) 02/28/2018 1458   UROBILINOGEN 0.2 09/11/2007 1600   NITRITE NEGATIVE 02/28/2018 1458   LEUKOCYTESUR NEGATIVE 02/28/2018 1458    Radiological Exams on Admission: Dg Chest 2 View  Result Date: 02/28/2018 CLINICAL DATA:  Altered mental status and weakness. EXAM: CHEST - 2 VIEW COMPARISON:  Chest x-ray dated Apr 21, 2015. FINDINGS: The heart size and  mediastinal contours are within normal limits. Normal pulmonary vascularity. Atherosclerotic calcification of the aortic arch. No focal consolidation, pleural effusion, or pneumothorax. Bilateral nipple shadows again noted. No acute osseous abnormality. IMPRESSION: No active cardiopulmonary disease. Electronically Signed   By: Titus Dubin M.D.   On: 02/28/2018 15:12   Ct Head Wo Contrast  Result Date: 02/28/2018 CLINICAL DATA:  Altered level of consciousness. EXAM: CT HEAD WITHOUT CONTRAST TECHNIQUE: Contiguous axial images were obtained from the base of the skull through the vertex without intravenous contrast. COMPARISON:  None. FINDINGS: Brain: Mild diffuse cortical atrophy is noted. Mild chronic ischemic white matter disease is noted. No mass effect or midline shift is noted. Ventricular size is within normal limits. There is no evidence of mass lesion or hemorrhage. Right basal ganglia low density is noted consistent with infarction of indeterminate age. Vascular: No hyperdense vessel or unexpected calcification. Skull: Normal. Negative for fracture or focal lesion. Sinuses/Orbits: No acute finding. Other: None. IMPRESSION: Mild diffuse cortical atrophy. Mild chronic ischemic white matter disease. Right basal ganglia low density is noted consistent with infarction of indeterminate age; MRI may be performed for further evaluation. Electronically Signed   By: Marijo Conception, M.D.   On: 02/28/2018 21:51    EKG: Independently reviewed. Vent. rate 61 BPM PR interval 218 ms QRS duration 90 ms QT/QTc 420/422 ms P-R-T axes 71 -48 50 Sinus rhythm with 1st degree A-V block Left axis deviation Low voltage QRS Inferior infarct , age undetermined Cannot rule out Anterior infarct , age undetermined Abnormal ECG  Assessment/Plan Principal Problem:   Altered mental status Observation/telemetry. Frequent neuro checks. Given CT findings, will get stroke workup including MRI.Marland Kitchen Consider neurology  evaluation  Active Problems:   Hyperlipidemia Continue atorvastatin 80 mg p.o. at bedtime.    OSA (obstructive sleep apnea) Continue nocturnal CPAP.    Essential hypertension, benign Not on antihypertensives at home. Allow permissive hypertension, pending MRI scan.    Coronary atherosclerosis of native coronary artery Denies any recent chest pain. Continue aspirin and atorvastatin. Nitroglycerin as needed.    Type 2 diabetes mellitus with diabetic neuropathy (HCC) Carbohydrate modified diet. Continue metformin 1000 mg p.o. daily. Continue pregabalin 100 mg p.o. in a.m. Continue pregabalin 200 mg p.o. at bedtime.    Anemia Check anemia panel.  Monitor hematocrit and hemoglobin  DVT prophylaxis: Lovenox SQ. Code Status: Full code. Family Communication:  Disposition Plan: Observation for TIA/CVA workup. Consults called:  Admission status: Observation/telemetry.   Reubin Milan MD [facility] Pager [phone].  If 7PM-7AM, please contact night-coverage [email or web address] Password [facility]  02/28/2018, 11:32 PM

## 2018-02-28 NOTE — ED Triage Notes (Signed)
Pt responding slower than usual .  Symptoms started Sunday.

## 2018-02-28 NOTE — Progress Notes (Signed)
   Subjective:    Patient ID: Luke Solis, male    DOB: 05-Feb-1931, 84 y.o.   MRN: [id number]  HPI Patient arrives with a change in mental status. Family states the patient has been sleeping all the time since Sunday and is not himself. Patient is hard to focus and respond and is not how he normally is per family.  Pt "not himself "  Started Sunday, stated did not feel good  Slept thru Sunday school slept thrus church  Then rest of the day on Monday did not feel good, [place] to [place]    Not answering as well, not quick to respond  Dim enrgy and appetite and   No respose , no  Using sleep machine   not his usual inte4actioin   No fever no cough no infxn   Dim energy and dim response     Review of Systems No headache, no major weight loss or weight gain, no chest pain no back pain abdominal pain no change in bowel habits complete ROS otherwise negative     Objective:   Physical Exam  Alert but not as alert as usual, somewhat slow in his responses, oriented HEENT normal lungs clear heart regular rate and rhythm abdominal exam benign no focal neurological deficits, feet bilateral dependent rubor pulses good no tenderness      Assessment & Plan:  Impression acute mental status changes.  Discussion with family.  Need to move along with urgent workup.  To most common etiologies for this are neurological and infectious in etiology.  Discussed with family.  Spoke with the emergency room.  We will press on with urgent workup.  I recommended brain scanning along with a laboratory workup

## 2018-03-01 ENCOUNTER — Other Ambulatory Visit: Payer: Self-pay

## 2018-03-01 ENCOUNTER — Observation Stay (HOSPITAL_COMMUNITY): Payer: Medicare Other

## 2018-03-01 ENCOUNTER — Encounter (HOSPITAL_COMMUNITY): Payer: Self-pay | Admitting: Internal Medicine

## 2018-03-01 DIAGNOSIS — R4182 Altered mental status, unspecified: Secondary | ICD-10-CM

## 2018-03-01 DIAGNOSIS — G459 Transient cerebral ischemic attack, unspecified: Secondary | ICD-10-CM

## 2018-03-01 DIAGNOSIS — I252 Old myocardial infarction: Secondary | ICD-10-CM | POA: Diagnosis not present

## 2018-03-01 DIAGNOSIS — I639 Cerebral infarction, unspecified: Principal | ICD-10-CM

## 2018-03-01 DIAGNOSIS — Z7982 Long term (current) use of aspirin: Secondary | ICD-10-CM | POA: Diagnosis not present

## 2018-03-01 DIAGNOSIS — E1140 Type 2 diabetes mellitus with diabetic neuropathy, unspecified: Secondary | ICD-10-CM | POA: Diagnosis not present

## 2018-03-01 DIAGNOSIS — I6523 Occlusion and stenosis of bilateral carotid arteries: Secondary | ICD-10-CM | POA: Diagnosis not present

## 2018-03-01 DIAGNOSIS — I6389 Other cerebral infarction: Secondary | ICD-10-CM | POA: Diagnosis not present

## 2018-03-01 DIAGNOSIS — Z7984 Long term (current) use of oral hypoglycemic drugs: Secondary | ICD-10-CM | POA: Diagnosis not present

## 2018-03-01 DIAGNOSIS — Z8546 Personal history of malignant neoplasm of prostate: Secondary | ICD-10-CM | POA: Diagnosis not present

## 2018-03-01 DIAGNOSIS — D649 Anemia, unspecified: Secondary | ICD-10-CM | POA: Diagnosis present

## 2018-03-01 DIAGNOSIS — G4733 Obstructive sleep apnea (adult) (pediatric): Secondary | ICD-10-CM | POA: Diagnosis not present

## 2018-03-01 DIAGNOSIS — I440 Atrioventricular block, first degree: Secondary | ICD-10-CM | POA: Diagnosis present

## 2018-03-01 DIAGNOSIS — E7849 Other hyperlipidemia: Secondary | ICD-10-CM

## 2018-03-01 DIAGNOSIS — R471 Dysarthria and anarthria: Secondary | ICD-10-CM | POA: Diagnosis present

## 2018-03-01 DIAGNOSIS — I10 Essential (primary) hypertension: Secondary | ICD-10-CM

## 2018-03-01 DIAGNOSIS — E785 Hyperlipidemia, unspecified: Secondary | ICD-10-CM | POA: Diagnosis present

## 2018-03-01 DIAGNOSIS — I2510 Atherosclerotic heart disease of native coronary artery without angina pectoris: Secondary | ICD-10-CM | POA: Diagnosis not present

## 2018-03-01 DIAGNOSIS — Z79899 Other long term (current) drug therapy: Secondary | ICD-10-CM | POA: Diagnosis not present

## 2018-03-01 DIAGNOSIS — R29701 NIHSS score 1: Secondary | ICD-10-CM | POA: Diagnosis present

## 2018-03-01 LAB — LIPID PANEL
Cholesterol: 151 mg/dL (ref 0–200)
HDL: 48 mg/dL (ref >40)
LDL Cholesterol: 86 mg/dL (ref 0–99)
Total CHOL/HDL Ratio: 3.1 RATIO
Triglycerides: 83 mg/dL (ref <150)
VLDL: 17 mg/dL (ref 0–40)

## 2018-03-01 LAB — ECHOCARDIOGRAM COMPLETE
HEIGHTINCHES: 74 in
Weight: 2656 oz

## 2018-03-01 LAB — HEMOGLOBIN A1C
HEMOGLOBIN A1C: 7.2 % — AB (ref 4.8–5.6)
MEAN PLASMA GLUCOSE: 159.94 mg/dL

## 2018-03-01 LAB — FERRITIN: FERRITIN: 33 ng/mL (ref 24–336)

## 2018-03-01 LAB — GLUCOSE, CAPILLARY
GLUCOSE-CAPILLARY: 146 mg/dL — AB (ref 65–99)
GLUCOSE-CAPILLARY: 167 mg/dL — AB (ref 65–99)

## 2018-03-01 LAB — FOLATE: FOLATE: 29 ng/mL (ref >5.9)

## 2018-03-01 LAB — VITAMIN B12: VITAMIN B 12: 405 pg/mL (ref 180–914)

## 2018-03-01 LAB — CBG MONITORING, ED
GLUCOSE-CAPILLARY: 135 mg/dL — AB (ref 65–99)
Glucose-Capillary: 167 mg/dL — ABNORMAL HIGH (ref 65–99)

## 2018-03-01 LAB — RETICULOCYTES
RBC.: 4.44 MIL/uL (ref 4.22–5.81)
RETIC COUNT ABSOLUTE: 66.6 10*3/uL (ref 19.0–186.0)
Retic Ct Pct: 1.5 % (ref 0.4–3.1)

## 2018-03-01 LAB — IRON AND TIBC
IRON: 85 ug/dL (ref 45–182)
Saturation Ratios: 29 % (ref 17.9–39.5)
TIBC: 290 ug/dL (ref 250–450)
UIBC: 205 ug/dL

## 2018-03-01 MED ORDER — SENNOSIDES-DOCUSATE SODIUM 8.6-50 MG PO TABS
1.0000 | ORAL_TABLET | Freq: Every evening | ORAL | Status: DC | PRN
  Filled 2018-03-01: qty 1

## 2018-03-01 MED ORDER — ONDANSETRON HCL 4 MG/2ML IJ SOLN
4.0000 mg | Freq: Four times a day (QID) | INTRAMUSCULAR | Status: DC | PRN
  Administered 2018-03-01: 4 mg via INTRAVENOUS
  Filled 2018-03-01: qty 2

## 2018-03-01 MED ORDER — NORTRIPTYLINE HCL 10 MG PO CAPS
30.0000 mg | ORAL_CAPSULE | Freq: Every day | ORAL | Status: DC
  Administered 2018-03-01 – 2018-03-02 (×2): 30 mg via ORAL
  Filled 2018-03-01 (×5): qty 3

## 2018-03-01 MED ORDER — PREGABALIN 75 MG PO CAPS
200.0000 mg | ORAL_CAPSULE | Freq: Every day | ORAL | Status: DC
  Administered 2018-03-01 – 2018-03-02 (×2): 200 mg via ORAL
  Filled 2018-03-01: qty 1
  Filled 2018-03-01: qty 2

## 2018-03-01 MED ORDER — NITROGLYCERIN 0.4 MG SL SUBL: 0.4000 mg | SUBLINGUAL_TABLET | SUBLINGUAL | Status: DC | PRN

## 2018-03-01 MED ORDER — ACETAMINOPHEN 650 MG RE SUPP: 650.0000 mg | RECTAL | Status: DC | PRN

## 2018-03-01 MED ORDER — ENOXAPARIN SODIUM 40 MG/0.4ML ~~LOC~~ SOLN
40.0000 mg | SUBCUTANEOUS | Status: DC
  Administered 2018-03-01 – 2018-03-02 (×2): 40 mg via SUBCUTANEOUS
  Filled 2018-03-01 (×2): qty 0.4

## 2018-03-01 MED ORDER — METFORMIN HCL 500 MG PO TABS
1000.0000 mg | ORAL_TABLET | Freq: Every day | ORAL | Status: DC
  Administered 2018-03-02 – 2018-03-03 (×2): 1000 mg via ORAL
  Filled 2018-03-01 (×2): qty 2

## 2018-03-01 MED ORDER — PREGABALIN 50 MG PO CAPS: 50.0000 mg | ORAL_CAPSULE | Freq: Every day | ORAL | Status: DC

## 2018-03-01 MED ORDER — ACETAMINOPHEN 160 MG/5ML PO SOLN: 650.0000 mg | ORAL | Status: DC | PRN

## 2018-03-01 MED ORDER — ACETAMINOPHEN 650 MG RE SUPP: 650.0000 mg | Freq: Four times a day (QID) | RECTAL | Status: DC | PRN

## 2018-03-01 MED ORDER — ACETAMINOPHEN 325 MG PO TABS: 650.0000 mg | ORAL_TABLET | ORAL | Status: DC | PRN

## 2018-03-01 MED ORDER — ASPIRIN 81 MG PO CHEW
81.0000 mg | CHEWABLE_TABLET | Freq: Every day | ORAL | Status: DC
  Administered 2018-03-01: 81 mg via ORAL
  Filled 2018-03-01: qty 1

## 2018-03-01 MED ORDER — INSULIN ASPART 100 UNIT/ML ~~LOC~~ SOLN
0.0000 [IU] | Freq: Three times a day (TID) | SUBCUTANEOUS | Status: DC
  Administered 2018-03-01 (×2): 2 [IU] via SUBCUTANEOUS
  Administered 2018-03-01 – 2018-03-02 (×2): 3 [IU] via SUBCUTANEOUS
  Administered 2018-03-02 – 2018-03-03 (×2): 2 [IU] via SUBCUTANEOUS
  Filled 2018-03-01 (×2): qty 1

## 2018-03-01 MED ORDER — ATORVASTATIN CALCIUM 40 MG PO TABS
80.0000 mg | ORAL_TABLET | Freq: Every day | ORAL | Status: DC
  Administered 2018-03-01 – 2018-03-02 (×2): 80 mg via ORAL
  Filled 2018-03-01 (×4): qty 2

## 2018-03-01 MED ORDER — ACETAMINOPHEN 325 MG PO TABS: 650.0000 mg | ORAL_TABLET | Freq: Four times a day (QID) | ORAL | Status: DC | PRN

## 2018-03-01 MED ORDER — ADULT MULTIVITAMIN W/MINERALS CH
1.0000 | ORAL_TABLET | Freq: Every day | ORAL | Status: DC
  Administered 2018-03-01 – 2018-03-03 (×3): 1 via ORAL
  Filled 2018-03-01 (×3): qty 1

## 2018-03-01 MED ORDER — CALCIUM CARBONATE-VITAMIN D 500-200 MG-UNIT PO TABS
1.0000 | ORAL_TABLET | Freq: Every day | ORAL | Status: DC
  Administered 2018-03-01 – 2018-03-03 (×3): 1 via ORAL
  Filled 2018-03-01 (×3): qty 1

## 2018-03-01 MED ORDER — PREGABALIN 50 MG PO CAPS
100.0000 mg | ORAL_CAPSULE | Freq: Every day | ORAL | Status: DC
  Administered 2018-03-01 – 2018-03-03 (×3): 100 mg via ORAL
  Filled 2018-03-01 (×3): qty 2

## 2018-03-01 MED ORDER — STROKE: EARLY STAGES OF RECOVERY BOOK
Freq: Once | Status: DC
  Filled 2018-03-01: qty 1

## 2018-03-01 MED ORDER — LORATADINE 10 MG PO TABS
10.0000 mg | ORAL_TABLET | Freq: Every day | ORAL | Status: DC
  Administered 2018-03-02 – 2018-03-03 (×2): 10 mg via ORAL
  Filled 2018-03-01 (×3): qty 1

## 2018-03-01 MED ORDER — ONDANSETRON HCL 4 MG PO TABS: 4.0000 mg | ORAL_TABLET | Freq: Four times a day (QID) | ORAL | Status: DC | PRN

## 2018-03-01 MED ORDER — ENOXAPARIN SODIUM 40 MG/0.4ML ~~LOC~~ SOLN: 40.0000 mg | SUBCUTANEOUS | Status: DC

## 2018-03-01 NOTE — Plan of Care (Signed)
  Problem: Acute Rehab PT Goals(only PT should resolve) Goal: Pt Will Go Supine/Side To Sit Outcome: Progressing Flowsheets (Taken 03/01/2018 1224) Pt will go Supine/Side to Sit: with modified independence Goal: Patient Will Transfer Sit To/From Stand Outcome: Progressing Flowsheets (Taken 03/01/2018 1224) Patient will transfer sit to/from stand: with modified independence Goal: Pt Will Transfer Bed To Chair/Chair To Bed Outcome: Progressing Flowsheets (Taken 03/01/2018 1224) Pt will Transfer Bed to Chair/Chair to Bed: with modified independence Goal: Pt Will Ambulate Outcome: Progressing Flowsheets (Taken 03/01/2018 1224) Pt will Ambulate: > 125 feet;with modified independence;with cane  12:25 PM, 03/01/18 Lonell Grandchild, MPT Physical Therapist with [facility] [phone] office [phone] mobile phone

## 2018-03-01 NOTE — Progress Notes (Signed)
Patient's daughter expressed concerns on arrival to unit that patient seems "more confused this afternoon than yesterday". Requested MD be notified.  Pt alert and oriented x 4 on admission assessment with nursing staff.Notified Dr. Wynetta Emery. Nursing to continue monitoring. Donavan Foil, RN

## 2018-03-01 NOTE — Progress Notes (Signed)
PROGRESS NOTE    FLEM ENDERLE  KWI:[id number]  DOB: 11-25-1931  DOA: 02/28/2018 PCP: Kathyrn Drown, MD   Brief Admission Hx: JMARI PELC is a 84 y.o. male with medical history significant of coronary artery disease, essential hypertension, hyperlipidemia, peripheral neuropathy, prostate cancer, sleep apnea on CPAP who is coming to the emergency department due to his family members and patient noticed in that he has been having difficulty with his memory, has been having trouble finding words and occasionally understanding was being spoken to him and overall seems to be slower than usual.  He was admitted with acute CVA.    MDM/Assessment & Plan:      Altered mental status Observation/telemetry. Frequent neuro checks. Given CT findings, will get stroke workup including MRI.Marland Kitchen Consider neurology evaluation  Active Problems:   Hyperlipidemia Continue atorvastatin 80 mg p.o. at bedtime.    OSA (obstructive sleep apnea) Continue nocturnal CPAP.    Essential hypertension, benign Not on antihypertensives at home. Allow permissive hypertension, pending MRI scan.    Coronary atherosclerosis of native coronary artery Denies any recent chest pain. Continue aspirin and atorvastatin. Nitroglycerin as needed.    Type 2 diabetes mellitus with diabetic neuropathy (HCC) Carbohydrate modified diet. Continue metformin 1000 mg p.o. daily. Continue pregabalin 100 mg p.o. in a.m. Continue pregabalin 200 mg p.o. at bedtime.    Anemia Check anemia panel.  Monitor hematocrit and hemoglobin   DVT prophylaxis: Lovenox SQ. Code Status: Full code. Family Communication: wife/daughter Disposition Plan:  Consults called:  Admission status: INP  Subjective: Pt says that he is feeling a little better, He is confused at times and thinks he is going to [facility] to see his son  Objective: Vitals:   03/01/18 1200 03/01/18 1244 03/01/18 1323 03/01/18 1403  BP: (!) 153/91   (!) 195/104   Pulse: 62 67  66  Resp: 15 19  18   Temp:    98 F (36.7 C)  TempSrc:    Oral  SpO2: 95% 97%  96%  Weight:   70.1 kg (154 lb 8.7 oz) 71.1 kg (156 lb 12 oz)  Height:   6\' 2"  (1.88 m) 6\' 2"  (1.88 m)    Intake/Output Summary (Last 24 hours) at 03/01/2018 1513 Last data filed at 03/01/2018 2426 Gross per 24 hour  Intake 1000 ml  Output 210 ml  Net 790 ml   Filed Weights   02/28/18 1453 03/01/18 1323 03/01/18 1403  Weight: 75.3 kg (166 lb) 70.1 kg (154 lb 8.7 oz) 71.1 kg (156 lb 12 oz)   REVIEW OF SYSTEMS  As per history otherwise all reviewed and reported negative  Exam:  General exam: awake, alert, confused.  Respiratory system: Clear. No increased work of breathing. Cardiovascular system: S1 & S2 heard, RRR. No JVD, murmurs, gallops, clicks or pedal edema. Gastrointestinal system: Abdomen is nondistended, soft and nontender. Normal bowel sounds heard. Central nervous system: Alert and oriented. No focal neurological deficits. Extremities: no CCE.  Data Reviewed: Basic Metabolic Panel: Recent Labs  Lab 02/28/18 1523  NA 142  K 3.5  CL 104  CO2 29  GLUCOSE 279*  BUN 24*  CREATININE 1.04  CALCIUM 9.2   Liver Function Tests: Recent Labs  Lab 02/28/18 1523  AST 22  ALT 23  ALKPHOS 61  BILITOT 0.5  PROT 6.8  ALBUMIN 3.7   Recent Labs  Lab 02/28/18 2240  LIPASE 26   No results for input(s): AMMONIA in the last 168 hours. CBC:  Recent Labs  Lab 02/28/18 1523  WBC 5.1  NEUTROABS 3.3  HGB 12.2*  HCT 36.6*  MCV 92.7  PLT 155   Cardiac Enzymes: Recent Labs  Lab 02/28/18 2240  TROPONINI <0.03   CBG (last 3)  Recent Labs    03/01/18 0805 03/01/18 1216  GLUCAP 135* 167*   No results found for this or any previous visit (from the past 240 hour(s)).   Studies: Dg Chest 2 View  Result Date: 02/28/2018 CLINICAL DATA:  Altered mental status and weakness. EXAM: CHEST - 2 VIEW COMPARISON:  Chest x-ray dated Apr 21, 2015. FINDINGS: The heart size and  mediastinal contours are within normal limits. Normal pulmonary vascularity. Atherosclerotic calcification of the aortic arch. No focal consolidation, pleural effusion, or pneumothorax. Bilateral nipple shadows again noted. No acute osseous abnormality. IMPRESSION: No active cardiopulmonary disease. Electronically Signed   By: Titus Dubin M.D.   On: 02/28/2018 15:12   Ct Head Wo Contrast  Result Date: 02/28/2018 CLINICAL DATA:  Altered level of consciousness. EXAM: CT HEAD WITHOUT CONTRAST TECHNIQUE: Contiguous axial images were obtained from the base of the skull through the vertex without intravenous contrast. COMPARISON:  None. FINDINGS: Brain: Mild diffuse cortical atrophy is noted. Mild chronic ischemic white matter disease is noted. No mass effect or midline shift is noted. Ventricular size is within normal limits. There is no evidence of mass lesion or hemorrhage. Right basal ganglia low density is noted consistent with infarction of indeterminate age. Vascular: No hyperdense vessel or unexpected calcification. Skull: Normal. Negative for fracture or focal lesion. Sinuses/Orbits: No acute finding. Other: None. IMPRESSION: Mild diffuse cortical atrophy. Mild chronic ischemic white matter disease. Right basal ganglia low density is noted consistent with infarction of indeterminate age; MRI may be performed for further evaluation. Electronically Signed   By: Marijo Conception, M.D.   On: 02/28/2018 21:51   Mr Brain Wo Contrast  Result Date: 03/01/2018 CLINICAL DATA:  TIA weakness prostate cancer EXAM: MRI HEAD WITHOUT CONTRAST TECHNIQUE: Multiplanar, multiecho pulse sequences of the brain and surrounding structures were obtained without intravenous contrast. COMPARISON:  CT head 02/28/2018 FINDINGS: Brain: Acute infarct in the right putamen and internal capsule measuring up to 15 mm. This corresponds to the hypodensity on CT. No other acute infarct. Mild atrophy without hydrocephalus. Moderate chronic  microvascular ischemic change in the white matter and pons. Negative for hemorrhage or mass lesion. No midline shift. Vascular: Normal arterial flow voids Skull and upper cervical spine: Negative Sinuses/Orbits: Prior surgery with medial antrostomy and ethmoidectomy. Mucosal edema throughout the paranasal sinuses. Bilateral cataract removal. Negative for orbital mass. Other: None IMPRESSION: Acute infarct right putamen and internal capsule fibers. Atrophy with moderate chronic microvascular ischemia. Electronically Signed   By: Franchot Gallo M.D.   On: 03/01/2018 08:04   US Carotid Bilateral (at [facility] And [facility] Only)  Result Date: 03/01/2018 CLINICAL DATA:  Altered mental status.  Blurry vision. EXAM: BILATERAL CAROTID DUPLEX ULTRASOUND TECHNIQUE: Pearline Cables scale imaging, color Doppler and duplex ultrasound were performed of bilateral carotid and vertebral arteries in the neck. COMPARISON:  None. FINDINGS: Criteria: Quantification of carotid stenosis is based on velocity parameters that correlate the residual internal carotid diameter with NASCET-based stenosis levels, using the diameter of the distal internal carotid lumen as the denominator for stenosis measurement. The following velocity measurements were obtained: RIGHT ICA:  94 cm/sec CCA:  979 cm/sec SYSTOLIC ICA/CCA RATIO:  0.7 DIASTOLIC ICA/CCA RATIO:  1.3 ECA:  85 cm/sec LEFT ICA:  78 cm/sec CCA:  81 cm/sec SYSTOLIC ICA/CCA RATIO:  1.0 DIASTOLIC ICA/CCA RATIO:  1.2 ECA:  82 cm/sec RIGHT CAROTID ARTERY: Mild irregular calcified plaque in the bulb. Low resistance internal carotid Doppler pattern is preserved. RIGHT VERTEBRAL ARTERY:  Antegrade. LEFT CAROTID ARTERY: Minimal calcified plaque along the wall of the bulb. Low resistance internal carotid Doppler pattern. LEFT VERTEBRAL ARTERY:  Antegrade. IMPRESSION: Less than 50% stenosis in the right and left internal carotid arteries. Electronically Signed   By: Marybelle Killings M.D.   On: 03/01/2018 10:09   Mr Jodene Nam  Head/brain EZ Cm  Result Date: 03/01/2018 CLINICAL DATA:  Altered mental status.  Prostate cancer. EXAM: MRA HEAD WITHOUT CONTRAST TECHNIQUE: Angiographic images of the Circle of Willis were obtained using MRA technique without intravenous contrast. COMPARISON:  CT head 02/28/2018 FINDINGS: Both vertebral arteries widely patent. Basilar widely patent. Left PICA patent. Right PICA not visualized. Posterior cerebral arteries patent bilaterally without stenosis Internal carotid artery widely patent bilaterally. Symmetric decreased signal in the M1 segment bilaterally is felt to be artifact due to tortuosity. Anterior middle cerebral arteries otherwise widely patent. No stenosis or aneurysm IMPRESSION: Negative Electronically Signed   By: Franchot Gallo M.D.   On: 03/01/2018 08:01   Scheduled Meds: .  stroke: mapping our early stages of recovery book   Does not apply Once  . aspirin  81 mg Oral Daily  . atorvastatin  80 mg Oral q1800  . calcium-vitamin D  1 tablet Oral Daily  . enoxaparin (LOVENOX) injection  40 mg Subcutaneous Q24H  . insulin aspart  0-15 Units Subcutaneous TID WC  . loratadine  10 mg Oral Daily  . [START ON 03/02/2018] metFORMIN  1,000 mg Oral Q breakfast  . multivitamin with minerals  1 tablet Oral Daily  . nortriptyline  30 mg Oral QHS  . pregabalin  100 mg Oral Daily  . pregabalin  200 mg Oral QHS   Continuous Infusions:  Principal Problem:   Altered mental status Active Problems:   Hyperlipidemia   OSA (obstructive sleep apnea)   Essential hypertension, benign   Coronary atherosclerosis of native coronary artery   Type 2 diabetes mellitus with diabetic neuropathy (HCC)   Anemia  Time spent:   Irwin Brakeman, MD, FAAFP [facility] Pager [phone]  If 7PM-7AM, please contact night-coverage [email or web address] Password [email or web address] 03/01/2018, 3:13 PM    LOS: 0 days

## 2018-03-01 NOTE — Evaluation (Addendum)
Physical Therapy Evaluation Patient Details Name: Luke Solis MRN: [id number] DOB: [birth year] Today's Date: 03/01/2018   History of Present Illness  Luke Solis is a 84 y/o male having difficulty with memory, word finding problems, understanding when bening spoken to, and overall slower than normal    Clinical Impression  Patient functioning near baseline for functional mobility and gait, other than demonstrating slightly labored movement for transfers and ambulation in hallway with no loss of balance, requires occasional repeated verbal cues for safety during transfers.  Patient and spouse can stay on main level at home and encouraged to have patient sleep on low bed and have supervision when attempting stairs.  Patient will benefit from continued physical therapy in hospital and recommended venue below to increase strength, balance, endurance for safe ADLs and gait.    Follow Up Recommendations Home health PT;Supervision for mobility/OOB    Equipment Recommendations  None recommended by PT    Recommendations for Other Services       Precautions / Restrictions Precautions Precautions: Fall Restrictions Weight Bearing Restrictions: No      Mobility  Bed Mobility Overal bed mobility: Needs Assistance Bed Mobility: Supine to Sit;Sit to Supine     Supine to sit: Supervision Sit to supine: Supervision      Transfers Overall transfer level: Needs assistance Equipment used: None Transfers: Sit to/from [employer] Transfers Sit to Stand: Supervision Stand pivot transfers: Supervision       General transfer comment: slightly labored movement  Ambulation/Gait Ambulation/Gait assistance: Supervision Ambulation Distance (Feet): 120 Feet Assistive device: None Gait Pattern/deviations: Decreased step length - right;Decreased step length - left;Decreased stride length   Gait velocity interpretation: at or above normal speed for age/gender General Gait Details:  slightly labored cadenc with normal speed of cadence, no loss of balance  Stairs            Wheelchair Mobility    Modified Rankin (Stroke Patients Only)       Balance Overall balance assessment: Mild deficits observed, not formally tested                                           Pertinent Vitals/Pain Pain Assessment: No/denies pain    Home Living Family/patient expects to be discharged to:: Private residence Living Arrangements: Spouse/significant other Available Help at Discharge: Family;Available 24 hours/day Type of Home: House Home Access: Stairs to enter Entrance Stairs-Rails: Right Entrance Stairs-Number of Steps: 4 Home Layout: Multi-level Home Equipment: Other (comment) Additional Comments: (has 2 walking sticks)    Prior Function Level of Independence: Independent         Comments: Ambulates community distances, drives     Hand Dominance   Dominant Hand: Right    Extremity/Trunk Assessment   Upper Extremity Assessment Upper Extremity Assessment: Defer to OT evaluation    Lower Extremity Assessment Lower Extremity Assessment: Generalized weakness;RLE deficits/detail;LLE deficits/detail RLE Deficits / Details: grossly -5/5 LLE Deficits / Details: grossly 4/5    Cervical / Trunk Assessment Cervical / Trunk Assessment: Normal  Communication   Communication: No difficulties  Cognition Arousal/Alertness: Awake/alert Behavior During Therapy: WFL for tasks assessed/performed Overall Cognitive Status: Within Functional Limits for tasks assessed  General Comments      Exercises     Assessment/Plan    PT Assessment Patient needs continued PT services  PT Problem List Decreased strength;Decreased activity tolerance;Decreased balance;Decreased mobility       PT Treatment Interventions Gait training;Stair training;Functional mobility training;Therapeutic  activities;Therapeutic exercise;Patient/family education    PT Goals (Current goals can be found in the Care Plan section)  Acute Rehab PT Goals Patient Stated Goal: return home  PT Goal Formulation: With patient/family Time For Goal Achievement: 03/04/18 Potential to Achieve Goals: Good    Frequency 7X/week   Barriers to discharge        Co-evaluation               AM-PAC PT "6 Clicks" Daily Activity  Outcome Measure Difficulty turning over in bed (including adjusting bedclothes, sheets and blankets)?: None Difficulty moving from lying on back to sitting on the side of the bed? : None Difficulty sitting down on and standing up from a chair with arms (e.g., wheelchair, bedside commode, etc,.)?: None Help needed moving to and from a bed to chair (including a wheelchair)?: A Little Help needed walking in hospital room?: A Little Help needed climbing 3-5 steps with a railing? : A Little 6 Click Score: 21    End of Session   Activity Tolerance: Patient tolerated treatment well Patient left: in bed Nurse Communication: Mobility status PT Visit Diagnosis: Unsteadiness on feet (R26.81);Other abnormalities of gait and mobility (R26.89);Muscle weakness (generalized) (M62.81)    Time: 1014-1040 PT Time Calculation (min) (ACUTE ONLY): 26 min   Charges:   PT Evaluation $PT Eval Moderate Complexity: 1 Mod PT Treatments $Therapeutic Activity: 23-37 mins   PT G Codes:        12:33 PM, 2018/03/18 Lonell Grandchild, MPT Physical Therapist with [facility] [phone] office [phone] mobile phone

## 2018-03-01 NOTE — Progress Notes (Signed)
*  PRELIMINARY RESULTS* Echocardiogram 2D Echocardiogram has been performed.  Samuel Germany 03/01/2018, 2:08 PM

## 2018-03-01 NOTE — ED Notes (Signed)
Patient keeps removing all cords and wires from himself and up walking around in room.

## 2018-03-01 NOTE — ED Notes (Signed)
Patient transported to MRI 

## 2018-03-02 ENCOUNTER — Telehealth: Payer: Self-pay | Admitting: Family Medicine

## 2018-03-02 ENCOUNTER — Telehealth: Payer: Self-pay | Admitting: *Deleted

## 2018-03-02 DIAGNOSIS — I498 Other specified cardiac arrhythmias: Secondary | ICD-10-CM

## 2018-03-02 DIAGNOSIS — R4182 Altered mental status, unspecified: Secondary | ICD-10-CM | POA: Diagnosis not present

## 2018-03-02 DIAGNOSIS — G4733 Obstructive sleep apnea (adult) (pediatric): Secondary | ICD-10-CM

## 2018-03-02 LAB — GLUCOSE, CAPILLARY
GLUCOSE-CAPILLARY: 172 mg/dL — AB (ref 65–99)
Glucose-Capillary: 134 mg/dL — ABNORMAL HIGH (ref 65–99)
Glucose-Capillary: 113 mg/dL — ABNORMAL HIGH (ref 65–99)
Glucose-Capillary: 247 mg/dL — ABNORMAL HIGH (ref 65–99)

## 2018-03-02 LAB — LIPID PANEL
Cholesterol: 144 mg/dL (ref 0–200)
HDL: 51 mg/dL
LDL Cholesterol: 80 mg/dL (ref 0–99)
Total CHOL/HDL Ratio: 2.8 ratio
Triglycerides: 67 mg/dL
VLDL: 13 mg/dL (ref 0–40)

## 2018-03-02 MED ORDER — METFORMIN HCL 500 MG PO TABS: 1000.0000 mg | ORAL_TABLET | Freq: Two times a day (BID) | ORAL | 0 refills | Status: DC

## 2018-03-02 MED ORDER — AMLODIPINE BESYLATE 5 MG PO TABS
5.0000 mg | ORAL_TABLET | Freq: Every day | ORAL | Status: DC
  Administered 2018-03-02 – 2018-03-03 (×2): 5 mg via ORAL
  Filled 2018-03-02 (×2): qty 1

## 2018-03-02 MED ORDER — ASPIRIN 81 MG PO CHEW
324.0000 mg | CHEWABLE_TABLET | Freq: Every day | ORAL | Status: DC
  Administered 2018-03-02 – 2018-03-03 (×2): 324 mg via ORAL
  Filled 2018-03-02 (×2): qty 4

## 2018-03-02 MED ORDER — AMLODIPINE BESYLATE 5 MG PO TABS: 5.0000 mg | ORAL_TABLET | Freq: Every day | ORAL | 0 refills | Status: DC

## 2018-03-02 MED ORDER — ASPIRIN EC 325 MG PO TBEC: 325.0000 mg | DELAYED_RELEASE_TABLET | Freq: Every day | ORAL | 3 refills | Status: AC

## 2018-03-02 NOTE — Telephone Encounter (Signed)
Nurse's-patient recently discharged from the hospital. Please call patient, let them know that we are aware that they were discharged from the hospital. Please schedule them to follow-up with [place] within the next 7 days. Advised the patient to bring all of their medications with him to the visit. Please inquire if they are having any acute issues currently and documented accordingly.  With this particular patient I will be doing a home visit next week.  Please let the family know that we will call them early next week to set up for with date next week to do a home visit-thank you

## 2018-03-02 NOTE — Consult Note (Signed)
[place] A. Merlene Laughter, MD     [email or web address]          Luke Solis is an 84 y.o. male.   ASSESSMENT/PLAN: 1. Cryptogenic right basal ganglia infarct:  Risk factors hypertension, diabetes, dyslipidemia, obstructive sleep apnea syndrome and coronary artery disease.  The patient aspirin will be increased to 325. A 30 day event monitor is recommended.  Continue with current statin medication.    The patient 84 year old right-handed white male who presents with the acute onset of word-finding difficulties and dysarthria about 4 days ago.  The wife reports that he is highly functional at baseline.  He drives, talks ambulates and takes care of most of the activities around the house.  At the onset of the symptoms on Sunday, the patient was also quite sleepy.  He contacted his primary care provider who saw him on Tuesday and referred him to the hospital for further evaluation.  Thrombolysis was not considered because of delay in presentation.  The patient has been on aspirin 81 mg and has been compliant with this.  Wife reports that he did not have clear focal weakness but his balance was off.  He does have a history of neuropathy with numbness of the feet.  No clear focal numbness and weakness however.  No new chest pain or shortness of breath.  The review of systems otherwise negative.    GENERAL:   This is a pleasant thin male in no acute distress.  HEENT:   This is normal.  ABDOMEN: soft  EXTREMITIES: No edema   BACK:  This is normal.  SKIN: Normal by inspection.    MENTAL STATUS: Alert and oriented -   including orientation to his age and the month. Speech is mild to moderately dysarthric , language and cognition are generally intact.  The patient was able to name all 6 objects tested.  He does seem to have some delay however.  This delay and responding and also in in comprehending. Judgment and insight normal.   CRANIAL NERVES: Pupils are equal, round and  reactive to light and accomodation; extra ocular movements are full, there is no significant nystagmus; visual fields are full; upper and lower facial muscles are normal in strength and symmetric, there is no flattening of the nasolabial folds; tongue is midline; uvula is midline; shoulder elevation is normal.  MOTOR: Normal tone, bulk and strength; no pronator drift.  COORDINATION: Left finger to nose is normal, right finger to nose is normal, No rest tremor; no intention tremor; no postural tremor; no bradykinesia.  REFLEXES: Deep tendon reflexes are symmetrical and normal.    SENSATION: Normal to light touch, temperature, and pain.  He does not extinguish to double simultaneous stimulation.  GAIT: He does ambulate without assistive devices although gait is slightly unsteady.     NIH stroke scale 1.   Blood pressure (!) 171/85, pulse 84, temperature 98.4 F (36.9 C), temperature source Oral, resp. rate 18, height _0  (1.88 m), weight 156 lb 12 oz (71.1 kg), SpO2 93 %.  Past Medical History:  Diagnosis Date  . Coronary atherosclerosis of native coronary artery    BMS proximal and mid RCA as well as BMS circumflex - 2002 (had residual 70% distal LAD), LVEF 60%  . Essential hypertension, benign   . Hyperlipidemia   . Neuropathy   . NSTEMI (non-ST elevated myocardial infarction) ([place])    2002  . Prostate cancer ([place])   . Sleep apnea    On  CPAP    Past Surgical History:  Procedure Laterality Date  . COLONOSCOPY    . INGUINAL HERNIA REPAIR    . KNEE SURGERY    . NOSE SURGERY    . PROSTATE SURGERY    . TONSILLECTOMY      Family History  Problem Relation Age of Onset  . Heart attack Mother   . Diabetes Mother   . Coronary artery disease Father   . Hypertension Father   . Heart attack Father   . Lung disease Neg Hx   . Cancer Neg Hx     Social History:  reports that he has never smoked. He has never used smokeless tobacco. He reports that he does not drink alcohol or  use drugs.  Allergies:  Allergies  Allergen Reactions  . Broccoli [Brassica Oleracea Italica] Nausea And Vomiting  . Celery Oil Nausea And Vomiting    Medications: Prior to Admission medications   Medication Sig Start Date End Date Taking? Authorizing Provider  aspirin 81 MG tablet Take 81 mg by mouth daily.   Yes [provider]  atorvastatin (LIPITOR) 80 MG tablet TAKE (1) TABLET BY MOUTH ONCE DAILY. 11/28/17  Yes Kathyrn Drown, MD  Calcium Carbonate-Vitamin D 600-400 MG-UNIT per tablet Take 1 tablet by mouth daily.   Yes [provider]  fexofenadine (ALLEGRA) 180 MG tablet Take 180 mg by mouth as needed.   Yes [provider]  metFORMIN (GLUCOPHAGE) 500 MG tablet TAKE 2 TABLETS ONCE DAILY WITH FOOD. 01/09/18  Yes [name], [name], MD  Multiple Vitamin (MULTIVITAMIN) tablet Take 1 tablet by mouth daily.   Yes [provider]  nitroGLYCERIN (NITROSTAT) 0.4 MG SL tablet Place 1 tablet (0.4 mg total) under the tongue every 5 (five) minutes as needed for chest pain. 07/22/17 11/01/20 Yes Kathyrn Drown, MD  nortriptyline (PAMELOR) 10 MG capsule 3 daily at bedtime Patient taking differently: Take 30 mg by mouth at bedtime. 3 daily at bedtime 07/22/17  Yes [name], Scott A, MD  pregabalin (LYRICA) 100 MG capsule TAKE 1 CAPSULE IN THE MORNING AND 2 CAPSULES AT BEDTIME. 09/23/17  Yes Kathyrn Drown, MD  FORACARE PREMIUM V10 TEST test strip USE AS DIRECTED. 07/20/17   Mikey Kirschner, MD    Scheduled Meds: .  stroke: mapping our early stages of recovery book   Does not apply Once  . aspirin  81 mg Oral Daily  . atorvastatin  80 mg Oral q1800  . calcium-vitamin D  1 tablet Oral Daily  . enoxaparin (LOVENOX) injection  40 mg Subcutaneous Q24H  . insulin aspart  0-15 Units Subcutaneous TID WC  . loratadine  10 mg Oral Daily  . metFORMIN  1,000 mg Oral Q breakfast  . multivitamin with minerals  1 tablet Oral Daily  . nortriptyline  30 mg Oral QHS  .  pregabalin  100 mg Oral Daily  . pregabalin  200 mg Oral QHS   Continuous Infusions: PRN Meds:.acetaminophen **OR** acetaminophen (TYLENOL) oral liquid 160 mg/5 mL **OR** acetaminophen, acetaminophen **OR** acetaminophen, nitroGLYCERIN, ondansetron **OR** ondansetron (ZOFRAN) IV, senna-docusate     Results for orders placed or performed during the hospital encounter of 02/28/18 (from the past 48 hour(s))  Urinalysis, Routine w reflex microscopic     Status: Abnormal   Collection Time: 02/28/18  2:58 PM  Result Value Ref Range   Color, Urine YELLOW YELLOW   APPearance CLEAR CLEAR   Specific Gravity, Urine 1.027 1.005 - 1.030  pH 5.0 5.0 - 8.0   Glucose, UA >=500 (A) NEGATIVE mg/dL   Hgb urine dipstick NEGATIVE NEGATIVE   Bilirubin Urine NEGATIVE NEGATIVE   Ketones, ur NEGATIVE NEGATIVE mg/dL   Protein, ur 30 (A) NEGATIVE mg/dL   Nitrite NEGATIVE NEGATIVE   Leukocytes, UA NEGATIVE NEGATIVE   RBC / HPF 0-5 0 - 5 RBC/hpf   WBC, UA 0-5 0 - 5 WBC/hpf   Bacteria, UA NONE SEEN NONE SEEN   Squamous Epithelial / LPF NONE SEEN NONE SEEN    Comment: Performed at [facility], [address], [place], [place] [address]  CBC with Differential     Status: Abnormal   Collection Time: 02/28/18  3:23 PM  Result Value Ref Range   WBC 5.1 4.0 - 10.5 K/uL   RBC 3.95 (L) 4.22 - 5.81 MIL/uL   Hemoglobin 12.2 (L) 13.0 - 17.0 g/dL   HCT 36.6 (L) 39.0 - 52.0 %   MCV 92.7 78.0 - 100.0 fL   MCH 30.9 26.0 - 34.0 pg   MCHC 33.3 30.0 - 36.0 g/dL   RDW 12.4 11.5 - 15.5 %   Platelets 155 150 - 400 K/uL   Neutrophils Relative % 64 %   Neutro Abs 3.3 1.7 - 7.7 K/uL   Lymphocytes Relative 22 %   Lymphs Abs 1.1 0.7 - 4.0 K/uL   Monocytes Relative 8 %   Monocytes Absolute 0.4 0.1 - 1.0 K/uL   Eosinophils Relative 6 %   Eosinophils Absolute 0.3 0.0 - 0.7 K/uL   Basophils Relative 0 %   Basophils Absolute 0.0 0.0 - 0.1 K/uL    Comment: Performed at [facility], [address], [place], [place] [address]    Comprehensive metabolic panel     Status: Abnormal   Collection Time: 02/28/18  3:23 PM  Result Value Ref Range   Sodium 142 135 - 145 mmol/L   Potassium 3.5 3.5 - 5.1 mmol/L   Chloride 104 101 - 111 mmol/L   CO2 29 22 - 32 mmol/L   Glucose, Bld 279 (H) 65 - 99 mg/dL   BUN 24 (H) 6 - 20 mg/dL   Creatinine, Ser 1.04 0.61 - 1.24 mg/dL   Calcium 9.2 8.9 - 10.3 mg/dL   Total Protein 6.8 6.5 - 8.1 g/dL   Albumin 3.7 3.5 - 5.0 g/dL   AST 22 15 - 41 U/L   ALT 23 17 - 63 U/L   Alkaline Phosphatase 61 38 - 126 U/L   Total Bilirubin 0.5 0.3 - 1.2 mg/dL   GFR calc non Af Amer >60 >60 mL/min   GFR calc Af Amer >60 >60 mL/min    Comment: (NOTE) The eGFR has been calculated using the CKD EPI equation. This calculation has not been validated in all clinical situations. eGFR's persistently <60 mL/min signify possible Chronic Kidney Disease.    Anion gap 9 5 - 15    Comment: Performed at [facility], [address] [place], [place] [address]  Lipase, blood     Status: None   Collection Time: 02/28/18 10:40 PM  Result Value Ref Range   Lipase 26 11 - 51 U/L    Comment: Performed at [facility], [address], [place], [place] [address]  Troponin I     Status: None   Collection Time: 02/28/18 10:40 PM  Result Value Ref Range   Troponin I <0.03 <0.03 ng/mL    Comment: Performed at [facility], [address] [place], [place]  [address]  Hemoglobin A1c     Status: Abnormal   Collection Time: 03/01/18  6:00 AM  Result Value Ref Range   Hgb A1c MFr Bld 7.2 (H) 4.8 - 5.6 %    Comment: (NOTE) Pre diabetes:          5.7%-6.4% Diabetes:              >6.4% Glycemic control for   <7.0% adults with diabetes    Mean Plasma Glucose 159.94 mg/dL    Comment: Performed at [place] [address], [place], [place] [address]  Lipid panel     Status: None   Collection Time: 03/01/18  6:00 AM  Result Value Ref Range   Cholesterol 151 0 - 200 mg/dL   Triglycerides 83 <150 mg/dL   HDL 48  >40 mg/dL   Total CHOL/HDL Ratio 3.1 RATIO   VLDL 17 0 - 40 mg/dL   LDL Cholesterol 86 0 - 99 mg/dL    Comment:        Total Cholesterol/HDL:CHD Risk Coronary Heart Disease Risk Table                     Men   Women  1/2 Average Risk   3.4   3.3  Average Risk       5.0   4.4  2 X Average Risk   9.6   7.1  3 X Average Risk  23.4   11.0        Use the calculated Patient Ratio above and the CHD Risk Table to determine the patient's CHD Risk.        ATP III CLASSIFICATION (LDL):  <100     mg/dL   Optimal  100-129  mg/dL   Near or Above                    Optimal  130-159  mg/dL   Borderline  160-189  mg/dL   High  >190     mg/dL   Very High Performed at [facility], [address] [place], [place] [address]   Vitamin B12     Status: None   Collection Time: 03/01/18  6:00 AM  Result Value Ref Range   Vitamin B-12 405 180 - 914 pg/mL    Comment: (NOTE) This assay is not validated for testing neonatal or myeloproliferative syndrome specimens for Vitamin B12 levels. Performed at [facility], [place] [address], [place], [place] [address]   Folate     Status: None   Collection Time: 03/01/18  6:00 AM  Result Value Ref Range   Folate 29.0 >5.9 ng/mL    Comment: Performed at [place] [address], [place], [place] [address]  Iron and TIBC     Status: None   Collection Time: 03/01/18  6:00 AM  Result Value Ref Range   Iron 85 45 - 182 ug/dL   TIBC 290 250 - 450 ug/dL   Saturation Ratios 29 17.9 - 39.5 %   UIBC 205 ug/dL    Comment: Performed at [facility] [address] [place], [place] [address]  Ferritin     Status: None   Collection Time: 03/01/18  6:00 AM  Result Value Ref Range   Ferritin 33 24 - 336 ng/mL    Comment: Performed at [place] [address] [place], [place] [address]  Reticulocytes     Status: None  Collection Time: 03/01/18  6:00 AM  Result Value Ref Range   Retic Ct Pct 1.5 0.4 - 3.1 %   RBC. 4.44 4.22 - 5.81  MIL/uL   Retic Count, Absolute 66.6 19.0 - 186.0 K/uL    Comment: Performed at [facility], [address] [place], [place] [address]  CBG monitoring, ED     Status: Abnormal   Collection Time: 03/01/18  8:05 AM  Result Value Ref Range   Glucose-Capillary 135 (H) 65 - 99 mg/dL  CBG monitoring, ED     Status: Abnormal   Collection Time: 03/01/18 12:16 PM  Result Value Ref Range   Glucose-Capillary 167 (H) 65 - 99 mg/dL  Glucose, capillary     Status: Abnormal   Collection Time: 03/01/18  5:18 PM  Result Value Ref Range   Glucose-Capillary 146 (H) 65 - 99 mg/dL   Comment 1 Notify RN    Comment 2 Document in Chart   Glucose, capillary     Status: Abnormal   Collection Time: 03/01/18  8:39 PM  Result Value Ref Range   Glucose-Capillary 167 (H) 65 - 99 mg/dL   Comment 1 Notify RN    Comment 2 Document in Chart   Lipid panel     Status: None   Collection Time: 03/02/18  5:43 AM  Result Value Ref Range   Cholesterol 144 0 - 200 mg/dL   Triglycerides 67 <150 mg/dL   HDL 51 >40 mg/dL   Total CHOL/HDL Ratio 2.8 RATIO   VLDL 13 0 - 40 mg/dL   LDL Cholesterol 80 0 - 99 mg/dL    Comment:        Total Cholesterol/HDL:CHD Risk Coronary Heart Disease Risk Table                     Men   Women  1/2 Average Risk   3.4   3.3  Average Risk       5.0   4.4  2 X Average Risk   9.6   7.1  3 X Average Risk  23.4   11.0        Use the calculated Patient Ratio above and the CHD Risk Table to determine the patient's CHD Risk.        ATP III CLASSIFICATION (LDL):  <100     mg/dL   Optimal  100-129  mg/dL   Near or Above                    Optimal  130-159  mg/dL   Borderline  160-189  mg/dL   High  >190     mg/dL   Very High Performed at [place] [place], [place] [address]     Studies/Results:  TTE - Left ventricle: The cavity size was normal. Wall thickness was   increased in a pattern of moderate LVH. Systolic function was   normal. The estimated ejection  fraction was in the range of 60%   to 65%. Wall motion was normal; there were no regional wall   motion abnormalities. Doppler parameters are consistent with   abnormal left ventricular relaxation (grade 1 diastolic   dysfunction). - Aortic valve: Mildly calcified annulus. Trileaflet; mildly   thickened leaflets. Valve area (VTI): 2.98 cm^2. Valve area   (Vmax): 3.12 cm^2. Valve area (Vmean): 2.63 cm^2. - Mitral valve: Mildly calcified annulus. Mildly thickened leaflets    CAROTID DOPPLERS IMPRESSION: Less than 50% stenosis in  the right and left internal carotid arteries.    BRAIN MRA FINDINGS: Both vertebral arteries widely patent. Basilar widely patent. Left PICA patent. Right PICA not visualized. Posterior cerebral arteries patent bilaterally without stenosis  Internal carotid artery widely patent bilaterally. Symmetric decreased signal in the M1 segment bilaterally is felt to be artifact due to tortuosity. Anterior middle cerebral arteries otherwise widely patent. No stenosis or aneurysm  IMPRESSION: Negative   BRAIN MRI FINDINGS: Brain: Acute infarct in the right putamen and internal capsule measuring up to 15 mm. This corresponds to the hypodensity on CT. No other acute infarct.  Mild atrophy without hydrocephalus. Moderate chronic microvascular ischemic change in the white matter and pons. Negative for hemorrhage or mass lesion. No midline shift.  Vascular: Normal arterial flow voids  Skull and upper cervical spine: Negative  Sinuses/Orbits: Prior surgery with medial antrostomy and ethmoidectomy. Mucosal edema throughout the paranasal sinuses. Bilateral cataract removal. Negative for orbital mass.  Other: None  IMPRESSION: Acute infarct right putamen and internal capsule fibers.  Atrophy with moderate chronic microvascular ischemia.      The brain MRI MRA are reviewed in person.  No occlusive disease is seen on the MRA.  MRI shows  increased signal on DWI involving the right basal ganglia.  This is a moderate size infarct in seems bigger than a lacunar event.  If the anterior limb of the internal capsule seems also involved.  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  No hemorrhages appreciated.        Jubilee Vivero A. Merlene Laughter, M.D.  Diplomate, Tax adviser of Psychiatry and Neurology ( Neurology). 03/02/2018, 7:42 AM

## 2018-03-02 NOTE — Telephone Encounter (Signed)
-----   Message from Murlean Iba, MD sent at 03/02/2018  9:50 AM EDT ----- Regarding: 30 day event monitor Please send patient 30 day event monitor as part of his work up for acute stroke.   Thank you.

## 2018-03-02 NOTE — Discharge Summary (Signed)
Physician Discharge Summary  Luke Solis CHE:[id number] DOB: 10-14-1931 DOA: 02/28/2018  PCP: Kathyrn Drown, MD Neurologist  Admit date: 02/28/2018 Discharge date: 03/02/2018  Admitted From: Home  Disposition:  Home with [place]  Recommendations for Outpatient Follow-up:  1. Follow up with PCP in 1 weeks 2. Please obtain BMP/CBC in one week 3. Please work with patient to further normalize blood pressure.  4. Please work with patient to improve glycemic control.    Cardiology will send you a 30 day event monitor to your home for you to wear.   Please see your primary care provider in 1 week for follow up.  Please see your neurologist in 1 month for stroke follow up.   Physical therapy will be arranged to come to your home for therapy.  Please work with your primary care provider to improve your blood pressure.    Home Health: Physical Therapy   Discharge Condition: STABLE   CODE STATUS: FULL    Brief Hospitalization Summary: Please see all hospital notes, images, labs for full details of the hospitalization. HPI: GEROGE Solis is a 84 y.o. male with medical history significant of coronary artery disease, essential hypertension, hyperlipidemia, peripheral neuropathy, prostate cancer, sleep apnea on CPAP who is coming to the emergency department due to his family members and patient noticed in that he has been having difficulty with his memory, has been having trouble finding words and occasionally understanding was being spoken to him and overall seems to be slower than usual.  He denies dizziness, headache, blurred vision, slurred speech or gait imbalance.  No fever, chills, sore throat, rhinorrhea, chest pain, dizziness, diaphoresis, dyspnea, PND, orthopnea or pitting edema of the lower extremities.  Denies abdominal pain, nausea, emesis, diarrhea, constipation, melena or hematochezia.  No dysuria or hematuria.  No polyphagia, polydipsia or polyuria.  Denies skin rashes or  pruritus.  ED Course: Initial vital signs temperature 97.57F, pulse 60, respirations 18, blood pressure 163/75 mmHg and O2 sat 98% on room air.  Workup shows an UA with glucosuria of 500 mg/dL, proteinuria 30 mg/dL, the rest of the urinalysis is within normal limits.  White count was 5.1 with a normal differential, hemoglobin 12.2 g/dL and platelets 155.  CMP shows a glucose level of 279 and BUN of 24 mg/dL.  All other chemistry values are normal. EKG shows sinus rhythm with first-degree AV block, low QRS voltage, anterior and inferior infarct.  Imaging: CT of the head with contrast showed mild diffuse cortical atrophy.  Mild chronic ischemic white matter disease.  Right basal ganglia low-density is noted consistent with infarction of undetermined age.  MRI was ordered.     Brief Admission Hx: Luke Solis a 84 y.o.malewith medical history significant ofcoronary artery disease, essential hypertension, hyperlipidemia, peripheral neuropathy, prostate cancer, sleep apnea on CPAP who is coming to the emergency department due to his family members and patient noticed in thathe has been having difficulty with his memory, has been having trouble finding words and occasionally understanding was being spoken to him and overall seems to be slower than usual.  He was admitted with acute CVA.    MDM/Assessment & Plan:   Cryptogenic right basal ganglia infarct: The patient was seen by neurologist Dr. Merlene Laughter and he reported that the Risk factors for his CVA included hypertension, diabetes, dyslipidemia, obstructive sleep apnea syndrome and coronary artery disease.  The patient's aspirin will be increased to 325 mg daily. A 30 day event monitor was recommended  and has been arranged prior to discharge.  Continue with current statin medication.  Home health PT recommended.  Follow up with PCP and neurologist outpatient.   His lipids are optimally controlled.  His diabetes is suboptimally controlled as  evidenced by an A1c of 7.2%.  His blood pressure is suboptimally controlled and will need to be further addressed outpatient with PCP.    Hyperlipidemia Continue atorvastatin 80 mg p.o. at bedtime.  OSA (obstructive sleep apnea) Continue nocturnal CPAP.  Essential hypertension, benign Not on antihypertensives at home. Allowed permissive hypertension, but discharging on amlodipine 5 mg daily and follow up with PCP to have doses titrated for better blood pressure control.  Goal to normalize blood pressure over next 7-10 days.   Coronary atherosclerosis of native coronary artery Denies any recent chest pain. Continue aspirin and atorvastatin. Nitroglycerin as needed.  Type 2 diabetes mellitus with diabetic neuropathy (HCC) Carbohydrate modified diet. Increased metformin 1000 mg p.o. BID for A1c suboptimally controlled at 7.2%.  Continue pregabalin 100 mg p.o. in a.m. Continue pregabalin 200 mg p.o. at bedtime.  Anemia Anemia panel was within normal limits.  Follow up with PCP.  DVT prophylaxis:Lovenox SQ. Code Status:Full code. Family Communication:wife/daughter Disposition Plan:Home with HHPT Consults called:Neurology  Discharge Diagnoses:  Principal Problem:   Altered mental status Active Problems:   Hyperlipidemia   OSA (obstructive sleep apnea)   Essential hypertension, benign   Coronary atherosclerosis of native coronary artery   Type 2 diabetes mellitus with diabetic neuropathy (HCC)   Anemia  Discharge Instructions: Discharge Instructions    Call MD for:  difficulty breathing, headache or visual disturbances   Complete by:  As directed    Call MD for:  persistant dizziness or light-headedness   Complete by:  As directed    Call MD for:  persistant nausea and vomiting   Complete by:  As directed    Call MD for:  severe uncontrolled pain   Complete by:  As directed    Increase activity slowly   Complete by:  As directed      Allergies as of  03/02/2018      Reactions   Broccoli [brassica Oleracea Italica] Nausea And Vomiting   Celery Oil Nausea And Vomiting      Medication List    STOP taking these medications   aspirin 81 MG tablet Replaced by:  aspirin EC 325 MG tablet     TAKE these medications   amLODipine 5 MG tablet Commonly known as:  NORVASC Take 1 tablet (5 mg total) by mouth daily. Start taking on:  03/03/2018   aspirin EC 325 MG tablet Take 1 tablet (325 mg total) by mouth daily. Replaces:  aspirin 81 MG tablet   atorvastatin 80 MG tablet Commonly known as:  LIPITOR TAKE (1) TABLET BY MOUTH ONCE DAILY.   Calcium Carbonate-Vitamin D 600-400 MG-UNIT tablet Take 1 tablet by mouth daily.   fexofenadine 180 MG tablet Commonly known as:  ALLEGRA Take 180 mg by mouth as needed.   FORACARE PREMIUM V10 TEST test strip Generic drug:  glucose blood USE AS DIRECTED.   metFORMIN 500 MG tablet Commonly known as:  GLUCOPHAGE Take 2 tablets (1,000 mg total) by mouth 2 (two) times daily with a meal. TAKE 2 TABLETS ONCE DAILY WITH FOOD. What changed:    how much to take  how to take this  when to take this   multivitamin tablet Take 1 tablet by mouth daily.   nitroGLYCERIN 0.4 MG SL  tablet Commonly known as:  NITROSTAT Place 1 tablet (0.4 mg total) under the tongue every 5 (five) minutes as needed for chest pain.   nortriptyline 10 MG capsule Commonly known as:  PAMELOR 3 daily at bedtime What changed:    how much to take  how to take this  when to take this  additional instructions   pregabalin 100 MG capsule Commonly known as:  LYRICA TAKE 1 CAPSULE IN THE MORNING AND 2 CAPSULES AT BEDTIME.            Durable Medical Equipment  (From admission, onward)        Start     Ordered   03/02/18 1159  For home use only DME Shower stool  Once     03/02/18 1158     Follow-up Information    [name], [name], MD. Schedule an appointment as soon as possible for a visit in 1 week(s).    Specialty:  Family Medicine Why:  Hospital Follow Up  Contact information: [place] [address] [phone]        Phillips Odor, MD. Schedule an appointment as soon as possible for a visit in 1 month(s).   Specialty:  Neurology Why:  [facility] Follow up Stroke Contact information: [address] [place] [address] [phone]          Allergies  Allergen Reactions  . Broccoli [Brassica Oleracea Italica] Nausea And Vomiting  . Celery Oil Nausea And Vomiting   Allergies as of 03/02/2018      Reactions   Broccoli [brassica Oleracea Italica] Nausea And Vomiting   Celery Oil Nausea And Vomiting      Medication List    STOP taking these medications   aspirin 81 MG tablet Replaced by:  aspirin EC 325 MG tablet     TAKE these medications   amLODipine 5 MG tablet Commonly known as:  NORVASC Take 1 tablet (5 mg total) by mouth daily. Start taking on:  03/03/2018   aspirin EC 325 MG tablet Take 1 tablet (325 mg total) by mouth daily. Replaces:  aspirin 81 MG tablet   atorvastatin 80 MG tablet Commonly known as:  LIPITOR TAKE (1) TABLET BY MOUTH ONCE DAILY.   Calcium Carbonate-Vitamin D 600-400 MG-UNIT tablet Take 1 tablet by mouth daily.   fexofenadine 180 MG tablet Commonly known as:  ALLEGRA Take 180 mg by mouth as needed.   FORACARE PREMIUM V10 TEST test strip Generic drug:  glucose blood USE AS DIRECTED.   metFORMIN 500 MG tablet Commonly known as:  GLUCOPHAGE Take 2 tablets (1,000 mg total) by mouth 2 (two) times daily with a meal. TAKE 2 TABLETS ONCE DAILY WITH FOOD. What changed:    how much to take  how to take this  when to take this   multivitamin tablet Take 1 tablet by mouth daily.   nitroGLYCERIN 0.4 MG SL tablet Commonly known as:  NITROSTAT Place 1 tablet (0.4 mg total) under the tongue every 5 (five) minutes as needed for chest pain.   nortriptyline 10 MG capsule Commonly known as:  PAMELOR 3  daily at bedtime What changed:    how much to take  how to take this  when to take this  additional instructions   pregabalin 100 MG capsule Commonly known as:  LYRICA TAKE 1 CAPSULE IN THE MORNING AND 2 CAPSULES AT BEDTIME.            Durable Medical Equipment  (  From admission, onward)        Start     Ordered   03/02/18 1159  For home use only DME Shower stool  Once     03/02/18 1158      Procedures/Studies: Dg Chest 2 View  Result Date: 02/28/2018 CLINICAL DATA:  Altered mental status and weakness. EXAM: CHEST - 2 VIEW COMPARISON:  Chest x-ray dated Apr 21, 2015. FINDINGS: The heart size and mediastinal contours are within normal limits. Normal pulmonary vascularity. Atherosclerotic calcification of the aortic arch. No focal consolidation, pleural effusion, or pneumothorax. Bilateral nipple shadows again noted. No acute osseous abnormality. IMPRESSION: No active cardiopulmonary disease. Electronically Signed   By: Titus Dubin M.D.   On: 02/28/2018 15:12   Ct Head Wo Contrast  Result Date: 02/28/2018 CLINICAL DATA:  Altered level of consciousness. EXAM: CT HEAD WITHOUT CONTRAST TECHNIQUE: Contiguous axial images were obtained from the base of the skull through the vertex without intravenous contrast. COMPARISON:  None. FINDINGS: Brain: Mild diffuse cortical atrophy is noted. Mild chronic ischemic white matter disease is noted. No mass effect or midline shift is noted. Ventricular size is within normal limits. There is no evidence of mass lesion or hemorrhage. Right basal ganglia low density is noted consistent with infarction of indeterminate age. Vascular: No hyperdense vessel or unexpected calcification. Skull: Normal. Negative for fracture or focal lesion. Sinuses/Orbits: No acute finding. Other: None. IMPRESSION: Mild diffuse cortical atrophy. Mild chronic ischemic white matter disease. Right basal ganglia low density is noted consistent with infarction of  indeterminate age; MRI may be performed for further evaluation. Electronically Signed   By: Marijo Conception, M.D.   On: 02/28/2018 21:51   Mr Brain Wo Contrast  Result Date: 03/01/2018 CLINICAL DATA:  TIA weakness prostate cancer EXAM: MRI HEAD WITHOUT CONTRAST TECHNIQUE: Multiplanar, multiecho pulse sequences of the brain and surrounding structures were obtained without intravenous contrast. COMPARISON:  CT head 02/28/2018 FINDINGS: Brain: Acute infarct in the right putamen and internal capsule measuring up to 15 mm. This corresponds to the hypodensity on CT. No other acute infarct. Mild atrophy without hydrocephalus. Moderate chronic microvascular ischemic change in the white matter and pons. Negative for hemorrhage or mass lesion. No midline shift. Vascular: Normal arterial flow voids Skull and upper cervical spine: Negative Sinuses/Orbits: Prior surgery with medial antrostomy and ethmoidectomy. Mucosal edema throughout the paranasal sinuses. Bilateral cataract removal. Negative for orbital mass. Other: None IMPRESSION: Acute infarct right putamen and internal capsule fibers. Atrophy with moderate chronic microvascular ischemia. Electronically Signed   By: Franchot Gallo M.D.   On: 03/01/2018 08:04   US Carotid Bilateral (at [facility] And [facility] Only)  Result Date: 03/01/2018 CLINICAL DATA:  Altered mental status.  Blurry vision. EXAM: BILATERAL CAROTID DUPLEX ULTRASOUND TECHNIQUE: Pearline Cables scale imaging, color Doppler and duplex ultrasound were performed of bilateral carotid and vertebral arteries in the neck. COMPARISON:  None. FINDINGS: Criteria: Quantification of carotid stenosis is based on velocity parameters that correlate the residual internal carotid diameter with NASCET-based stenosis levels, using the diameter of the distal internal carotid lumen as the denominator for stenosis measurement. The following velocity measurements were obtained: RIGHT ICA:  94 cm/sec CCA:  177 cm/sec SYSTOLIC ICA/CCA RATIO:  0.7  DIASTOLIC ICA/CCA RATIO:  1.3 ECA:  85 cm/sec LEFT ICA:  78 cm/sec CCA:  81 cm/sec SYSTOLIC ICA/CCA RATIO:  1.0 DIASTOLIC ICA/CCA RATIO:  1.2 ECA:  82 cm/sec RIGHT CAROTID ARTERY: Mild irregular calcified plaque in the bulb. Low resistance internal  carotid Doppler pattern is preserved. RIGHT VERTEBRAL ARTERY:  Antegrade. LEFT CAROTID ARTERY: Minimal calcified plaque along the wall of the bulb. Low resistance internal carotid Doppler pattern. LEFT VERTEBRAL ARTERY:  Antegrade. IMPRESSION: Less than 50% stenosis in the right and left internal carotid arteries. Electronically Signed   By: Marybelle Killings M.D.   On: 03/01/2018 10:09   Mr Jodene Nam Head/brain CN Cm  Result Date: 03/01/2018 CLINICAL DATA:  Altered mental status.  Prostate cancer. EXAM: MRA HEAD WITHOUT CONTRAST TECHNIQUE: Angiographic images of the Circle of Willis were obtained using MRA technique without intravenous contrast. COMPARISON:  CT head 02/28/2018 FINDINGS: Both vertebral arteries widely patent. Basilar widely patent. Left PICA patent. Right PICA not visualized. Posterior cerebral arteries patent bilaterally without stenosis Internal carotid artery widely patent bilaterally. Symmetric decreased signal in the M1 segment bilaterally is felt to be artifact due to tortuosity. Anterior middle cerebral arteries otherwise widely patent. No stenosis or aneurysm IMPRESSION: Negative Electronically Signed   By: Franchot Gallo M.D.   On: 03/01/2018 08:01      Subjective: The patient is more awake and alert this morning.  He is eating and drinking well with no complaints.   Discharge Exam: Vitals:   03/02/18 0420 03/02/18 0755  BP: (!) 171/85 (!) 181/86  Pulse: 84 65  Resp: 18 17  Temp: 98.4 F (36.9 C) 97.6 F (36.4 C)  SpO2: 93% 96%   Vitals:   03/01/18 2330 03/02/18 0130 03/02/18 0420 03/02/18 0755  BP: (!) 158/98 (!) 147/101 (!) 171/85 (!) 181/86  Pulse: 82 87 84 65  Resp: 20 20 18 17   Temp: 98.4 F (36.9 C) 98.1 F (36.7 C) 98.4  F (36.9 C) 97.6 F (36.4 C)  TempSrc: Oral Oral Oral Axillary  SpO2: 96% 95% 93% 96%  Weight:      Height:        General: Pt is alert, awake, not in acute distress Cardiovascular: RRR, S1/S2 +, no rubs, no gallops Respiratory: CTA bilaterally, no wheezing, no rhonchi Abdominal: Soft, NT, ND, bowel sounds + Extremities: no edema, no cyanosis Neurological: Nonfocal.    The results of significant diagnostics from this hospitalization (including imaging, microbiology, ancillary and laboratory) are listed below for reference.     Microbiology: No results found for this or any previous visit (from the past 240 hour(s)).   Labs: BNP (last 3 results) No results for input(s): BNP in the last 8760 hours. Basic Metabolic Panel: Recent Labs  Lab 02/28/18 1523  NA 142  K 3.5  CL 104  CO2 29  GLUCOSE 279*  BUN 24*  CREATININE 1.04  CALCIUM 9.2   Liver Function Tests: Recent Labs  Lab 02/28/18 1523  AST 22  ALT 23  ALKPHOS 61  BILITOT 0.5  PROT 6.8  ALBUMIN 3.7   Recent Labs  Lab 02/28/18 2240  LIPASE 26   No results for input(s): AMMONIA in the last 168 hours. CBC: Recent Labs  Lab 02/28/18 1523  WBC 5.1  NEUTROABS 3.3  HGB 12.2*  HCT 36.6*  MCV 92.7  PLT 155   Cardiac Enzymes: Recent Labs  Lab 02/28/18 2240  TROPONINI <0.03   BNP: Invalid input(s): POCBNP CBG: Recent Labs  Lab 03/01/18 1216 03/01/18 1718 03/01/18 2039 03/02/18 0853 03/02/18 1129  GLUCAP 167* 146* 167* 172* 134*   D-Dimer No results for input(s): DDIMER in the last 72 hours. Hgb A1c Recent Labs    03/01/18 0600  HGBA1C 7.2*   Lipid Profile Recent  Labs    03/01/18 0600 03/02/18 0543  CHOL 151 144  HDL 48 51  LDLCALC 86 80  TRIG 83 67  CHOLHDL 3.1 2.8   Thyroid function studies No results for input(s): TSH, T4TOTAL, T3FREE, THYROIDAB in the last 72 hours.  Invalid input(s): FREET3 Anemia work up Recent Labs    03/01/18 0600  VITAMINB12 405  FOLATE 29.0   FERRITIN 33  TIBC 290  IRON 85  RETICCTPCT 1.5   Urinalysis    Component Value Date/Time   COLORURINE YELLOW 02/28/2018 [place] 02/28/2018 1458   LABSPEC 1.027 02/28/2018 1458   PHURINE 5.0 02/28/2018 1458   GLUCOSEU >=500 (A) 02/28/2018 1458   HGBUR NEGATIVE 02/28/2018 1458   BILIRUBINUR NEGATIVE 02/28/2018 1458   KETONESUR NEGATIVE 02/28/2018 1458   PROTEINUR 30 (A) 02/28/2018 1458   UROBILINOGEN 0.2 09/11/2007 1600   NITRITE NEGATIVE 02/28/2018 1458   LEUKOCYTESUR NEGATIVE 02/28/2018 1458   Sepsis Labs Invalid input(s): PROCALCITONIN,  WBC,  LACTICIDVEN Microbiology No results found for this or any previous visit (from the past 240 hour(s)).  Time coordinating discharge: 35 minutes  SIGNED:  Irwin Brakeman, MD  [facility] 03/02/2018, 12:07 PM Pager [phone]  If 7PM-7AM, please contact night-coverage [email or web address] Password [email or web address]

## 2018-03-02 NOTE — Evaluation (Signed)
Occupational Therapy Evaluation Patient Details Name: Luke Solis MRN: [id number] DOB: 1931-01-22 Today's Date: 03/02/2018    History of Present Illness Luke Solis is a 84 y/o male having difficulty with memory, word finding problems, understanding when bening spoken to, and overall slower than normal. MRI positive for acute infarct.    Clinical Impression   Pt sleeping soundly on OT arrival, easily awakened with calling name, agreeable to OT evaluation. Wife present for evaluation, reports pt as independent with all B/IADLs PTA. During evaluation pt demonstrates BUE strength 4+/5, coordination and sensation are intact. Pt completing ADLs with supervision, min guard for standing tasks as pt still sleepy during evaluation. Pt will have 24/7 supervision on discharge, no further OT services required at this time. Recommend tub/shower seat or bench for safety with showering. Educated wife on having grab bars installed in shower as well.     Follow Up Recommendations  No OT follow up;Supervision/Assistance - 24 hour    Equipment Recommendations  Tub/shower bench       Precautions / Restrictions Precautions Precautions: Fall Restrictions Weight Bearing Restrictions: No      Mobility Bed Mobility Overal bed mobility: Modified Independent                Transfers Overall transfer level: Needs assistance Equipment used: None Transfers: Sit to/from Stand Sit to Stand: Min guard                  ADL either performed or assessed with clinical judgement   ADL Overall ADL's : Needs assistance/impaired Eating/Feeding: Modified independent;Sitting   Grooming: Wash/dry hands;Wash/dry face;Supervision/safety;Standing               Lower Body Dressing: Modified independent;Sitting/lateral leans               Functional mobility during ADLs: Supervision/safety;Min guard       Vision Baseline Vision/History: Wears glasses Wears Glasses: Reading  only Patient Visual Report: No change from baseline Vision Assessment?: Yes Eye Alignment: Within Functional Limits Ocular Range of Motion: Within Functional Limits Alignment/Gaze Preference: Within Defined Limits Tracking/Visual Pursuits: Able to track stimulus in all quads without difficulty Saccades: Within functional limits Convergence: Within functional limits Visual Fields: No apparent deficits            Hand Dominance Right   Extremity/Trunk Assessment Upper Extremity Assessment Upper Extremity Assessment: Overall WFL for tasks assessed   Lower Extremity Assessment Lower Extremity Assessment: Defer to PT evaluation   Cervical / Trunk Assessment Cervical / Trunk Assessment: Normal   Communication Communication Communication: No difficulties   Cognition Arousal/Alertness: Awake/alert Behavior During Therapy: WFL for tasks assessed/performed Overall Cognitive Status: Within Functional Limits for tasks assessed                                                Home Living Family/patient expects to be discharged to:: Private residence Living Arrangements: Spouse/significant other Available Help at Discharge: Family;Available 24 hours/day Type of Home: House Home Access: Stairs to enter [employer] of Steps: 4 Entrance Stairs-Rails: Right Home Layout: Multi-level Alternate Level Stairs-Number of Steps: 14 Alternate Level Stairs-Rails: Left Bathroom Shower/Tub: Teacher, early years/pre: Handicapped height     Home Equipment: Other (comment)(2 walking sticks)          Prior Functioning/Environment Level of Independence: Independent  Comments: independent with ADLS and functional mobility        OT Problem List: Decreased activity tolerance      OT Treatment/Interventions:      OT Goals(Current goals can be found in the care plan section) Acute Rehab OT Goals Patient Stated Goal: return home   OT  Frequency:      End of Session Equipment Utilized During Treatment: Gait belt  Activity Tolerance: Patient tolerated treatment well Patient left: in bed;with call bell/phone within reach;with family/visitor present  OT Visit Diagnosis: Muscle weakness (generalized) (M62.81)                Time: 4320-0379 OT Time Calculation (min): 41 min Charges:  OT General Charges $OT Visit: 1 Visit OT Evaluation $OT Eval Low Complexity: [place], OTR/L  [phone] 03/02/2018, 8:34 AM

## 2018-03-02 NOTE — Progress Notes (Signed)
Physical Therapy Treatment Patient Details Name: Luke Solis MRN: [id number] DOB: [birth year] Today's Date: 03/02/2018    History of Present Illness Luke Solis is a 84 y/o male having difficulty with memory, word finding problems, understanding when bening spoken to, and overall slower than normal. MRI positive for acute infarct.     PT Comments    Patient presents up in bathroom being cleaned by his spouse and agreeable for therapy.  Patient demonstrates good return for going up/down stairs in stairwell with spouse present for family training, patient and spouse acknowledges understanding how provide proper assistance/positioning to avoid falls on stairs.  Patient demonstrates slightly labored cadence with occasional VC's to not look down when walking in hallway and tolerated sitting up at bedside after therapy.  Patient will benefit from continued physical therapy in hospital and recommended venue below to increase strength, balance, endurance for safe ADLs and gait.   Follow Up Recommendations  Home health PT;Supervision for mobility/OOB     Equipment Recommendations  None recommended by PT    Recommendations for Other Services       Precautions / Restrictions Precautions Precautions: Fall Restrictions Weight Bearing Restrictions: No    Mobility  Bed Mobility Overal bed mobility: Modified Independent                Transfers Overall transfer level: Needs assistance Equipment used: None Transfers: Sit to/from Stand;Stand Pivot Transfers Sit to Stand: Supervision Stand pivot transfers: Supervision       General transfer comment: slow slightly labored movement  Ambulation/Gait Ambulation/Gait assistance: Supervision Ambulation Distance (Feet): 130 Feet Assistive device: None Gait Pattern/deviations: Decreased step length - right;Decreased step length - left;Decreased stride length   Gait velocity interpretation: Below normal speed for age/gender General  Gait Details: slightly labored slow cadence without loss of balance   Stairs Stairs: Yes   Stair Management: One rail Right;One rail Left;Alternating pattern;Step to pattern Number of Stairs: 9 General stair comments: Patient demonstrates good return for going up/down steps using 1 siderail with alternating pattern going up, step to pattern going down without loss of balance, patient's spouse present for family training  Wheelchair Mobility    Modified Rankin (Stroke Patients Only)       Balance Overall balance assessment: Mild deficits observed, not formally tested                                          Cognition Arousal/Alertness: Awake/alert Behavior During Therapy: WFL for tasks assessed/performed Overall Cognitive Status: Within Functional Limits for tasks assessed                                        Exercises      General Comments        Pertinent Vitals/Pain Pain Assessment: No/denies pain    Home Living                      Prior Function            PT Goals (current goals can now be found in the care plan section) Acute Rehab PT Goals Patient Stated Goal: return home  PT Goal Formulation: With patient/family Time For Goal Achievement: 03/04/18 Potential to Achieve Goals: Good Progress towards PT goals: Progressing toward goals  Frequency    7X/week      PT Plan Current plan remains appropriate    Co-evaluation              AM-PAC PT "6 Clicks" Daily Activity  Outcome Measure  Difficulty turning over in bed (including adjusting bedclothes, sheets and blankets)?: None Difficulty moving from lying on back to sitting on the side of the bed? : None Difficulty sitting down on and standing up from a chair with arms (e.g., wheelchair, bedside commode, etc,.)?: None Help needed moving to and from a bed to chair (including a wheelchair)?: A Little Help needed walking in hospital room?: A  Little Help needed climbing 3-5 steps with a railing? : A Little 6 Click Score: 21    End of Session Equipment Utilized During Treatment: Gait belt Activity Tolerance: Patient tolerated treatment well Patient left: in bed;with call bell/phone within reach;with family/visitor present(seated at bedside) Nurse Communication: Mobility status PT Visit Diagnosis: Unsteadiness on feet (R26.81);Other abnormalities of gait and mobility (R26.89);Muscle weakness (generalized) (M62.81)     Time: 0488-8916 PT Time Calculation (min) (ACUTE ONLY): 23 min  Charges:  $Therapeutic Activity: 23-37 mins                    G Codes:       3:23 PM, 16-Mar-2018 Lonell Grandchild, MPT Physical Therapist with [facility] [phone] office [phone] mobile phone

## 2018-03-02 NOTE — Discharge Instructions (Signed)
Cardiology will send you a 30 day event monitor to your home for you to wear.   Please see your primary care provider in 1 week for follow up.  Please see your neurologist in 1 month for stroke follow up.   Physical therapy will be arranged to come to your home for therapy.  Please work with your primary care provider to improve your blood pressure.     Follow with Primary MD  Kathyrn Drown, MD  and other consultant's as instructed your Hospitalist MD  Please get a complete blood count and chemistry panel checked by your Primary MD at your next visit, and again as instructed by your Primary MD.  Get Medicines reviewed and adjusted: Please take all your medications with you for your next visit with your Primary MD  Laboratory/radiological data: Please request your Primary MD to go over all hospital tests and procedure/radiological results at the follow up, please ask your Primary MD to get all Hospital records sent to his/her office.  In some cases, they will be blood work, cultures and biopsy results pending at the time of your discharge. Please request that your primary care M.D. follows up on these results.  Also Note the following: If you experience worsening of your admission symptoms, develop shortness of breath, life threatening emergency, suicidal or homicidal thoughts you must seek medical attention immediately by calling 911 or calling your MD immediately  if symptoms less severe.  You must read complete instructions/literature along with all the possible adverse reactions/side effects for all the Medicines you take and that have been prescribed to you. Take any new Medicines after you have completely understood and accpet all the possible adverse reactions/side effects.   Do not drive when taking Pain medications or sleeping medications (Benzodaizepines)  Do not take more than prescribed Pain, Sleep and Anxiety Medications. It is not advisable to combine anxiety,sleep and pain  medications without talking with your primary care practitioner  Special Instructions: If you have smoked or chewed Tobacco  in the last 2 yrs please stop smoking, stop any regular Alcohol  and or any Recreational drug use.  Wear Seat belts while driving.  Please note: You were cared for by a hospitalist during your hospital stay. Once you are discharged, your primary care physician will handle any further medical issues. Please note that NO REFILLS for any discharge medications will be authorized once you are discharged, as it is imperative that you return to your primary care physician (or establish a relationship with a primary care physician if you do not have one) for your post hospital discharge needs so that they can reassess your need for medications and monitor your lab values.   Fall Prevention in the Home Falls can cause injuries. They can happen to people of all ages. There are many things you can do to make your home safe and to help prevent falls. What can I do on the outside of my home?  Regularly fix the edges of walkways and driveways and fix any cracks.  Remove anything that might make you trip as you walk through a door, such as a raised step or threshold.  Trim any bushes or trees on the path to your home.  Use bright outdoor lighting.  Clear any walking paths of anything that might make someone trip, such as rocks or tools.  Regularly check to see if handrails are loose or broken. Make sure that both sides of any steps have handrails.  Any  raised decks and porches should have guardrails on the edges.  Have any leaves, snow, or ice cleared regularly.  Use sand or salt on walking paths during winter.  Clean up any spills in your garage right away. This includes oil or grease spills. What can I do in the bathroom?  Use night lights.  Install grab bars by the toilet and in the tub and shower. Do not use towel bars as grab bars.  Use non-skid mats or decals in  the tub or shower.  If you need to sit down in the shower, use a plastic, non-slip stool.  Keep the floor dry. Clean up any water that spills on the floor as soon as it happens.  Remove soap buildup in the tub or shower regularly.  Attach bath mats securely with double-sided non-slip rug tape.  Do not have throw rugs and other things on the floor that can make you trip. What can I do in the bedroom?  Use night lights.  Make sure that you have a light by your bed that is easy to reach.  Do not use any sheets or blankets that are too big for your bed. They should not hang down onto the floor.  Have a firm chair that has side arms. You can use this for support while you get dressed.  Do not have throw rugs and other things on the floor that can make you trip. What can I do in the kitchen?  Clean up any spills right away.  Avoid walking on wet floors.  Keep items that you use a lot in easy-to-reach places.  If you need to reach something above you, use a strong step stool that has a grab bar.  Keep electrical cords out of the way.  Do not use floor polish or wax that makes floors slippery. If you must use wax, use non-skid floor wax.  Do not have throw rugs and other things on the floor that can make you trip. What can I do with my stairs?  Do not leave any items on the stairs.  Make sure that there are handrails on both sides of the stairs and use them. Fix handrails that are broken or loose. Make sure that handrails are as long as the stairways.  Check any carpeting to make sure that it is firmly attached to the stairs. Fix any carpet that is loose or worn.  Avoid having throw rugs at the top or bottom of the stairs. If you do have throw rugs, attach them to the floor with carpet tape.  Make sure that you have a light switch at the top of the stairs and the bottom of the stairs. If you do not have them, ask someone to add them for you. What else can I do to help prevent  falls?  Wear shoes that: ? Do not have high heels. ? Have rubber bottoms. ? Are comfortable and fit you well. ? Are closed at the toe. Do not wear sandals.  If you use a stepladder: ? Make sure that it is fully opened. Do not climb a closed stepladder. ? Make sure that both sides of the stepladder are locked into place. ? Ask someone to hold it for you, if possible.  Clearly mark and make sure that you can see: ? Any grab bars or handrails. ? First and last steps. ? Where the edge of each step is.  Use tools that help you move around (mobility aids) if they  are needed. These include: ? Canes. ? Walkers. ? Scooters. ? Crutches.  Turn on the lights when you go into a dark area. Replace any light bulbs as soon as they burn out.  Set up your furniture so you have a clear path. Avoid moving your furniture around.  If any of your floors are uneven, fix them.  If there are any pets around you, be aware of where they are.  Review your medicines with your doctor. Some medicines can make you feel dizzy. This can increase your chance of falling. Ask your doctor what other things that you can do to help prevent falls. This information is not intended to replace advice given to you by your health care provider. Make sure you discuss any questions you have with your health care provider. Document Released: 09/18/2009 Document Revised: 04/29/2016 Document Reviewed: 12/27/2014 Elsevier Interactive Patient Education  [employer].

## 2018-03-02 NOTE — Progress Notes (Signed)
Nurse says that family unable to arrange for bed at home and cannot take him home today.  They are working on arranging to be able to take him home in the morning.  Pt can discharge in the morning.   Murvin Natal MD

## 2018-03-02 NOTE — Evaluation (Signed)
Speech Language Pathology Evaluation Patient Details Name: Luke Solis MRN: [id number] DOB: 1931/02/20 Today's Date: 03/02/2018 Time: 5809-9833 SLP Time Calculation (min) (ACUTE ONLY): 30 min  Problem List:  Patient Active Problem List   Diagnosis Date Noted  . Altered mental status 02/28/2018  . Anemia 02/28/2018  . Fatigue 08/03/2017  . Prostate cancer ([place]) 10/22/2015  . Forgetfulness 04/29/2015  . Type 2 diabetes mellitus with diabetic neuropathy ([place]) 07/17/2013  . Hyperlipidemia 01/19/2010  . OSA (obstructive sleep apnea) 01/19/2010  . Essential hypertension, benign 01/19/2010  . Coronary atherosclerosis of native coronary artery 01/19/2010   Past Medical History:  Past Medical History:  Diagnosis Date  . Coronary atherosclerosis of native coronary artery    BMS proximal and mid RCA as well as BMS circumflex - 2002 (had residual 70% distal LAD), LVEF 60%  . Essential hypertension, benign   . Hyperlipidemia   . Neuropathy   . NSTEMI (non-ST elevated myocardial infarction) ([place])    2002  . Prostate cancer ([place])   . Sleep apnea    On CPAP   Past Surgical History:  Past Surgical History:  Procedure Laterality Date  . COLONOSCOPY    . INGUINAL HERNIA REPAIR    . KNEE SURGERY    . NOSE SURGERY    . PROSTATE SURGERY    . TONSILLECTOMY     HPI:  84 y.o. male with medical history significant of coronary artery disease, essential hypertension, hyperlipidemia, peripheral neuropathy, prostate cancer, sleep apnea on CPAP who is coming to the emergency department due to his family members and patient noticed in that he has been having difficulty with his memory, has been having trouble finding words and occasionally understanding was being spoken to him and overall seems to be slower than usual.  He denies dizziness, headache, blurred vision, slurred speech or gait imbalance.  No fever, chills, sore throat, rhinorrhea, chest pain, dizziness, diaphoresis, dyspnea, PND, orthopnea  or pitting edema of the lower extremities MRI head 03/01/18 indicated acute infarct right putamen and internal capsule fibers' atrophy with moderate chronic microvascular ischemia.  Assessment / Plan / Recommendation Clinical Impression   Pt with minimally dysarthric speech with primarily low volume noted during longer sentence-conversation level which improved with verbal cues/demonstration of increased breath support/vocal intensity with overall intelligibility being 50-75% intelligible.  Overall processing delay noted with attention tasks and language tasks requiring a time constraint with a score of 24/30 achieved on [facility] Cognitive Assessment) where a score of 26/30 is considered WFL.  Pt c/o noticing "a delay in my thinking" during various tasks; pt would provide correct answers if given enough time, but when timed task required, he would not meet this criteria during the assessment.  This was also noted intermittently during conversational attempts during the evaluation pertaining to personal information.  Recommend HH SLP and/or OP SLP for f/u with dysarthria management and/or cognitive rehab to improve cognition/speech accuracy to maximum level; thank you for this consult.    SLP Assessment  SLP Recommendation/Assessment: All further Speech Language Pathology  needs can be addressed in the next venue of care SLP Visit Diagnosis: Dysarthria and anarthria (R47.1);Cognitive communication deficit (R41.841)    Follow Up Recommendations  Home health SLP;Outpatient SLP;Other (family preference)    Frequency and Duration   n/a        SLP Evaluation Cognition  Overall Cognitive Status: Impaired/Different from baseline Arousal/Alertness: Awake/alert Orientation Level: Oriented X4 Attention: Selective(doesn't have HA's available which may impact as well) Sustained Attention:  Impaired Sustained Attention Impairment: Verbal basic;Functional basic Selective Attention: Impaired Selective  Attention Impairment: Verbal basic;Functional basic Memory: Appears intact Awareness: Appears intact Problem Solving: Appears intact Safety/Judgment: Appears intact       Comprehension  Auditory Comprehension Overall Auditory Comprehension: Other (comment)(appears WFL, but delay in processing information) Interfering Components: Attention;Processing speed EffectiveTechniques: Extra processing time;Increased volume Visual Recognition/Discrimination Discrimination: Not tested Reading Comprehension Reading Status: Within funtional limits    Expression Expression Primary Mode of Expression: Verbal Verbal Expression Overall Verbal Expression: Impaired Initiation: No impairment Level of Generative/Spontaneous Verbalization: Conversation Repetition: No impairment Naming: No impairment Pragmatics: No impairment Interfering Components: Other (comment)(intermittent episodes of aphasia vs processing?) Non-Verbal Means of Communication: Not applicable Other Verbal Expression Comments: Intermittent aphasia per pt report, but [facility] for tasks assessed Written Expression Dominant Hand: Right Written Expression: Not tested   Oral / Motor  Oral Motor/Sensory Function Overall Oral Motor/Sensory Function: Within functional limits Motor Speech Overall Motor Speech: Appears within functional limits for tasks assessed Respiration: Impaired Level of Impairment: Conversation Phonation: Low vocal intensity Resonance: Within functional limits Articulation: Impaired Level of Impairment: Conversation Intelligibility: Intelligibility reduced Word: 75-100% accurate Phrase: 75-100% accurate Sentence: 50-74% accurate Conversation: 50-74% accurate Motor Planning: Witnin functional limits Motor Speech Errors: Not applicable Interfering Components: Hearing loss Effective Techniques: Increased vocal intensity                       Elvina Sidle, M.S., CCC-SLP 03/02/2018, 4:37 PM

## 2018-03-02 NOTE — Care Management Note (Signed)
Case Management Note  Patient Details  Name: Luke Solis MRN: [id number] Date of Birth: July 17, 1931  Subjective/Objective:               Admitted with CVA. Pt is from home. Lives with wife. Was very ind prior to CVA. He drove. Ambulated with no assistive devices. He has insurance and PCP. This CM discussed PT recommendations with patent and wife at bedside. D/t pt's activity level CM offered OP PT and family feel HH PT would be best to start with so PT can modify his education to meet pt's needs in his own home. Wife plans to move his bed down stairs so he can stay on first level. Wife is anxious and hopes patient can stay in hospital more days as she does not feel ready for him to return home.       Action/Plan: Pt plans to return home with [facility] referral. He needs to DME for ambulation. CM discusses with wife a shower stool and bars and where to get those. Wife has chosen [facility] from list of [facility] providers. She is aware HH has 48 hrs to make first visit. [name], [facility] rep, aware of referral and will pull pt info from chart.    Expected Discharge Date:  03/02/18               Expected Discharge Plan:  [place]  In-House Referral:  NA  Discharge planning Services  CM Consult  Post Acute Care Choice:  Home Health Choice offered to:  Spouse, Patient  Status of Service:  Completed, signed off  [name], RN 03/02/2018, 12:44 PM

## 2018-03-03 LAB — GLUCOSE, CAPILLARY: GLUCOSE-CAPILLARY: 140 mg/dL — AB (ref 65–99)

## 2018-03-03 NOTE — Progress Notes (Addendum)
IV discontinued,catheter intact. Discharged home with instructions given on medications and follow up visits,patient and family verbalized understanding. Prescriptions sent to Pharmacy of choice documented on AVS.  Patient and family given Mapping your way to recovery stroke booklet,verbalized understanding. Accompanied by staff to an awaiting vehicle.

## 2018-03-03 NOTE — Telephone Encounter (Signed)
I called and spoke with the pt dtr Georgie she is aware of all.

## 2018-03-06 ENCOUNTER — Telehealth: Payer: Self-pay | Admitting: Family Medicine

## 2018-03-06 DIAGNOSIS — G4733 Obstructive sleep apnea (adult) (pediatric): Secondary | ICD-10-CM | POA: Diagnosis not present

## 2018-03-06 DIAGNOSIS — E785 Hyperlipidemia, unspecified: Secondary | ICD-10-CM | POA: Diagnosis not present

## 2018-03-06 DIAGNOSIS — I252 Old myocardial infarction: Secondary | ICD-10-CM | POA: Diagnosis not present

## 2018-03-06 DIAGNOSIS — Z8673 Personal history of transient ischemic attack (TIA), and cerebral infarction without residual deficits: Secondary | ICD-10-CM | POA: Diagnosis not present

## 2018-03-06 DIAGNOSIS — Z8546 Personal history of malignant neoplasm of prostate: Secondary | ICD-10-CM | POA: Diagnosis not present

## 2018-03-06 DIAGNOSIS — I2510 Atherosclerotic heart disease of native coronary artery without angina pectoris: Secondary | ICD-10-CM | POA: Diagnosis not present

## 2018-03-06 DIAGNOSIS — D649 Anemia, unspecified: Secondary | ICD-10-CM | POA: Diagnosis not present

## 2018-03-06 DIAGNOSIS — Z7984 Long term (current) use of oral hypoglycemic drugs: Secondary | ICD-10-CM | POA: Diagnosis not present

## 2018-03-06 DIAGNOSIS — I10 Essential (primary) hypertension: Secondary | ICD-10-CM | POA: Diagnosis not present

## 2018-03-06 DIAGNOSIS — E1142 Type 2 diabetes mellitus with diabetic polyneuropathy: Secondary | ICD-10-CM | POA: Diagnosis not present

## 2018-03-06 DIAGNOSIS — Z7982 Long term (current) use of aspirin: Secondary | ICD-10-CM | POA: Diagnosis not present

## 2018-03-06 NOTE — Telephone Encounter (Signed)
Please inform the patient's family that I would like to do a home visit Wednesday evening after the afternoon office hours.  It would be most likely in the vicinity of 5:30 PM- 6 pm, please make sure that this is okay with family.  Nurses please have the patient put on my schedule on Wednesday at 11:30 AM even though I am doing a home visit at 530 thank you

## 2018-03-06 NOTE — Telephone Encounter (Signed)
Go ahead and start physical therapy verbal order

## 2018-03-06 NOTE — Telephone Encounter (Signed)
[facility] needing verbal for patient for physical therapy. [phone]

## 2018-03-06 NOTE — Telephone Encounter (Signed)
Spoke with [phone]) from [facility] and gave order for PT.

## 2018-03-07 NOTE — Telephone Encounter (Signed)
Spoke with patient. Patient stated Wednesday between 530-6 would be fine. Nurse communicated to front for pt to be put on schedule.

## 2018-03-08 ENCOUNTER — Encounter: Payer: Self-pay | Admitting: Family Medicine

## 2018-03-08 ENCOUNTER — Ambulatory Visit (INDEPENDENT_AMBULATORY_CARE_PROVIDER_SITE_OTHER): Payer: Medicare Other | Admitting: Family Medicine

## 2018-03-08 ENCOUNTER — Telehealth: Payer: Self-pay | Admitting: Family Medicine

## 2018-03-08 DIAGNOSIS — I2510 Atherosclerotic heart disease of native coronary artery without angina pectoris: Secondary | ICD-10-CM | POA: Diagnosis not present

## 2018-03-08 DIAGNOSIS — G4733 Obstructive sleep apnea (adult) (pediatric): Secondary | ICD-10-CM

## 2018-03-08 DIAGNOSIS — R270 Ataxia, unspecified: Secondary | ICD-10-CM

## 2018-03-08 DIAGNOSIS — I639 Cerebral infarction, unspecified: Secondary | ICD-10-CM | POA: Diagnosis not present

## 2018-03-08 DIAGNOSIS — I679 Cerebrovascular disease, unspecified: Secondary | ICD-10-CM

## 2018-03-08 DIAGNOSIS — I252 Old myocardial infarction: Secondary | ICD-10-CM | POA: Diagnosis not present

## 2018-03-08 DIAGNOSIS — E1140 Type 2 diabetes mellitus with diabetic neuropathy, unspecified: Secondary | ICD-10-CM

## 2018-03-08 DIAGNOSIS — E1142 Type 2 diabetes mellitus with diabetic polyneuropathy: Secondary | ICD-10-CM | POA: Diagnosis not present

## 2018-03-08 DIAGNOSIS — Z8673 Personal history of transient ischemic attack (TIA), and cerebral infarction without residual deficits: Secondary | ICD-10-CM | POA: Diagnosis not present

## 2018-03-08 DIAGNOSIS — I10 Essential (primary) hypertension: Secondary | ICD-10-CM | POA: Diagnosis not present

## 2018-03-08 DIAGNOSIS — D649 Anemia, unspecified: Secondary | ICD-10-CM | POA: Diagnosis not present

## 2018-03-08 NOTE — Telephone Encounter (Signed)
Please put the patient down for a home visit on April 18 at 11:30 AM Thursday

## 2018-03-08 NOTE — Progress Notes (Signed)
Complex home visit Patient recently in the hospital This is a follow-up Patient had a intracerebral hemorrhage Patient has diabetes hypertension Patient also has having frailty as well as ataxia Also having weakness Patient is on medication for peripheral neuropathy as well as blood pressure diabetes and cholesterol Patient stroke cause significant cognitive dysfunction as well as some memory function issues Patient is homebound Patient is restricted was instructed not to drive He is to stay on his first floor of his house The goal is to get the patient back to the point where he can operate independent again Cardiology is sending out an event monitor for him to wear The patient did have a MRA MRI CT scan as well as carotid ultrasound plus also A1c and lipid profile along with metabolic 7 while in the hospital The patient is set up to follow-up with neurology Patient received neurology care while in the hospital Patient has not had a stroke before Vital signs respiratory rate normal heart is regular rate is approximately 80 pulses normal Blood pressure 130/70 Extremities no edema skin warm dry Patient has good grip bilateral good bicep bilateral good shoulder muscles bilateral also has the ability to push down well as well as lift his great toe up on both sides Patient was observed walking he has to use a cane to walk he does have some mild ataxia and some wavering when he walks that increases the risk of falling Patient is alert and oriented to where he is aware what time of day it is and who I was Complex cognitive testing was not completed today the patient was told that this will be on his next visit in several weeks The patient is homebound because of ataxia and weakness The patient would benefit from home health as well as physical therapy The patient will also benefit from follow-up of his telemetry The patient will also benefit from neurology consultation as an outpatient In  addition to this we will repeat lipid profile if it still shows LDL above 70 we will change to Crestor We will start deprescribing medication as long as his painful peripheral neuropathy tolerates it We will reduce nortriptyline instead of 3 tablets each evening next step would be 2 tablets each evening over time we may be able to reduce nortriptyline and Lyrica to see if this will help his strength lessen ataxia and lessen his fatigue and tiredness  Patient also has sleep apnea and is states he has not been using his CPAP machine over the past couple weeks we will have home health come out and do an analysis on his machine to make sure it is working properly and do a download on the information chip We will also consult a sleep specialist with neurology The patient may be having narcolepsy as well as sleep apnea may benefit from a stimulant in the morning Family relates that he is sleeping a lot this is been going on for several months  50 minutes spent with patient complex decision-making along with detailed instruction to the family review of the patient's care review with the patient of his test results as well as medication list as well as the plan  We will follow him up in approximately 4 weeks  Also family would prefer to follow-up with Dr.Sethi because she has seen the patient's wife before for stroke related symptoms

## 2018-03-09 ENCOUNTER — Other Ambulatory Visit: Payer: Self-pay | Admitting: *Deleted

## 2018-03-09 ENCOUNTER — Telehealth: Payer: Self-pay | Admitting: Family Medicine

## 2018-03-09 DIAGNOSIS — G4730 Sleep apnea, unspecified: Secondary | ICD-10-CM

## 2018-03-09 DIAGNOSIS — Z79899 Other long term (current) drug therapy: Secondary | ICD-10-CM

## 2018-03-09 DIAGNOSIS — E785 Hyperlipidemia, unspecified: Secondary | ICD-10-CM

## 2018-03-09 NOTE — Telephone Encounter (Signed)
Patient has been complaining of his right arm hurting, started this morning.  Please advise.

## 2018-03-09 NOTE — Telephone Encounter (Signed)
It would be fine to notify his wife

## 2018-03-09 NOTE — Telephone Encounter (Signed)
Contacted granddaughter Zigmund Daniel and pts arm has stopped hurting.

## 2018-03-09 NOTE — Addendum Note (Signed)
Addended by: Carmelina Noun on: 03/09/2018 11:51 AM   Modules accepted: Orders

## 2018-03-09 NOTE — Telephone Encounter (Signed)
I called the home. Luke Solis, his daughter, answered the phone.  She said that day and time would work well for them.

## 2018-03-09 NOTE — Telephone Encounter (Addendum)
Appointment has been scheduled.  Is the family already aware of appointment or should I call to notify?

## 2018-03-10 DIAGNOSIS — D649 Anemia, unspecified: Secondary | ICD-10-CM | POA: Diagnosis not present

## 2018-03-10 DIAGNOSIS — I252 Old myocardial infarction: Secondary | ICD-10-CM | POA: Diagnosis not present

## 2018-03-10 DIAGNOSIS — E1142 Type 2 diabetes mellitus with diabetic polyneuropathy: Secondary | ICD-10-CM | POA: Diagnosis not present

## 2018-03-10 DIAGNOSIS — I10 Essential (primary) hypertension: Secondary | ICD-10-CM | POA: Diagnosis not present

## 2018-03-10 DIAGNOSIS — I2510 Atherosclerotic heart disease of native coronary artery without angina pectoris: Secondary | ICD-10-CM | POA: Diagnosis not present

## 2018-03-10 DIAGNOSIS — Z8673 Personal history of transient ischemic attack (TIA), and cerebral infarction without residual deficits: Secondary | ICD-10-CM | POA: Diagnosis not present

## 2018-03-13 DIAGNOSIS — I2510 Atherosclerotic heart disease of native coronary artery without angina pectoris: Secondary | ICD-10-CM | POA: Diagnosis not present

## 2018-03-13 DIAGNOSIS — D649 Anemia, unspecified: Secondary | ICD-10-CM | POA: Diagnosis not present

## 2018-03-13 DIAGNOSIS — I252 Old myocardial infarction: Secondary | ICD-10-CM | POA: Diagnosis not present

## 2018-03-13 DIAGNOSIS — E1142 Type 2 diabetes mellitus with diabetic polyneuropathy: Secondary | ICD-10-CM | POA: Diagnosis not present

## 2018-03-13 DIAGNOSIS — I10 Essential (primary) hypertension: Secondary | ICD-10-CM | POA: Diagnosis not present

## 2018-03-13 DIAGNOSIS — Z8673 Personal history of transient ischemic attack (TIA), and cerebral infarction without residual deficits: Secondary | ICD-10-CM | POA: Diagnosis not present

## 2018-03-14 ENCOUNTER — Ambulatory Visit (INDEPENDENT_AMBULATORY_CARE_PROVIDER_SITE_OTHER): Payer: Medicare Other

## 2018-03-14 DIAGNOSIS — I499 Cardiac arrhythmia, unspecified: Secondary | ICD-10-CM | POA: Diagnosis not present

## 2018-03-14 DIAGNOSIS — I498 Other specified cardiac arrhythmias: Secondary | ICD-10-CM | POA: Diagnosis not present

## 2018-03-15 DIAGNOSIS — I2510 Atherosclerotic heart disease of native coronary artery without angina pectoris: Secondary | ICD-10-CM | POA: Diagnosis not present

## 2018-03-15 DIAGNOSIS — Z8673 Personal history of transient ischemic attack (TIA), and cerebral infarction without residual deficits: Secondary | ICD-10-CM | POA: Diagnosis not present

## 2018-03-15 DIAGNOSIS — E1142 Type 2 diabetes mellitus with diabetic polyneuropathy: Secondary | ICD-10-CM | POA: Diagnosis not present

## 2018-03-15 DIAGNOSIS — I10 Essential (primary) hypertension: Secondary | ICD-10-CM | POA: Diagnosis not present

## 2018-03-15 DIAGNOSIS — I252 Old myocardial infarction: Secondary | ICD-10-CM | POA: Diagnosis not present

## 2018-03-15 DIAGNOSIS — D649 Anemia, unspecified: Secondary | ICD-10-CM | POA: Diagnosis not present

## 2018-03-17 ENCOUNTER — Telehealth: Payer: Self-pay | Admitting: Family Medicine

## 2018-03-17 DIAGNOSIS — I2510 Atherosclerotic heart disease of native coronary artery without angina pectoris: Secondary | ICD-10-CM | POA: Diagnosis not present

## 2018-03-17 DIAGNOSIS — E1142 Type 2 diabetes mellitus with diabetic polyneuropathy: Secondary | ICD-10-CM | POA: Diagnosis not present

## 2018-03-17 DIAGNOSIS — Z8673 Personal history of transient ischemic attack (TIA), and cerebral infarction without residual deficits: Secondary | ICD-10-CM | POA: Diagnosis not present

## 2018-03-17 DIAGNOSIS — D649 Anemia, unspecified: Secondary | ICD-10-CM | POA: Diagnosis not present

## 2018-03-17 DIAGNOSIS — I252 Old myocardial infarction: Secondary | ICD-10-CM | POA: Diagnosis not present

## 2018-03-17 DIAGNOSIS — I10 Essential (primary) hypertension: Secondary | ICD-10-CM | POA: Diagnosis not present

## 2018-03-17 NOTE — Telephone Encounter (Signed)
Patients daughter, Luke Solis, called to let Dr. Nicki Reaper know that they believe patient's C-pap machine came from [facility].  Because his machine has not been working properly, he has not been using it.  Luke Solis is requesting to have a prescription sent to [facility] today for a c-pap mask so that he can be fitted for it ASAP.

## 2018-03-20 DIAGNOSIS — E1142 Type 2 diabetes mellitus with diabetic polyneuropathy: Secondary | ICD-10-CM | POA: Diagnosis not present

## 2018-03-20 DIAGNOSIS — I252 Old myocardial infarction: Secondary | ICD-10-CM | POA: Diagnosis not present

## 2018-03-20 DIAGNOSIS — Z8673 Personal history of transient ischemic attack (TIA), and cerebral infarction without residual deficits: Secondary | ICD-10-CM | POA: Diagnosis not present

## 2018-03-20 DIAGNOSIS — I2510 Atherosclerotic heart disease of native coronary artery without angina pectoris: Secondary | ICD-10-CM | POA: Diagnosis not present

## 2018-03-20 NOTE — Telephone Encounter (Signed)
Please sign order for CPAP supplies so I may send with required documentation to [facility] (fax# [phone])  In red folder in yellow box   (pt's CPAP machine is through [facility] but pt wishes to get supplies through [facility])

## 2018-03-21 DIAGNOSIS — D649 Anemia, unspecified: Secondary | ICD-10-CM | POA: Diagnosis not present

## 2018-03-21 DIAGNOSIS — E1142 Type 2 diabetes mellitus with diabetic polyneuropathy: Secondary | ICD-10-CM | POA: Diagnosis not present

## 2018-03-21 DIAGNOSIS — I252 Old myocardial infarction: Secondary | ICD-10-CM | POA: Diagnosis not present

## 2018-03-21 DIAGNOSIS — I10 Essential (primary) hypertension: Secondary | ICD-10-CM | POA: Diagnosis not present

## 2018-03-21 DIAGNOSIS — I2510 Atherosclerotic heart disease of native coronary artery without angina pectoris: Secondary | ICD-10-CM | POA: Diagnosis not present

## 2018-03-21 DIAGNOSIS — Z8673 Personal history of transient ischemic attack (TIA), and cerebral infarction without residual deficits: Secondary | ICD-10-CM | POA: Diagnosis not present

## 2018-03-21 NOTE — Telephone Encounter (Signed)
This was signed thank you 

## 2018-03-22 ENCOUNTER — Other Ambulatory Visit: Payer: Self-pay | Admitting: Family Medicine

## 2018-03-23 ENCOUNTER — Encounter: Payer: Self-pay | Admitting: Family Medicine

## 2018-03-23 ENCOUNTER — Other Ambulatory Visit: Payer: Self-pay | Admitting: *Deleted

## 2018-03-23 ENCOUNTER — Ambulatory Visit (INDEPENDENT_AMBULATORY_CARE_PROVIDER_SITE_OTHER): Payer: Medicare Other | Admitting: Family Medicine

## 2018-03-23 VITALS — BP 140/80 | Ht 74.0 in | Wt 161.6 lb

## 2018-03-23 DIAGNOSIS — Z8673 Personal history of transient ischemic attack (TIA), and cerebral infarction without residual deficits: Secondary | ICD-10-CM | POA: Diagnosis not present

## 2018-03-23 DIAGNOSIS — I252 Old myocardial infarction: Secondary | ICD-10-CM | POA: Diagnosis not present

## 2018-03-23 DIAGNOSIS — D649 Anemia, unspecified: Secondary | ICD-10-CM | POA: Diagnosis not present

## 2018-03-23 DIAGNOSIS — I639 Cerebral infarction, unspecified: Secondary | ICD-10-CM

## 2018-03-23 DIAGNOSIS — F09 Unspecified mental disorder due to known physiological condition: Secondary | ICD-10-CM | POA: Diagnosis not present

## 2018-03-23 DIAGNOSIS — E1142 Type 2 diabetes mellitus with diabetic polyneuropathy: Secondary | ICD-10-CM | POA: Diagnosis not present

## 2018-03-23 DIAGNOSIS — R531 Weakness: Secondary | ICD-10-CM | POA: Diagnosis not present

## 2018-03-23 DIAGNOSIS — I2510 Atherosclerotic heart disease of native coronary artery without angina pectoris: Secondary | ICD-10-CM | POA: Diagnosis not present

## 2018-03-23 DIAGNOSIS — I10 Essential (primary) hypertension: Secondary | ICD-10-CM | POA: Diagnosis not present

## 2018-03-23 MED ORDER — AMLODIPINE BESYLATE 5 MG PO TABS: 5.0000 mg | ORAL_TABLET | Freq: Every day | ORAL | 5 refills | Status: DC

## 2018-03-23 MED ORDER — METFORMIN HCL 500 MG PO TABS: 1000.0000 mg | ORAL_TABLET | Freq: Two times a day (BID) | ORAL | 5 refills | Status: DC

## 2018-03-23 NOTE — Progress Notes (Signed)
   Subjective:    Patient ID: Luke Solis, male    DOB: [birth year], 84 y.o.   MRN: [id number]  HPI Pt originally for home visit.  This gentleman comes in today for follow-up from a stroke He is starting to get around better.  Physical therapy has been working with him.  He is able to walk around with a cane.  He is able to go up and down steps without falling he states his balance is doing better strength is improving.  He denies any confusion.  His wife states that he is starting to get back to his normal level of function Minimal forgetfulness No chest tightness pressure pain Currently wearing cardiac monitor Has consultation in the summer with neurology also has consultation coming up in several weeks with sleep neurologist for evaluation for possible narcolepsy and also his sleep apnea. Patient is interested in being cleared for driving currently as wife is doing all the driving  Review of Systems  Constitutional: Negative for activity change, appetite change and fatigue.  HENT: Negative for congestion and rhinorrhea.   Respiratory: Negative for cough, chest tightness and shortness of breath.   Cardiovascular: Negative for chest pain and leg swelling.  Gastrointestinal: Negative for abdominal pain, diarrhea and nausea.  Endocrine: Negative for polydipsia and polyphagia.  Genitourinary: Negative for dysuria and hematuria.  Neurological: Negative for dizziness, seizures, speech difficulty, weakness, numbness and headaches.  Psychiatric/Behavioral: Negative for confusion and dysphoric mood.       Objective:   Physical Exam  Constitutional: He appears well-nourished. No distress.  HENT:  Head: Normocephalic and atraumatic.  Eyes: Right eye exhibits no discharge. Left eye exhibits no discharge.  Neck: No tracheal deviation present.  Cardiovascular: Normal rate, regular rhythm and normal heart sounds.  No murmur heard. Pulmonary/Chest: Effort normal and breath sounds normal. No  respiratory distress. He has no wheezes.  Musculoskeletal: He exhibits no edema.  Lymphadenopathy:    He has no cervical adenopathy.  Neurological: He is alert.  Skin: Skin is warm. No rash noted.  Psychiatric: His behavior is normal.  Vitals reviewed.  Patient strength overall is doing better.  Patient is able to rise off exam table.  Is able to walk in the hallway turnaround and go back. He does not have any cogwheeling He does not have any tremor He does walk slow with almost a shuffling gait He has never been quick on his feet  [place] cognitive assessment was completed he scored a 22 out of 30     Assessment & Plan:  Patient is improving to some degree compared to where he was continue current measures  Patient will follow-up with neurology  We will do lab work before next visit in several weeks follow-up in 1 month we will revisit our decision  Patient does have mild cognitive dysfunction I think it is safer for his wife to do the driving for now he will follow-up in 4 weeks and possibly start driving then  Lab work ordered before next visit  25 minutes spent with the patient assessing his physical mental capabilities and discussion of stroke prevention greater than half in discussion

## 2018-03-23 NOTE — Telephone Encounter (Signed)
Order faxed & filed

## 2018-03-23 NOTE — Telephone Encounter (Signed)
May have 6 refills 

## 2018-03-24 ENCOUNTER — Telehealth: Payer: Self-pay

## 2018-03-24 MED ORDER — NORTRIPTYLINE HCL 10 MG PO CAPS: ORAL_CAPSULE | ORAL | 5 refills | Status: DC

## 2018-03-24 NOTE — Telephone Encounter (Signed)
May have a prescription for nortriptyline, 2 nightly, may have 1 additional as needed, #90, 5 refills

## 2018-03-24 NOTE — Telephone Encounter (Signed)
Luke Solis is requesting Nortriptyline 10 mg .They state he is taking 2 po qhs on schedule,want to know if they may get rx for 2 qhs and one additional dose prn ? [phone].

## 2018-03-24 NOTE — Telephone Encounter (Signed)
Rx sent to [facility]  

## 2018-03-30 ENCOUNTER — Other Ambulatory Visit: Payer: Self-pay | Admitting: Family Medicine

## 2018-03-31 DIAGNOSIS — Z79899 Other long term (current) drug therapy: Secondary | ICD-10-CM | POA: Diagnosis not present

## 2018-03-31 DIAGNOSIS — E785 Hyperlipidemia, unspecified: Secondary | ICD-10-CM | POA: Diagnosis not present

## 2018-04-01 LAB — LIPID PANEL
Chol/HDL Ratio: 3.7 ratio (ref 0.0–5.0)
Cholesterol, Total: 179 mg/dL (ref 100–199)
HDL: 48 mg/dL (ref >39)
LDL Calculated: 112 mg/dL — ABNORMAL HIGH (ref 0–99)
Triglycerides: 93 mg/dL (ref 0–149)
VLDL Cholesterol Cal: 19 mg/dL (ref 5–40)

## 2018-04-01 LAB — BASIC METABOLIC PANEL
BUN / CREAT RATIO: 20 (ref 10–24)
BUN: 26 mg/dL (ref 8–27)
CALCIUM: 9.6 mg/dL (ref 8.6–10.2)
CHLORIDE: 104 mmol/L (ref 96–106)
CO2: 23 mmol/L (ref 20–29)
Creatinine, Ser: 1.33 mg/dL — ABNORMAL HIGH (ref 0.76–1.27)
GFR calc non Af Amer: 49 mL/min/{1.73_m2} — ABNORMAL LOW (ref >59)
GFR, EST AFRICAN AMERICAN: 56 mL/min/{1.73_m2} — AB (ref >59)
Glucose: 148 mg/dL — ABNORMAL HIGH (ref 65–99)
POTASSIUM: 3.9 mmol/L (ref 3.5–5.2)
Sodium: 144 mmol/L (ref 134–144)

## 2018-04-11 ENCOUNTER — Other Ambulatory Visit: Payer: Self-pay | Admitting: Family Medicine

## 2018-04-11 DIAGNOSIS — I10 Essential (primary) hypertension: Secondary | ICD-10-CM

## 2018-04-11 MED ORDER — ROSUVASTATIN CALCIUM 40 MG PO TABS: 40.0000 mg | ORAL_TABLET | Freq: Every day | ORAL | 5 refills | Status: DC

## 2018-04-13 ENCOUNTER — Encounter: Payer: Self-pay | Admitting: Neurology

## 2018-04-13 ENCOUNTER — Ambulatory Visit (INDEPENDENT_AMBULATORY_CARE_PROVIDER_SITE_OTHER): Payer: Medicare Other | Admitting: Neurology

## 2018-04-13 VITALS — BP 136/74 | HR 69 | Ht 74.0 in | Wt 163.8 lb

## 2018-04-13 DIAGNOSIS — I6381 Other cerebral infarction due to occlusion or stenosis of small artery: Secondary | ICD-10-CM | POA: Diagnosis not present

## 2018-04-13 NOTE — Progress Notes (Signed)
[facility] [address] [place]. [place] [address] [phone]       OFFICE CONSULT NOTE  Luke Solis Date of Birth:  09/28/1931 Medical Record Number:  [id number]   Referring MD:  Sallee Lange Reason for Referral:  Stroke  HPI: Luke Solis is a 84 year old pleasant Caucasian male seen today for initial consultation visit for stroke.  He is accompanied by his wife.  History is obtained from them and review of electronic medical records.  I have personally reviewed imaging films.  He presented to [facility] on 02/28/2018 with a 3-day history of slurred speech and speech difficulties.  On exam he was found to have left facial weakness and slurred speech and MRI scan of the brain was obtained which showed right basal ganglia infarct.  MRI of the brain showed no large vessel stenosis occlusion.  Echocardiogram showed normal ejection fraction without cardiac source of embolism.  .Carotid ultrasound showed no significant extracranial stenosis.  LDL cholesterol was 80 mg percent.  Hemoglobin A1c was 7.2.  The patient states his speech improved quickly.  He was previously on aspirin 81 mg which was increased to 325 mg which is tolerating now well without bruising or bleeding.  He is also on Lipitor and tolerating it well.  His blood pressure is well controlled.  His sugars have also been under good control.  The patient has sleep apnea and has had trouble tolerating his CPAP and he plans to be seen in the sleep disorder clinic to have this looked at.  He denies any prior history of strokes TIAs or other significant neurological problems.  ROS:   14 system review of systems is positive for fatigue, ringing in the ears, hearing loss, incontinence, importance, joint pain and all other systems negative PMH:  Past Medical History:  Diagnosis Date  . Coronary atherosclerosis of native coronary artery    BMS proximal and mid RCA as well as BMS circumflex - 2002 (had residual 70%  distal LAD), LVEF 60%  . Diabetes mellitus without complication ([place])   . Essential hypertension, benign   . Hyperlipidemia   . Neuropathy   . NSTEMI (non-ST elevated myocardial infarction) ([place])    2002  . Prostate cancer ([place])   . Sleep apnea    On CPAP  . Stroke [facility])     Social History:  Social History   Socioeconomic History  . Marital status: Married    Spouse name: Not on file  . Number of children: Not on file  . Years of education: Not on file  . Highest education level: Not on file  Occupational History  . Not on file  Social Needs  . Financial resource strain: Not on file  . Food insecurity:    Worry: Not on file    Inability: Not on file  . Transportation needs:    Medical: Not on file    Non-medical: Not on file  Tobacco Use  . Smoking status: Never Smoker  . Smokeless tobacco: Never Used  Substance and Sexual Activity  . Alcohol use: No    Alcohol/week: 0.0 oz  . Drug use: No  . Sexual activity: Not on file  Lifestyle  . Physical activity:    Days per week: Not on file    Minutes per session: Not on file  . Stress: Not on file  Relationships  . Social connections:    Talks on phone: Not on file    Gets together: Not on  file    Attends religious service: Not on file    Active member of club or organization: Not on file    Attends meetings of clubs or organizations: Not on file    Relationship status: Not on file  . Intimate partner violence:    Fear of current or ex partner: Not on file    Emotionally abused: Not on file    Physically abused: Not on file    Forced sexual activity: Not on file  Other Topics Concern  . Not on file  Social History Narrative   [facility] (08/03/17):   Originally from [facility]. Has always lived in [place]. 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 No pets currently. No bird or mold exposure.     Medications:   Current Outpatient Medications on File Prior to Visit  Medication Sig Dispense Refill   . ALLERGY RELIEF 180 MG tablet TAKE 1 TABLET ONCE DAILY AS NEEDED FOR ALLERGIES. 30 tablet 5  . amLODipine (NORVASC) 5 MG tablet Take 1 tablet (5 mg total) by mouth daily. 30 tablet 5  . aspirin EC 325 MG tablet Take 1 tablet (325 mg total) by mouth daily. 100 tablet 3  . atorvastatin (LIPITOR) 80 MG tablet TAKE (1) TABLET BY MOUTH ONCE DAILY. 30 tablet 5  . Calcium Carbonate-Vit D-Min (CALTRATE 600+D PLUS MINERALS) 600-800 MG-UNIT CHEW CHEW AND SWALLOW (1) TABLET DAILY. 30 tablet PRN  . Calcium Carbonate-Vitamin D 600-400 MG-UNIT per tablet Take 1 tablet by mouth daily.    . fexofenadine (ALLEGRA) 180 MG tablet Take 180 mg by mouth as needed.    Marland Kitchen FORACARE PREMIUM V10 TEST test strip USE AS DIRECTED. 50 each 0  . metFORMIN (GLUCOPHAGE) 500 MG tablet Take 2 tablets (1,000 mg total) by mouth 2 (two) times daily with a meal. TAKE 2 TABLETS ONCE DAILY WITH FOOD. 120 tablet 5  . metFORMIN (GLUCOPHAGE-XR) 500 MG 24 hr tablet TAKE 2 TABLETS TWICE DAILY WITH MEALS. 120 tablet 5  . Multiple Vitamin (MULTIVITAMIN) tablet Take 1 tablet by mouth daily.    . nitroGLYCERIN (NITROSTAT) 0.4 MG SL tablet PLACE ONE (1) TABLET UNDER TONGUE EVERY 5 MINUTES UP TO (3) DOSES AS NEEDED FOR CHEST PAIN. 25 tablet 0  . nortriptyline (PAMELOR) 10 MG capsule 2 nightly, may have 1 additional as needed 90 capsule 5  . pregabalin (LYRICA) 100 MG capsule TAKE 1 CAPSULE IN THE MORNING AND 2 CAPSULES AT BEDTIME. 90 capsule 5  . QC ASPIRIN 325 MG tablet TAKE 1 TABLET ONCE DAILY. 30 tablet PRN  . rosuvastatin (CRESTOR) 40 MG tablet Take 1 tablet (40 mg total) by mouth daily. 30 tablet 5   No current facility-administered medications on file prior to visit.     Allergies:   Allergies  Allergen Reactions  . Broccoli [Brassica Oleracea Italica] Nausea And Vomiting  . Celery Oil Nausea And Vomiting    Physical Exam General: well developed, well nourished elderly Caucasian male, seated, in no evident distress Head: head  normocephalic and atraumatic.   Neck: supple with no carotid or supraclavicular bruits Cardiovascular: regular rate and rhythm, no murmurs Musculoskeletal: no deformity Skin:  no rash/petichiae Vascular:  Normal pulses all extremities  Neurologic Exam Mental Status: Awake and fully alert. Oriented to place and time. Recent and remote memory intact. Attention span, concentration and fund of knowledge appropriate. Mood and affect appropriate.  Diminished recall 1/3.  Able to name 8 animals with 4 legs. Cranial Nerves: Fundoscopic exam  reveals sharp disc margins. Pupils equal, briskly reactive to light. Extraocular movements full without nystagmus. Visual fields full to confrontation. Hearing intact. Facial sensation intact.  Mild left nasolabial fold asymmetry., tongue, palate moves normally and symmetrically.  Motor: Normal bulk and tone. Normal strength in all tested extremity muscles.  Diminished fine finger movements on the left.  Mild left grip weakness.  Orbits right over left upper extremity. Sensory.: intact to touch , pinprick , position and vibratory sensation.  Coordination: Rapid alternating movements normal in all extremities. Finger-to-nose and heel-to-shin performed accurately bilaterally. Gait and Station: Arises from chair without difficulty. Stance is normal. Gait demonstrates normal stride length and balance . Able to heel, toe and tandem walk with slight t difficulty.  Reflexes: 1+ and symmetric. Toes downgoing.   NIHSS 1 Modified Rankin  1  ASSESSMENT: 87 year pleasant Caucasian male with right basal ganglia lacunar infarct in March 2019 secondary to small vessel disease.  Vascular risk factors of hypertension, hyperlipidemia, diabetes and sleep apnea    PLAN: I had a long d/w patient about his recent stroke, risk for recurrent stroke/TIAs, personally independently reviewed imaging studies and stroke evaluation results and answered questions.Continue aspirin 325 mg daily   for secondary stroke prevention and maintain strict control of hypertension with blood pressure goal below 130/90, diabetes with hemoglobin A1c goal below 6.5% and lipids with LDL cholesterol goal below 70 mg/dL. I also advised the patient to eat a healthy diet with plenty of whole grains, cereals, fruits and vegetables, exercise regularly and maintain ideal body weight.. He was advised to keep his upcoming appointment at sleep clinic and to be compliant with using his CPAP.  Greater than 50% time during this 45-minute consultation visit was spent on counseling and coordination of care about his lacunar infarct and discussion about prevention and treatment options.  Consider participation in [place] study if inetersted Followup in the future with my nurse practitioner Janett Billow in 3 months or call earlier if necessary. Antony Contras, MD  [facility] [address] [place], [place] [address]  Phone [phone] Fax [phone] Note: This document was prepared with digital dictation and possible smart phrase technology. Any transcriptional errors that result from this process are unintentional.

## 2018-04-13 NOTE — Patient Instructions (Signed)
I had a long d/w patient about his recent stroke, risk for recurrent stroke/TIAs, personally independently reviewed imaging studies and stroke evaluation results and answered questions.Continue aspirin 325 mg daily  for secondary stroke prevention and maintain strict control of hypertension with blood pressure goal below 130/90, diabetes with hemoglobin A1c goal below 6.5% and lipids with LDL cholesterol goal below 70 mg/dL. I also advised the patient to eat a healthy diet with plenty of whole grains, cereals, fruits and vegetables, exercise regularly and maintain ideal body weight.Consider participation in [place] study if inetersted Followup in the future with my nurse practitioner Janett Billow in 3 months or call earlier if necessary.   Stroke Prevention Some medical conditions and behaviors are associated with a higher chance of having a stroke. You can help prevent a stroke by making nutrition, lifestyle, and other changes, including managing any medical conditions you may have. What nutrition changes can be made?  Eat healthy foods. You can do this by: ? Choosing foods high in fiber, such as fresh fruits and vegetables and whole grains. ? Eating at least 5 or more servings of fruits and vegetables a day. Try to fill half of your plate at each meal with fruits and vegetables. ? Choosing lean protein foods, such as lean cuts of meat, poultry without skin, fish, tofu, beans, and nuts. ? Eating low-fat dairy products. ? Avoiding foods that are high in salt (sodium). This can help lower blood pressure. ? Avoiding foods that have saturated fat, trans fat, and cholesterol. This can help prevent high cholesterol. ? Avoiding processed and premade foods.  Follow your health care provider's specific guidelines for losing weight, controlling high blood pressure (hypertension), lowering high cholesterol, and managing diabetes. These may include: ? Reducing your daily calorie intake. ? Limiting your daily  sodium intake to 1,500 milligrams (mg). ? Using only healthy fats for cooking, such as olive oil, canola oil, or sunflower oil. ? Counting your daily carbohydrate intake. What lifestyle changes can be made?  Maintain a healthy weight. Talk to your health care provider about your ideal weight.  Get at least 30 minutes of moderate physical activity at least 5 days a week. Moderate activity includes brisk walking, biking, and swimming.  Do not use any products that contain nicotine or tobacco, such as cigarettes and e-cigarettes. If you need help quitting, ask your health care provider. It may also be helpful to avoid exposure to secondhand smoke.  Limit alcohol intake to no more than 1 drink a day for nonpregnant women and 2 drinks a day for men. One drink equals 12 oz of beer, 5 oz of wine, or 1 oz of hard liquor.  Stop any illegal drug use.  Avoid taking birth control pills. Talk to your health care provider about the risks of taking birth control pills if: ? You are over 65 years old. ? You smoke. ? You get migraines. ? You have ever had a blood clot. What other changes can be made?  Manage your cholesterol levels. ? Eating a healthy diet is important for preventing high cholesterol. If cholesterol cannot be managed through diet alone, you may also need to take medicines. ? Take any prescribed medicines to control your cholesterol as told by your health care provider.  Manage your diabetes. ? Eating a healthy diet and exercising regularly are important parts of managing your blood sugar. If your blood sugar cannot be managed through diet and exercise, you may need to take medicines. ? Take any prescribed  medicines to control your diabetes as told by your health care provider.  Control your hypertension. ? To reduce your risk of stroke, try to keep your blood pressure below 130/80. ? Eating a healthy diet and exercising regularly are an important part of controlling your blood  pressure. If your blood pressure cannot be managed through diet and exercise, you may need to take medicines. ? Take any prescribed medicines to control hypertension as told by your health care provider. ? Ask your health care provider if you should monitor your blood pressure at home. ? Have your blood pressure checked every year, even if your blood pressure is normal. Blood pressure increases with age and some medical conditions.  Get evaluated for sleep disorders (sleep apnea). Talk to your health care provider about getting a sleep evaluation if you snore a lot or have excessive sleepiness.  Take over-the-counter and prescription medicines only as told by your health care provider. Aspirin or blood thinners (antiplatelets or anticoagulants) may be recommended to reduce your risk of forming blood clots that can lead to stroke.  Make sure that any other medical conditions you have, such as atrial fibrillation or atherosclerosis, are managed. What are the warning signs of a stroke? The warning signs of a stroke can be easily remembered as BEFAST.  B is for balance. Signs include: ? Dizziness. ? Loss of balance or coordination. ? Sudden trouble walking.  E is for eyes. Signs include: ? A sudden change in vision. ? Trouble seeing.  F is for face. Signs include: ? Sudden weakness or numbness of the face. ? The face or eyelid drooping to one side.  A is for arms. Signs include: ? Sudden weakness or numbness of the arm, usually on one side of the body.  S is for speech. Signs include: ? Trouble speaking (aphasia). ? Trouble understanding.  T is for time. ? These symptoms may represent a serious problem that is an emergency. Do not wait to see if the symptoms will go away. Get medical help right away. Call your local emergency services (911 in the [place] Do not drive yourself to the hospital.  Other signs of stroke may include: ? A sudden, severe headache with no known  cause. ? Nausea or vomiting. ? Seizure.  Where to find more information: For more information, visit:  American Stroke Association: www.strokeassociation.org  National Stroke Association: www.stroke.org  Summary  You can prevent a stroke by eating healthy, exercising, not smoking, limiting alcohol intake, and managing any medical conditions you may have.  Do not use any products that contain nicotine or tobacco, such as cigarettes and e-cigarettes. If you need help quitting, ask your health care provider. It may also be helpful to avoid exposure to secondhand smoke.  Remember BEFAST for warning signs of stroke. Get help right away if you or a loved one has any of these signs. This information is not intended to replace advice given to you by your health care provider. Make sure you discuss any questions you have with your health care provider. Document Released: 12/30/2004 Document Revised: 12/28/2016 Document Reviewed: 12/28/2016 Elsevier Interactive Patient Education  [employer].

## 2018-04-17 DIAGNOSIS — H40033 Anatomical narrow angle, bilateral: Secondary | ICD-10-CM | POA: Diagnosis not present

## 2018-04-17 DIAGNOSIS — H43813 Vitreous degeneration, bilateral: Secondary | ICD-10-CM | POA: Diagnosis not present

## 2018-04-17 DIAGNOSIS — Z961 Presence of intraocular lens: Secondary | ICD-10-CM | POA: Diagnosis not present

## 2018-04-17 DIAGNOSIS — E119 Type 2 diabetes mellitus without complications: Secondary | ICD-10-CM | POA: Diagnosis not present

## 2018-04-17 DIAGNOSIS — H40013 Open angle with borderline findings, low risk, bilateral: Secondary | ICD-10-CM | POA: Diagnosis not present

## 2018-04-17 DIAGNOSIS — H02834 Dermatochalasis of left upper eyelid: Secondary | ICD-10-CM | POA: Diagnosis not present

## 2018-04-17 DIAGNOSIS — H02831 Dermatochalasis of right upper eyelid: Secondary | ICD-10-CM | POA: Diagnosis not present

## 2018-04-18 ENCOUNTER — Ambulatory Visit: Payer: Medicare Other | Admitting: Family Medicine

## 2018-04-20 DIAGNOSIS — I10 Essential (primary) hypertension: Secondary | ICD-10-CM | POA: Diagnosis not present

## 2018-04-21 LAB — BASIC METABOLIC PANEL
BUN/Creatinine Ratio: 21 (ref 10–24)
BUN: 27 mg/dL (ref 8–27)
CALCIUM: 9.4 mg/dL (ref 8.6–10.2)
CHLORIDE: 101 mmol/L (ref 96–106)
CO2: 25 mmol/L (ref 20–29)
Creatinine, Ser: 1.27 mg/dL (ref 0.76–1.27)
GFR calc non Af Amer: 52 mL/min/{1.73_m2} — ABNORMAL LOW (ref >59)
GFR, EST AFRICAN AMERICAN: 60 mL/min/{1.73_m2} (ref >59)
Glucose: 154 mg/dL — ABNORMAL HIGH (ref 65–99)
Potassium: 4.3 mmol/L (ref 3.5–5.2)
SODIUM: 141 mmol/L (ref 134–144)

## 2018-04-26 ENCOUNTER — Institutional Professional Consult (permissible substitution): Payer: Medicare Other | Admitting: Neurology

## 2018-04-26 DIAGNOSIS — M79672 Pain in left foot: Secondary | ICD-10-CM | POA: Diagnosis not present

## 2018-04-26 DIAGNOSIS — L609 Nail disorder, unspecified: Secondary | ICD-10-CM | POA: Diagnosis not present

## 2018-04-26 DIAGNOSIS — M79671 Pain in right foot: Secondary | ICD-10-CM | POA: Diagnosis not present

## 2018-04-26 DIAGNOSIS — E1140 Type 2 diabetes mellitus with diabetic neuropathy, unspecified: Secondary | ICD-10-CM | POA: Diagnosis not present

## 2018-04-27 ENCOUNTER — Ambulatory Visit (INDEPENDENT_AMBULATORY_CARE_PROVIDER_SITE_OTHER): Payer: Medicare Other | Admitting: Otolaryngology

## 2018-04-27 ENCOUNTER — Encounter: Payer: Self-pay | Admitting: Family Medicine

## 2018-04-27 ENCOUNTER — Ambulatory Visit (INDEPENDENT_AMBULATORY_CARE_PROVIDER_SITE_OTHER): Payer: Medicare Other | Admitting: Family Medicine

## 2018-04-27 VITALS — BP 132/74 | Ht 74.0 in | Wt 162.0 lb

## 2018-04-27 DIAGNOSIS — E7849 Other hyperlipidemia: Secondary | ICD-10-CM

## 2018-04-27 DIAGNOSIS — E1140 Type 2 diabetes mellitus with diabetic neuropathy, unspecified: Secondary | ICD-10-CM

## 2018-04-27 DIAGNOSIS — I10 Essential (primary) hypertension: Secondary | ICD-10-CM | POA: Diagnosis not present

## 2018-04-27 DIAGNOSIS — I6381 Other cerebral infarction due to occlusion or stenosis of small artery: Secondary | ICD-10-CM

## 2018-04-27 NOTE — Progress Notes (Signed)
Subjective:    Patient ID: Luke Solis, male    DOB: 08/04/1931, 84 y.o.   MRN: [id number]  Diabetes  He presents for his follow-up diabetic visit. He has type 2 diabetes mellitus. Pertinent negatives for hypoglycemia include no confusion, dizziness or headaches. Pertinent negatives for diabetes include no chest pain, no fatigue, no polydipsia, no polyphagia and no weakness. He is compliant with treatment all of the time. He is following a generally healthy diet. Home blood sugar record trend: 140's. He sees a podiatrist.Eye exam is current.   A1C done on bloodwork 7.2 on 03/01/18 in hospital.   Pt states no concerns today. Pt's daughter states he is allowed to drive now.  Patient here for follow-up from stroke Has not had any new stroke symptoms States his focus is doing better   Two different strengths of metformin on med list. Metformin 500mg  2 bid sent in on 03/23/18 and metformin 24 hour tablet 500mg  2 bid sent in on 03/30/18.  The pharmacy was called medication list cleared up  Pt not taking atorvastatin that is on med list, he is now taking crestor.  He is tolerating this medication well  Denies getting dizzy with standing up.  Tolerating blood pressure medicine well Try to watch his diet Still sleeps on the first floor but does walk up and down the steps has had no falls there but did have a fall [place] was [place] the [place]    Review of Systems  Constitutional: Negative for activity change, appetite change and fatigue.  HENT: Negative for congestion and rhinorrhea.   Respiratory: Negative for cough, chest tightness and shortness of breath.   Cardiovascular: Negative for chest pain and leg swelling.  Gastrointestinal: Negative for abdominal pain, diarrhea and nausea.  Endocrine: Negative for polydipsia and polyphagia.  Genitourinary: Negative for dysuria and hematuria.  Neurological: Negative for dizziness, weakness and headaches.  Psychiatric/Behavioral: Negative for agitation,  confusion and dysphoric mood.       Objective:   Physical Exam  Constitutional: He appears well-nourished. No distress.  HENT:  Head: Normocephalic and atraumatic.  Eyes: Right eye exhibits no discharge. Left eye exhibits no discharge.  Neck: No tracheal deviation present.  Cardiovascular: Normal rate, regular rhythm and normal heart sounds.  No murmur heard. Pulmonary/Chest: Effort normal and breath sounds normal. No respiratory distress.  Musculoskeletal: He exhibits no edema.  Lymphadenopathy:    He has no cervical adenopathy.  Neurological: He is alert. He exhibits normal muscle tone.  Psychiatric: His behavior is normal.  Vitals reviewed.   Has diabetic neuropathy Is able to walk fairly well but does drag his feet to some degree Blood pressure sitting standing slight drop but not severe Diabetic foot exam shows diabetic neuropathy [place] cognitive exam shows 25 out of 30 which is an improvement  25 minutes was spent with the patient.  This statement verifies that 25 minutes was indeed spent with the patient. Greater than half the time was spent in discussion, counseling and answering questions  regarding the issues that the patient came in for today as reflected in the diagnosis (s) please refer to documentation for further details.     Assessment & Plan:  Hyperlipidemia do check labs at the end of June await the results of this continue current medication previous labs reviewed  Blood pressure does not drop appreciably continue current dosing  Stroke-no signs of any new stroke he is regaining his cognitive function his Luke Solis cognitive assessment is now up  to a 25 out of 30 He is able to walk much better we will monitor him closely for any changes consistent with the possibility of developing Parkinson's  Type 2 diabetes decent control does need to do a better job watching diet and check lab work at the end of June and follow-up here in 3 months  The patient may  drive but I recommend the patient only do local driving within 30 mile radius of home

## 2018-05-03 ENCOUNTER — Encounter: Payer: Self-pay | Admitting: Neurology

## 2018-05-03 ENCOUNTER — Ambulatory Visit (INDEPENDENT_AMBULATORY_CARE_PROVIDER_SITE_OTHER): Payer: Medicare Other | Admitting: Neurology

## 2018-05-03 VITALS — BP 129/81 | HR 68 | Ht 74.0 in | Wt 159.0 lb

## 2018-05-03 DIAGNOSIS — I6381 Other cerebral infarction due to occlusion or stenosis of small artery: Secondary | ICD-10-CM | POA: Diagnosis not present

## 2018-05-03 DIAGNOSIS — G4733 Obstructive sleep apnea (adult) (pediatric): Secondary | ICD-10-CM | POA: Diagnosis not present

## 2018-05-03 DIAGNOSIS — G4719 Other hypersomnia: Secondary | ICD-10-CM

## 2018-05-03 DIAGNOSIS — R351 Nocturia: Secondary | ICD-10-CM

## 2018-05-03 DIAGNOSIS — Z8673 Personal history of transient ischemic attack (TIA), and cerebral infarction without residual deficits: Secondary | ICD-10-CM

## 2018-05-03 NOTE — Progress Notes (Signed)
Subjective:    Patient ID: Luke Solis is a 84 y.o. male.  HPI     [name], MD, PhD [facility] [address], [address] [place], [place] [address]  Dear Dr. [name],   I saw your patient, Luke Solis, upon your kind request in my neurologic clinic today for initial consultation of his sleep disorder, in particular, evaluation of his obstructive sleep apnea and daytime somnolence. The patient is accompanied by his wife today. As you know, Luke Solis is an 84 year old right-handed Solis with an underlying medical history of stroke (followed by Dr. [name]), prostate cancer, diabetes, neuropathy, hyperlipidemia, non-STEMI, and hypertension, who was previously diagnosed with obstructive sleep apnea previously followed with Dr. [name] in pulmonology for this. His last office visit for sleep apnea management was in October 2018 and I reviewed that note. He was recently restarted on CPAP therapy. Sleep study testing was in or around 2006. He was having trouble tolerating the mask and pressure. His residual AHI was also elevated at 18.8, he was on AutoPap. Prior sleep study results are not available for my review today. He has an older CPAP machine. I reviewed his most recent compliance data from the past 90 days. Of note, he has not been using his CPAP since approximately 02/26/2018. He reports that he has had to travel back and forth, his son recently had a heart attack. He has an AutoPap machine with a pressure range of 8 cm to 15 cm without EPR. 95th percentile pressure is 14.2, leak is extremely high with the 95th percentile at 102 L/m. Residual AHI is also increased at 13.6 per hour. He has used his AutoPap in the past 90 days for 34 days. Average usage for days on treatment is 4 hours and 53 minutes. He has daytime somnolence. He recently received parts for this machine. He is married and lives with his wife, he is retired. They have 4 children. He is a nonsmoker  and does not utilize alcohol and does not utilize caffeine on a regular basis. His Epworth sleepiness score is 24, fatigue score is 48 out of 63. He has some residual slowness in thinking since the stroke, not so much L sided weakness, also complains of overall fatigue. Blood sugars have been up lately, but A1c has been improving in the last 9 months. He is not sure that he wants to have another sleep study, he is not sure that he will be able to use CPAP. Nevertheless, he is willing to think about it. His wife reports that he has been sleepy for quite some time, unfortunately, sleep is also quite interrupted because of his significant nocturia. He reports that since his prostate cancer treatment he has to get up and use the bathroom at night several times, on average 5-6 times. Bedtime is around 11 and rise time around 8. He has been using a large F 20 full face mask. He has had daytime somnolence for years. This is why he had his first sleep study several years ago and when he was able to use CPAP he improved in terms of daytime somnolence. He has no severe sleepiness dating back to teenage years or college age. He has no family history of narcolepsy.  His Past Medical History Is Significant For: Past Medical History:  Diagnosis Date  . Coronary atherosclerosis of native coronary artery    BMS proximal and mid RCA as well as BMS circumflex - 2002 (had residual 70% distal  LAD), LVEF 60%  . Diabetes mellitus without complication ([place])   . Essential hypertension, benign   . Hyperlipidemia   . Neuropathy   . NSTEMI (non-ST elevated myocardial infarction) ([place])    2002  . Prostate cancer ([place])   . Sleep apnea    On CPAP  . Stroke [facility])     His Past Surgical History Is Significant For: Past Surgical History:  Procedure Laterality Date  . COLONOSCOPY    . INGUINAL HERNIA REPAIR    . KNEE SURGERY    . NOSE SURGERY    . PROSTATE SURGERY    . TONSILLECTOMY      His Family History Is  Significant For: Family History  Problem Relation Age of Onset  . Heart attack Mother   . Diabetes Mother   . Coronary artery disease Father   . Hypertension Father   . Heart attack Father   . Stroke Maternal Grandfather   . Lung disease Neg Hx   . Cancer Neg Hx     His Social History Is Significant For: Social History   Socioeconomic History  . Marital status: Married    Spouse name: Not on file  . Number of children: Not on file  . Years of education: Not on file  . Highest education level: Not on file  Occupational History  . Not on file  Social Needs  . Financial resource strain: Not on file  . Food insecurity:    Worry: Not on file    Inability: Not on file  . Transportation needs:    Medical: Not on file    Non-medical: Not on file  Tobacco Use  . Smoking status: Never Smoker  . Smokeless tobacco: Never Used  Substance and Sexual Activity  . Alcohol use: No    Alcohol/week: 0.0 oz  . Drug use: No  . Sexual activity: Not on file  Lifestyle  . Physical activity:    Days per week: Not on file    Minutes per session: Not on file  . Stress: Not on file  Relationships  . Social connections:    Talks on phone: Not on file    Gets together: Not on file    Attends religious service: Not on file    Active member of club or organization: Not on file    Attends meetings of clubs or organizations: Not on file    Relationship status: Not on file  Other Topics Concern  . Not on file  Social History Narrative   [facility] (08/03/17):   Originally from [facility]. Has always lived in [place]. 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 No pets currently. No bird or mold exposure.     His Allergies Are:  Allergies  Allergen Reactions  . Broccoli [Brassica Oleracea Italica] Nausea And Vomiting  . Celery Oil Nausea And Vomiting  :   His Current Medications Are:  Outpatient Encounter Medications as of 05/03/2018  Medication Sig  . ALLERGY RELIEF 180 MG  tablet TAKE 1 TABLET ONCE DAILY AS NEEDED FOR ALLERGIES.  Marland Kitchen aspirin EC 325 MG tablet Take 1 tablet (325 mg total) by mouth daily.  . Calcium Carbonate-Vit D-Min (CALTRATE 600+D PLUS MINERALS) 600-800 MG-UNIT CHEW CHEW AND SWALLOW (1) TABLET DAILY.  . Calcium Carbonate-Vitamin D 600-400 MG-UNIT per tablet Take 1 tablet by mouth daily.  Marland Kitchen FORACARE PREMIUM V10 TEST test strip USE AS DIRECTED.  Marland Kitchen metFORMIN (GLUCOPHAGE-XR) 500 MG 24 hr tablet TAKE 2 TABLETS  TWICE DAILY WITH MEALS.  . Multiple Vitamin (MULTIVITAMIN) tablet Take 1 tablet by mouth daily.  . nitroGLYCERIN (NITROSTAT) 0.4 MG SL tablet PLACE ONE (1) TABLET UNDER TONGUE EVERY 5 MINUTES UP TO (3) DOSES AS NEEDED FOR CHEST PAIN.  Marland Kitchen nortriptyline (PAMELOR) 10 MG capsule 2 nightly, may have 1 additional as needed  . pregabalin (LYRICA) 100 MG capsule TAKE 1 CAPSULE IN THE MORNING AND 2 CAPSULES AT BEDTIME.  Marland Kitchen QC ASPIRIN 325 MG tablet TAKE 1 TABLET ONCE DAILY.  . rosuvastatin (CRESTOR) 40 MG tablet Take 1 tablet (40 mg total) by mouth daily.  Marland Kitchen amLODipine (NORVASC) 5 MG tablet Take 1 tablet (5 mg total) by mouth daily.  . [DISCONTINUED] fexofenadine (ALLEGRA) 180 MG tablet Take 180 mg by mouth as needed.   No facility-administered encounter medications on file as of 05/03/2018.   :  Review of Systems:  Out of a complete 14 point review of systems, all are reviewed and negative with the exception of these symptoms as listed below: Review of Systems  Neurological:       Pt presents today to discuss his sleep. Pt reports that he can fall asleep anywhere. Pt is not currently using his cpap. His machine came from [facility] but he is getting supplies from [facility].  Epworth Sleepiness Scale 0= would never doze 1= slight chance of dozing 2= moderate chance of dozing 3= high chance of dozing  Sitting and reading: 3 Watching TV: 3 Sitting inactive in a public place (ex. Theater or meeting): 1 As a passenger in a car for an hour  without a break: 2 Lying down to rest in the afternoon: 1 Sitting and talking to someone: 1 Sitting quietly after lunch (no alcohol): 2 In a car, while stopped in traffic: 1 Total: 11     Objective:  Neurological Exam  Physical Exam Physical Examination:   Vitals:   05/03/18 1138  BP: 129/81  Pulse: 68    General Examination: The patient is a very pleasant 84 y.o. male in no acute distress. He appears frail, minimally verbal.   HEENT: Normocephalic, atraumatic, pupils are equal, round and reactive to light and accommodation. Extraocular tracking is fair. Corrective eyeglasses in place, hearing is grossly intact. He has no obvious facial weakness, no dysarthria. He has some slowness in his speech. Airway examination reveals mild airway crowding, tonsils absent, Mallampati is class II, redundant soft palate. Neck circumference is 15-5/8 inches.  Chest: Clear to auscultation without wheezing, rhonchi or crackles noted.  Heart: S1+S2+0, regular and normal without murmurs, rubs or gallops noted.   Abdomen: Soft, non-tender and non-distended with normal bowel sounds appreciated on auscultation.  Extremities: There is no pitting edema in the distal lower extremities bilaterally.   Skin: Warm and dry without trophic changes noted.  Musculoskeletal: exam reveals no obvious joint deformities, tenderness or joint swelling or erythema.   Neurologically:  Mental status: The patient is awake, alert and oriented in all 4 spheres. His immediate and remote memory, attention, language skills and fund of knowledge are mildly impaired. His wife provides most of his history. Mood is normal and affect is normal.  Cranial nerves II - XII are as described above under HEENT exam. In addition: shoulder shrug is normal with equal shoulder height noted. Motor exam: in bulk, global strength of about 45, no obvious tremor. Fine motor skills are globally mildly impaired. Cerebellar testing: No dysmetria or  intention tremor noted. There is no truncal or gait ataxia.  Sensory exam: intact to light touch.  Gait, station and balance: He stands With mild difficulty. He has a mildly stooped posture for age, he walks slowly and cautiously. He has no walking aid.  Assessment and Plan:  In summary, Luke Solis is a very pleasant 84 y.o.-year old male with an underlying medical history of stroke (followed by Dr. [name]), prostate cancer, diabetes, neuropathy, hyperlipidemia, non-STEMI, and hypertension, who Presents for sleep consultation. He has a prior diagnosis of obstructive sleep apnea and his current machine and settings are not adequate. His mask is also leaking significantly. He will benefit from reevaluation with a sleep study and adjustment of his treatment settings for his sleep apnea. He may qualify for a new CPAP machine as well. Especially in light of his history of heart disease and recent stroke history he is advised to proceed with sleep apnea reevaluation and treatment. I had a long chat with the patient and his wife about my findings and the diagnosis of OSA, its prognosis and treatment options. We talked about medical treatments, surgical interventions and non-pharmacological approaches. I explained in particular the risks and ramifications of untreated moderate to severe OSA, especially with respect to developing cardiovascular disease down the Road, including congestive heart failure, difficult to treat hypertension, cardiac arrhythmias, or stroke. Even type 2 diabetes has, in part, been linked to untreated OSA. Symptoms of untreated OSA include daytime sleepiness, memory problems, mood irritability and mood disorder such as depression and anxiety, lack of energy, as well as recurrent headaches, especially morning headaches. We talked about trying to maintain a healthy lifestyle in general. In particular, he is encouraged to increase his water intake. He likes to drink diet Pepsi, usually caffeine  free and he, also decaf but very little water on a daily basis. I recommended the following at this time: sleep study with potential positive airway pressure titration. (We will score hypopneas at 4%).he would like to think about it. We will proceed with seeking insurance authorization for the test and call him to schedule at his convenience. His wife will be driving him.   I explained the sleep test procedure to the patient and also outlined the CPAP treatment option to the patient, who has only been on AutoPap therapy as I understand. I answered all their questions today and the patient and his wife were in agreement. I would like to see him back after the sleep study is completed and encouraged them to call with any interim questions, concerns, problems or updates.   Thank you very much for allowing me to participate in the care of this nice patient. If I can be of any further assistance to you please do not hesitate to call me at [phone].  Sincerely,   [name], MD, PhD

## 2018-05-03 NOTE — Patient Instructions (Signed)

## 2018-05-22 ENCOUNTER — Ambulatory Visit (INDEPENDENT_AMBULATORY_CARE_PROVIDER_SITE_OTHER): Payer: Medicare Other | Admitting: Neurology

## 2018-05-22 DIAGNOSIS — G4733 Obstructive sleep apnea (adult) (pediatric): Secondary | ICD-10-CM | POA: Diagnosis not present

## 2018-05-22 DIAGNOSIS — G4761 Periodic limb movement disorder: Secondary | ICD-10-CM

## 2018-05-22 DIAGNOSIS — G4720 Circadian rhythm sleep disorder, unspecified type: Secondary | ICD-10-CM

## 2018-05-22 DIAGNOSIS — R351 Nocturia: Secondary | ICD-10-CM

## 2018-05-22 DIAGNOSIS — Z8673 Personal history of transient ischemic attack (TIA), and cerebral infarction without residual deficits: Secondary | ICD-10-CM

## 2018-05-22 DIAGNOSIS — G4719 Other hypersomnia: Secondary | ICD-10-CM

## 2018-05-23 ENCOUNTER — Ambulatory Visit: Payer: Medicare Other | Admitting: Neurology

## 2018-05-23 ENCOUNTER — Telehealth: Payer: Self-pay

## 2018-05-23 NOTE — Addendum Note (Signed)
Addended by: [name] on: 05/23/2018 08:43 AM   Modules accepted: Orders

## 2018-05-23 NOTE — Telephone Encounter (Signed)
-----   Message from [name], MD sent at 05/23/2018  8:43 AM EDT ----- Patient referred by Dr. [name], seen by me on 05/03/18, split night sleep study on 05/22/18. Please call and notify patient that the recent sleep study confirmed the diagnosis of severe OSA. He did well with CPAP during the study with significant improvement of the respiratory events. Therefore, I would like start the patient on CPAP therapy at home by prescribing a machine for home use. I placed the order in the chart. He may have a DME company already. Please advise patient that we need a follow up appointment with either myself or one of our nurse practitioners in about 10 weeks post set-up to check for how the patient is feeling and how well the patient is using the machine, etc. Please go ahead and schedule the appointment, while you have the patient on the phone and make sure patient understands the importance of keeping this window for the FU appointment, as it is often an insurance requirement. Failing to adhere to this may result in losing coverage for sleep apnea treatment, at which point most patients are left with a choice of returning the machine or paying out of pocket (and we want neither of this to happen!).  Please re-enforce the importance of compliance with treatment and the need for [place] to monitor compliance data - again an insurance requirement and usually a good feedback for the patient as far as how they are doing.  Also remind patient, that any PAP machine or mask issues should be first addressed with the DME company, who provided the machine/mask.  Please ask if patient has a preference regarding DME company, may depend on the insurance too.  Please arrange for CPAP set up at home through a DME company of patient's choice.  Once you have spoken to the patient you can close the phone encounter. Please fax/route report to referring provider, thanks,   [name], MD, PhD [facility])

## 2018-05-23 NOTE — Telephone Encounter (Signed)
I called pt to discuss his sleep study results. The home number listed just keeps ringing. Will try again later.

## 2018-05-23 NOTE — Procedures (Signed)
PATIENT'S NAME:  Luke Solis, Luke Solis DOB:      1931/03/27      MR#:    [id number]     DATE OF RECORDING: 05/22/2018 REFERRING M.D.:  Sallee Lange, MD Study Performed:  Split-Night Titration Study HISTORY: 84 year old man with a of stroke, prostate cancer, diabetes, neuropathy, hyperlipidemia, non-STEMI, and hypertension, who was previously diagnosed with obstructive sleep apnea. He recently restarted CPAP therapy, but has difficulty with it and residual AHI is elevated. He presents for re-evaluation and optimization of treatment settings. The patient endorsed the Epworth Sleepiness Scale at 11/24 points. BMI 20.4 kg/m2. The patient's neck circumference measured 15.8 inches.  CURRENT MEDICATIONS: Aspirin, Caltrate, Calcium carbonate, Glucophage, Multivitamin, Nitrostat, Pamelor, Lyrica, Crestor, Norvasc.  PROCEDURE:  This is a multichannel digital polysomnogram utilizing the Somnostar 11.2 system.  Electrodes and sensors were applied and monitored per AASM Specifications.   EEG, EOG, Chin and Limb EMG, were sampled at 200 Hz.  ECG, Snore and Nasal Pressure, Thermal Airflow, Respiratory Effort, CPAP Flow and Pressure, Oximetry was sampled at 50 Hz. Digital video and audio were recorded.      BASELINE STUDY WITHOUT CPAP RESULTS:  Lights Out was at 23:02 and Lights On at 05:08 for the night, split study started at 01:16, epoch 270. Total recording time (TRT) was 133.5, with a total sleep time (TST) of 122 minutes.   The patient's sleep latency was 6.5 minutes. REM latency was 67 minutes. The sleep efficiency was 91.4 %.    SLEEP ARCHITECTURE: WASO (Wake after sleep onset) was 3 minutes, Stage N1 was 2 minutes, Stage N2 was 87.5 minutes, Stage N3 was 8 minutes and Stage R (REM sleep) was 24.5 minutes.  The percentages were Stage N1 1.6%, Stage N2 71.7%, Stage N3 6.6% and Stage R (REM sleep) 20.1%. The arousals were noted as: 12 were spontaneous, 4 were associated with PLMs, 24 were associated with respiratory  events.  RESPIRATORY ANALYSIS:  There were a total of 81 respiratory events:  20 obstructive apneas, 8 central apneas and 0 mixed apneas with a total of 28 apneas and an apnea index (AI) of 13.8. There were 53 hypopneas with a hypopnea index of 26.1. The patient also had 0 respiratory event related arousals (RERAs).  Snoring was noted.     The total APNEA/HYPOPNEA INDEX (AHI) was 39.8/hour and the total RESPIRATORY DISTURBANCE INDEX was 39.8 /hour.  17 events occurred in REM sleep and 103 events in NREM. The REM AHI was 41.6, /hour versus a non-REM AHI of 39.4 /hour. The patient spent 346 minutes sleep time in the supine position 0 minutes in non-supine. The supine AHI was 39.9 /hour versus a non-supine AHI of 0.0 /hour.  OXYGEN SATURATION & C02:  The wake baseline 02 saturation was 93%, with the lowest being 77% (59% on automated report was error). Time spent below 89% saturation equaled 14 minutes (overestimated due to dislodged sensor). PERIODIC LIMB MOVEMENTS: The patient had a total of 31 Periodic Limb Movements.  The Periodic Limb Movement (PLM) index was 15.2 /hour and the PLM Arousal index was 2. /hour.  Audio and video analysis did not show any abnormal or unusual movements, behaviors, phonations or vocalizations. The patient took no bathroom breaks. Mild to moderate snoring was noted. The EKG was in keeping with normal sinus rhythm (NSR).   TITRATION STUDY WITH CPAP RESULTS:   The patient was fitted with a large Simplus FFM (due to mouth breathing noted), CPAP was initiated at 5 cmH20 with heated  humidity per AASM split night standards and pressure was advanced to 11 cmH20 because of hypopneas, apneas and desaturations.  At a PAP pressure of 11 cmH20, there was a reduction of the AHI to 0/hour, with supine REM sleep achieved and O2 nadir of 94%.   Total recording time (TRT) was 233 minutes, with a total sleep time (TST) of 224 minutes. The patient's sleep latency was 1 minutes. REM latency  was 47 minutes.  The sleep efficiency was 96.1 %.    SLEEP ARCHITECTURE: Wake after sleep was 7.5 minutes, Stage N1 2 minutes, Stage N2 70 minutes, Stage N3 68 minutes and Stage R (REM sleep) 84 minutes. The percentages were: Stage N1 .9%, Stage N2 31.3%, Stage N3 30.4%, which is increased, and Stage R (REM sleep) 37.5%, which is increased and likely in keeping with rebound. The arousals were noted as: 23 were spontaneous, 2 were associated with PLMs, 6 were associated with respiratory events.  RESPIRATORY ANALYSIS:  There were a total of 36 respiratory events: 6 obstructive apneas, 21 central apneas and 2 mixed apneas with a total of 29 apneas and an apnea index (AI) of 7.8. There were 7 hypopneas with a hypopnea index of 1.9 /hour. The patient also had 0 respiratory event related arousals (RERAs).      The total APNEA/HYPOPNEA INDEX  (AHI) was 9.6 /hour and the total RESPIRATORY DISTURBANCE INDEX was 9.6 /hour.  17 events occurred in REM sleep and 19 events in NREM. The REM AHI was 12.1 /hour versus a non-REM AHI of 8.1 /hour. REM sleep was achieved on a pressure of  cm/h2o (AHI was  .) The patient spent 100% of total sleep time in the supine position. The supine AHI was 9.7 /hour, versus a non-supine AHI of 0.0/hour.  OXYGEN SATURATION & C02:  The wake baseline 02 saturation was 94%, with the lowest being 85%. Time spent below 89% saturation equaled 0 minutes.  PERIODIC LIMB MOVEMENTS: The patient had a total of 46 Periodic Limb Movements. The Periodic Limb Movement (PLM) index was 12.3 /hour and the PLM Arousal index was .5 /hour.  Post-study, the patient indicated that sleep was better than usual.  POLYSOMNOGRAPHY IMPRESSION :   1. Obstructive Sleep Apnea (OSA)  2. Dysfunctions associated with sleep stages or arousals from sleep 3. Periodic Limb Movement Disorder (PLMD)  RECOMMENDATIONS:  1. This patient has severe obstructive sleep apnea and responded well on CPAP therapy. I will,  therefore, start the patient on home CPAP treatment at a pressure of 11 cm via large Simplus FFM, with heated humidity. The patient should be reminded to be fully compliant with PAP therapy to improve sleep related symptoms and decrease long term cardiovascular risks. Please note that untreated obstructive sleep apnea may carry additional perioperative morbidity. Patients with significant obstructive sleep apnea should receive perioperative PAP therapy and the surgeons and particularly the anesthesiologist should be informed of the diagnosis and the severity of the sleep disordered breathing. 2. Mild PLMs (periodic limb movements of sleep) were noted during this study with no significant arousals; clinical correlation is recommended. Medication effect from the antidepressant medication should be considered.  3. The patient should be cautioned not to drive, work at heights, or operate dangerous or heavy equipment when tired or sleepy. Review and reiteration of good sleep hygiene measures should be pursued with any patient. 4. The patient will be seen in follow-up in the sleep clinic at [facility] for discussion of the test results, symptom and treatment compliance  review, further management strategies, etc. The referring provider will be notified of the test results.  I certify that I have reviewed the entire raw data recording prior to the issuance of this report in accordance with the Standards of Accreditation of the American Academy of Sleep Medicine (AASM)   [name], MD, PhD Diplomat, American Board of Neurology and Sleep Medicine (Neurology and Sleep Medicine)

## 2018-05-23 NOTE — Progress Notes (Signed)
Patient referred by Dr. [name], seen by me on 05/03/18, split night sleep study on 05/22/18. Please call and notify patient that the recent sleep study confirmed the diagnosis of severe OSA. He did well with CPAP during the study with significant improvement of the respiratory events. Therefore, I would like start the patient on CPAP therapy at home by prescribing a machine for home use. I placed the order in the chart. He may have a DME company already. Please advise patient that we need a follow up appointment with either myself or one of our nurse practitioners in about 10 weeks post set-up to check for how the patient is feeling and how well the patient is using the machine, etc. Please go ahead and schedule the appointment, while you have the patient on the phone and make sure patient understands the importance of keeping this window for the FU appointment, as it is often an insurance requirement. Failing to adhere to this may result in losing coverage for sleep apnea treatment, at which point most patients are left with a choice of returning the machine or paying out of pocket (and we want neither of this to happen!).  Please re-enforce the importance of compliance with treatment and the need for [place] to monitor compliance data - again an insurance requirement and usually a good feedback for the patient as far as how they are doing.  Also remind patient, that any PAP machine or mask issues should be first addressed with the DME company, who provided the machine/mask.  Please ask if patient has a preference regarding DME company, may depend on the insurance too.  Please arrange for CPAP set up at home through a DME company of patient's choice.  Once you have spoken to the patient you can close the phone encounter. Please fax/route report to referring provider, thanks,   [name], MD, PhD [facility])

## 2018-05-25 NOTE — Telephone Encounter (Signed)
I called Luke Solis. I advised Luke Solis that Dr. Rexene Alberts reviewed their sleep study results and found that Luke Solis has severe osa, but did well during the latest sleep study with the cpap. Dr. Rexene Alberts recommends that Luke Solis start a cpap at home. Luke Solis's current machine is 44-82 years old, and I advised him that Medicare will cover a new cpap every 5 years. I reviewed PAP compliance expectations with the Luke Solis. Luke Solis is agreeable to starting a CPAP. I advised Luke Solis that an order will be sent to a DME, [place], and [place] will call the Luke Solis within about one week after they file with the Luke Solis's insurance. [facility] will show the Luke Solis how to use the machine, fit for masks, and troubleshoot the CPAP if needed. A follow up appt was made for insurance purposes with Dr. Rexene Alberts on 08/16/18 at 10:30am. Luke Solis verbalized understanding to arrive 15 minutes early and bring their CPAP. A letter with all of this information in it will be mailed to the Luke Solis as a reminder. I verified with the Luke Solis that the address we have on file is correct. Luke Solis verbalized understanding of results. Luke Solis had no questions at this time but was encouraged to call back if questions arise.

## 2018-06-05 ENCOUNTER — Emergency Department (HOSPITAL_COMMUNITY)
Admission: EM | Admit: 2018-06-05 | Discharge: 2018-06-05 | Disposition: A | Discharge status: Home / Self Care | Payer: No Typology Code available for payment source | Attending: Emergency Medicine | Admitting: Emergency Medicine

## 2018-06-05 ENCOUNTER — Encounter (HOSPITAL_COMMUNITY): Payer: Self-pay | Admitting: Emergency Medicine

## 2018-06-05 ENCOUNTER — Other Ambulatory Visit: Payer: Self-pay

## 2018-06-05 ENCOUNTER — Ambulatory Visit (INDEPENDENT_AMBULATORY_CARE_PROVIDER_SITE_OTHER): Payer: Medicare Other | Admitting: Otolaryngology

## 2018-06-05 DIAGNOSIS — I2510 Atherosclerotic heart disease of native coronary artery without angina pectoris: Secondary | ICD-10-CM | POA: Insufficient documentation

## 2018-06-05 DIAGNOSIS — R490 Dysphonia: Secondary | ICD-10-CM

## 2018-06-05 DIAGNOSIS — J382 Nodules of vocal cords: Secondary | ICD-10-CM

## 2018-06-05 DIAGNOSIS — E119 Type 2 diabetes mellitus without complications: Secondary | ICD-10-CM | POA: Diagnosis not present

## 2018-06-05 DIAGNOSIS — Z7984 Long term (current) use of oral hypoglycemic drugs: Secondary | ICD-10-CM | POA: Insufficient documentation

## 2018-06-05 DIAGNOSIS — I252 Old myocardial infarction: Secondary | ICD-10-CM | POA: Diagnosis not present

## 2018-06-05 DIAGNOSIS — Z79899 Other long term (current) drug therapy: Secondary | ICD-10-CM | POA: Insufficient documentation

## 2018-06-05 DIAGNOSIS — M25531 Pain in right wrist: Secondary | ICD-10-CM | POA: Diagnosis not present

## 2018-06-05 DIAGNOSIS — Z041 Encounter for examination and observation following transport accident: Secondary | ICD-10-CM | POA: Diagnosis not present

## 2018-06-05 DIAGNOSIS — M25532 Pain in left wrist: Secondary | ICD-10-CM | POA: Diagnosis not present

## 2018-06-05 DIAGNOSIS — I10 Essential (primary) hypertension: Secondary | ICD-10-CM | POA: Diagnosis not present

## 2018-06-05 DIAGNOSIS — Z7982 Long term (current) use of aspirin: Secondary | ICD-10-CM | POA: Insufficient documentation
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
## 2018-06-05 NOTE — ED Notes (Signed)
Patient denies any pain at this time. Patient conscious, alert and oriented.
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
## 2018-07-03 DIAGNOSIS — N3040 Irradiation cystitis without hematuria: Secondary | ICD-10-CM | POA: Diagnosis not present

## 2018-07-03 DIAGNOSIS — R9721 Rising PSA following treatment for malignant neoplasm of prostate: Secondary | ICD-10-CM | POA: Diagnosis not present

## 2018-07-03 DIAGNOSIS — R972 Elevated prostate specific antigen [PSA]: Secondary | ICD-10-CM | POA: Diagnosis not present

## 2018-07-03 DIAGNOSIS — N35014 Post-traumatic urethral stricture, male, unspecified: Secondary | ICD-10-CM | POA: Diagnosis not present

## 2018-07-03 DIAGNOSIS — C61 Malignant neoplasm of prostate: Secondary | ICD-10-CM | POA: Diagnosis not present

## 2018-07-03 DIAGNOSIS — N3946 Mixed incontinence: Secondary | ICD-10-CM | POA: Diagnosis not present

## 2018-07-17 NOTE — Progress Notes (Deleted)
[facility] [address] [place]. [place] [address] [phone]       OFFICE CONSULT NOTE  Luke. Luke Solis Date of Birth:  24-Jul-1931 Medical Record Number:  [id number]   Referring MD:  Sallee Lange Reason for Referral:  Stroke  HPI: Luke Solis is a 84 year old pleasant Caucasian male seen today for initial consultation visit for stroke.  He is accompanied by his wife.  History is obtained from them and review of electronic medical records.  I have personally reviewed imaging films.  He presented to [facility] on 02/28/2018 with a 3-day history of slurred speech and speech difficulties.  On exam he was found to have left facial weakness and slurred speech and MRI scan of the brain was obtained which showed right basal ganglia infarct.  MRI of the brain showed no large vessel stenosis occlusion.  Echocardiogram showed normal ejection fraction without cardiac source of embolism.  .Carotid ultrasound showed no significant extracranial stenosis.  LDL cholesterol was 80 mg percent.  Hemoglobin A1c was 7.2.  The patient states his speech improved quickly.  He was previously on aspirin 81 mg which was increased to 325 mg which is tolerating now well without bruising or bleeding.  He is also on Lipitor and tolerating it well.  His blood pressure is well controlled.  His sugars have also been under good control.  The patient has sleep apnea and has had trouble tolerating his CPAP and he plans to be seen in the sleep disorder clinic to have this looked at.  He denies any prior history of strokes TIAs or other significant neurological problems.  ROS:   14 system review of systems is positive for fatigue, ringing in the ears, hearing loss, incontinence, importance, joint pain and all other systems negative PMH:  Past Medical History:  Diagnosis Date  . Coronary atherosclerosis of native coronary artery    BMS proximal and mid RCA as well as BMS circumflex - 2002 (had residual 70%  distal LAD), LVEF 60%  . Diabetes mellitus without complication ([place])   . Essential hypertension, benign   . Hyperlipidemia   . Neuropathy   . NSTEMI (non-ST elevated myocardial infarction) ([place])    2002  . Prostate cancer ([place])   . Sleep apnea    On CPAP  . Stroke [facility])     Social History:  Social History   Socioeconomic History  . Marital status: Married    Spouse name: Not on file  . Number of children: Not on file  . Years of education: Not on file  . Highest education level: Not on file  Occupational History  . Not on file  Social Needs  . Financial resource strain: Not on file  . Food insecurity:    Worry: Not on file    Inability: Not on file  . Transportation needs:    Medical: Not on file    Non-medical: Not on file  Tobacco Use  . Smoking status: Never Smoker  . Smokeless tobacco: Never Used  Substance and Sexual Activity  . Alcohol use: No    Alcohol/week: 0.0 standard drinks  . Drug use: No  . Sexual activity: Not on file  Lifestyle  . Physical activity:    Days per week: Not on file    Minutes per session: Not on file  . Stress: Not on file  Relationships  . Social connections:    Talks on phone: Not on file    Gets together: Not  on file    Attends religious service: Not on file    Active member of club or organization: Not on file    Attends meetings of clubs or organizations: Not on file    Relationship status: Not on file  . Intimate partner violence:    Fear of current or ex partner: Not on file    Emotionally abused: Not on file    Physically abused: Not on file    Forced sexual activity: Not on file  Other Topics Concern  . Not on file  Social History Narrative   [facility] (08/03/17):   Originally from [facility]. Has always lived in [place]. 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 No pets currently. No bird or mold exposure.     Medications:   Current Outpatient Medications on File Prior to Visit  Medication Sig  Dispense Refill  . ALLERGY RELIEF 180 MG tablet TAKE 1 TABLET ONCE DAILY AS NEEDED FOR ALLERGIES. 30 tablet 5  . amLODipine (NORVASC) 5 MG tablet Take 1 tablet (5 mg total) by mouth daily. 30 tablet 5  . aspirin EC 325 MG tablet Take 1 tablet (325 mg total) by mouth daily. 100 tablet 3  . Calcium Carbonate-Vit D-Min (CALTRATE 600+D PLUS MINERALS) 600-800 MG-UNIT CHEW CHEW AND SWALLOW (1) TABLET DAILY. 30 tablet PRN  . Calcium Carbonate-Vitamin D 600-400 MG-UNIT per tablet Take 1 tablet by mouth daily.    [employer] FORACARE PREMIUM V10 TEST test strip USE AS DIRECTED. 50 each 0  . metFORMIN (GLUCOPHAGE-XR) 500 MG 24 hr tablet TAKE 2 TABLETS TWICE DAILY WITH MEALS. 120 tablet 5  . Multiple Vitamin (MULTIVITAMIN) tablet Take 1 tablet by mouth daily.    . nitroGLYCERIN (NITROSTAT) 0.4 MG SL tablet PLACE ONE (1) TABLET UNDER TONGUE EVERY 5 MINUTES UP TO (3) DOSES AS NEEDED FOR CHEST PAIN. 25 tablet 0  . nortriptyline (PAMELOR) 10 MG capsule 2 nightly, may have 1 additional as needed 90 capsule 5  . pregabalin (LYRICA) 100 MG capsule TAKE 1 CAPSULE IN THE MORNING AND 2 CAPSULES AT BEDTIME. 90 capsule 5  . QC ASPIRIN 325 MG tablet TAKE 1 TABLET ONCE DAILY. 30 tablet PRN  . rosuvastatin (CRESTOR) 40 MG tablet Take 1 tablet (40 mg total) by mouth daily. 30 tablet 5   No current facility-administered medications on file prior to visit.     Allergies:   Allergies  Allergen Reactions  . Broccoli [Brassica Oleracea Italica] Nausea And Vomiting  . Celery Oil Nausea And Vomiting    Physical Exam General: well developed, well nourished elderly Caucasian male, seated, in no evident distress Head: head normocephalic and atraumatic.   Neck: supple with no carotid or supraclavicular bruits Cardiovascular: regular rate and rhythm, no murmurs Musculoskeletal: no deformity Skin:  no rash/petichiae Vascular:  Normal pulses all extremities  Neurologic Exam Mental Status: Awake and fully alert. Oriented to place  and time. Recent and remote memory intact. Attention span, concentration and fund of knowledge appropriate. Mood and affect appropriate.  Diminished recall 1/3.  Able to name 8 animals with 4 legs. Cranial Nerves: Fundoscopic exam reveals sharp disc margins. Pupils equal, briskly reactive to light. Extraocular movements full without nystagmus. Visual fields full to confrontation. Hearing intact. Facial sensation intact.  Mild left nasolabial fold asymmetry., tongue, palate moves normally and symmetrically.  Motor: Normal bulk and tone. Normal strength in all tested extremity muscles.  Diminished fine finger movements on the left.  Mild left grip weakness.  Orbits right over left upper extremity. Sensory.: intact to touch , pinprick , position and vibratory sensation.  Coordination: Rapid alternating movements normal in all extremities. Finger-to-nose and heel-to-shin performed accurately bilaterally. Gait and Station: Arises from chair without difficulty. Stance is normal. Gait demonstrates normal stride length and balance . Able to heel, toe and tandem walk with slight t difficulty.  Reflexes: 1+ and symmetric. Toes downgoing.   NIHSS 1 Modified Rankin  1  ASSESSMENT: 36 year pleasant Caucasian male with right basal ganglia lacunar infarct in March 2019 secondary to small vessel disease.  Vascular risk factors of hypertension, hyperlipidemia, diabetes and sleep apnea.     PLAN: -Continue {anticoagulants:31417}  and ***  for secondary stroke prevention -F/u with PCP regarding your *** management -continue to monitor BP at home  -Maintain strict control of hypertension with blood pressure goal below 130/90, diabetes with hemoglobin A1c goal below 6.5% and cholesterol with LDL cholesterol (bad cholesterol) goal below 70 mg/dL. I also advised the patient to eat a healthy diet with plenty of whole grains, cereals, fruits and vegetables, exercise regularly and maintain ideal body weight.  Follow up  in *** or call earlier if needed  Continue aspirin 325 mg daily  for secondary stroke prevention He was advised to keep his upcoming appointment at sleep clinic and to be compliant with using his CPAP.    Greater than 50% time during this 45-minute consultation visit was spent on counseling and coordination of care about his lacunar infarct and discussion about prevention and treatment options.    Venancio Poisson, AGNP-BC  [facility] [address] [place], [place] [address]  Phone [phone] Fax [phone] Note: This document was prepared with digital dictation and possible smart phrase technology. Any transcriptional errors that result from this process are unintentional.

## 2018-07-18 ENCOUNTER — Ambulatory Visit: Payer: Medicare Other | Admitting: Adult Health

## 2018-07-20 ENCOUNTER — Other Ambulatory Visit: Payer: Self-pay | Admitting: Family Medicine

## 2018-08-09 DIAGNOSIS — E1140 Type 2 diabetes mellitus with diabetic neuropathy, unspecified: Secondary | ICD-10-CM | POA: Diagnosis not present

## 2018-08-09 DIAGNOSIS — M79671 Pain in right foot: Secondary | ICD-10-CM | POA: Diagnosis not present

## 2018-08-09 DIAGNOSIS — M79672 Pain in left foot: Secondary | ICD-10-CM | POA: Diagnosis not present

## 2018-08-09 DIAGNOSIS — L609 Nail disorder, unspecified: Secondary | ICD-10-CM | POA: Diagnosis not present

## 2018-08-13 ENCOUNTER — Emergency Department (HOSPITAL_COMMUNITY)
Admission: EM | Admit: 2018-08-13 | Discharge: 2018-08-13 | Disposition: A | Discharge status: Home / Self Care | Payer: Medicare Other | Attending: Emergency Medicine | Admitting: Emergency Medicine

## 2018-08-13 ENCOUNTER — Emergency Department (HOSPITAL_COMMUNITY): Payer: Medicare Other

## 2018-08-13 DIAGNOSIS — R339 Retention of urine, unspecified: Secondary | ICD-10-CM

## 2018-08-13 DIAGNOSIS — K5641 Fecal impaction: Secondary | ICD-10-CM | POA: Diagnosis not present

## 2018-08-13 DIAGNOSIS — Z79899 Other long term (current) drug therapy: Secondary | ICD-10-CM | POA: Diagnosis not present

## 2018-08-13 DIAGNOSIS — Z7984 Long term (current) use of oral hypoglycemic drugs: Secondary | ICD-10-CM | POA: Diagnosis not present

## 2018-08-13 DIAGNOSIS — Z8546 Personal history of malignant neoplasm of prostate: Secondary | ICD-10-CM | POA: Insufficient documentation

## 2018-08-13 DIAGNOSIS — E119 Type 2 diabetes mellitus without complications: Secondary | ICD-10-CM | POA: Insufficient documentation

## 2018-08-13 DIAGNOSIS — I252 Old myocardial infarction: Secondary | ICD-10-CM | POA: Diagnosis not present

## 2018-08-13 DIAGNOSIS — Z8673 Personal history of transient ischemic attack (TIA), and cerebral infarction without residual deficits: Secondary | ICD-10-CM | POA: Insufficient documentation

## 2018-08-13 DIAGNOSIS — I10 Essential (primary) hypertension: Secondary | ICD-10-CM | POA: Diagnosis not present

## 2018-08-13 DIAGNOSIS — Z7982 Long term (current) use of aspirin: Secondary | ICD-10-CM | POA: Insufficient documentation

## 2018-08-13 DIAGNOSIS — I2510 Atherosclerotic heart disease of native coronary artery without angina pectoris: Secondary | ICD-10-CM | POA: Insufficient documentation

## 2018-08-13 DIAGNOSIS — K5900 Constipation, unspecified: Secondary | ICD-10-CM | POA: Diagnosis not present

## 2018-08-13 DIAGNOSIS — R1030 Lower abdominal pain, unspecified: Secondary | ICD-10-CM | POA: Diagnosis present

## 2018-08-13 LAB — CBC WITH DIFFERENTIAL/PLATELET
BASOS ABS: 0 10*3/uL (ref 0.0–0.1)
Basophils Relative: 0 %
EOS ABS: 0.2 10*3/uL (ref 0.0–0.7)
Eosinophils Relative: 2 %
HCT: 34.7 % — ABNORMAL LOW (ref 39.0–52.0)
HEMOGLOBIN: 11.9 g/dL — AB (ref 13.0–17.0)
LYMPHS ABS: 0.9 10*3/uL (ref 0.7–4.0)
LYMPHS PCT: 9 %
MCH: 31.6 pg (ref 26.0–34.0)
MCHC: 34.3 g/dL (ref 30.0–36.0)
MCV: 92 fL (ref 78.0–100.0)
Monocytes Absolute: 0.5 10*3/uL (ref 0.1–1.0)
Monocytes Relative: 6 %
NEUTROS PCT: 83 %
Neutro Abs: 8.2 10*3/uL — ABNORMAL HIGH (ref 1.7–7.7)
Platelets: 156 10*3/uL (ref 150–400)
RBC: 3.77 MIL/uL — AB (ref 4.22–5.81)
RDW: 12.1 % (ref 11.5–15.5)
WBC: 9.8 10*3/uL (ref 4.0–10.5)

## 2018-08-13 LAB — URINALYSIS, ROUTINE W REFLEX MICROSCOPIC
BILIRUBIN URINE: NEGATIVE
BILIRUBIN URINE: NEGATIVE
GLUCOSE, UA: 50 mg/dL — AB
Glucose, UA: 50 mg/dL — AB
KETONES UR: NEGATIVE mg/dL
Ketones, ur: NEGATIVE mg/dL
LEUKOCYTES UA: NEGATIVE
Leukocytes, UA: NEGATIVE
NITRITE: NEGATIVE
Nitrite: NEGATIVE
PROTEIN: NEGATIVE mg/dL
PROTEIN: NEGATIVE mg/dL
SPECIFIC GRAVITY, URINE: 1.013 (ref 1.005–1.030)
Specific Gravity, Urine: 1.01 (ref 1.005–1.030)
pH: 5 (ref 5.0–8.0)
pH: 6 (ref 5.0–8.0)

## 2018-08-13 LAB — BASIC METABOLIC PANEL
ANION GAP: 8 (ref 5–15)
BUN: 37 mg/dL — AB (ref 8–23)
CHLORIDE: 103 mmol/L (ref 98–111)
CO2: 30 mmol/L (ref 22–32)
Calcium: 9.5 mg/dL (ref 8.9–10.3)
Creatinine, Ser: 1.5 mg/dL — ABNORMAL HIGH (ref 0.61–1.24)
GFR calc non Af Amer: 41 mL/min — ABNORMAL LOW (ref >60)
GFR, EST AFRICAN AMERICAN: 47 mL/min — AB (ref >60)
Glucose, Bld: 174 mg/dL — ABNORMAL HIGH (ref 70–99)
POTASSIUM: 3.6 mmol/L (ref 3.5–5.1)
Sodium: 141 mmol/L (ref 135–145)

## 2018-08-13 LAB — CBG MONITORING, ED: GLUCOSE-CAPILLARY: 162 mg/dL — AB (ref 70–99)

## 2018-08-13 NOTE — Discharge Instructions (Addendum)
Begin to take over the counter stool softener (ie: miralax, colace), as directed on packaging, for the next few weeks.  Continue to take your usual prescriptions as previously directed. Keep the foley catheter in place until you are seen in follow up by your Urologist. Your "kidney function tests" (BUN/Cr) were mildly elevated today, and will need to be rechecked this week. Call your regular medical doctor and your Urologist tomorrow to schedule a follow up appointment within the next 2 to 3 days.  Return to the Emergency Department immediately if worsening.

## 2018-08-13 NOTE — ED Triage Notes (Signed)
Pt to ED for abdominal pain. Pt reports constipation, LBM 2 days ago. Pt denies N/V.

## 2018-08-13 NOTE — ED Notes (Signed)
Took pt to bathroom. Pt stated he had large bm and feels much better.

## 2018-08-13 NOTE — ED Provider Notes (Signed)
[facility] EMERGENCY DEPARTMENT Provider Note   CSN: [id number] Arrival date & time: 08/13/18  1012     History   Chief Complaint Chief Complaint  Patient presents with  . Abdominal Pain    HPI Luke Solis is a 84 y.o. male.   Abdominal Pain      Pt was seen at 1150.  Per pt and his family, c/o gradual onset and persistence of constant suprapubic discomfort since overnight last night. Pt states he has not had a BM x2 days and "feels constipated." States he tried to pass stool last night and now feels "pressure" in his rectal area "like I can't get the stool out." Pt also states he has been experiencing urinary retention "during the daytime and then I go a lot at night" for the past several days. Denies CP/SOB, no back/flank pain, no rectal pain, no N/V/D, no dysuria/hematuria, no testicular pain/swelling, no black or blood in stools, no fevers, no rash, no injury.   Past Medical History:  Diagnosis Date  . Coronary atherosclerosis of native coronary artery    BMS proximal and mid RCA as well as BMS circumflex - 2002 (had residual 70% distal LAD), LVEF 60%  . Diabetes mellitus without complication ([place])   . Essential hypertension, benign   . Hyperlipidemia   . Neuropathy   . NSTEMI (non-ST elevated myocardial infarction) ([place])    2002  . Prostate cancer ([place])   . Sleep apnea    On CPAP  . Stroke [facility])     Patient Active Problem List   Diagnosis Date Noted  . Altered mental status 02/28/2018  . Anemia 02/28/2018  . Fatigue 08/03/2017  . Prostate cancer ([place]) 10/22/2015  . Forgetfulness 04/29/2015  . Type 2 diabetes mellitus with diabetic neuropathy ([place]) 07/17/2013  . Hyperlipidemia 01/19/2010  . OSA (obstructive sleep apnea) 01/19/2010  . Essential hypertension, benign 01/19/2010  . Coronary atherosclerosis of native coronary artery 01/19/2010    Past Surgical History:  Procedure Laterality Date  . COLONOSCOPY    . INGUINAL HERNIA REPAIR    . KNEE SURGERY     . NOSE SURGERY    . PROSTATE SURGERY    . TONSILLECTOMY          Home Medications    Prior to Admission medications   Medication Sig Start Date End Date Taking? Authorizing Provider  amLODipine (NORVASC) 5 MG tablet Take 1 tablet (5 mg total) by mouth daily. 03/23/18 08/13/18 Yes Kathyrn Drown, MD  aspirin EC 325 MG tablet Take 1 tablet (325 mg total) by mouth daily. 03/02/18 03/02/19 Yes Johnson, Clanford L, MD  Calcium Carbonate-Vitamin D 600-400 MG-UNIT per tablet Take 1 tablet by mouth daily.   Yes [provider]  hydrochlorothiazide (HYDRODIURIL) 25 MG tablet Take 12.5 tablets by mouth daily. 07/19/18  Yes [provider]  metFORMIN (GLUCOPHAGE-XR) 500 MG 24 hr tablet TAKE 2 TABLETS TWICE DAILY WITH MEALS. 03/30/18  Yes [name], [name], MD  Multiple Vitamin (MULTIVITAMIN) tablet Take 1 tablet by mouth daily.   Yes [provider]  nortriptyline (PAMELOR) 10 MG capsule 2 nightly, may have 1 additional as needed 03/24/18  Yes [name], Scott A, MD  pregabalin (LYRICA) 100 MG capsule TAKE 1 CAPSULE IN THE MORNING AND 2 CAPSULES AT BEDTIME. Patient taking differently: Take 100 mg by mouth 2 (two) times daily. TAKE 1 CAPSULE IN THE MORNING AND 2 CAPSULES AT BEDTIME. 03/23/18  Yes [name], [name], MD  rosuvastatin (CRESTOR) 40  MG tablet Take 1 tablet (40 mg total) by mouth daily. 04/11/18  Yes Kathyrn Drown, MD  ALLERGY RELIEF 180 MG tablet TAKE 1 TABLET ONCE DAILY AS NEEDED FOR ALLERGIES. 03/30/18   Kathyrn Drown, MD  [facility] PREMIUM V10 TEST test strip USE AS DIRECTED. 07/20/17   Mikey Kirschner, MD  nitroGLYCERIN (NITROSTAT) 0.4 MG SL tablet PLACE 1 TABLET UNDER TONGUE EVERY 5 MINTUES UPTO 3 TIMES AS NEEDED FOR CHEST PAIN. 07/20/18   Kathyrn Drown, MD    Family History Family History  Problem Relation Age of Onset  . Heart attack Mother   . Diabetes Mother   . Coronary artery disease Father   . Hypertension Father   . Heart attack Father   . Stroke  Maternal Grandfather   . Lung disease Neg Hx   . Cancer Neg Hx     Social History Social History   Tobacco Use  . Smoking status: Never Smoker  . Smokeless tobacco: Never Used  Substance Use Topics  . Alcohol use: No    Alcohol/week: 0.0 standard drinks  . Drug use: No     Allergies   Broccoli [brassica oleracea italica] and Celery oil   Review of Systems Review of Systems  Gastrointestinal: Positive for abdominal pain.  ROS: Statement: All systems negative except as marked or noted in the HPI; Constitutional: Negative for fever and chills. ; ; Eyes: Negative for eye pain, redness and discharge. ; ; ENMT: Negative for ear pain, hoarseness, nasal congestion, sinus pressure and sore throat. ; ; Cardiovascular: Negative for chest pain, palpitations, diaphoresis, dyspnea and peripheral edema. ; ; Respiratory: Negative for cough, wheezing and stridor. ; ; Gastrointestinal: +constipation, rectal pressure. Negative for nausea, vomiting, diarrhea, blood in stool, hematemesis, jaundice and rectal bleeding. . ; ; Genitourinary: +urinary retention, suprapubic abd pain. Negative for dysuria, flank pain and hematuria. ; ; Genital:  No penile drainage or rash, no testicular pain or swelling, no scrotal rash or swelling. ;; Musculoskeletal: Negative for back pain and neck pain. Negative for swelling and trauma.; ; Skin: Negative for pruritus, rash, abrasions, blisters, bruising and skin lesion.; ; Neuro: Negative for headache, lightheadedness and neck stiffness. Negative for weakness, altered level of consciousness, altered mental status, extremity weakness, paresthesias, involuntary movement, seizure and syncope.        Physical Exam Updated Vital Signs BP (!) 166/78   Pulse 72   Temp (!) 97.5 F (36.4 C) (Oral)   Resp 18   Ht 6\' 2"  (1.88 m)   Wt 74.8 kg   SpO2 97%   BMI 21.18 kg/m   Physical Exam 1155: Physical examination:  Nursing notes reviewed; Vital signs and O2 SAT reviewed;   Constitutional: Well developed, Well nourished, Well hydrated, In no acute distress; Head:  Normocephalic, atraumatic; Eyes: EOMI, PERRL, No scleral icterus; ENMT: Mouth and pharynx normal, Mucous membranes moist; Neck: Supple, Full range of motion, No lymphadenopathy; Cardiovascular: Regular rate and rhythm, No gallop; Respiratory: Breath sounds clear & equal bilaterally, No wheezes.  Speaking full sentences with ease, Normal respiratory effort/excursion; Chest: Nontender, Movement normal; Abdomen: Soft, +suprapubic area softly distended and mildly TTP. Otherwise abd is nontender on exam. Normal bowel sounds. Rectal exam performed w/permission of pt and ED RN chaperone present.  Anal tone normal.  Non-tender, soft brown stool in rectal vault, heme neg.  +fecal impaction. No fissures, no external hemorrhoids.; Genitourinary: No CVA tenderness; Extremities: Peripheral pulses normal, No tenderness, No edema, No calf edema or  asymmetry.; Neuro: AA&Ox3, Major CN grossly intact.  Speech clear. No gross focal motor or sensory deficits in extremities.; Skin: Color normal, Warm, Dry.     ED Treatments / Results  Labs (all labs ordered are listed, but only abnormal results are displayed)   EKG None  Radiology   Procedures Procedures (including critical care time)  Medications Ordered in ED Medications - No data to display   Initial Impression / Assessment and Plan / ED Course  I have reviewed the triage vital signs and the nursing notes.  Pertinent labs & imaging results that were available during my care of the patient were reviewed by me and considered in my medical decision making (see chart for details).  MDM Reviewed: previous chart, nursing note and vitals Reviewed previous: labs Interpretation: labs and x-ray    Results for orders placed or performed during the hospital encounter of 08/13/18  Urinalysis, Routine w reflex microscopic  Result Value Ref Range   Color, Urine YELLOW  YELLOW   APPearance HAZY (A) CLEAR   Specific Gravity, Urine 1.013 1.005 - 1.030   pH 5.0 5.0 - 8.0   Glucose, UA 50 (A) NEGATIVE mg/dL   Hgb urine dipstick SMALL (A) NEGATIVE   Bilirubin Urine NEGATIVE NEGATIVE   Ketones, ur NEGATIVE NEGATIVE mg/dL   Protein, ur NEGATIVE NEGATIVE mg/dL   Nitrite NEGATIVE NEGATIVE   Leukocytes, UA NEGATIVE NEGATIVE   RBC / HPF 0-5 0 - 5 RBC/hpf   WBC, UA 0-5 0 - 5 WBC/hpf   Bacteria, UA RARE (A) NONE SEEN   Squamous Epithelial / LPF 0-5 0 - 5   Mucus PRESENT    Granular Casts, UA PRESENT   CBC with Differential  Result Value Ref Range   WBC 9.8 4.0 - 10.5 K/uL   RBC 3.77 (L) 4.22 - 5.81 MIL/uL   Hemoglobin 11.9 (L) 13.0 - 17.0 g/dL   HCT 34.7 (L) 39.0 - 52.0 %   MCV 92.0 78.0 - 100.0 fL   MCH 31.6 26.0 - 34.0 pg   MCHC 34.3 30.0 - 36.0 g/dL   RDW 12.1 11.5 - 15.5 %   Platelets 156 150 - 400 K/uL   Neutrophils Relative % 83 %   Neutro Abs 8.2 (H) 1.7 - 7.7 K/uL   Lymphocytes Relative 9 %   Lymphs Abs 0.9 0.7 - 4.0 K/uL   Monocytes Relative 6 %   Monocytes Absolute 0.5 0.1 - 1.0 K/uL   Eosinophils Relative 2 %   Eosinophils Absolute 0.2 0.0 - 0.7 K/uL   Basophils Relative 0 %   Basophils Absolute 0.0 0.0 - 0.1 K/uL  Basic metabolic panel  Result Value Ref Range   Sodium 141 135 - 145 mmol/L   Potassium 3.6 3.5 - 5.1 mmol/L   Chloride 103 98 - 111 mmol/L   CO2 30 22 - 32 mmol/L   Glucose, Bld 174 (H) 70 - 99 mg/dL   BUN 37 (H) 8 - 23 mg/dL   Creatinine, Ser 1.50 (H) 0.61 - 1.24 mg/dL   Calcium 9.5 8.9 - 10.3 mg/dL   GFR calc non Af Amer 41 (L) >60 mL/min   GFR calc Af Amer 47 (L) >60 mL/min   Anion gap 8 5 - 15  Urinalysis, Routine w reflex microscopic  Result Value Ref Range   Color, Urine STRAW (A) YELLOW   APPearance CLEAR CLEAR   Specific Gravity, Urine 1.010 1.005 - 1.030   pH 6.0 5.0 - 8.0   Glucose,  UA 50 (A) NEGATIVE mg/dL   Hgb urine dipstick SMALL (A) NEGATIVE   Bilirubin Urine NEGATIVE NEGATIVE   Ketones, ur  NEGATIVE NEGATIVE mg/dL   Protein, ur NEGATIVE NEGATIVE mg/dL   Nitrite NEGATIVE NEGATIVE   Leukocytes, UA NEGATIVE NEGATIVE   WBC, UA 0-5 0 - 5 WBC/hpf   Bacteria, UA RARE (A) NONE SEEN   Mucus PRESENT   CBG monitoring, ED  Result Value Ref Range   Glucose-Capillary 162 (H) 70 - 99 mg/dL   Dg Abd Acute W/chest Result Date: 08/13/2018 CLINICAL DATA:  Constipation. EXAM: DG ABDOMEN ACUTE W/ 1V CHEST COMPARISON:  Two-view chest x-ray 02/28/18. FINDINGS: The heart size normal. Lungs are clear. Atherosclerotic calcifications are again noted at the aortic arch. There is no edema or effusion. Nipple shadows are noted bilaterally. Moderate stool is present in the ascending colon. There is gas throughout the descending and sigmoid colon. The moderate amount of stool is present at the rectum. Radiopaque seeds are present prostate. Degenerative changes are present in the lower lumbar spine. IMPRESSION: 1. Moderate stool at the rectum. This may represent partial impaction. There is no obstruction. 2. Bowel gas pattern is otherwise within normal limits. 3. No acute cardiopulmonary disease. 4. Aortic atherosclerosis. 5. Degenerative changes in the lower lumbar spine. Electronically Signed   By: San Morelle M.D.   On: 08/13/2018 13:14    1445:  Pt disimpacted of stool and then passed a "large BM;" states he "feels better now." Pt urinated, then bladder scanned: 464ml urine left in bladder. Foley placed and draining well. H/H near baseline. BUN/Cr mildly elevated from baseline; likely post-obstructive given post-void residual today. Will have pt maintain foley and f/u outpt (Uro vs PMD) for labs recheck. Workup otherwise reassuring. Pt has tol PO well while in the ED without N/V. Pt and family would like to go home now. Dx and testing d/w pt and family.  Questions answered.  Verb understanding, agreeable to d/c home with outpt f/u.      Final Clinical Impressions(s) / ED Diagnoses   Final diagnoses:    Fecal impaction in rectum [facility])  Urinary retention    ED Discharge Orders    None       Francine Graven, DO 08/15/18 1016

## 2018-08-13 NOTE — ED Notes (Signed)
Bladder Scanner reads 48mL

## 2018-08-13 NOTE — ED Notes (Signed)
Pt taken to xray 

## 2018-08-13 NOTE — ED Notes (Signed)
Instructions given to wife and daughter about foley cath. Wife returned demonstraton. Advised to keep bag below bladder and not on floor.

## 2018-08-15 LAB — URINE CULTURE: CULTURE: NO GROWTH

## 2018-08-16 ENCOUNTER — Ambulatory Visit: Payer: Self-pay | Admitting: Neurology

## 2018-08-17 ENCOUNTER — Ambulatory Visit: Payer: Medicare Other | Admitting: Family Medicine

## 2018-08-22 ENCOUNTER — Ambulatory Visit (INDEPENDENT_AMBULATORY_CARE_PROVIDER_SITE_OTHER): Payer: Medicare Other | Admitting: Family Medicine

## 2018-08-22 ENCOUNTER — Encounter: Payer: Self-pay | Admitting: Family Medicine

## 2018-08-22 ENCOUNTER — Other Ambulatory Visit: Payer: Self-pay | Admitting: Family Medicine

## 2018-08-22 VITALS — BP 158/72 | Temp 98.1°F | Ht 74.0 in | Wt 166.1 lb

## 2018-08-22 DIAGNOSIS — R300 Dysuria: Secondary | ICD-10-CM

## 2018-08-22 DIAGNOSIS — E1140 Type 2 diabetes mellitus with diabetic neuropathy, unspecified: Secondary | ICD-10-CM | POA: Diagnosis not present

## 2018-08-22 DIAGNOSIS — I6381 Other cerebral infarction due to occlusion or stenosis of small artery: Secondary | ICD-10-CM | POA: Diagnosis not present

## 2018-08-22 DIAGNOSIS — E785 Hyperlipidemia, unspecified: Secondary | ICD-10-CM

## 2018-08-22 LAB — POCT URINALYSIS DIPSTICK
Glucose, UA: POSITIVE — AB
LEUKOCYTES UA: NEGATIVE
PH UA: 5 (ref 5.0–8.0)
Protein, UA: POSITIVE — AB
RBC UA: NEGATIVE
Spec Grav, UA: 1.02 (ref 1.010–1.025)

## 2018-08-22 MED ORDER — TAMSULOSIN HCL 0.4 MG PO CAPS: 0.4000 mg | ORAL_CAPSULE | Freq: Every day | ORAL | 1 refills | Status: DC

## 2018-08-22 NOTE — Progress Notes (Signed)
   Subjective:    Patient ID: Luke Solis, male    DOB: 1931/10/25, 83 y.o.   MRN: [id number]  HPI Patient is here today with complaint of a uti. Sunday a week ago he woke with pain and went to the ed. They told him he was impacted, and urine was backing up as well. Inserted cath was left in for a few days then saw Dr.Davis nurse and she took out the cath and did a u/s and states he did not have any urine in his bladder. Per wife he is not going to urinate very often. ER note was reviewed Recent labs reviewed Patient states bowel movements sometimes somewhat difficult to have but are soft Denies rectal bleeding States urinary flow is slow Some days he pees better other days he does not Review of Systems  Constitutional: Negative for activity change, fatigue and fever.  HENT: Negative for congestion and rhinorrhea.   Respiratory: Negative for cough and shortness of breath.   Cardiovascular: Negative for chest pain and leg swelling.  Gastrointestinal: Negative for abdominal pain, diarrhea and nausea.  Genitourinary: Negative for dysuria and hematuria.  Neurological: Negative for weakness and headaches.  Psychiatric/Behavioral: Negative for agitation and behavioral problems.       Results for orders placed or performed in visit on 08/22/18  POCT urinalysis dipstick  Result Value Ref Range   Color, UA     Clarity, UA     Glucose, UA Positive (A) Negative   Bilirubin, UA     Ketones, UA     Spec Grav, UA 1.020 1.010 - 1.025   Blood, UA Negative    pH, UA 5.0 5.0 - 8.0   Protein, UA Positive (A) Negative   Urobilinogen, UA     Nitrite, UA     Leukocytes, UA Negative Negative   Appearance     Odor      Objective:   Physical Exam  Constitutional: He appears well-nourished. No distress.  HENT:  Head: Normocephalic and atraumatic.  Eyes: Right eye exhibits no discharge. Left eye exhibits no discharge.  Neck: No tracheal deviation present.  Cardiovascular: Normal rate, regular  rhythm and normal heart sounds.  No murmur heard. Pulmonary/Chest: Effort normal and breath sounds normal. No respiratory distress.  Musculoskeletal: He exhibits no edema.  Lymphadenopathy:    He has no cervical adenopathy.  Neurological: He is alert. Coordination normal.  Skin: Skin is warm and dry.  Psychiatric: He has a normal mood and affect. His behavior is normal.  Vitals reviewed.  Rectal exam normal no impaction      15  minutes was spent with patient today discussing healthcare issues which they came.  More than 50% of this visit-total duration of visit-was spent in counseling and coordination of care.  Please see diagnosis regarding the focus of this coordination and care  Assessment & Plan:  Urinary retention We will go ahead and send urine culture Try Flomax to see if this will help if it causes side effects or not helping over the next 2 weeks. Will send update to Dr. Rosana Hoes regarding this patient  Lab work ordered for his medical follow-up in October  Intermittent bowel movements we did discuss how to best promote regular bowel movements daily sit times no need for stool softeners no sign of impaction on today's

## 2018-08-24 LAB — URINE CULTURE: ORGANISM ID, BACTERIA: NO GROWTH

## 2018-08-24 LAB — SPECIMEN STATUS REPORT

## 2018-08-25 ENCOUNTER — Ambulatory Visit (INDEPENDENT_AMBULATORY_CARE_PROVIDER_SITE_OTHER): Payer: Medicare Other | Admitting: Family Medicine

## 2018-08-25 ENCOUNTER — Encounter: Payer: Self-pay | Admitting: Family Medicine

## 2018-08-25 VITALS — BP 112/78 | Temp 97.6°F | Ht 74.0 in | Wt 166.2 lb

## 2018-08-25 DIAGNOSIS — N3001 Acute cystitis with hematuria: Secondary | ICD-10-CM | POA: Diagnosis not present

## 2018-08-25 DIAGNOSIS — I6381 Other cerebral infarction due to occlusion or stenosis of small artery: Secondary | ICD-10-CM | POA: Diagnosis not present

## 2018-08-25 DIAGNOSIS — R319 Hematuria, unspecified: Secondary | ICD-10-CM

## 2018-08-25 LAB — POCT URINALYSIS DIPSTICK
Spec Grav, UA: 1.02 (ref 1.010–1.025)
pH, UA: 5 (ref 5.0–8.0)

## 2018-08-25 MED ORDER — SULFAMETHOXAZOLE-TRIMETHOPRIM 800-160 MG PO TABS: 1.0000 | ORAL_TABLET | Freq: Two times a day (BID) | ORAL | 0 refills | Status: DC

## 2018-08-25 NOTE — Progress Notes (Signed)
   Subjective:    Patient ID: Luke Solis, male    DOB: December 26, 1930, 84 y.o.   MRN: [id number]  Hematuria  This is a new problem. The current episode started yesterday.  pt wife states that she seen a lot of blood in depends this morning. When she went to the bathroom she noticed a few drops on the bath mat. Seen Dr.Scott earlier this week for dysuria and was put on Tamsulosin.  Results for orders placed or performed in visit on 08/25/18  POCT Urinalysis Dipstick  Result Value Ref Range   Color, UA     Clarity, UA     Glucose, UA     Bilirubin, UA +    Ketones, UA     Spec Grav, UA 1.020 1.010 - 1.025   Blood, UA +    pH, UA 5.0 5.0 - 8.0   Protein, UA     Urobilinogen, UA     Nitrite, UA     Leukocytes, UA Small (1+) (A) Negative   Appearance     Odor     Drops of blood this morn  Pos   Hematuria , gross, oafter cath inserted   Review of Systems  Genitourinary: Positive for hematuria.       Objective:   Physical Exam  Alert active good hydration no acute distress.  Lungs clear.  Heart regular rate and rhythm.  No CVA tenderness.  Urinalysis numerous white blood cells numerous red blood cells      Assessment & Plan:  Impression probable urinary tract infection.  Complicated history.  Patient and spouse went into great detail regarding all.  History of prostate cancer.  History of TURP.  History of recent catheterization with outlet obstruction and complicated by constipationnow resolved.  Catheter now out.  Urine culture earlier in the week negative.  Now with dysuria increased urinary frequency.  And post hematuria.  Wonders if this is caused by Flomax.  Advised Flomax is not causing it.  Will add antibiotics.  Culture this urine maintain Flomax, maintain close follow-up with urologist  Greater than 50% of this 25 minute face to face visit was spent in counseling and discussion and coordination of care regarding the above diagnosis/diagnosies

## 2018-08-27 LAB — URINE CULTURE

## 2018-08-27 LAB — SPECIMEN STATUS REPORT

## 2018-08-29 ENCOUNTER — Telehealth: Payer: Self-pay | Admitting: Family Medicine

## 2018-08-29 ENCOUNTER — Ambulatory Visit (INDEPENDENT_AMBULATORY_CARE_PROVIDER_SITE_OTHER): Payer: Medicare Other | Admitting: Family Medicine

## 2018-08-29 VITALS — BP 108/74 | Ht 74.0 in | Wt 166.0 lb

## 2018-08-29 DIAGNOSIS — I6381 Other cerebral infarction due to occlusion or stenosis of small artery: Secondary | ICD-10-CM | POA: Diagnosis not present

## 2018-08-29 DIAGNOSIS — R296 Repeated falls: Secondary | ICD-10-CM

## 2018-08-29 DIAGNOSIS — R39198 Other difficulties with micturition: Secondary | ICD-10-CM | POA: Diagnosis not present

## 2018-08-29 DIAGNOSIS — I951 Orthostatic hypotension: Secondary | ICD-10-CM

## 2018-08-29 MED ORDER — AMLODIPINE BESYLATE 2.5 MG PO TABS: 2.5000 mg | ORAL_TABLET | Freq: Every day | ORAL | 5 refills | Status: DC

## 2018-08-29 NOTE — Progress Notes (Signed)
   Subjective:    Patient ID: Luke Solis, male    DOB: 1931-04-08, 84 y.o.   MRN: [id number]  HPI Patient arrives to discuss frequent falls. Patient having problems wit balance and weakness The patient is having some serious issues.  He is having some balance issues he is also having dizziness when he stands up needs also having some falls He is trying to eat relatively healthy he is taking his medications he states he is having difficulty with urination Patient was in the ER because of urinary retention had to have a catheter no longer has a catheter we were trying Flomax but now he is having some problems in addition to this also he is on multiple medications which can also cause side effects he brings in all of his medicine for review Review of Systems  Constitutional: Negative for activity change, appetite change and fatigue.  HENT: Negative for congestion and rhinorrhea.   Respiratory: Negative for cough and shortness of breath.   Cardiovascular: Negative for chest pain and leg swelling.  Gastrointestinal: Negative for abdominal pain, constipation, nausea and vomiting.  Genitourinary: Positive for difficulty urinating and frequency.  Neurological: Positive for dizziness, weakness and light-headedness. Negative for headaches.  Psychiatric/Behavioral: Negative for agitation and behavioral problems.       Objective:   Physical Exam  Constitutional: He appears well-nourished. No distress.  HENT:  Head: Normocephalic and atraumatic.  Eyes: Right eye exhibits no discharge. Left eye exhibits no discharge.  Neck: No tracheal deviation present.  Cardiovascular: Normal rate, regular rhythm and normal heart sounds.  No murmur heard. Pulmonary/Chest: Effort normal and breath sounds normal. No respiratory distress.  Musculoskeletal: He exhibits no edema.  Lymphadenopathy:    He has no cervical adenopathy.  Neurological: He is alert. Coordination normal.  Skin: Skin is warm and dry.    Psychiatric: He has a normal mood and affect. His behavior is normal.  Vitals reviewed.    Blood pressure laying sitting standing show a significant drop with the blood pressure orthostasis     Assessment & Plan:  I believe his frequent falls are related to his medication Stop Flomax Reduce amlodipine 2.5 mg daily Continue HCTZ 12.5 mg Stop nortriptyline Patient needs follow-up in 3 weeks Wife is to give [place] update at the end of the week how he is doing  We will also connect with Dr. Rosana Hoes urology this patient would benefit from being seen sooner regarding his difficulty urinating medication is not advisable for this patient who may need surgical issue/procedure Trying to prevent another catheterization for this patient

## 2018-08-29 NOTE — Telephone Encounter (Signed)
Contacted patient wife and informed them to be here at 3 pm, stop flomax and bring all meds. Pt wife verbalized understanding.

## 2018-08-29 NOTE — Telephone Encounter (Signed)
Stop Flomax Bring all medicines with him I recommend office visit today at 3 PM

## 2018-08-29 NOTE — Telephone Encounter (Signed)
Patient has been falling a lot and fell today but not injured. He doesn't know if it is the medications he's on or not.

## 2018-08-29 NOTE — Telephone Encounter (Signed)
Fell this morning; no injury. Started this week and fell yesterday in kitchen against fridge. This morning fell over in closet. Has been unsteady on feet all week. Pt was put on two different medications. Pt was seen on 9/17 and 9/20. Please advise. Thank you

## 2018-08-30 NOTE — Progress Notes (Signed)
[facility] to see if they could see patient sooner. Pt had appt for Oct 21; pt has been moved to Sept 27 at 11:45 am arrive 15 minutes early. Contacted patients wife and informed her of new appt. Pt verbalized understanding.

## 2018-09-01 DIAGNOSIS — N35014 Post-traumatic urethral stricture, male, unspecified: Secondary | ICD-10-CM | POA: Diagnosis not present

## 2018-09-01 DIAGNOSIS — N3040 Irradiation cystitis without hematuria: Secondary | ICD-10-CM | POA: Diagnosis not present

## 2018-09-01 DIAGNOSIS — R9721 Rising PSA following treatment for malignant neoplasm of prostate: Secondary | ICD-10-CM | POA: Diagnosis not present

## 2018-09-01 DIAGNOSIS — N3946 Mixed incontinence: Secondary | ICD-10-CM | POA: Diagnosis not present

## 2018-09-03 ENCOUNTER — Encounter: Payer: Self-pay | Admitting: Nurse Practitioner

## 2018-09-04 NOTE — Progress Notes (Addendum)
[facility]  PATIENT: Luke Solis DOB: 1931/05/12   REASON FOR VISIT: Follow-up for  obstructive sleep apnea with CPAP compliance HISTORY FROM: Patient   HISTORY OF PRESENT ILLNESS:UPDATE 10/1/2019CM Luke Solis, 84 year old male returns for follow-up with history of obstructive sleep apnea here for CPAP compliance he has just obtained a new machine.  Wife reports he has a significant leak blows on her at night.  Compliance data dated 08/05/2018-09/03/2018 shows compliance greater than 4 hours at 77%.  Average usage 7 hours 11 minutes.  Set pressure 11 cm.  EPR level 3.  Leak 95th percentile at 109.7.  AHI 17.1.  ESS score 18.  He returns for reevaluation 05/03/18 Luke Solis is an 84 year old right-handed gentleman with an underlying medical history of stroke (followed by Dr. Leonie Solis), prostate cancer, diabetes, neuropathy, hyperlipidemia, non-STEMI, and hypertension, who was previously diagnosed with obstructive sleep apnea previously followed with Luke Solis in pulmonology for this. His last office visit for sleep apnea management was in October 2018 and I reviewed that note. He was recently restarted on CPAP therapy. Sleep study testing was in or around 2006. He was having trouble tolerating the mask and pressure. His residual AHI was also elevated at 18.8, he was on AutoPap. Prior sleep study results are not available for my review today. He has an older CPAP machine. I reviewed his most recent compliance data from the past 90 days. Of note, he has not been using his CPAP since approximately 02/26/2018. He reports that he has had to travel back and forth, his son recently had a heart attack. He has an AutoPap machine with a pressure range of 8 cm to 15 cm without EPR. 95th percentile pressure is 14.2, leak is extremely high with the 95th percentile at 102 L/m. Residual AHI is also increased at 13.6 per hour. He has used his AutoPap in the past 90 days for 34 days. Average usage for  days on treatment is 4 hours and 53 minutes. He has daytime somnolence. He recently received parts for this machine. He is married and lives with his wife, he is retired. They have 4 children. He is a nonsmoker and does not utilize alcohol and does not utilize caffeine on a regular basis. His Epworth sleepiness score is 24, fatigue score is 48 out of 63. He has some residual slowness in thinking since the stroke, not so much L sided weakness, also complains of overall fatigue. Blood sugars have been up lately, but A1c has been improving in the last 9 months. He is not sure that he wants to have another sleep study, he is not sure that he will be able to use CPAP. Nevertheless, he is willing to think about it. His wife reports that he has been sleepy for quite some time, unfortunately, sleep is also quite interrupted because of his significant nocturia. He reports that since his prostate cancer treatment he has to get up and use the bathroom at night several times, on average 5-6 times. Bedtime is around 11 and rise time around 8. He has been using a large F 20 full face mask. He has had daytime somnolence for years. This is why he had his first sleep study several years ago and when he was able to use CPAP he improved in terms of daytime somnolence. He has no severe sleepiness dating back to teenage years or college age. He has no family history of narcolepsy.  REVIEW OF SYSTEMS: Full 14 system review of  systems performed and notable only for those listed, all others are neg:  Constitutional: neg  Cardiovascular: neg Ear/Nose/Throat: neg  Skin: neg Eyes: neg Respiratory: neg Gastroitestinal: neg  Hematology/Lymphatic: neg  Endocrine: neg Musculoskeletal:neg Allergy/Immunology: neg Neurological: neg Psychiatric: neg Sleep : Obstructive sleep apnea with CPAP   ALLERGIES: Allergies  Allergen Reactions  . Broccoli [Brassica Oleracea Italica] Nausea And Vomiting  . Celery Oil Nausea And Vomiting      HOME MEDICATIONS: Outpatient Medications Prior to Visit  Medication Sig Dispense Refill  . ALLERGY RELIEF 180 MG tablet TAKE 1 TABLET ONCE DAILY AS NEEDED FOR ALLERGIES. 30 tablet 5  . amLODipine (NORVASC) 2.5 MG tablet Take 1 tablet (2.5 mg total) by mouth daily. 30 tablet 5  . aspirin EC 325 MG tablet Take 1 tablet (325 mg total) by mouth daily. 100 tablet 3  . Calcium Carbonate-Vitamin D 600-400 MG-UNIT per tablet Take 1 tablet by mouth daily.    Marland Kitchen FORACARE PREMIUM V10 TEST test strip USE AS DIRECTED. 50 each 0  . hydrochlorothiazide (HYDRODIURIL) 25 MG tablet Take 12.5 tablets by mouth daily.    . metFORMIN (GLUCOPHAGE-XR) 500 MG 24 hr tablet TAKE 2 TABLETS TWICE DAILY WITH MEALS. 120 tablet 5  . Multiple Vitamin (MULTIVITAMIN) tablet Take 1 tablet by mouth daily.    . nitroGLYCERIN (NITROSTAT) 0.4 MG SL tablet PLACE 1 TABLET UNDER TONGUE EVERY 5 MINTUES UPTO 3 TIMES AS NEEDED FOR CHEST PAIN. 28 tablet 4  . pregabalin (LYRICA) 100 MG capsule TAKE 1 CAPSULE IN THE MORNING AND 2 CAPSULES AT BEDTIME. (Patient taking differently: Take 100 mg by mouth 2 (two) times daily. TAKE 1 CAPSULE IN THE MORNING AND 2 CAPSULES AT BEDTIME.) 90 capsule 5  . rosuvastatin (CRESTOR) 40 MG tablet TAKE (1) TABLET BY MOUTH ONCE DAILY. 30 tablet 11  . sulfamethoxazole-trimethoprim (BACTRIM DS,SEPTRA DS) 800-160 MG tablet Take 1 tablet by mouth 2 (two) times daily. (Patient not taking: Reported on 09/05/2018) 20 tablet 0   No facility-administered medications prior to visit.     PAST MEDICAL HISTORY: Past Medical History:  Diagnosis Date  . Coronary atherosclerosis of native coronary artery    BMS proximal and mid RCA as well as BMS circumflex - 2002 (had residual 70% distal LAD), LVEF 60%  . Diabetes mellitus without complication ([place])   . Essential hypertension, benign   . Hyperlipidemia   . Neuropathy   . NSTEMI (non-ST elevated myocardial infarction) ([place])    2002  . Prostate cancer ([place])   . Sleep  apnea    On CPAP  . Stroke [facility])     PAST SURGICAL HISTORY: Past Surgical History:  Procedure Laterality Date  . COLONOSCOPY    . INGUINAL HERNIA REPAIR    . KNEE SURGERY    . NOSE SURGERY    . PROSTATE SURGERY    . TONSILLECTOMY      FAMILY HISTORY: Family History  Problem Relation Age of Onset  . Heart attack Mother   . Diabetes Mother   . Coronary artery disease Father   . Hypertension Father   . Heart attack Father   . Stroke Maternal Grandfather   . Lung disease Neg Hx   . Cancer Neg Hx     SOCIAL HISTORY: Social History   Socioeconomic History  . Marital status: Married    Spouse name: Not on file  . Number of children: Not on file  . Years of education: Not on file  . Highest education level:  Not on file  Occupational History  . Not on file  Social Needs  . Financial resource strain: Not on file  . Food insecurity:    Worry: Not on file    Inability: Not on file  . Transportation needs:    Medical: Not on file    Non-medical: Not on file  Tobacco Use  . Smoking status: Never Smoker  . Smokeless tobacco: Never Used  Substance and Sexual Activity  . Alcohol use: No    Alcohol/week: 0.0 standard drinks  . Drug use: No  . Sexual activity: Not on file  Lifestyle  . Physical activity:    Days per week: Not on file    Minutes per session: Not on file  . Stress: Not on file  Relationships  . Social connections:    Talks on phone: Not on file    Gets together: Not on file    Attends religious service: Not on file    Active member of club or organization: Not on file    Attends meetings of clubs or organizations: Not on file    Relationship status: Not on file  . Intimate partner violence:    Fear of current or ex partner: Not on file    Emotionally abused: Not on file    Physically abused: Not on file    Forced sexual activity: Not on file  Other Topics Concern  . Not on file  Social History Narrative   [facility] (08/03/17):    Originally from [facility]. Has always lived in [place]. 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 No pets currently. No bird or mold exposure.      PHYSICAL EXAM  Vitals:   09/05/18 1359  BP: (!) 149/70  Pulse: 63  Weight: 165 lb 12.8 oz (75.2 kg)  Height: 6\' 2"  (1.88 m)   Body mass index is 21.29 kg/m.  Generalized: Well developed, in no acute distress  Head: normocephalic and atraumatic,. Oropharynx benign  Neck: Supple,  Musculoskeletal: No deformity   Neurological examination   Mentation: Alert oriented to time, place, history taking. Attention span and concentration appropriate. Recent and remote memory intact.  Follows all commands speech and language fluent.   Cranial nerve II-XII: Pupils were equal round reactive to light extraocular movements were full, visual field were full on confrontational test. Facial sensation and strength were normal. hearing was intact to finger rubbing bilaterally. Uvula tongue midline. head turning and shoulder shrug were normal and symmetric.Tongue protrusion into cheek strength was normal. Motor: normal bulk and tone, full strength in the BUE, BLE,  Sensory: normal and symmetric to light touch,   Coordination: finger-nose-finger, heel-to-shin bilaterally, no dysmetria Gait and Station: Rising up from seated position without assistance, normal stance,  moderate stride, good arm swing, smooth turning, no assistive device DIAGNOSTIC DATA (LABS, IMAGING, TESTING) - I reviewed patient records, labs, notes, testing and imaging myself where available.  Lab Results  Component Value Date   WBC 9.8 08/13/2018   HGB 11.9 (L) 08/13/2018   HCT 34.7 (L) 08/13/2018   MCV 92.0 08/13/2018   PLT 156 08/13/2018      Component Value Date/Time   NA 141 08/13/2018 1108   NA 141 04/20/2018 0958   K 3.6 08/13/2018 1108   CL 103 08/13/2018 1108   CO2 30 08/13/2018 1108   GLUCOSE 174 (H) 08/13/2018 1108   BUN 37 (H) 08/13/2018 1108   BUN 27  04/20/2018 0958   CREATININE 1.50 (  H) 08/13/2018 1108   CREATININE 0.98 03/12/2014 0933   CALCIUM 9.5 08/13/2018 1108   PROT 6.8 02/28/2018 1523   PROT 6.5 04/06/2017 0905   ALBUMIN 3.7 02/28/2018 1523   ALBUMIN 4.2 04/06/2017 0905   AST 22 02/28/2018 1523   ALT 23 02/28/2018 1523   ALKPHOS 61 02/28/2018 1523   BILITOT 0.5 02/28/2018 1523   BILITOT 0.5 04/06/2017 0905   GFRNONAA 41 (L) 08/13/2018 1108   GFRAA 47 (L) 08/13/2018 1108   Lab Results  Component Value Date   CHOL 179 03/31/2018   HDL 48 03/31/2018   LDLCALC 112 (H) 03/31/2018   TRIG 93 03/31/2018   CHOLHDL 3.7 03/31/2018   Lab Results  Component Value Date   HGBA1C 7.2 (H) 03/01/2018   Lab Results  Component Value Date   VITAMINB12 405 03/01/2018   Lab Results  Component Value Date   TSH 2.580 04/06/2017      ASSESSMENT AND PLAN  84 y.o. year old male  has a past medical history of Coronary atherosclerosis of native coronary artery, Diabetes mellitus without complication ([place]), Essential hypertension, benign, Hyperlipidemia, Neuropathy, NSTEMI (non-ST elevated myocardial infarction) ([place]), Prostate cancer ([place]), Sleep apnea, and Stroke ([place]). here to follow-up for obstructive sleep apnea with CPAP compliance. Data dated 08/05/2018-09/03/2018 shows compliance greater than 4 hours at 77%.  Average usage 7 hours 11 minutes.  Set pressure 11 cm.  EPR level 3.  Leak 95th percentile at 109.7.  AHI 17.1.  ESS score 18.  PLAN: CPAP compliance 77% Patient has significant leak Needs mask refit will send orde Follow-up in 3 to 4 months for repeat compliance Luke Solis, Luke Solis, [facility], Luke Solis  [facility] [address], [place], [place] [address] [phone]  I reviewed the above note and documentation by the Nurse Practitioner and agree with the history, physical exam, assessment and plan as outlined above. I was immediately available for face-to-face consultation. [name], MD,  PhD [facility])

## 2018-09-05 ENCOUNTER — Ambulatory Visit (INDEPENDENT_AMBULATORY_CARE_PROVIDER_SITE_OTHER): Payer: Medicare Other | Admitting: Nurse Practitioner

## 2018-09-05 ENCOUNTER — Encounter: Payer: Self-pay | Admitting: Nurse Practitioner

## 2018-09-05 DIAGNOSIS — Z9989 Dependence on other enabling machines and devices: Secondary | ICD-10-CM | POA: Diagnosis not present

## 2018-09-05 DIAGNOSIS — G4733 Obstructive sleep apnea (adult) (pediatric): Secondary | ICD-10-CM | POA: Diagnosis not present

## 2018-09-05 DIAGNOSIS — I6381 Other cerebral infarction due to occlusion or stenosis of small artery: Secondary | ICD-10-CM

## 2018-09-05 NOTE — Patient Instructions (Signed)
CPAP compliance 77% Patient has significant leak Needs mask refit Follow-up in 3 to 4 months for repeat compliance

## 2018-09-06 NOTE — Progress Notes (Signed)
Cpap order to [facility] (via [facility] CM). 09-06-18 sy

## 2018-09-18 DIAGNOSIS — E785 Hyperlipidemia, unspecified: Secondary | ICD-10-CM | POA: Diagnosis not present

## 2018-09-18 DIAGNOSIS — E1140 Type 2 diabetes mellitus with diabetic neuropathy, unspecified: Secondary | ICD-10-CM | POA: Diagnosis not present

## 2018-09-19 ENCOUNTER — Ambulatory Visit: Payer: Medicare Other | Admitting: Family Medicine

## 2018-09-19 ENCOUNTER — Encounter: Payer: Self-pay | Admitting: Family Medicine

## 2018-09-19 ENCOUNTER — Ambulatory Visit (INDEPENDENT_AMBULATORY_CARE_PROVIDER_SITE_OTHER): Payer: Medicare Other | Admitting: Family Medicine

## 2018-09-19 VITALS — BP 122/68 | Ht 74.0 in | Wt 167.1 lb

## 2018-09-19 DIAGNOSIS — Z23 Encounter for immunization: Secondary | ICD-10-CM | POA: Diagnosis not present

## 2018-09-19 DIAGNOSIS — I10 Essential (primary) hypertension: Secondary | ICD-10-CM | POA: Diagnosis not present

## 2018-09-19 DIAGNOSIS — I6381 Other cerebral infarction due to occlusion or stenosis of small artery: Secondary | ICD-10-CM

## 2018-09-19 LAB — BASIC METABOLIC PANEL
BUN / CREAT RATIO: 18 (ref 10–24)
BUN: 24 mg/dL (ref 8–27)
CO2: 25 mmol/L (ref 20–29)
CREATININE: 1.3 mg/dL — AB (ref 0.76–1.27)
Calcium: 9.4 mg/dL (ref 8.6–10.2)
Chloride: 103 mmol/L (ref 96–106)
GFR calc Af Amer: 58 mL/min/{1.73_m2} — ABNORMAL LOW (ref >59)
GFR, EST NON AFRICAN AMERICAN: 50 mL/min/{1.73_m2} — AB (ref >59)
GLUCOSE: 168 mg/dL — AB (ref 65–99)
Potassium: 4.1 mmol/L (ref 3.5–5.2)
Sodium: 144 mmol/L (ref 134–144)

## 2018-09-19 LAB — HEMOGLOBIN A1C
Est. average glucose Bld gHb Est-mCnc: 169 mg/dL
Hgb A1c MFr Bld: 7.5 % — ABNORMAL HIGH (ref 4.8–5.6)

## 2018-09-19 LAB — LIPID PANEL
Chol/HDL Ratio: 2.9 ratio (ref 0.0–5.0)
Cholesterol, Total: 165 mg/dL (ref 100–199)
HDL: 56 mg/dL (ref >39)
LDL Calculated: 88 mg/dL (ref 0–99)
TRIGLYCERIDES: 106 mg/dL (ref 0–149)
VLDL Cholesterol Cal: 21 mg/dL (ref 5–40)

## 2018-09-19 MED ORDER — ZOSTER VAC RECOMB ADJUVANTED 50 MCG/0.5ML IM SUSR: 0.5000 mL | Freq: Once | INTRAMUSCULAR | 1 refills | Status: AC

## 2018-09-19 NOTE — Patient Instructions (Addendum)
Stop HCTZ  Continue amlodipine 2.5 mg  Shingrix and shingles prevention: know the facts!   Shingrix is a very effective vaccine to prevent shingles.   Shingles is a reactivation of chickenpox -more than 99% of Americans born before 1980 have had chickenpox even if they do not remember it. One in every 10 people who get shingles have severe long-lasting nerve pain as a result.   33 out of a 100 older adults will get shingles if they are unvaccinated.     This vaccine is very important for your health This vaccine is indicated for anyone 50 years or older. You can get this vaccine even if you have already had shingles because you can get the disease more than once in a lifetime.  Your risk for shingles and its complications increases with age.  This vaccine has 2 doses.  The second dose would be 2 to 6 months after the first dose.  If you had Zostavax vaccine in the past you should still get Shingrix. ( Zostavax is only 70% effective and it loses significant strength over a few years .)  This vaccine is given through the pharmacy.  The cost of the vaccine is through your insurance. The pharmacy can inform you of the total costs.  Common side effects including soreness in the arm, some redness and swelling, also some feel fatigue muscle soreness headache low-grade fever.  Side effects typically go away within 2 to 3 days. Remember-the pain from shingles can last a lifetime but these side effects of the vaccine will only last a few days at most. It is very important to get both doses in order to protect yourself fully.   Please get this vaccine at your earliest convenience at your trusted pharmacy.

## 2018-09-19 NOTE — Progress Notes (Signed)
   Subjective:    Patient ID: Luke Solis, male    DOB: February 07, 1931, 84 y.o.   MRN: [id number]  HPI  Patient is here today to follow up on frequent falls.He has not fell any more since the last time he was here. Patient feels much better since we backed down on some of the medicine He has seen his neurologist who is working with him with his CPAP machine He saw his urologist who did not recommend any type of cystoscope currently they will see him back in 3 months He denies dizziness with standing denies headache fever chills sweats    Review of Systems  Constitutional: Negative for activity change, fatigue and fever.  HENT: Negative for congestion and rhinorrhea.   Respiratory: Negative for cough and shortness of breath.   Cardiovascular: Negative for chest pain and leg swelling.  Gastrointestinal: Negative for abdominal pain, diarrhea and nausea.  Genitourinary: Negative for dysuria and hematuria.  Neurological: Negative for weakness and headaches.  Psychiatric/Behavioral: Negative for agitation and behavioral problems.       Objective:   Physical Exam  Constitutional: He appears well-nourished. No distress.  HENT:  Head: Normocephalic and atraumatic.  Eyes: Right eye exhibits no discharge. Left eye exhibits no discharge.  Neck: No tracheal deviation present.  Cardiovascular: Normal rate, regular rhythm and normal heart sounds.  No murmur heard. Pulmonary/Chest: Effort normal and breath sounds normal. No respiratory distress. He has no wheezes.  Musculoskeletal: He exhibits no edema.  Lymphadenopathy:    He has no cervical adenopathy.  Neurological: He is alert.  Skin: Skin is warm. No rash noted.  Psychiatric: His behavior is normal.  Vitals reviewed.     Results for orders placed or performed in visit on 08/25/18  Urine Culture  Result Value Ref Range   Urine Culture, Routine Final report    Organism ID, Bacteria Comment   Specimen status report  Result Value Ref  Range   specimen status report Comment   POCT Urinalysis Dipstick  Result Value Ref Range   Color, UA     Clarity, UA     Glucose, UA     Bilirubin, UA +    Ketones, UA     Spec Grav, UA 1.020 1.010 - 1.025   Blood, UA +    pH, UA 5.0 5.0 - 8.0   Protein, UA     Urobilinogen, UA     Nitrite, UA     Leukocytes, UA Small (1+) (A) Negative   Appearance     Odor           Assessment & Plan:  HTN Mild orthostatic hypotension today Blood pressure 132/70 laying, 122/66 sitting, 110/60 standing Stop HCTZ Continue amlodipine 2.5 mg daily Follow-up 3 to 4 months Sooner problems Diabetes under decent control will not increase medication currently because we do not want to run the risk of having low sugars Healthy diet recommended  Lipid is pending

## 2018-10-04 ENCOUNTER — Other Ambulatory Visit: Payer: Self-pay | Admitting: Family Medicine

## 2018-10-09 ENCOUNTER — Telehealth: Payer: Self-pay | Admitting: Family Medicine

## 2018-10-09 MED ORDER — PREGABALIN 100 MG PO CAPS: 100.0000 mg | ORAL_CAPSULE | Freq: Two times a day (BID) | ORAL | 5 refills | Status: DC

## 2018-10-09 NOTE — Telephone Encounter (Signed)
May have 6 months refill 

## 2018-10-09 NOTE — Telephone Encounter (Signed)
Pharmacy requesting refill on Pregabalin 100 mg capsule. Take one capsule in the morning and 2 capsules at bedtime.

## 2018-10-09 NOTE — Addendum Note (Signed)
Addended by: Karle Barr on: 10/09/2018 01:30 PM   Modules accepted: Orders

## 2018-10-17 ENCOUNTER — Encounter: Payer: Self-pay | Admitting: Family Medicine

## 2018-10-17 ENCOUNTER — Ambulatory Visit (INDEPENDENT_AMBULATORY_CARE_PROVIDER_SITE_OTHER): Payer: Medicare Other | Admitting: Family Medicine

## 2018-10-17 VITALS — BP 128/86 | Temp 97.5°F | Ht 74.0 in | Wt 168.2 lb

## 2018-10-17 DIAGNOSIS — R5383 Other fatigue: Secondary | ICD-10-CM | POA: Diagnosis not present

## 2018-10-17 DIAGNOSIS — D508 Other iron deficiency anemias: Secondary | ICD-10-CM

## 2018-10-17 DIAGNOSIS — I6381 Other cerebral infarction due to occlusion or stenosis of small artery: Secondary | ICD-10-CM

## 2018-10-17 NOTE — Progress Notes (Signed)
   Subjective:    Patient ID: Luke Solis, male    DOB: 05-11-1931, 84 y.o.   MRN: [id number]  HPI Pt here today for low energy. Going on since this weekend and pt told wife that he felt weak. Pt stayed in bed until 9 am this morning. Eating well; pt is not drinking plenty of fluids. Pt had little bit of headache when he woke up Saturday. No trouble breathing. Pt CPAP is leaking.  Significant fatigue tiredness States he is eating okay bowels are moving urinating okay no blood Denies chest tightness pressure pain shortness of breath Relates fatigue tiredness no energy CPAP not working well they will be working with the specialist regarding this No low blood sugars No low blood pressure  Review of Systems  Constitutional: Positive for fatigue. Negative for activity change and appetite change.  HENT: Negative for congestion and rhinorrhea.   Respiratory: Negative for cough and shortness of breath.   Cardiovascular: Negative for chest pain and leg swelling.  Gastrointestinal: Negative for abdominal pain, nausea and vomiting.  Neurological: Positive for weakness. Negative for dizziness and headaches.  Psychiatric/Behavioral: Negative for agitation and behavioral problems.       Objective:   Physical Exam  Constitutional: He appears well-nourished.  Cardiovascular: Normal rate, regular rhythm and normal heart sounds.  No murmur heard. Pulmonary/Chest: Effort normal and breath sounds normal.  Musculoskeletal: He exhibits no edema.  Lymphadenopathy:    He has no cervical adenopathy.  Neurological: He is alert.  Psychiatric: His behavior is normal.  Vitals reviewed.    Orthostatics taken were negative     Assessment & Plan:  Hemoglobin slightly low we will go ahead and do CBC and ferritin may need further work-up depending on those this result  CPAP not working well will message his specialist if there equipment supplier can work with him in order to get a better mask

## 2018-10-18 LAB — CBC WITH DIFFERENTIAL/PLATELET
BASOS ABS: 0 10*3/uL (ref 0.0–0.2)
Basos: 1 %
EOS (ABSOLUTE): 0.2 10*3/uL (ref 0.0–0.4)
Eos: 3 %
HEMOGLOBIN: 12.4 g/dL — AB (ref 13.0–17.7)
Hematocrit: 36.7 % — ABNORMAL LOW (ref 37.5–51.0)
Immature Grans (Abs): 0 10*3/uL (ref 0.0–0.1)
Immature Granulocytes: 1 %
LYMPHS ABS: 1.1 10*3/uL (ref 0.7–3.1)
Lymphs: 20 %
MCH: 31.3 pg (ref 26.6–33.0)
MCHC: 33.8 g/dL (ref 31.5–35.7)
MCV: 93 fL (ref 79–97)
MONOS ABS: 0.4 10*3/uL (ref 0.1–0.9)
Monocytes: 8 %
NEUTROS ABS: 3.8 10*3/uL (ref 1.4–7.0)
Neutrophils: 67 %
PLATELETS: 190 10*3/uL (ref 150–450)
RBC: 3.96 x10E6/uL — AB (ref 4.14–5.80)
RDW: 11.7 % — ABNORMAL LOW (ref 12.3–15.4)
WBC: 5.6 10*3/uL (ref 3.4–10.8)

## 2018-10-18 LAB — IRON AND TIBC
Iron Saturation: 31 % (ref 15–55)
Iron: 89 ug/dL (ref 38–169)
TIBC: 284 ug/dL (ref 250–450)
UIBC: 195 ug/dL (ref 111–343)

## 2018-10-18 LAB — POCT HEMOGLOBIN: HEMOGLOBIN: 10.4 g/dL (ref 9.5–13.5)

## 2018-10-18 LAB — FERRITIN: FERRITIN: 55 ng/mL (ref 30–400)

## 2018-10-18 NOTE — Progress Notes (Signed)
HgB has been resulted. I apologize.

## 2018-10-23 ENCOUNTER — Ambulatory Visit: Payer: Medicare Other | Admitting: Family Medicine

## 2018-10-25 DIAGNOSIS — M79672 Pain in left foot: Secondary | ICD-10-CM | POA: Diagnosis not present

## 2018-10-25 DIAGNOSIS — L609 Nail disorder, unspecified: Secondary | ICD-10-CM | POA: Diagnosis not present

## 2018-10-25 DIAGNOSIS — E1140 Type 2 diabetes mellitus with diabetic neuropathy, unspecified: Secondary | ICD-10-CM | POA: Diagnosis not present

## 2018-10-25 DIAGNOSIS — M79671 Pain in right foot: Secondary | ICD-10-CM | POA: Diagnosis not present

## 2018-12-22 ENCOUNTER — Encounter: Payer: Self-pay | Admitting: Family Medicine

## 2018-12-22 ENCOUNTER — Ambulatory Visit (HOSPITAL_COMMUNITY)
Admission: RE | Admit: 2018-12-22 | Discharge: 2018-12-22 | Disposition: A | Discharge status: Home / Self Care | Payer: Medicare Other | Source: Ambulatory Visit | Attending: Family Medicine | Admitting: Family Medicine

## 2018-12-22 ENCOUNTER — Encounter (HOSPITAL_COMMUNITY): Payer: Self-pay

## 2018-12-22 ENCOUNTER — Ambulatory Visit (INDEPENDENT_AMBULATORY_CARE_PROVIDER_SITE_OTHER): Payer: Medicare Other | Admitting: Family Medicine

## 2018-12-22 VITALS — BP 120/78 | Temp 97.9°F | Ht 74.0 in | Wt 165.0 lb

## 2018-12-22 DIAGNOSIS — R81 Glycosuria: Secondary | ICD-10-CM | POA: Insufficient documentation

## 2018-12-22 DIAGNOSIS — R5383 Other fatigue: Secondary | ICD-10-CM

## 2018-12-22 DIAGNOSIS — E1140 Type 2 diabetes mellitus with diabetic neuropathy, unspecified: Secondary | ICD-10-CM

## 2018-12-22 DIAGNOSIS — M545 Low back pain, unspecified: Secondary | ICD-10-CM

## 2018-12-22 DIAGNOSIS — M47816 Spondylosis without myelopathy or radiculopathy, lumbar region: Secondary | ICD-10-CM | POA: Diagnosis not present

## 2018-12-22 DIAGNOSIS — C61 Malignant neoplasm of prostate: Secondary | ICD-10-CM | POA: Insufficient documentation

## 2018-12-22 DIAGNOSIS — M25551 Pain in right hip: Secondary | ICD-10-CM | POA: Diagnosis not present

## 2018-12-22 DIAGNOSIS — R319 Hematuria, unspecified: Secondary | ICD-10-CM | POA: Diagnosis not present

## 2018-12-22 DIAGNOSIS — R350 Frequency of micturition: Secondary | ICD-10-CM | POA: Diagnosis not present

## 2018-12-22 LAB — POCT GLUCOSE (DEVICE FOR HOME USE): GLUCOSE FASTING, POC: 240 mg/dL — AB (ref 70–99)

## 2018-12-22 LAB — POCT URINALYSIS DIPSTICK
GLUCOSE UA: POSITIVE — AB
PROTEIN UA: POSITIVE — AB
Spec Grav, UA: 1.02 (ref 1.010–1.025)
pH, UA: 6 (ref 5.0–8.0)

## 2018-12-22 MED ORDER — CEPHALEXIN 500 MG PO CAPS: 500.0000 mg | ORAL_CAPSULE | Freq: Four times a day (QID) | ORAL | 0 refills | Status: DC

## 2018-12-22 NOTE — Progress Notes (Signed)
Subjective:    Patient ID: Luke Solis, male    DOB: 27-Dec-1930, 85 y.o.   MRN: [id number]  Urinary Frequency   This is a new problem. Episode onset: last night. Associated symptoms include frequency. Pertinent negatives include no hematuria or nausea. Associated symptoms comments: Blood coming out of penis 5 days ago. Slow with thinking since last night and more than usual frequent urination. Marland Kitchen   His wife is concerned about how he is doing he has had some periods of time where he just sits and does not really respond well he has other times where he is got up to do things in the middle night and just stood there not really responding or doing appropriately so therefore she feels she is worried about the could have been a stroke or some other issue going on  He denies any fevers chills sweats denies nausea vomiting diarrhea does relate he has had a couple times of blood dripping and at the end of his penis he has a history of prostate cancer Results for orders placed or performed in visit on 12/22/18  POCT urinalysis dipstick  Result Value Ref Range   Color, UA     Clarity, UA     Glucose, UA Positive (A) Negative   Bilirubin, UA +    Ketones, UA     Spec Grav, UA 1.020 1.010 - 1.025   Blood, UA     pH, UA 6.0 5.0 - 8.0   Protein, UA Positive (A) Negative   Urobilinogen, UA     Nitrite, UA     Leukocytes, UA     Appearance     Odor     Results for orders placed or performed in visit on 12/22/18  POCT urinalysis dipstick  Result Value Ref Range   Color, UA     Clarity, UA     Glucose, UA Positive (A) Negative   Bilirubin, UA +    Ketones, UA     Spec Grav, UA 1.020 1.010 - 1.025   Blood, UA     pH, UA 6.0 5.0 - 8.0   Protein, UA Positive (A) Negative   Urobilinogen, UA     Nitrite, UA     Leukocytes, UA     Appearance     Odor    POCT Glucose (Device for Home Use)  Result Value Ref Range   Glucose Fasting, POC 240 (A) 70 - 99 mg/dL   POC Glucose    No recent colds  congestion no recent falls or injuries  Review of Systems  Constitutional: Negative for activity change, fatigue and fever.  HENT: Negative for congestion and rhinorrhea.   Respiratory: Negative for cough and shortness of breath.   Cardiovascular: Negative for chest pain and leg swelling.  Gastrointestinal: Negative for abdominal pain, diarrhea and nausea.  Genitourinary: Positive for frequency. Negative for dysuria and hematuria.  Neurological: Negative for weakness and headaches.  Psychiatric/Behavioral: Negative for agitation and behavioral problems.       Objective:   Physical Exam Vitals signs reviewed.  Constitutional:      General: He is not in acute distress. HENT:     Head: Normocephalic and atraumatic.  Eyes:     General:        Right eye: No discharge.        Left eye: No discharge.  Neck:     Trachea: No tracheal deviation.  Cardiovascular:     Rate and Rhythm: Normal rate  and regular rhythm.     Heart sounds: Normal heart sounds. No murmur.  Pulmonary:     Effort: Pulmonary effort is normal. No respiratory distress.     Breath sounds: Normal breath sounds.  Lymphadenopathy:     Cervical: No cervical adenopathy.  Skin:    General: Skin is warm and dry.  Neurological:     Mental Status: He is alert.     Coordination: Coordination normal.  Psychiatric:        Behavior: Behavior normal.           Assessment & Plan:  Hematuria- more than likely related to his prostate We will culture the urine We will go ahead and start the antibiotics 4 times daily Do the antibiotics over the next 7 days  Diabetes subpar control watch diet continue current medication check lab work more than likely will need to add additional medicine  Prostate cancer treated by seeds having hip pain and back pain we will do x-rays and look at this  Significant fatigue check CBC he has history of anemia  Cognitive dysfunction actually patient doing pretty good today I believe is  just more physical fatigue related to his above illness  25 minutes was spent with the patient.  This statement verifies that 25 minutes was indeed spent with the patient.  More than 50% of this visit-total duration of the visit-was spent in counseling and coordination of care. The issues that the patient came in for today as reflected in the diagnosis (s) please refer to documentation for further details.

## 2018-12-23 LAB — CBC WITH DIFFERENTIAL/PLATELET
BASOS: 1 %
Basophils Absolute: 0.1 10*3/uL (ref 0.0–0.2)
EOS (ABSOLUTE): 0.3 10*3/uL (ref 0.0–0.4)
Eos: 5 %
Hematocrit: 36.5 % — ABNORMAL LOW (ref 37.5–51.0)
Hemoglobin: 12.8 g/dL — ABNORMAL LOW (ref 13.0–17.7)
IMMATURE GRANS (ABS): 0 10*3/uL (ref 0.0–0.1)
Immature Granulocytes: 0 %
LYMPHS ABS: 1.6 10*3/uL (ref 0.7–3.1)
LYMPHS: 24 %
MCH: 31.1 pg (ref 26.6–33.0)
MCHC: 35.1 g/dL (ref 31.5–35.7)
MCV: 89 fL (ref 79–97)
Monocytes Absolute: 0.6 10*3/uL (ref 0.1–0.9)
Monocytes: 8 %
NEUTROS ABS: 4.2 10*3/uL (ref 1.4–7.0)
Neutrophils: 62 %
Platelets: 209 10*3/uL (ref 150–450)
RBC: 4.11 x10E6/uL — ABNORMAL LOW (ref 4.14–5.80)
RDW: 11.6 % (ref 11.6–15.4)
WBC: 6.8 10*3/uL (ref 3.4–10.8)

## 2018-12-23 LAB — BASIC METABOLIC PANEL
BUN / CREAT RATIO: 15 (ref 10–24)
BUN: 21 mg/dL (ref 8–27)
CALCIUM: 9.7 mg/dL (ref 8.6–10.2)
CO2: 27 mmol/L (ref 20–29)
Chloride: 103 mmol/L (ref 96–106)
Creatinine, Ser: 1.43 mg/dL — ABNORMAL HIGH (ref 0.76–1.27)
GFR, EST AFRICAN AMERICAN: 51 mL/min/{1.73_m2} — AB (ref >59)
GFR, EST NON AFRICAN AMERICAN: 44 mL/min/{1.73_m2} — AB (ref >59)
Glucose: 258 mg/dL — ABNORMAL HIGH (ref 65–99)
POTASSIUM: 3.8 mmol/L (ref 3.5–5.2)
Sodium: 144 mmol/L (ref 134–144)

## 2018-12-23 LAB — HEMOGLOBIN A1C
ESTIMATED AVERAGE GLUCOSE: 186 mg/dL
HEMOGLOBIN A1C: 8.1 % — AB (ref 4.8–5.6)

## 2018-12-24 LAB — SPECIMEN STATUS REPORT

## 2018-12-24 LAB — URINE CULTURE: Organism ID, Bacteria: NO GROWTH

## 2018-12-25 ENCOUNTER — Other Ambulatory Visit: Payer: Self-pay | Admitting: *Deleted

## 2018-12-25 MED ORDER — METFORMIN HCL 500 MG PO TABS: 500.0000 mg | ORAL_TABLET | Freq: Two times a day (BID) | ORAL | 1 refills | Status: DC

## 2018-12-25 MED ORDER — SITAGLIPTIN PHOSPHATE 50 MG PO TABS: 50.0000 mg | ORAL_TABLET | Freq: Every day | ORAL | 1 refills | Status: DC

## 2018-12-27 ENCOUNTER — Telehealth: Payer: Self-pay | Admitting: *Deleted

## 2018-12-27 NOTE — Telephone Encounter (Signed)
Insurance denied coverage of januvia due to medication not being on formulary. Insurance prefers Ongyza and Tradjenta Please advise

## 2018-12-28 ENCOUNTER — Other Ambulatory Visit: Payer: Self-pay | Admitting: *Deleted

## 2018-12-28 ENCOUNTER — Telehealth: Payer: Self-pay | Admitting: *Deleted

## 2018-12-28 MED ORDER — LINAGLIPTIN 5 MG PO TABS: 5.0000 mg | ORAL_TABLET | Freq: Every day | ORAL | 5 refills | Status: DC

## 2018-12-28 NOTE — Telephone Encounter (Signed)
Left message to return call 

## 2018-12-28 NOTE — Telephone Encounter (Signed)
So unfortunately modern medicine is not well covered by his insurance  I would recommend that the patient do a better job of minimizing processed foods and sugary foods in his diet I recommend that he check his sugars periodically A few of the should be in the morning time in couple times a week before supper or bedtime These should be kept on a written list Bring these with him I recommend the patient do a follow-up office visit in 2 weeks and we can review over how his sugars are Then we can discuss if there are other options He is currently taking the Metformin he should stick with the current dosing

## 2018-12-28 NOTE — Telephone Encounter (Signed)
Discussed with pt. Pt verbalized understanding. rx sent to pharm with a note to cancel [place]

## 2018-12-28 NOTE — Telephone Encounter (Signed)
Pharmacy called to state the Tragenta and Onglyza have a $400 copay and patient does not want these meds with that copay- Please advise

## 2018-12-28 NOTE — Telephone Encounter (Signed)
I recommend Tradjenta 5 mg 1 daily, #30, 5 refills

## 2018-12-29 NOTE — Telephone Encounter (Signed)
Left message to return call 

## 2018-12-29 NOTE — Telephone Encounter (Signed)
See result note also 

## 2019-01-01 DIAGNOSIS — N35014 Post-traumatic urethral stricture, male, unspecified: Secondary | ICD-10-CM | POA: Diagnosis not present

## 2019-01-01 DIAGNOSIS — R972 Elevated prostate specific antigen [PSA]: Secondary | ICD-10-CM | POA: Diagnosis not present

## 2019-01-01 DIAGNOSIS — N3040 Irradiation cystitis without hematuria: Secondary | ICD-10-CM | POA: Diagnosis not present

## 2019-01-01 DIAGNOSIS — C61 Malignant neoplasm of prostate: Secondary | ICD-10-CM | POA: Diagnosis not present

## 2019-01-03 NOTE — Telephone Encounter (Signed)
Patient is aware of all and was transferred up front to set up follow appt with Dr. Nicki Reaper for bloods sugars.

## 2019-01-04 NOTE — Progress Notes (Addendum)
[facility]  PATIENT: DOW BLAHNIK DOB: 1931-08-03   REASON FOR VISIT: Follow-up for  obstructive sleep apnea with CPAP compliance HISTORY FROM: Patient   HISTORY OF PRESENT ILLNESS:UPDATE 2/3/2020CM Mr. Crisostomo, 85 year old male returns for follow-up with a history of obstructive sleep apnea here for CPAP compliance.  When last seen he had just obtained a new machine but had a significant leak.  He did not follow-up to get a new mask refit.  Compliance data dated 12/09/2018-01/07/2019 shows compliance greater than 4 hours at 73%.  Less than 4 hours 27%.  Average usage 5 hours 38 minutes.  Set pressure 11 cm EPR level 3 leak 95th percentile 73.5 AHI 18 ESS score 14.  He returns for reevaluation    UPDATE 10/1/2019CM Mr. Reta, 85 year old male returns for follow-up with history of obstructive sleep apnea here for CPAP compliance he has just obtained a new machine.  Wife reports he has a significant leak blows on her at night.  Compliance data dated 08/05/2018-09/03/2018 shows compliance greater than 4 hours at 77%.  Average usage 7 hours 11 minutes.  Set pressure 11 cm.  EPR level 3.  Leak 95th percentile at 109.7.  AHI 17.1.  ESS score 18.  He returns for reevaluation 05/03/18 SAMr. Mceuen is an 85 year old right-handed gentleman with an underlying medical history of stroke (followed by Dr. [name]), prostate cancer, diabetes, neuropathy, hyperlipidemia, non-STEMI, and hypertension, who was previously diagnosed with obstructive sleep apnea previously followed with Dr. [name] in pulmonology for this. His last office visit for sleep apnea management was in October 2018 and I reviewed that note. He was recently restarted on CPAP therapy. Sleep study testing was in or around 2006. He was having trouble tolerating the mask and pressure. His residual AHI was also elevated at 18.8, he was on AutoPap. Prior sleep study results are not available for my review today. He has an older CPAP machine. I  reviewed his most recent compliance data from the past 90 days. Of note, he has not been using his CPAP since approximately 02/26/2018. He reports that he has had to travel back and forth, his son recently had a heart attack. He has an AutoPap machine with a pressure range of 8 cm to 15 cm without EPR. 95th percentile pressure is 14.2, leak is extremely high with the 95th percentile at 102 L/m. Residual AHI is also increased at 13.6 per hour. He has used his AutoPap in the past 90 days for 34 days. Average usage for days on treatment is 4 hours and 53 minutes. He has daytime somnolence. He recently received parts for this machine. He is married and lives with his wife, he is retired. They have 4 children. He is a nonsmoker and does not utilize alcohol and does not utilize caffeine on a regular basis. His Epworth sleepiness score is 24, fatigue score is 48 out of 63. He has some residual slowness in thinking since the stroke, not so much L sided weakness, also complains of overall fatigue. Blood sugars have been up lately, but A1c has been improving in the last 9 months. He is not sure that he wants to have another sleep study, he is not sure that he will be able to use CPAP. Nevertheless, he is willing to think about it. His wife reports that he has been sleepy for quite some time, unfortunately, sleep is also quite interrupted because of his significant nocturia. He reports that since his prostate cancer treatment he has  to get up and use the bathroom at night several times, on average 5-6 times. Bedtime is around 11 and rise time around 8. He has been using a large F 20 full face mask. He has had daytime somnolence for years. This is why he had his first sleep study several years ago and when he was able to use CPAP he improved in terms of daytime somnolence. He has no severe sleepiness dating back to teenage years or college age. He has no family history of narcolepsy.  REVIEW OF SYSTEMS: Full 14 system  review of systems performed and notable only for those listed, all others are neg:  Constitutional: neg  Cardiovascular: neg Ear/Nose/Throat: neg  Skin: neg Eyes: neg Respiratory: neg Gastroitestinal: neg  Hematology/Lymphatic: neg  Endocrine: neg Musculoskeletal:neg Allergy/Immunology: neg Neurological: neg Psychiatric: neg Sleep : Obstructive sleep apnea with CPAP   ALLERGIES: Allergies  Allergen Reactions  . Broccoli [Brassica Oleracea Italica] Nausea And Vomiting  . Celery Oil Nausea And Vomiting    HOME MEDICATIONS: Outpatient Medications Prior to Visit  Medication Sig Dispense Refill  . aspirin EC 325 MG tablet Take 1 tablet (325 mg total) by mouth daily. 100 tablet 3  . Calcium Carbonate-Vitamin D 600-400 MG-UNIT per tablet Take 1 tablet by mouth daily.    Marland Kitchen FORACARE PREMIUM V10 TEST test strip USE AS DIRECTED. 50 each 0  . linagliptin (TRADJENTA) 5 MG TABS tablet Take 1 tablet (5 mg total) by mouth daily. 30 tablet 5  . metFORMIN (GLUCOPHAGE) 500 MG tablet Take 1 tablet (500 mg total) by mouth 2 (two) times daily with a meal. 180 tablet 1  . Multiple Vitamin (MULTIVITAMIN) tablet Take 1 tablet by mouth daily.    . nitroGLYCERIN (NITROSTAT) 0.4 MG SL tablet PLACE 1 TABLET UNDER TONGUE EVERY 5 MINTUES UPTO 3 TIMES AS NEEDED FOR CHEST PAIN. 28 tablet 4  . pregabalin (LYRICA) 100 MG capsule Take 1 capsule (100 mg total) by mouth 2 (two) times daily. TAKE 1 CAPSULE IN THE MORNING AND 2 CAPSULES AT BEDTIME. 90 capsule 5  . rosuvastatin (CRESTOR) 40 MG tablet TAKE (1) TABLET BY MOUTH ONCE DAILY. 30 tablet 11  . ALLERGY RELIEF 180 MG tablet TAKE 1 TABLET ONCE DAILY AS NEEDED FOR ALLERGIES. (Patient not taking: Reported on 01/08/2019) 30 tablet 5  . amLODipine (NORVASC) 2.5 MG tablet Take 1 tablet (2.5 mg total) by mouth daily. 30 tablet 5  . cephALEXin (KEFLEX) 500 MG capsule Take 1 capsule (500 mg total) by mouth 4 (four) times daily. 28 capsule 0   No facility-administered  medications prior to visit.     PAST MEDICAL HISTORY: Past Medical History:  Diagnosis Date  . Coronary atherosclerosis of native coronary artery    BMS proximal and mid RCA as well as BMS circumflex - 2002 (had residual 70% distal LAD), LVEF 60%  . Diabetes mellitus without complication ([place])   . Essential hypertension, benign   . Hyperlipidemia   . Neuropathy   . NSTEMI (non-ST elevated myocardial infarction) ([place])    2002  . Prostate cancer ([place])   . Sleep apnea    On CPAP  . Stroke [facility])     PAST SURGICAL HISTORY: Past Surgical History:  Procedure Laterality Date  . COLONOSCOPY    . INGUINAL HERNIA REPAIR    . KNEE SURGERY    . NOSE SURGERY    . PROSTATE SURGERY    . TONSILLECTOMY      FAMILY HISTORY: Family History  Problem Relation Age of Onset  . Heart attack Mother   . Diabetes Mother   . Coronary artery disease Father   . Hypertension Father   . Heart attack Father   . Stroke Maternal Grandfather   . Lung disease Neg Hx   . Cancer Neg Hx     SOCIAL HISTORY: Social History   Socioeconomic History  . Marital status: Married    Spouse name: Not on file  . Number of children: Not on file  . Years of education: Not on file  . Highest education level: Not on file  Occupational History  . Not on file  Social Needs  . Financial resource strain: Not on file  . Food insecurity:    Worry: Not on file    Inability: Not on file  . Transportation needs:    Medical: Not on file    Non-medical: Not on file  Tobacco Use  . Smoking status: Never Smoker  . Smokeless tobacco: Never Used  Substance and Sexual Activity  . Alcohol use: No    Alcohol/week: 0.0 standard drinks  . Drug use: No  . Sexual activity: Not on file  Lifestyle  . Physical activity:    Days per week: Not on file    Minutes per session: Not on file  . Stress: Not on file  Relationships  . Social connections:    Talks on phone: Not on file    Gets together: Not on file    Attends  religious service: Not on file    Active member of club or organization: Not on file    Attends meetings of clubs or organizations: Not on file    Relationship status: Not on file  . Intimate partner violence:    Fear of current or ex partner: Not on file    Emotionally abused: Not on file    Physically abused: Not on file    Forced sexual activity: Not on file  Other Topics Concern  . Not on file  Social History Narrative   [facility] (08/03/17):   Originally from [place] [facility]. Has always lived in [place]. 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 No pets currently. No bird or mold exposure.      PHYSICAL EXAM  Vitals:   01/08/19 1036  BP: 138/72  Pulse: (!) 59  Weight: 168 lb 12.8 oz (76.6 kg)  Height: 6\' 2"  (1.88 m)   Body mass index is 21.67 kg/m.  Generalized: Well developed, in no acute distress  Head: normocephalic and atraumatic,. Oropharynx benign  Neck: Supple,  Musculoskeletal: No deformity   Neurological examination   Mentation: Alert oriented to time, place, history taking. Attention span and concentration appropriate. Recent and remote memory intact.  Follows all commands speech and language fluent.   Cranial nerve II-XII: Pupils were equal round reactive to light extraocular movements were full, visual field were full on confrontational test. Facial sensation and strength were normal. hearing was intact to finger rubbing bilaterally. Uvula tongue midline. head turning and shoulder shrug were normal and symmetric.Tongue protrusion into cheek strength was normal. Motor: normal bulk and tone, full strength in the BUE, BLE,  Sensory: normal and symmetric to light touch,   Coordination: finger-nose-finger, heel-to-shin bilaterally, no dysmetria Gait and Station: Rising up from seated position without assistance, normal stance,  moderate stride, good arm swing, smooth turning, no assistive device DIAGNOSTIC DATA (LABS, IMAGING, TESTING) - I reviewed  patient records, labs, notes, testing and imaging myself  where available.  Lab Results  Component Value Date   WBC 6.8 12/22/2018   HGB 12.8 (L) 12/22/2018   HCT 36.5 (L) 12/22/2018   MCV 89 12/22/2018   PLT 209 12/22/2018      Component Value Date/Time   NA 144 12/22/2018 1632   K 3.8 12/22/2018 1632   CL 103 12/22/2018 1632   CO2 27 12/22/2018 1632   GLUCOSE 258 (H) 12/22/2018 1632   GLUCOSE 174 (H) 08/13/2018 1108   BUN 21 12/22/2018 1632   CREATININE 1.43 (H) 12/22/2018 1632   CREATININE 0.98 03/12/2014 0933   CALCIUM 9.7 12/22/2018 1632   PROT 6.8 02/28/2018 1523   PROT 6.5 04/06/2017 0905   ALBUMIN 3.7 02/28/2018 1523   ALBUMIN 4.2 04/06/2017 0905   AST 22 02/28/2018 1523   ALT 23 02/28/2018 1523   ALKPHOS 61 02/28/2018 1523   BILITOT 0.5 02/28/2018 1523   BILITOT 0.5 04/06/2017 0905   GFRNONAA 44 (L) 12/22/2018 1632   GFRAA 51 (L) 12/22/2018 1632   Lab Results  Component Value Date   CHOL 165 09/18/2018   HDL 56 09/18/2018   LDLCALC 88 09/18/2018   TRIG 106 09/18/2018   CHOLHDL 2.9 09/18/2018   Lab Results  Component Value Date   HGBA1C 8.1 (H) 12/22/2018   Lab Results  Component Value Date   VITAMINB12 405 03/01/2018   Lab Results  Component Value Date   TSH 2.580 04/06/2017      ASSESSMENT AND PLAN  85 y.o. year old male  has a past medical history of Coronary atherosclerosis of native coronary artery, Diabetes mellitus without complication ([place]), Essential hypertension, benign, Hyperlipidemia, Neuropathy, NSTEMI (non-ST elevated myocardial infarction) ([place]), Prostate cancer ([place]), Sleep apnea, and Stroke ([place]). here to follow-up for obstructive sleep apnea with CPAP compliance.  Compliance data dated 12/09/2018-01/07/2019 shows compliance greater than 4 hours at 73%.  Less than 4 hours 27%.  Average usage 5 hours 38 minutes.  Set pressure 11 cm EPR level 3 leak 95th percentile 73.5 AHI 18 ESS score 14.   PLAN: CPAP compliance 73% Patient has  significant leak Needs mask refit will send order to [place] care Follow-up in 4 months for repeat compliance Dennie Bible, [facility], [facility], APRN  [facility] [address], [place], [place] [address] [phone]  I reviewed the above note and documentation by the Nurse Practitioner and agree with the history, physical exam, assessment and plan as outlined above. I was immediately available for face-to-face consultation. [name], MD, PhD [facility])

## 2019-01-07 ENCOUNTER — Encounter: Payer: Self-pay | Admitting: Neurology

## 2019-01-08 ENCOUNTER — Encounter: Payer: Self-pay | Admitting: Nurse Practitioner

## 2019-01-08 ENCOUNTER — Ambulatory Visit (INDEPENDENT_AMBULATORY_CARE_PROVIDER_SITE_OTHER): Payer: Medicare Other | Admitting: Nurse Practitioner

## 2019-01-08 VITALS — BP 138/72 | HR 59 | Ht 74.0 in | Wt 168.8 lb

## 2019-01-08 DIAGNOSIS — G4733 Obstructive sleep apnea (adult) (pediatric): Secondary | ICD-10-CM

## 2019-01-08 DIAGNOSIS — Z9989 Dependence on other enabling machines and devices: Secondary | ICD-10-CM

## 2019-01-08 NOTE — Progress Notes (Signed)
[name]!   I've sent this into the Resupply team. I'm not sure if someone from [facility] can help him with this without it going through our Resupply team first.

## 2019-01-08 NOTE — Patient Instructions (Signed)
CPAP compliance 73% Patient has significant leak Needs mask refit will send order Follow-up in 4 months for repeat compliance

## 2019-01-08 NOTE — Progress Notes (Signed)
Community message sent to [employer] and Darlina Guys  [facility] for supplies at [address] location.

## 2019-01-10 ENCOUNTER — Encounter: Payer: Self-pay | Admitting: Family Medicine

## 2019-01-10 ENCOUNTER — Ambulatory Visit (INDEPENDENT_AMBULATORY_CARE_PROVIDER_SITE_OTHER): Payer: Medicare Other | Admitting: Family Medicine

## 2019-01-10 VITALS — BP 148/82 | Ht 74.0 in | Wt 168.0 lb

## 2019-01-10 DIAGNOSIS — N289 Disorder of kidney and ureter, unspecified: Secondary | ICD-10-CM

## 2019-01-10 DIAGNOSIS — E1140 Type 2 diabetes mellitus with diabetic neuropathy, unspecified: Secondary | ICD-10-CM

## 2019-01-10 NOTE — Progress Notes (Signed)
   Subjective:    Patient ID: Luke Solis, male    DOB: 12-22-1930, 85 y.o.   MRN: [id number]  HPI Patient is here today to discuss his blood sugars. Sugars are actually doing better now that He recently had an A1c done on 12/22/2018 at 8.1. Plan to recheck lab work again toward May or June He takes Metformin 500 mg Two po bid, supposed to be on Tradjenta 5 mg it was too expensive. Gave information on [email or web address] and patient assistance through manufacturer    Review of Systems  Constitutional: Negative for diaphoresis and fatigue.  HENT: Negative for congestion and rhinorrhea.   Respiratory: Negative for cough and shortness of breath.   Cardiovascular: Negative for chest pain and leg swelling.  Gastrointestinal: Negative for abdominal pain and diarrhea.  Skin: Negative for color change and rash.  Neurological: Negative for dizziness and headaches.  Psychiatric/Behavioral: Negative for behavioral problems and confusion.       Objective:   Physical Exam Vitals signs reviewed.  Constitutional:      General: He is not in acute distress. HENT:     Head: Normocephalic and atraumatic.  Eyes:     General:        Right eye: No discharge.        Left eye: No discharge.  Neck:     Trachea: No tracheal deviation.  Cardiovascular:     Rate and Rhythm: Normal rate and regular rhythm.     Heart sounds: Normal heart sounds. No murmur.  Pulmonary:     Effort: Pulmonary effort is normal. No respiratory distress.     Breath sounds: Normal breath sounds.  Lymphadenopathy:     Cervical: No cervical adenopathy.  Skin:    General: Skin is warm and dry.  Neurological:     Mental Status: He is alert.     Coordination: Coordination normal.  Psychiatric:        Behavior: Behavior normal.           Assessment & Plan:  Renal insufficiency check lab work again in 2 to 3 weeks well-hydrated  Diabetes improving control watch diet continue medication cannot afford more higher tech  medicine at this point.  Follow-up in approximately 4 months sooner problems

## 2019-01-17 DIAGNOSIS — L609 Nail disorder, unspecified: Secondary | ICD-10-CM | POA: Diagnosis not present

## 2019-01-17 DIAGNOSIS — E1140 Type 2 diabetes mellitus with diabetic neuropathy, unspecified: Secondary | ICD-10-CM | POA: Diagnosis not present

## 2019-01-17 DIAGNOSIS — M79671 Pain in right foot: Secondary | ICD-10-CM | POA: Diagnosis not present

## 2019-01-17 DIAGNOSIS — M79672 Pain in left foot: Secondary | ICD-10-CM | POA: Diagnosis not present

## 2019-01-23 ENCOUNTER — Encounter: Payer: Self-pay | Admitting: Family Medicine

## 2019-01-23 ENCOUNTER — Ambulatory Visit (INDEPENDENT_AMBULATORY_CARE_PROVIDER_SITE_OTHER): Payer: Medicare Other | Admitting: Family Medicine

## 2019-01-23 VITALS — BP 128/74 | Temp 97.4°F | Wt 166.2 lb

## 2019-01-23 DIAGNOSIS — R296 Repeated falls: Secondary | ICD-10-CM | POA: Diagnosis not present

## 2019-01-23 DIAGNOSIS — R2689 Other abnormalities of gait and mobility: Secondary | ICD-10-CM | POA: Diagnosis not present

## 2019-01-23 NOTE — Progress Notes (Signed)
   Subjective:    Patient ID: Luke Solis, male    DOB: July 30, 1931, 85 y.o.   MRN: [id number]  Fall  The accident occurred 3 to 5 days ago. Pertinent negatives include no abdominal pain, headaches, hematuria, nausea or vomiting. He has tried acetaminophen for the symptoms. The treatment provided mild relief.   Pt wife states that patient fell in bathroom Friday night while drying feet off.   On Sunday the patient had heavy bag on left arm and a milkshake in each hand. Pt was trying to use key to get inside house and fell in the bush beside the porch. Pt wife states that he fell with his head in the bush and torso on walk way. Pt wife states he was sore but pt states no pain.   Pt lost balance today.    Review of Systems  Constitutional: Negative for activity change.  HENT: Negative for congestion and rhinorrhea.   Respiratory: Negative for cough and shortness of breath.   Cardiovascular: Negative for chest pain.  Gastrointestinal: Negative for abdominal pain, diarrhea, nausea and vomiting.  Genitourinary: Negative for dysuria and hematuria.  Neurological: Negative for weakness and headaches.  Psychiatric/Behavioral: Negative for behavioral problems and confusion.       Objective:   Physical Exam Vitals signs reviewed.  Cardiovascular:     Rate and Rhythm: Normal rate and regular rhythm.     Heart sounds: Normal heart sounds. No murmur.  Pulmonary:     Effort: Pulmonary effort is normal.     Breath sounds: Normal breath sounds.  Lymphadenopathy:     Cervical: No cervical adenopathy.  Neurological:     Mental Status: He is alert.  Psychiatric:        Behavior: Behavior normal.    Blood pressure was checked with laying sitting standing no appreciable changes  Patient walks with a shuffling gait and does not pick his feet up well I find no evidence of muscle rigidity.  No cogwheeling.  Patient not drooling.  Denies problems swallowing or speaking  Patient does have  difficulty walking he is at risk of falling.     Assessment & Plan:  I doubt Parkinson's but certainly this needs to be monitored  Significant weakness along with ataxia and balance issues He would benefit from physical therapy In addition this he would benefit from using a walking staff or cane at all times  Blood pressure no appreciable change with orthostatic checks therefore leave medicine as is  No sign of stroke going on  [place] his wife would drive him I think that would be fine he just needs to be careful going up and down steps  25 minutes was spent with the patient.  This statement verifies that 25 minutes was indeed spent with the patient.  More than 50% of this visit-total duration of the visit-was spent in counseling and coordination of care. The issues that the patient came in for today as reflected in the diagnosis (s) please refer to documentation for further details.

## 2019-01-24 ENCOUNTER — Encounter: Payer: Self-pay | Admitting: Family Medicine

## 2019-01-30 ENCOUNTER — Ambulatory Visit (HOSPITAL_COMMUNITY): Payer: Medicare Other

## 2019-02-06 ENCOUNTER — Ambulatory Visit (HOSPITAL_COMMUNITY): Payer: Medicare Other

## 2019-02-13 ENCOUNTER — Encounter (HOSPITAL_COMMUNITY): Payer: Self-pay

## 2019-02-13 ENCOUNTER — Other Ambulatory Visit: Payer: Self-pay

## 2019-02-13 ENCOUNTER — Ambulatory Visit (HOSPITAL_COMMUNITY): Payer: Medicare Other | Attending: Family Medicine

## 2019-02-13 DIAGNOSIS — Z9181 History of falling: Secondary | ICD-10-CM

## 2019-02-13 DIAGNOSIS — R2681 Unsteadiness on feet: Secondary | ICD-10-CM | POA: Insufficient documentation

## 2019-02-13 DIAGNOSIS — M6281 Muscle weakness (generalized): Secondary | ICD-10-CM | POA: Diagnosis not present

## 2019-02-13 NOTE — Therapy (Signed)
[facility] [place], [place], [address] Phone: [phone]   Fax:  [phone]  Physical Therapy Evaluation  Patient Details  Name: Luke Solis MRN: [id number] Date of Birth: December 02, 1931 Referring Provider (PT): Kathyrn Drown, MD   Encounter Date: 02/13/2019  PT End of Session - 02/13/19 1420    Visit Number  1    Number of Visits  8    Date for PT Re-Evaluation  03/13/19    Authorization Type  Medicare (Secondary: [employer])    Authorization Time Period  02/13/19 to 03/13/19    Authorization - Visit Number  1    Authorization - Number of Visits  10    PT Start Time  1030    PT Stop Time  1108    PT Time Calculation (min)  38 min    Equipment Utilized During Treatment  Gait belt    Activity Tolerance  Patient tolerated treatment well    Behavior During Therapy  WFL for tasks assessed/performed       Past Medical History:  Diagnosis Date  . Coronary atherosclerosis of native coronary artery    BMS proximal and mid RCA as well as BMS circumflex - 2002 (had residual 70% distal LAD), LVEF 60%  . Diabetes mellitus without complication ([place])   . Essential hypertension, benign   . Hyperlipidemia   . Neuropathy   . NSTEMI (non-ST elevated myocardial infarction) ([place])    2002  . Prostate cancer ([place])   . Sleep apnea    On CPAP  . Stroke [facility])     Past Surgical History:  Procedure Laterality Date  . COLONOSCOPY    . INGUINAL HERNIA REPAIR    . KNEE SURGERY    . NOSE SURGERY    . PROSTATE SURGERY    . TONSILLECTOMY      There were no vitals filed for this visit.   Subjective Assessment - 02/13/19 1034    Subjective  Pt states that he has been having frequent falls. He has had 3 falls in the last 6 months, which includes 1 this morning when he rolled out of bed. He hit his ear on his nightstand but denies any s/s of head injury. His second fall was when he was on his son's porch and he fell backwards into a bush. The  other fall occurred when he bent over to do something to his foot and he fell forward onto his forehead. He denies any real BP issues. He does have h/o CVA 1YA but states he doesn't have any knowledge of residual weakness. He reports he uses a walking stick when he is walking which helps with his steadiness. He states that he feels unsteady when he is standing still. If he turns his fast he gets off balance; this has been going on since the stroke. He denies h/o vertigo.     Pertinent History  CVA 1YA    Limitations  Walking;Standing    How long can you sit comfortably?  no issues    How long can you stand comfortably?  "a long time"    How long can you walk comfortably?  "I kinda shuffle along"    Patient Stated Goals  not to fall    Currently in Pain?  No/denies         [facility] PT Assessment - 02/13/19 0001      Assessment   Medical Diagnosis  Frequent falls    Referring Provider (  PT)  Kathyrn Drown, MD    Onset Date/Surgical Date  --   about 6 months   Next MD Visit  in 2 weeks    Prior Therapy  HHPT following CVA 1 year ago, none for current issue      Precautions   Precautions  Fall      Balance Screen   Has the patient fallen in the past 6 months  Yes    How many times?  3    Has the patient had a decrease in activity level because of a fear of falling?   No    Is the patient reluctant to leave their home because of a fear of falling?   No      Prior Function   Level of Independence  Independent    Vocation  Retired    Leisure  have lunch with my grandchildren      Observation/Other Assessments   Focus on Therapeutic Outcomes (FOTO)   n/a      Coordination   Heel Shin Test  WNL BLE    9 Hole Peg Test  RAMPs WNL BLE      Functional Tests   Functional tests  Sit to Stand      Sit to Stand   Comments  30sec chair rise test: 13x, no UE      Posture/Postural Control   Posture/Postural Control  Postural limitations    Postural Limitations  Rounded Shoulders;Forward  head;Increased thoracic kyphosis      Tone   Assessment Location  Right Lower Extremity;Left Lower Extremity      ROM / Strength   AROM / PROM / Strength  Strength      Strength   Strength Assessment Site  Hip;Knee;Ankle    Right Hip Flexion  5/5    Right Hip Extension  3-/5    Right Hip ABduction  4/5    Left Hip Flexion  4+/5    Left Hip Extension  3-/5    Left Hip ABduction  4/5    Right Knee Flexion  4/5    Right Knee Extension  5/5    Left Knee Flexion  4+/5    Left Knee Extension  5/5    Right Ankle Dorsiflexion  4-/5    Left Ankle Dorsiflexion  4-/5      Ambulation/Gait   Gait Comments  no L arm swing during gait noted, intermittent L foot shuffling/poor clearance      Balance   Balance Assessed  Yes      Static Standing Balance   Static Standing - Balance Support  No upper extremity supported    Static Standing Balance -  Activities   Single Leg Stance - Right Leg;Single Leg Stance - Left Leg    Static Standing - Comment/# of Minutes  3sec or < BLE      Standardized Balance Assessment   Standardized Balance Assessment  Dynamic Gait Index      Dynamic Gait Index   Level Surface  Mild Impairment    Change in Gait Speed  Mild Impairment    Gait with Horizontal Head Turns  Mild Impairment    Gait with Vertical Head Turns  Mild Impairment    Gait and Pivot Turn  Mild Impairment    Step Over Obstacle  Mild Impairment    Step Around Obstacles  Mild Impairment    Steps  Mild Impairment    Total Score  16  RLE Tone   RLE Tone  Within Functional Limits      LLE Tone   LLE Tone  Within Functional Limits          Objective measurements completed on examination: See above findings.        PT Education - 02/13/19 1420    Education Details  exam findings, HEP, POC    Person(s) Educated  Patient    Methods  Explanation;Handout    Comprehension  Verbalized understanding       PT Short Term Goals - 02/13/19 1454      PT SHORT TERM GOAL #1   Title   Pt will have improved bil SLS to 6 sec without UE to demo improved functional strength and reaction strategies of bil hips and ankles to maximize his gait and balance.     Time  2    Period  Weeks    Status  New    Target Date  02/27/19      PT SHORT TERM GOAL #2   Title  Pt will have improved MMT by 1/2 grade throughout all deficient mm groups in order to maximize balance and reduce risk for falls.     Time  2    Period  Weeks    Status  New      PT SHORT TERM GOAL #3   Title  Pt will have improved DGI to 20/24 to demo improved dynamic balance and reduce his risk for falls during ambulation.    Time  2    Period  Weeks    Status  New        PT Long Term Goals - 02/13/19 1455      PT LONG TERM GOAL #1   Title  Pt will have improved bil SLS to 12 sec or > without UE to further demo improved ankle and hip strength and reactive strategies to reduce his risk for falls.    Time  4    Period  Weeks    Status  New    Target Date  03/13/19      PT LONG TERM GOAL #2   Title  Pt will have improved MMT by 1 grade throughout all deficient mm groups in order to further maximize balance and reduce risk for falls.     Time  4    Period  Weeks    Status  New      PT LONG TERM GOAL #3   Title  Pt will report and demo reduced LLE shuffling during gait to 50% or better to demo improved functional strength and reduce risk for falls.     Time  4    Period  Weeks    Status  New             Plan - 02/13/19 1442    Clinical Impression Statement  Pt is pleasant 85YO M who presents to OPPT with reports of multiple falls over the last 6 months. He has fallen posterior, fwd while bending, and then OOB the morning of this eval (rolled out of bed). Pt currently presents with deficits in BLE MMT, SLS, dynamic balance, gait, and functional strength as well as deficits in posture. Pt noted to have intermittent L foot shuffling and no L arm swing during gait. All tone and rigidity WNL. Pt negative  for cogwheel or increased tone in either LE. Pt does have h/o CVA 1YA, and per chart review, pt had acute infarct  R putamen and internal capsule fibers March 2019, which is consistent with his apparent mild residual L hemiparesis noted during evaluation today. Pt scored 16/24 on the DGI indicating he is as risk for falls when ambulating and he was only able to perform bil SLS for 3 sec or <. Pt needs skilled PT intervention to address these impairments noted in order to maximize his strength and reduce his risk for falls.     Personal Factors and Comorbidities  Age    Examination-Activity Limitations  Stand;Squat    Examination-Participation Restrictions  Yard Work;Community Activity    Stability/Clinical Decision Making  Stable/Uncomplicated    Clinical Decision Making  Low    Rehab Potential  Good    PT Frequency  2x / week    PT Duration  4 weeks    PT Treatment/Interventions  ADLs/Self Care Home Management;Aquatic Therapy;Cryotherapy;Electrical Stimulation;Moist Heat;Ultrasound;DME Instruction;Gait training;Stair training;Functional mobility training;Therapeutic activities;Therapeutic exercise;Balance training;Neuromuscular re-education;Patient/family education;Orthotic Fit/Training;Manual techniques;Passive range of motion;Dry needling;Taping    PT Next Visit Plan  review goals, begin functional strengthening, balance training, gait training (L arm swing)    PT Home Exercise Plan  eval: bridging, SLR, standing heel and toe    Consulted and Agree with Plan of Care  Patient       Patient will benefit from skilled therapeutic intervention in order to improve the following deficits and impairments:  Abnormal gait, Decreased activity tolerance, Decreased balance, Decreased endurance, Decreased mobility, Difficulty walking, Decreased strength, Hypomobility, Increased fascial restricitons, Impaired flexibility, Increased muscle spasms, Improper body mechanics, Postural dysfunction  Visit  Diagnosis: Unsteadiness on feet - Plan: PT plan of care cert/re-cert  History of falling - Plan: PT plan of care cert/re-cert  Muscle weakness (generalized) - Plan: PT plan of care cert/re-cert     Problem List Patient Active Problem List   Diagnosis Date Noted  . Obstructive sleep apnea treated with continuous positive airway pressure (CPAP) 09/05/2018  . Altered mental status 02/28/2018  . Anemia 02/28/2018  . Fatigue 08/03/2017  . Prostate cancer ([place]) 10/22/2015  . Forgetfulness 04/29/2015  . Type 2 diabetes mellitus with diabetic neuropathy ([place]) 07/17/2013  . Hyperlipidemia 01/19/2010  . OSA (obstructive sleep apnea) 01/19/2010  . Essential hypertension, benign 01/19/2010  . Coronary atherosclerosis of native coronary artery 01/19/2010        Geraldine Solar PT, DPT  [facility] [address] [place], [place], [address] Phone: [phone]   Fax:  [phone]  Name: Luke Solis MRN: [id number] Date of Birth: 08-26-1931

## 2019-02-15 ENCOUNTER — Ambulatory Visit (HOSPITAL_COMMUNITY): Payer: Medicare Other

## 2019-02-15 ENCOUNTER — Encounter (HOSPITAL_COMMUNITY): Payer: Self-pay

## 2019-02-15 DIAGNOSIS — R2681 Unsteadiness on feet: Secondary | ICD-10-CM | POA: Diagnosis not present

## 2019-02-15 DIAGNOSIS — Z9181 History of falling: Secondary | ICD-10-CM | POA: Diagnosis not present

## 2019-02-15 DIAGNOSIS — M6281 Muscle weakness (generalized): Secondary | ICD-10-CM | POA: Diagnosis not present

## 2019-02-15 NOTE — Therapy (Signed)
[place], [place], [address] Phone: [phone]   Fax:  [phone]  Physical Therapy Treatment  Patient Details  Name: CLARICE BONAVENTURE MRN: [id number] Date of Birth: 1931-10-14 Referring Provider (PT): Kathyrn Drown, MD   Encounter Date: 02/15/2019  PT End of Session - 02/15/19 1011    Visit Number  2    Number of Visits  8    Date for PT Re-Evaluation  03/13/19    Authorization Type  Medicare (Secondary: [employer])    Authorization Time Period  02/13/19 to 03/13/19    Authorization - Visit Number  2    Authorization - Number of Visits  10    PT Start Time  [id number]    PT Stop Time  1025    PT Time Calculation (min)  42 min    Equipment Utilized During Treatment  Gait belt    Activity Tolerance  Patient tolerated treatment well    Behavior During Therapy  [facility] for tasks assessed/performed       Past Medical History:  Diagnosis Date  . Coronary atherosclerosis of native coronary artery    BMS proximal and mid RCA as well as BMS circumflex - 2002 (had residual 70% distal LAD), LVEF 60%  . Diabetes mellitus without complication ([place])   . Essential hypertension, benign   . Hyperlipidemia   . Neuropathy   . NSTEMI (non-ST elevated myocardial infarction) ([place])    2002  . Prostate cancer ([place])   . Sleep apnea    On CPAP  . Stroke [facility])     Past Surgical History:  Procedure Laterality Date  . COLONOSCOPY    . INGUINAL HERNIA REPAIR    . KNEE SURGERY    . NOSE SURGERY    . PROSTATE SURGERY    . TONSILLECTOMY      There were no vitals filed for this visit.  Subjective Assessment - 02/15/19 1011    Subjective  Pt states that he is doing well this morning. No pain and no questions about his HEP.     Pertinent History  CVA 1YA    Limitations  Walking;Standing    How long can you sit comfortably?  no issues    How long can you stand comfortably?  "a long time"    How long can you walk comfortably?  "I kinda  shuffle along"    Patient Stated Goals  not to fall    Currently in Pain?  No/denies              [facility] Adult PT Treatment/Exercise - 02/15/19 0001      Exercises   Exercises  Knee/Hip      Knee/Hip Exercises: Standing   Heel Raises  Both;15 reps    Heel Raises Limitations  heel and toe    Hip Abduction  Both;2 sets;10 reps    Abduction Limitations  RTB    Hip Extension  Both;2 sets;10 reps    Extension Limitations  RTB    Forward Step Up  Both;10 reps;Step Height: 6";Hand Hold: 2    Functional Squat  2 sets;10 reps    Functional Squat Limitations  min cues for form, BUE on // bars for support      Balance Exercises - 02/15/19 1016      Balance Exercises: Standing   Tandem Stance  Eyes open;Intermittent upper extremity support;3 reps;10 secs   then x10 reps with head turns R/L, up/down BLE  SLS  Eyes open;Solid surface;Upper extremity support 1;10 secs   x10 reps BLE    Rockerboard  Anterior/posterior;Lateral;Intermittent UE support   x2 mins each way            PT Education - 02/15/19 1011    Education Details  reviewed goals, exercise technique, continue HEP    Person(s) Educated  Patient    Methods  Explanation;Demonstration;Verbal cues    Comprehension  Verbalized understanding;Returned demonstration       PT Short Term Goals - 02/13/19 1454      PT SHORT TERM GOAL #1   Title  Pt will have improved bil SLS to 6 sec without UE to demo improved functional strength and reaction strategies of bil hips and ankles to maximize his gait and balance.     Time  2    Period  Weeks    Status  New    Target Date  02/27/19      PT SHORT TERM GOAL #2   Title  Pt will have improved MMT by 1/2 grade throughout all deficient mm groups in order to maximize balance and reduce risk for falls.     Time  2    Period  Weeks    Status  New      PT SHORT TERM GOAL #3   Title  Pt will have improved DGI to 20/24 to demo improved dynamic balance and reduce his risk for  falls during ambulation.    Time  2    Period  Weeks    Status  New        PT Long Term Goals - 02/13/19 1455      PT LONG TERM GOAL #1   Title  Pt will have improved bil SLS to 12 sec or > without UE to further demo improved ankle and hip strength and reactive strategies to reduce his risk for falls.    Time  4    Period  Weeks    Status  New    Target Date  03/13/19      PT LONG TERM GOAL #2   Title  Pt will have improved MMT by 1 grade throughout all deficient mm groups in order to further maximize balance and reduce risk for falls.     Time  4    Period  Weeks    Status  New      PT LONG TERM GOAL #3   Title  Pt will report and demo reduced LLE shuffling during gait to 50% or better to demo improved functional strength and reduce risk for falls.     Time  4    Period  Weeks    Status  New            Plan - 02/15/19 1026    Clinical Impression Statement  Reviewed goals at beginning of session with no f/u questions. Began POC this date with functional strengthening and balance training. Min cues for form throughout all therex. No reports of pain during therex. Pt generally challenged with balance activities and had the most difficulty with head turns during tandem stance on firm ground. No pain at EOS, just fatigue. Continue as planned, progressing as able.     Personal Factors and Comorbidities  Age    Examination-Activity Limitations  Stand;Squat    Examination-Participation Restrictions  Yard Work;Community Activity    Stability/Clinical Decision Making  Stable/Uncomplicated    Rehab Potential  Good    PT Frequency  2x / week    PT Duration  4 weeks    PT Treatment/Interventions  ADLs/Self Care Home Management;Aquatic Therapy;Cryotherapy;Electrical Stimulation;Moist Heat;Ultrasound;DME Instruction;Gait training;Stair training;Functional mobility training;Therapeutic activities;Therapeutic exercise;Balance training;Neuromuscular re-education;Patient/family  education;Orthotic Fit/Training;Manual techniques;Passive range of motion;Dry needling;Taping    PT Next Visit Plan  cotninue functional strengthening, balance training, gait training (L arm swing)    PT Home Exercise Plan  eval: bridging, SLR, standing heel and toe    Consulted and Agree with Plan of Care  Patient       Patient will benefit from skilled therapeutic intervention in order to improve the following deficits and impairments:  Abnormal gait, Decreased activity tolerance, Decreased balance, Decreased endurance, Decreased mobility, Difficulty walking, Decreased strength, Hypomobility, Increased fascial restricitons, Impaired flexibility, Increased muscle spasms, Improper body mechanics, Postural dysfunction  Visit Diagnosis: Unsteadiness on feet  History of falling  Muscle weakness (generalized)     Problem List Patient Active Problem List   Diagnosis Date Noted  . Obstructive sleep apnea treated with continuous positive airway pressure (CPAP) 09/05/2018  . Altered mental status 02/28/2018  . Anemia 02/28/2018  . Fatigue 08/03/2017  . Prostate cancer ([place]) 10/22/2015  . Forgetfulness 04/29/2015  . Type 2 diabetes mellitus with diabetic neuropathy ([place]) 07/17/2013  . Hyperlipidemia 01/19/2010  . OSA (obstructive sleep apnea) 01/19/2010  . Essential hypertension, benign 01/19/2010  . Coronary atherosclerosis of native coronary artery 01/19/2010        Geraldine Solar PT, DPT   [facility] [address] [place], [place], [address] Phone: [phone]   Fax:  [phone]  Name: ZEBULON GANTT MRN: [id number] Date of Birth: Jul 14, 1931

## 2019-02-20 ENCOUNTER — Other Ambulatory Visit: Payer: Self-pay

## 2019-02-20 ENCOUNTER — Encounter (HOSPITAL_COMMUNITY): Payer: Self-pay

## 2019-02-20 ENCOUNTER — Ambulatory Visit (HOSPITAL_COMMUNITY): Payer: Medicare Other

## 2019-02-20 DIAGNOSIS — M6281 Muscle weakness (generalized): Secondary | ICD-10-CM

## 2019-02-20 DIAGNOSIS — R2681 Unsteadiness on feet: Secondary | ICD-10-CM | POA: Diagnosis not present

## 2019-02-20 DIAGNOSIS — Z9181 History of falling: Secondary | ICD-10-CM | POA: Diagnosis not present

## 2019-02-20 NOTE — Therapy (Signed)
[employer] [place], [place], [address] Phone: [phone]   Fax:  [phone]  Physical Therapy Treatment  Patient Details  Name: Luke Solis MRN: [id number] Date of Birth: [birth year] Referring Provider (PT): Kathyrn Drown, MD   Encounter Date: 02/20/2019  PT End of Session - 02/20/19 0952    Visit Number  3    Number of Visits  8    Date for PT Re-Evaluation  03/13/19    Authorization Type  Medicare (Secondary: [employer])    Authorization Time Period  02/13/19 to 03/13/19    Authorization - Visit Number  3    Authorization - Number of Visits  10    PT Start Time  [phone]   pt arrived few mins late   PT Stop Time  1031    PT Time Calculation (min)  39 min    Equipment Utilized During Treatment  Gait belt    Activity Tolerance  Patient tolerated treatment well    Behavior During Therapy  [facility] for tasks assessed/performed       Past Medical History:  Diagnosis Date  . Coronary atherosclerosis of native coronary artery    BMS proximal and mid RCA as well as BMS circumflex - 2002 (had residual 70% distal LAD), LVEF 60%  . Diabetes mellitus without complication ([place])   . Essential hypertension, benign   . Hyperlipidemia   . Neuropathy   . NSTEMI (non-ST elevated myocardial infarction) ([place])    2002  . Prostate cancer ([place])   . Sleep apnea    On CPAP  . Stroke [facility])     Past Surgical History:  Procedure Laterality Date  . COLONOSCOPY    . INGUINAL HERNIA REPAIR    . KNEE SURGERY    . NOSE SURGERY    . PROSTATE SURGERY    . TONSILLECTOMY      There were no vitals filed for this visit.  Subjective Assessment - 02/20/19 0953    Subjective  Pt states that he feels good.     Pertinent History  CVA 1YA    Limitations  Walking;Standing    How long can you sit comfortably?  no issues    How long can you stand comfortably?  "a long time"    How long can you walk comfortably?  "I kinda shuffle along"    Patient Stated  Goals  not to fall    Currently in Pain?  No/denies           [facility] Adult PT Treatment/Exercise - 02/20/19 0001      Knee/Hip Exercises: Standing   Heel Raises  Both;20 reps    Heel Raises Limitations  heel and toe    Hip Abduction  Both;2 sets;10 reps    Abduction Limitations  RTB    Hip Extension  Both;2 sets;10 reps    Extension Limitations  RTB    Functional Squat  2 sets;10 reps    Functional Squat Limitations  min cues for form, BUE on // bars for support    Gait Training  x2 laps focusing on arm swing (x1 lap with bil PVC pipe and x1 lap with verbal cuing)    Other Standing Knee Exercises  sidestep 19ft x 2RT RTB      Balance Exercises - 02/20/19 1013      Balance Exercises: Standing   Tandem Stance  Eyes open;Intermittent upper extremity support   x10 reps with head turns R/L, up/down  BLE   Tandem Gait  Forward;Intermittent upper extremity support;1 rep   in // bars   Sit to Stand Time  bil tandem stance firm EC 2x15sec    Other Standing Exercises  cone taps firm x5RT BLE           PT Education - 02/20/19 0952    Education Details  exercise technique, conitnue HEP    Person(s) Educated  Patient    Methods  Explanation;Demonstration    Comprehension  Verbalized understanding;Returned demonstration       PT Short Term Goals - 02/13/19 1454      PT SHORT TERM GOAL #1   Title  Pt will have improved bil SLS to 6 sec without UE to demo improved functional strength and reaction strategies of bil hips and ankles to maximize his gait and balance.     Time  2    Period  Weeks    Status  New    Target Date  02/27/19      PT SHORT TERM GOAL #2   Title  Pt will have improved MMT by 1/2 grade throughout all deficient mm groups in order to maximize balance and reduce risk for falls.     Time  2    Period  Weeks    Status  New      PT SHORT TERM GOAL #3   Title  Pt will have improved DGI to 20/24 to demo improved dynamic balance and reduce his risk for falls  during ambulation.    Time  2    Period  Weeks    Status  New        PT Long Term Goals - 02/13/19 1455      PT LONG TERM GOAL #1   Title  Pt will have improved bil SLS to 12 sec or > without UE to further demo improved ankle and hip strength and reactive strategies to reduce his risk for falls.    Time  4    Period  Weeks    Status  New    Target Date  03/13/19      PT LONG TERM GOAL #2   Title  Pt will have improved MMT by 1 grade throughout all deficient mm groups in order to further maximize balance and reduce risk for falls.     Time  4    Period  Weeks    Status  New      PT LONG TERM GOAL #3   Title  Pt will report and demo reduced LLE shuffling during gait to 50% or better to demo improved functional strength and reduce risk for falls.     Time  4    Period  Weeks    Status  New            Plan - 02/20/19 1031    Clinical Impression Statement  Continued with established POC focusing on improving overall functional strength and balance. Added hip abd and ext with RTB to HEP for him to continue to address gluteal weakness. Continued cues required for improved posture throughout session. Added sidestepping for glute strength and rowing for postural strength this date. He continues to be generally unsteady during balance activities, especially tandem stance with EC. Overall, pt tolerated session well. Continue as planned, progressing as able.     Personal Factors and Comorbidities  Age    Examination-Activity Limitations  Stand;Squat    Examination-Participation Restrictions  Yard Work;Community Activity  Stability/Clinical Decision Making  Stable/Uncomplicated    Rehab Potential  Good    PT Frequency  2x / week    PT Duration  4 weeks    PT Treatment/Interventions  ADLs/Self Care Home Management;Aquatic Therapy;Cryotherapy;Electrical Stimulation;Moist Heat;Ultrasound;DME Instruction;Gait training;Stair training;Functional mobility training;Therapeutic  activities;Therapeutic exercise;Balance training;Neuromuscular re-education;Patient/family education;Orthotic Fit/Training;Manual techniques;Passive range of motion;Dry needling;Taping    PT Next Visit Plan  cotninue functional strengthening, balance training, gait training for arm swing    PT Home Exercise Plan  eval: bridging, SLR, standing heel and toe; 3/17: hip abd and ext with RTB, walking with water bottles    Consulted and Agree with Plan of Care  Patient       Patient will benefit from skilled therapeutic intervention in order to improve the following deficits and impairments:  Abnormal gait, Decreased activity tolerance, Decreased balance, Decreased endurance, Decreased mobility, Difficulty walking, Decreased strength, Hypomobility, Increased fascial restricitons, Impaired flexibility, Increased muscle spasms, Improper body mechanics, Postural dysfunction  Visit Diagnosis: Unsteadiness on feet  History of falling  Muscle weakness (generalized)     Problem List Patient Active Problem List   Diagnosis Date Noted  . Obstructive sleep apnea treated with continuous positive airway pressure (CPAP) 09/05/2018  . Altered mental status 02/28/2018  . Anemia 02/28/2018  . Fatigue 08/03/2017  . Pro[place] cancer ([place]) 10/22/2015  . Forgetfulness 04/29/2015  . Type 2 diabetes mellitus with diabetic neuropathy ([place]) 07/17/2013  . Hyperlipidemia 01/19/2010  . OSA (obstructive sleep apnea) 01/19/2010  . Essential hypertension, benign 01/19/2010  . Coronary atherosclerosis of native coronary artery 01/19/2010       Geraldine Solar PT, DPT  [facility] [address] [place], [place], [address] Phone: [phone]   Fax:  [phone]  Name: MONTRAY KLIEBERT MRN: [id number] Date of Birth: [birth year]

## 2019-02-23 ENCOUNTER — Ambulatory Visit (HOSPITAL_COMMUNITY): Payer: Medicare Other

## 2019-02-26 ENCOUNTER — Telehealth: Payer: Self-pay | Admitting: Family Medicine

## 2019-02-26 NOTE — Telephone Encounter (Signed)
Please touch base with this family.  If things are reasonably stable and not having any issues I would recommend that he not do a follow-up office visit this week and we can geared toward doing this in approximately 6 weeks when some of all of this settles down-patient does have a scheduled follow-up office visit this week  Obviously if he is having significant issues we may need to see him or potentially I may be able to help via a phone message

## 2019-02-26 NOTE — Telephone Encounter (Signed)
I tried to call the patient no answer.

## 2019-02-27 ENCOUNTER — Ambulatory Visit: Payer: Medicare Other | Admitting: Family Medicine

## 2019-02-27 ENCOUNTER — Ambulatory Visit (HOSPITAL_COMMUNITY): Payer: Medicare Other

## 2019-02-28 ENCOUNTER — Other Ambulatory Visit: Payer: Self-pay | Admitting: Family Medicine

## 2019-02-28 ENCOUNTER — Ambulatory Visit (INDEPENDENT_AMBULATORY_CARE_PROVIDER_SITE_OTHER): Payer: Medicare Other | Admitting: Family Medicine

## 2019-02-28 DIAGNOSIS — E7849 Other hyperlipidemia: Secondary | ICD-10-CM

## 2019-02-28 DIAGNOSIS — I10 Essential (primary) hypertension: Secondary | ICD-10-CM | POA: Diagnosis not present

## 2019-02-28 NOTE — Progress Notes (Signed)
Phone visit was completed per patient request Video capabilities was not possible This was a follow-up on diabetes hyperlipidemia and hypertension Also a follow-up on weakness and ataxia The patient was spoken to as well as his wife was on the line 17 minutes was spent on the phone with the patient 15 minutes was spent with patient today discussing healthcare issues which they came.  More than 50% of this visit-total duration of visit-was spent in counseling and coordination of care.  Please see diagnosis regarding the focus of this coordination and care  To the best of the patient's knowledge he denies any low sugar spells states his highest sugar is been 170.  He is trying to eat healthy.  He is doing some walking within the house Both he and his wife are fearful of issues At least 10 minutes was spent discussing ways to minimize their risk We also went over all of his medicines He relates compliance with all of his medicines He denies any problems with his medicines get some from the local pharmacy Patient denies any breathing difficulties denies any chest pain headaches denies rectal bleeding hematuria To the best of his knowledge his vital signs are doing okay He did have a couple medications that he needed refills on The patient is due for some blood work to look at his kidney functions but we will delay this until May At that time we will do J5U, lipid, metabolic 7 We will have the nurses mail the patient paperwork to do the lab work in mid May-he may do it sooner if coronavirus issues subside  Next follow-up will be in approximately 4 months

## 2019-02-28 NOTE — Telephone Encounter (Signed)
Patient would like to do telephone visit 02/28/19   [phone]

## 2019-03-01 ENCOUNTER — Ambulatory Visit (HOSPITAL_COMMUNITY): Payer: Medicare Other

## 2019-03-01 NOTE — Telephone Encounter (Signed)
Phone visit was completed

## 2019-03-04 MED ORDER — METFORMIN HCL 500 MG PO TABS: 500.0000 mg | ORAL_TABLET | Freq: Two times a day (BID) | ORAL | 1 refills | Status: DC

## 2019-03-04 MED ORDER — PREGABALIN 100 MG PO CAPS: 100.0000 mg | ORAL_CAPSULE | Freq: Two times a day (BID) | ORAL | 5 refills | Status: DC

## 2019-03-06 ENCOUNTER — Ambulatory Visit (HOSPITAL_COMMUNITY): Payer: Medicare Other

## 2019-03-07 ENCOUNTER — Telehealth (HOSPITAL_COMMUNITY): Payer: Self-pay

## 2019-03-07 NOTE — Telephone Encounter (Signed)
Attempted to call pt but no answer and no voicemail/answering machine set up. Will attempt to call back at a later time/day to inform him of our clinic cancelling all appointments until further notice due to COVID-19 and gauge his interest in either telehealth sessions, weekly phone calls, and/or updated HEP.   Geraldine Solar PT, DPT

## 2019-03-08 ENCOUNTER — Ambulatory Visit (HOSPITAL_COMMUNITY): Payer: Medicare Other

## 2019-03-09 ENCOUNTER — Telehealth (HOSPITAL_COMMUNITY): Payer: Self-pay | Admitting: Physical Therapy

## 2019-03-09 NOTE — Telephone Encounter (Signed)
Esli Jernigan was contacted today regarding temporary reduction of Outpatient Rehabilitation Services at [facility] due to concerns for community transmission of COVID-19.  Patient identity was verified.  Assessed if patient needed to be seen in person by clinician (recent fall or acute injury that requires hands on assessment and advice, change in diet order, post-surgical, special cases, etc.).    Patient did not have an acute/special need that requires in person visit. Proceeded with phone call.  Therapist advised the patient to continue to perform his/her HEP and assured he/she had no unanswered questions or concerns at this time.  The patient was offered and declined the continuation of their plan of care by using methods such as an E-Visit, virtual check in, or Telehealth visit.  Outpatient Rehabilitation Services at [facility] will follow up with this client when we are able to safely resume care at the clinic to all populations.  Patient is aware we can be reached by telephone during limited business hours in the meantime.   Clarene Critchley PT, DPT 11:10 AM, 03/09/19 [phone]

## 2019-03-12 ENCOUNTER — Ambulatory Visit (HOSPITAL_COMMUNITY): Payer: Medicare Other

## 2019-04-11 DIAGNOSIS — M79672 Pain in left foot: Secondary | ICD-10-CM | POA: Diagnosis not present

## 2019-04-11 DIAGNOSIS — L609 Nail disorder, unspecified: Secondary | ICD-10-CM | POA: Diagnosis not present

## 2019-04-11 DIAGNOSIS — M79671 Pain in right foot: Secondary | ICD-10-CM | POA: Diagnosis not present

## 2019-04-11 DIAGNOSIS — E1140 Type 2 diabetes mellitus with diabetic neuropathy, unspecified: Secondary | ICD-10-CM | POA: Diagnosis not present

## 2019-04-17 ENCOUNTER — Telehealth (HOSPITAL_COMMUNITY): Payer: Self-pay | Admitting: Physical Therapy

## 2019-04-17 NOTE — Telephone Encounter (Signed)
was contacted today by phone regarding resuming therapy services following our temporary reduction of Services secondary to COVID-19.  Patient identity was verified  Pt reported he would like to resume services at this time.   Appointment made for 04/25/19  Teena Irani, PTA/CLT [phone]

## 2019-04-18 DIAGNOSIS — H40013 Open angle with borderline findings, low risk, bilateral: Secondary | ICD-10-CM | POA: Diagnosis not present

## 2019-04-18 DIAGNOSIS — H02831 Dermatochalasis of right upper eyelid: Secondary | ICD-10-CM | POA: Diagnosis not present

## 2019-04-18 DIAGNOSIS — H43813 Vitreous degeneration, bilateral: Secondary | ICD-10-CM | POA: Diagnosis not present

## 2019-04-18 DIAGNOSIS — E119 Type 2 diabetes mellitus without complications: Secondary | ICD-10-CM | POA: Diagnosis not present

## 2019-04-18 DIAGNOSIS — H26492 Other secondary cataract, left eye: Secondary | ICD-10-CM | POA: Diagnosis not present

## 2019-04-18 DIAGNOSIS — H02834 Dermatochalasis of left upper eyelid: Secondary | ICD-10-CM | POA: Diagnosis not present

## 2019-04-18 DIAGNOSIS — Z961 Presence of intraocular lens: Secondary | ICD-10-CM | POA: Diagnosis not present

## 2019-04-18 LAB — HM DIABETES EYE EXAM

## 2019-04-25 ENCOUNTER — Other Ambulatory Visit: Payer: Self-pay

## 2019-04-25 ENCOUNTER — Ambulatory Visit (HOSPITAL_COMMUNITY): Payer: Medicare Other | Attending: Family Medicine

## 2019-04-25 ENCOUNTER — Encounter (HOSPITAL_COMMUNITY): Payer: Self-pay

## 2019-04-25 DIAGNOSIS — M6281 Muscle weakness (generalized): Secondary | ICD-10-CM | POA: Diagnosis not present

## 2019-04-25 DIAGNOSIS — R2681 Unsteadiness on feet: Secondary | ICD-10-CM | POA: Insufficient documentation

## 2019-04-25 DIAGNOSIS — Z9181 History of falling: Secondary | ICD-10-CM

## 2019-04-25 NOTE — Therapy (Signed)
[facility] [place], [place], [address] Phone: [phone]   Fax:  [phone]  Physical Therapy Re-Evaluation  Patient Details  Name: Luke Solis MRN: [id number] Date of Birth: 13-May-1931  Referring Provider (PT): Kathyrn Drown, MD   Encounter Date: 04/25/2019   Progress Note Reporting Period 02/13/19 to 04/25/19  See note below for Objective Data and Assessment of Progress/Goals.     PT End of Session - 04/25/19 0953    Visit Number  1    Number of Visits  8    Date for PT Re-Evaluation  05/23/19    Authorization Type  Medicare (Secondary: AARP)    Authorization Time Period  02/13/19 to 03/13/19; 04/25/19-05/23/19   Authorization - Visit Number  1    Authorization - Number of Visits  10    PT Start Time  0950    PT Stop Time  1024    PT Time Calculation (min)  34 min    Equipment Utilized During Treatment  Gait belt    Activity Tolerance  Patient tolerated treatment well    Behavior During Therapy  WFL for tasks assessed/performed       Past Medical History:  Diagnosis Date  . Coronary atherosclerosis of native coronary artery    BMS proximal and mid RCA as well as BMS circumflex - 2002 (had residual 70% distal LAD), LVEF 60%  . Diabetes mellitus without complication ([place])   . Essential hypertension, benign   . Hyperlipidemia   . Neuropathy   . NSTEMI (non-ST elevated myocardial infarction) ([place])    2002  . Prostate cancer ([place])   . Sleep apnea    On CPAP  . Stroke [facility])     Past Surgical History:  Procedure Laterality Date  . COLONOSCOPY    . INGUINAL HERNIA REPAIR    . KNEE SURGERY    . NOSE SURGERY    . PROSTATE SURGERY    . TONSILLECTOMY      There were no vitals filed for this visit.  Subjective Assessment - 04/25/19 0952    Subjective Patient reports he has been doing well since last be seen for physical therapy. He denies any falls since last visit and states he is currently working on  remodeling a house in [place] and will head there after this session. He reports he has been walking about 1 mile every day for exercise and uses a walking stick. He did not bring the walking stick in but states it is in his truck.   Pertinent History  CVA 1YA    Limitations  Walking;Standing    How long can you sit comfortably?  no issues    How long can you stand comfortably?  "a long time"    How long can you walk comfortably?  "I kinda shuffle along"    Patient Stated Goals  not to fall    Currently in Pain?  No/denies        [facility] PT Assessment - 04/25/19 0001      Assessment   Medical Diagnosis  Frequent falls    Referring Provider (PT)  Kathyrn Drown, MD    Onset Date/Surgical Date  --   about 6 months   Next MD Visit  in 2 weeks    Prior Therapy  HHPT following CVA 1 year ago, none for current issue      Precautions   Precautions  Fall  Balance Screen   Has the patient fallen in the past 6 months  --   denies more falls over last 2 months     Prior Function   Level of Independence  Independent    Vocation  Retired    Leisure  have lunch with my grandchildren      Cognition   Overall Cognitive Status  Within Functional Limits for tasks assessed      Observation/Other Assessments   Focus on Therapeutic Outcomes (FOTO)   n/a      Coordination   Heel Shin Test  --    9 Hole Peg Test  --      Functional Tests   Functional tests  --      Sit to Stand   Comments  30sec chair rise test: 13x, no UE      Posture/Postural Control   Posture/Postural Control  Postural limitations    Postural Limitations  Rounded Shoulders;Forward head;Increased thoracic kyphosis      Strength   Right Hip Flexion  5/5   5   Right Hip Extension  4/5   3-   Right Hip ABduction  4+/5   4   Left Hip Flexion  5/5   4+   Left Hip Extension  4/5   3-   Left Hip ABduction  4+/5   4   Right Knee Flexion  4/5   4   Right Knee Extension  5/5   5   Left Knee Flexion  4+/5    4+   Left Knee Extension  5/5   5   Right Ankle Dorsiflexion  4/5   4-   Left Ankle Dorsiflexion  4+/5   4-     Ambulation/Gait   Gait Comments  no L arm swing during gait noted, intermittent L foot shuffling/poor clearance      Balance   Balance Assessed  Yes      Static Standing Balance   Static Standing - Balance Support  No upper extremity supported    Static Standing Balance -  Activities   Single Leg Stance - Right Leg;Single Leg Stance - Left Leg    Static Standing Balance -  Single Leg Stance  6 sec bil LE's     Standardized Balance Assessment   Standardized Balance Assessment  Dynamic Gait Index      Dynamic Gait Index   Level Surface  Mild Impairment    Change in Gait Speed  Mild Impairment    Gait with Horizontal Head Turns  Mild Impairment    Gait with Vertical Head Turns  Moderate Impairment    Gait and Pivot Turn  Mild Impairment    Step Over Obstacle  Mild Impairment    Step Around Obstacles  Normal    Steps  Normal   pt with slight unsteadiness during task   Total Score  17      RLE Tone   RLE Tone  Within Functional Limits      LLE Tone   LLE Tone  Within Functional Limits          04/25/19 0001  Balance Exercises: Standing  Tandem Stance Eyes open;Intermittent upper extremity support;2 reps (2 reps bil (alt foot alignment))  SLS Eyes open;Intermittent upper extremity support;2 reps;10 secs (2 reps bil LE)    PT Education - 04/25/19 1751    Education Details  Educated on updated HEP and plan for continued therapy.    Person(s) Educated  Patient  Methods  Explanation;Handout    Comprehension  Verbalized understanding;Returned demonstration       PT Short Term Goals - 04/25/19 1751      PT SHORT TERM GOAL #1   Title  Pt will have improved bil SLS to 6 sec without UE to demo improved functional strength and reaction strategies of bil hips and ankles to maximize his gait and balance.     Time  2    Period  Weeks    Status  Achieved     Target Date  02/27/19      PT SHORT TERM GOAL #2   Title  Pt will have improved MMT by 1/2 grade throughout all deficient mm groups in order to maximize balance and reduce risk for falls.     Time  2    Period  Weeks    Status  Partially Met      PT SHORT TERM GOAL #3   Title  Pt will have improved DGI to 20/24 to demo improved dynamic balance and reduce his risk for falls during ambulation.    Time  2    Period  Weeks    Status  On-going        PT Long Term Goals - 04/25/19 1751      PT LONG TERM GOAL #1   Title  Pt will have improved bil SLS to 12 sec or > without UE to further demo improved ankle and hip strength and reactive strategies to reduce his risk for falls.    Time  4    Period  Weeks    Status  On-going      PT LONG TERM GOAL #2   Title  Pt will have improved MMT by 1 grade throughout all deficient mm groups in order to further maximize balance and reduce risk for falls.     Time  4    Period  Weeks    Status  On-going      PT LONG TERM GOAL #3   Title  Pt will report and demo reduced LLE shuffling during gait to 50% or better to demo improved functional strength and reduce risk for falls.     Time  4    Period  Weeks    Status  On-going        Plan - 04/25/19 1751    Clinical Impression Luke Solis is returning to physical therapy after ~ 2 month break due to office closure related to spread of COVID-19. He denies any falls since last therapy session and reports he has tried to do his HEP but not consistently, however is walking every day. Luke Solis has made some improvements in LE strength based on MMT but has not made any significant improvements in balance based on DGI and remains at risk for falling. He was educated today on plan to continue therapy with in session visits as he does not have the means to participate in telehealth and will be safer in clinic with guarding for balance training. He was provided updated HEP to challenge his balance at home with  intermittent UE support at a counter. Luke Solis will benefit from additional skilled physical therapy to reduce fall risk and improve mobility.   Personal Factors and Comorbidities  Age    Examination-Activity Limitations  Stand;Squat    Examination-Participation Restrictions  Yard Work;Community Activity    Stability/Clinical Decision Making  Stable/Uncomplicated    Rehab Potential  Good    PT Frequency  2x / week    PT Duration  4 weeks    PT Treatment/Interventions  ADLs/Self Care Home Management;Aquatic Therapy;Cryotherapy;Electrical Stimulation;Moist Heat;Ultrasound;DME Instruction;Gait training;Stair training;Functional mobility training;Therapeutic activities;Therapeutic exercise;Balance training;Neuromuscular re-education;Patient/family education;Orthotic Fit/Training;Manual techniques;Passive range of motion;Dry needling;Taping    PT Next Visit Plan  cotninue functional strengthening, balance training, gait training for arm swing    PT Home Exercise Plan  eval: bridging, SLR, standing heel and toe; 3/17: hip abd and ext with RTB, walking with water bottles; NEW: tandem, SLS at counter   Consulted and Agree with Plan of Care  Patient       Patient will benefit from skilled therapeutic intervention in order to improve the following deficits and impairments:  Abnormal gait, Decreased activity tolerance, Decreased balance, Decreased endurance, Decreased mobility, Difficulty walking, Decreased strength, Hypomobility, Increased fascial restricitons, Impaired flexibility, Increased muscle spasms, Improper body mechanics, Postural dysfunction  Visit Diagnosis: Unsteadiness on feet  History of falling  Muscle weakness (generalized)     Problem List Patient Active Problem List   Diagnosis Date Noted  . Obstructive sleep apnea treated with continuous positive airway pressure (CPAP) 09/05/2018  . Altered mental status 02/28/2018  . Anemia 02/28/2018  . Fatigue 08/03/2017  . Prostate  cancer ([place]) 10/22/2015  . Forgetfulness 04/29/2015  . Type 2 diabetes mellitus with diabetic neuropathy ([place]) 07/17/2013  . Hyperlipidemia 01/19/2010  . OSA (obstructive sleep apnea) 01/19/2010  . Essential hypertension, benign 01/19/2010  . Coronary atherosclerosis of native coronary artery 01/19/2010    Kipp Brood, PT, DPT, [facility] Physical Therapist with [facility]  04/25/2019 5:52 PM    [facility] [address] [place], [place], [address] Phone: [phone]   Fax:  [phone]  Name: NAMEER SUMMER MRN: [id number] Date of Birth: 1931/05/17

## 2019-04-26 ENCOUNTER — Encounter: Payer: Self-pay | Admitting: *Deleted

## 2019-05-04 ENCOUNTER — Ambulatory Visit (HOSPITAL_COMMUNITY): Payer: Medicare Other | Admitting: Physical Therapy

## 2019-05-04 ENCOUNTER — Other Ambulatory Visit: Payer: Self-pay

## 2019-05-04 DIAGNOSIS — Z9181 History of falling: Secondary | ICD-10-CM

## 2019-05-04 DIAGNOSIS — R2681 Unsteadiness on feet: Secondary | ICD-10-CM

## 2019-05-04 DIAGNOSIS — M6281 Muscle weakness (generalized): Secondary | ICD-10-CM

## 2019-05-04 NOTE — Therapy (Signed)
[place], [place], [address] Phone: [phone]   Fax:  [phone]  Physical Therapy Treatment  Patient Details  Name: Luke Solis MRN: [id number] Date of Birth: [birth year] Referring Provider (PT): Kathyrn Drown, MD   Encounter Date: 05/04/2019  PT End of Session - 05/04/19 1353    Visit Number  2    Number of Visits  8    Date for PT Re-Evaluation  05/23/19    Authorization Type  Medicare (Secondary: [employer])    Authorization Time Period  02/13/19 to ; 04/25/19-05/23/19    Authorization - Visit Number  2    Authorization - Number of Visits  10    PT Start Time  0913    PT Stop Time  0945    PT Time Calculation (min)  32 min    Equipment Utilized During Treatment  Gait belt    Activity Tolerance  Patient tolerated treatment well    Behavior During Therapy  [facility] for tasks assessed/performed       Past Medical History:  Diagnosis Date  . Coronary atherosclerosis of native coronary artery    BMS proximal and mid RCA as well as BMS circumflex - 2002 (had residual 70% distal LAD), LVEF 60%  . Diabetes mellitus without complication ([place])   . Essential hypertension, benign   . Hyperlipidemia   . Neuropathy   . NSTEMI (non-ST elevated myocardial infarction) ([place])    2002  . Prostate cancer ([place])   . Sleep apnea    On CPAP  . Stroke [facility])     Past Surgical History:  Procedure Laterality Date  . COLONOSCOPY    . INGUINAL HERNIA REPAIR    . KNEE SURGERY    . NOSE SURGERY    . PROSTATE SURGERY    . TONSILLECTOMY      There were no vitals filed for this visit.  Subjective Assessment - 05/04/19 0918    Subjective  pt was 10 minutes late for appt today.   States he has no issues or pain today.    Currently in Pain?  No/denies                       [facility] Adult PT Treatment/Exercise - 05/04/19 0001      Knee/Hip Exercises: Standing   Heel Raises  Both;20 reps    Heel Raises Limitations   heel and toe    Forward Lunges  Both;10 reps;Limitations    Forward Lunges Limitations  onto 4" step    Hip Abduction  Both;2 sets;10 reps    Hip Extension  Both;2 sets;10 reps    Lateral Step Up  Both;10 reps    Forward Step Up  Both;10 reps    Step Down  Both;10 reps    Functional Squat  2 sets;10 reps    Gait Training  x2 laps focusing on arm swing (x1 lap with bil PVC pipe and x1 lap with verbal cuing)    Other Standing Knee Exercises  sidestep 51f x 2RT RTB      Knee/Hip Exercises: Seated   Sit to Sand  without UE support;1 set               PT Short Term Goals - 04/25/19 1751      PT SHORT TERM GOAL #1   Title  Pt will have improved bil SLS to 6 sec without UE to demo improved functional strength  and reaction strategies of bil hips and ankles to maximize his gait and balance.     Time  2    Period  Weeks    Status  Achieved    Target Date  02/27/19      PT SHORT TERM GOAL #2   Title  Pt will have improved MMT by 1/2 grade throughout all deficient mm groups in order to maximize balance and reduce risk for falls.     Time  2    Period  Weeks    Status  Partially Met      PT SHORT TERM GOAL #3   Title  Pt will have improved DGI to 20/24 to demo improved dynamic balance and reduce his risk for falls during ambulation.    Time  2    Period  Weeks    Status  On-going        PT Long Term Goals - 04/25/19 1751      PT LONG TERM GOAL #1   Title  Pt will have improved bil SLS to 12 sec or > without UE to further demo improved ankle and hip strength and reactive strategies to reduce his risk for falls.    Time  4    Period  Weeks    Status  On-going      PT LONG TERM GOAL #2   Title  Pt will have improved MMT by 1 grade throughout all deficient mm groups in order to further maximize balance and reduce risk for falls.     Time  4    Period  Weeks    Status  On-going      PT LONG TERM GOAL #3   Title  Pt will report and demo reduced LLE shuffling during gait  to 50% or better to demo improved functional strength and reduce risk for falls.     Time  4    Period  Weeks    Status  On-going            Plan - 05/04/19 1354    Clinical Impression Statement  Reveiwed goals and POC moving forward.  REsumed established therex and added additional for LE strength and stability.    pt tends to rely heavily on UE's to assist with LE's.  Contstant cues required for posturing and overall exercise insturction.  Pt required 2 eated rests during session today.     Personal Factors and Comorbidities  Age    Examination-Activity Limitations  Stand;Squat    Examination-Participation Restrictions  Yard Work;Community Activity    Stability/Clinical Decision Making  Stable/Uncomplicated    Rehab Potential  Good    PT Frequency  2x / week    PT Duration  4 weeks    PT Treatment/Interventions  ADLs/Self Care Home Management;Aquatic Therapy;Cryotherapy;Electrical Stimulation;Moist Heat;Ultrasound;DME Instruction;Gait training;Stair training;Functional mobility training;Therapeutic activities;Therapeutic exercise;Balance training;Neuromuscular re-education;Patient/family education;Orthotic Fit/Training;Manual techniques;Passive range of motion;Dry needling;Taping    PT Next Visit Plan  cotninue functional strengthening, balance training, gait training for arm swing    PT Home Exercise Plan  eval: bridging, SLR, standing heel and toe; 3/17: hip abd and ext with RTB, walking with water bottles. NEW: tandem, SLS at counter;    Consulted and Agree with Plan of Care  Patient       Patient will benefit from skilled therapeutic intervention in order to improve the following deficits and impairments:  Abnormal gait, Decreased activity tolerance, Decreased balance, Decreased endurance, Decreased mobility, Difficulty walking, Decreased strength, Hypomobility,  Increased fascial restricitons, Impaired flexibility, Increased muscle spasms, Improper body mechanics, Postural  dysfunction  Visit Diagnosis: Unsteadiness on feet  History of falling  Muscle weakness (generalized)     Problem List Patient Active Problem List   Diagnosis Date Noted  . Obstructive sleep apnea treated with continuous positive airway pressure (CPAP) 09/05/2018  . Altered mental status 02/28/2018  . Anemia 02/28/2018  . Fatigue 08/03/2017  . Prostate cancer ([place]) 10/22/2015  . Forgetfulness 04/29/2015  . Type 2 diabetes mellitus with diabetic neuropathy ([place]) 07/17/2013  . Hyperlipidemia 01/19/2010  . OSA (obstructive sleep apnea) 01/19/2010  . Essential hypertension, benign 01/19/2010  . Coronary atherosclerosis of native coronary artery 01/19/2010   Teena Irani, PTA/CLT [phone]  Teena Irani 05/04/2019, 1:57 PM  [place] [address] [place], [place], [address] Phone: [phone]   Fax:  [phone]  Name: Luke Solis MRN: [id number] Date of Birth: 1931/11/02

## 2019-05-07 ENCOUNTER — Telehealth: Payer: Self-pay | Admitting: *Deleted

## 2019-05-07 ENCOUNTER — Encounter: Payer: Self-pay | Admitting: Adult Health

## 2019-05-07 ENCOUNTER — Other Ambulatory Visit: Payer: Self-pay | Admitting: Family Medicine

## 2019-05-07 NOTE — Telephone Encounter (Signed)
Due to current COVID 19 pandemic, our office is severely reducing in office visits until further notice, in order to minimize the risk to our patients and healthcare providers. No email or computer, phone with camera.  Made telephone visit.

## 2019-05-09 ENCOUNTER — Ambulatory Visit (INDEPENDENT_AMBULATORY_CARE_PROVIDER_SITE_OTHER): Payer: Medicare Other | Admitting: Adult Health

## 2019-05-09 ENCOUNTER — Other Ambulatory Visit: Payer: Self-pay

## 2019-05-09 DIAGNOSIS — G4733 Obstructive sleep apnea (adult) (pediatric): Secondary | ICD-10-CM | POA: Diagnosis not present

## 2019-05-09 DIAGNOSIS — Z9989 Dependence on other enabling machines and devices: Secondary | ICD-10-CM

## 2019-05-09 NOTE — Progress Notes (Addendum)
  [facility] [address] [place]. [place] [address] [phone]     Virtual Visit via Telephone Note  I connected with Luke Solis on 05/09/19 at 11:00 AM EDT by telephone located remotely at [facility] and verified that I am speaking with the correct person using two identifiers who reports being located at home.    Visit scheduled by RN. She discussed the limitations, risks, security and privacy concerns of performing an evaluation and management service by telephone and the availability of in person appointments. I also discussed with the patient that there may be a patient responsible charge related to this service. The patient expressed understanding and agreed to proceed. See telephone note for consent and additional scheduling information.    History of Present Illness:  Luke Solis is a 85 y.o. male who has been followed in this office for obstructive sleep apnea.  He was initially scheduled for face-to-face office follow up visit today time but due to [place], visit rescheduled for non-face-to-face telephone visit with patients consent. Unable to participate in video visit due to lack of access to device with camera.    Today 05/09/19 Luke Solis is an 85 year old male with a history of obstructive sleep apnea on CPAP.  His download indicates that he uses machine 30 out of 30 days for compliance of 100%.  He uses machine greater than 4 hours 28 out of 30 days for compliance of 93%.  On average he uses his machine 6 hours and 9 minutes.  His residual AHI is 18 on 11 cm of water with EPR of 3.  His leak in the 95th percentile is 40.2L/Min.  He states that he does not feel the mask leaking.  He did go for mask refitting at the last visit however he states that it took 2 months to get the new supplies in.   Observations/Objective:   Neurological examination  Mentation: Alert oriented to time, place, history taking. speech and language fluent   Assessment and Plan:  1.  Obstructive sleep apnea on CPAP  The patient CPAP download shows good compliance but his AHI is elevated.  He does have a leak.  I will send an order for a mask refitting.  He states that last time he had a mask refitting it took 2 months to get his supplies.  Advised that we will call his DME company and try to set up the mask refitting for him.  He is encouraged to continue using his CPAP nightly and greater than 4 hours each night.  I will review his download in 2 months and call to let him know if  there has been improvement.  Follow Up Instructions:  FU 2 months- I will call him       I discussed the assessment and treatment plan with the patient.  The patient was provided an opportunity to ask questions and all were answered to their satisfaction. The patient agreed with the plan and verbalized an understanding of the instructions.   I provided 15 minutes of non-face-to-face time during this encounter.    Luke Givens, NP-C  [facility] [address] [place], [place] [address]  Phone [phone] Fax [phone]      I reviewed the above note and documentation by the Nurse Practitioner and agree with the history, exam, assessment and plan as outlined above. I was immediately available for consultation. Luke Age, MD, PhD [facility])

## 2019-05-11 ENCOUNTER — Encounter (HOSPITAL_COMMUNITY): Payer: Self-pay

## 2019-05-11 ENCOUNTER — Other Ambulatory Visit: Payer: Self-pay

## 2019-05-11 ENCOUNTER — Ambulatory Visit (HOSPITAL_COMMUNITY): Payer: Medicare Other | Attending: Family Medicine

## 2019-05-11 DIAGNOSIS — Z9181 History of falling: Secondary | ICD-10-CM

## 2019-05-11 DIAGNOSIS — R2681 Unsteadiness on feet: Secondary | ICD-10-CM | POA: Diagnosis not present

## 2019-05-11 DIAGNOSIS — M6281 Muscle weakness (generalized): Secondary | ICD-10-CM | POA: Insufficient documentation

## 2019-05-11 NOTE — Therapy (Signed)
[place], [place], [address] Phone: [phone]   Fax:  [phone]  Physical Therapy Treatment  Patient Details  Name: Luke Solis MRN: [id number] Date of Birth: [birth year] Referring Provider (PT): Kathyrn Drown, MD   Encounter Date: 05/11/2019  PT End of Session - 05/11/19 1200    Visit Number  3    Number of Visits  8    Date for PT Re-Evaluation  05/23/19    Authorization Type  Medicare (Secondary: AARP)    Authorization Time Period  02/13/19 to ; 04/25/19-05/23/19    Authorization - Visit Number  3    Authorization - Number of Visits  10    PT Start Time  1119    PT Stop Time  1200    PT Time Calculation (min)  41 min    Equipment Utilized During Treatment  Gait belt    Activity Tolerance  Patient tolerated treatment well    Behavior During Therapy  [facility] for tasks assessed/performed       Past Medical History:  Diagnosis Date  . Coronary atherosclerosis of native coronary artery    BMS proximal and mid RCA as well as BMS circumflex - 2002 (had residual 70% distal LAD), LVEF 60%  . Diabetes mellitus without complication ([place])   . Essential hypertension, benign   . Hyperlipidemia   . Neuropathy   . NSTEMI (non-ST elevated myocardial infarction) ([place])    2002  . Prostate cancer ([place])   . Sleep apnea    On CPAP  . Stroke [facility])     Past Surgical History:  Procedure Laterality Date  . COLONOSCOPY    . INGUINAL HERNIA REPAIR    . KNEE SURGERY    . NOSE SURGERY    . PROSTATE SURGERY    . TONSILLECTOMY      There were no vitals filed for this visit.  Subjective Assessment - 05/11/19 1122    Subjective  Patient reports he is doing well and is still doing his HEP daily. He has greater difficulty with tandem when his Lt foot in back compared to his Rt in back.     Pertinent History  CVA 1YA    Limitations  Walking;Standing    Patient Stated Goals  not to fall    Currently in Pain?  No/denies        [facility] Adult PT Treatment/Exercise - 05/11/19 0001      Knee/Hip Exercises: Standing   Heel Raises  Both;1 set;15 reps;3 seconds    Heel Raises Limitations  1x 15 resp, 3 sec holds, on decline    Forward Lunges  Both;10 reps;1 set    Forward Lunges Limitations  onto 4" step    Lateral Step Up  Both;1 set;15 reps;Hand Hold: 2;Step Height: 4"    Forward Step Up  Both;1 set;15 reps;Hand Hold: 2;Step Height: 4"    Step Down  --    Gait Training  x2 laps focusing on arm swing (x1 lap with bil PVC pipe and x1 lap with verbal cuing)      Knee/Hip Exercises: Seated   Sit to Sand  2 sets;10 reps;without UE support       Balance Exercises - 05/11/19 1158      Balance Exercises: Standing   Tandem Stance  Eyes open;Foam/compliant surface;4 reps;15 secs;Intermittent upper extremity support   4x alt foot alignment   Step Over Hurdles / Cones  3x RT Lateral stepping in //  bars over 4x 6" hurdles        PT Education - 05/11/19 1221    Education Details  Educated on exercises throughout and to continue with current HEP.    Person(s) Educated  Patient    Methods  Explanation    Comprehension  Verbalized understanding;Returned demonstration       PT Short Term Goals - 04/25/19 1751      PT SHORT TERM GOAL #1   Title  Pt will have improved bil SLS to 6 sec without UE to demo improved functional strength and reaction strategies of bil hips and ankles to maximize his gait and balance.     Time  2    Period  Weeks    Status  Achieved    Target Date  02/27/19      PT SHORT TERM GOAL #2   Title  Pt will have improved MMT by 1/2 grade throughout all deficient mm groups in order to maximize balance and reduce risk for falls.     Time  2    Period  Weeks    Status  Partially Met      PT SHORT TERM GOAL #3   Title  Pt will have improved DGI to 20/24 to demo improved dynamic balance and reduce his risk for falls during ambulation.    Time  2    Period  Weeks    Status  On-going         PT Long Term Goals - 04/25/19 1751      PT LONG TERM GOAL #1   Title  Pt will have improved bil SLS to 12 sec or > without UE to further demo improved ankle and hip strength and reactive strategies to reduce his risk for falls.    Time  4    Period  Weeks    Status  On-going      PT LONG TERM GOAL #2   Title  Pt will have improved MMT by 1 grade throughout all deficient mm groups in order to further maximize balance and reduce risk for falls.     Time  4    Period  Weeks    Status  On-going      PT LONG TERM GOAL #3   Title  Pt will report and demo reduced LLE shuffling during gait to 50% or better to demo improved functional strength and reduce risk for falls.     Time  4    Period  Weeks    Status  On-going          Plan - 05/11/19 1221    Clinical Impression Statement  Continued with current POC to focus on functional LE strengthening and balance training. Patient continued with step ups and used Bil UE support for majority of exercise however attempted last 3 reps of each set with no UE use and maintained balance with min guard. He initiated forward and lateral hurdle negotiation and on last set for forward gait he did not require UE support. Gait training was continued to facilitate improved arm swing and 1st lap patient required tactile cue with PVC to aid aid swing. He was able to maintain swing during 2nd lap with verbal cues. He will continue to benefit from additional skilled physical therapy to reduce fall risk and improve mobility.    Personal Factors and Comorbidities  Age    Examination-Activity Limitations  Stand;Squat    Examination-Participation Restrictions  Yard Work;Community Activity  Stability/Clinical Decision Making  Stable/Uncomplicated    Rehab Potential  Good    PT Frequency  2x / week    PT Duration  4 weeks    PT Treatment/Interventions  ADLs/Self Care Home Management;Aquatic Therapy;Cryotherapy;Electrical Stimulation;Moist Heat;Ultrasound;DME  Instruction;Gait training;Stair training;Functional mobility training;Therapeutic activities;Therapeutic exercise;Balance training;Neuromuscular re-education;Patient/family education;Orthotic Fit/Training;Manual techniques;Passive range of motion;Dry needling;Taping    PT Next Visit Plan  cotninue functional strengthening, balance training, gait training for arm swing. add rockerboard for balance next session.     PT Home Exercise Plan  eval: bridging, SLR, standing heel and toe; 3/17: hip abd and ext with RTB, walking with water bottles. NEW: tandem, SLS at counter;    Consulted and Agree with Plan of Care  Patient       Patient will benefit from skilled therapeutic intervention in order to improve the following deficits and impairments:  Abnormal gait, Decreased activity tolerance, Decreased balance, Decreased endurance, Decreased mobility, Difficulty walking, Decreased strength, Hypomobility, Increased fascial restricitons, Impaired flexibility, Increased muscle spasms, Improper body mechanics, Postural dysfunction  Visit Diagnosis: Unsteadiness on feet  History of falling  Muscle weakness (generalized)     Problem List Patient Active Problem List   Diagnosis Date Noted  . Obstructive sleep apnea treated with continuous positive airway pressure (CPAP) 09/05/2018  . Altered mental status 02/28/2018  . Anemia 02/28/2018  . Fatigue 08/03/2017  . Prostate cancer ([place]) 10/22/2015  . Forgetfulness 04/29/2015  . Type 2 diabetes mellitus with diabetic neuropathy ([place]) 07/17/2013  . Hyperlipidemia 01/19/2010  . OSA (obstructive sleep apnea) 01/19/2010  . Essential hypertension, benign 01/19/2010  . Coronary atherosclerosis of native coronary artery 01/19/2010     Kipp Brood, PT, DPT, [facility] Physical Therapist with [facility]  05/11/2019 12:22 PM     [place] [address] [place], [place], [address] Phone:  [phone]   Fax:  [phone]  Name: CRUZ BONG MRN: [id number] Date of Birth: August 07, 1931

## 2019-05-14 ENCOUNTER — Telehealth: Payer: Self-pay | Admitting: *Deleted

## 2019-05-14 NOTE — Telephone Encounter (Signed)
I called pt and relayed checking with him to see if he had been in contact with [employer].  He stated yes he had, they were able to change the pressure and a mask was to be sent to him.  He felt like things had been taken care of.

## 2019-05-16 ENCOUNTER — Ambulatory Visit (HOSPITAL_COMMUNITY): Payer: Medicare Other | Admitting: Physical Therapy

## 2019-05-16 ENCOUNTER — Other Ambulatory Visit: Payer: Self-pay

## 2019-05-16 ENCOUNTER — Encounter (HOSPITAL_COMMUNITY): Payer: Self-pay | Admitting: Physical Therapy

## 2019-05-16 DIAGNOSIS — Z9181 History of falling: Secondary | ICD-10-CM

## 2019-05-16 DIAGNOSIS — M6281 Muscle weakness (generalized): Secondary | ICD-10-CM

## 2019-05-16 DIAGNOSIS — R2681 Unsteadiness on feet: Secondary | ICD-10-CM

## 2019-05-16 NOTE — Therapy (Signed)
[facility] [place], [place], [address] Phone: [phone]   Fax:  [phone]  Physical Therapy Treatment  Patient Details  Name: Luke Solis MRN: [id number] Date of Birth: [birth year] Referring Provider (PT): Kathyrn Drown, MD   Encounter Date: 05/16/2019  PT End of Session - 05/16/19 1145    Visit Number  4    Number of Visits  8    Date for PT Re-Evaluation  05/23/19    Authorization Type  Medicare (Secondary: AARP)    Authorization Time Period  02/13/19 to ; 04/25/19-05/23/19    Authorization - Visit Number  4    Authorization - Number of Visits  10    PT Start Time  1937   Patient arrived late   PT Stop Time  1221    PT Time Calculation (min)  38 min    Equipment Utilized During Treatment  Gait belt    Activity Tolerance  Patient tolerated treatment well    Behavior During Therapy  [facility] for tasks assessed/performed       Past Medical History:  Diagnosis Date  . Coronary atherosclerosis of native coronary artery    BMS proximal and mid RCA as well as BMS circumflex - 2002 (had residual 70% distal LAD), LVEF 60%  . Diabetes mellitus without complication ([place])   . Essential hypertension, benign   . Hyperlipidemia   . Neuropathy   . NSTEMI (non-ST elevated myocardial infarction) ([place])    2002  . Prostate cancer ([place])   . Sleep apnea    On CPAP  . Stroke [facility])     Past Surgical History:  Procedure Laterality Date  . COLONOSCOPY    . INGUINAL HERNIA REPAIR    . KNEE SURGERY    . NOSE SURGERY    . PROSTATE SURGERY    . TONSILLECTOMY      There were no vitals filed for this visit.  Subjective Assessment - 05/16/19 1145    Subjective  Patient reported that he is not having any pain today.     Pertinent History  CVA 1YA    Limitations  Walking;Standing    Patient Stated Goals  not to fall    Currently in Pain?  No/denies                       [facility] Adult PT Treatment/Exercise -  05/16/19 0001      Knee/Hip Exercises: Standing   Heel Raises  Both;1 set;15 reps;3 seconds    Heel Raises Limitations  1x 15 resp, 3 sec holds, on decline; Toe raises 1x15    Forward Lunges  Both;10 reps;1 set    Forward Lunges Limitations  onto 4" step    Lateral Step Up  Both;1 set;15 reps;Hand Hold: 2;Step Height: 4"    Forward Step Up  Both;1 set;15 reps;Hand Hold: 2;Step Height: 4"    Rocker Board  Limitations    Rocker Board Limitations  A/P and Lt./Rt. 2 x 1 minute with cues to not let edges touch the ground. Patient requiring intermittent UE assistance      Knee/Hip Exercises: Seated   Other Seated Knee/Hip Exercises  Seated marching over 6'' hurdle x 10 each lower extremity    Sit to Sand  2 sets;10 reps;without UE support          Balance Exercises - 05/16/19 1232      Balance Exercises: Standing   Tandem Stance  Eyes open;4 reps;15 secs;Intermittent upper extremity support   4x each LE forward   Step Over Hurdles / Cones  3x RT Forward and Lateral stepping in // bars over 4x 6" hurdles          PT Short Term Goals - 04/25/19 1751      PT SHORT TERM GOAL #1   Title  Pt will have improved bil SLS to 6 sec without UE to demo improved functional strength and reaction strategies of bil hips and ankles to maximize his gait and balance.     Time  2    Period  Weeks    Status  Achieved    Target Date  02/27/19      PT SHORT TERM GOAL #2   Title  Pt will have improved MMT by 1/2 grade throughout all deficient mm groups in order to maximize balance and reduce risk for falls.     Time  2    Period  Weeks    Status  Partially Met      PT SHORT TERM GOAL #3   Title  Pt will have improved DGI to 20/24 to demo improved dynamic balance and reduce his risk for falls during ambulation.    Time  2    Period  Weeks    Status  On-going        PT Long Term Goals - 04/25/19 1751      PT LONG TERM GOAL #1   Title  Pt will have improved bil SLS to 12 sec or > without UE  to further demo improved ankle and hip strength and reactive strategies to reduce his risk for falls.    Time  4    Period  Weeks    Status  On-going      PT LONG TERM GOAL #2   Title  Pt will have improved MMT by 1 grade throughout all deficient mm groups in order to further maximize balance and reduce risk for falls.     Time  4    Period  Weeks    Status  On-going      PT LONG TERM GOAL #3   Title  Pt will report and demo reduced LLE shuffling during gait to 50% or better to demo improved functional strength and reduce risk for falls.     Time  4    Period  Weeks    Status  On-going            Plan - 05/16/19 1236    Clinical Impression Statement  Continued with a focus on balance activities this session. Added rockerboard anterior/posterior and left right as well as added forward ambulation over 6'' hurdles this date inside parallel bars. Patient required intermittent upper extremity assistance when stepping over hurdles. In addition, patient did step on the hurdles a few times while performing the exercise. Noted with hurdles that patient had decreased proprioception of lower extremities. Added stepping over 6'' hurdle in sitting to further practice proprioception and hip flexor strengthening in sitting.     Personal Factors and Comorbidities  Age    Examination-Activity Limitations  Stand;Squat    Examination-Participation Restrictions  Yard Work;Community Activity    Stability/Clinical Decision Making  Stable/Uncomplicated    Rehab Potential  Good    PT Frequency  2x / week    PT Duration  4 weeks    PT Treatment/Interventions  ADLs/Self Care Home Management;Aquatic Therapy;Cryotherapy;Electrical Stimulation;Moist Heat;Ultrasound;DME Instruction;Gait training;Stair training;Functional mobility training;Therapeutic  activities;Therapeutic exercise;Balance training;Neuromuscular re-education;Patient/family education;Orthotic Fit/Training;Manual techniques;Passive range of  motion;Dry needling;Taping    PT Next Visit Plan  cotninue functional strengthening, balance training, gait training for arm swing. Add stepping towards targets next session, possibly colored dots or cones.     PT Home Exercise Plan  eval: bridging, SLR, standing heel and toe; 3/17: hip abd and ext with RTB, walking with water bottles. NEW: tandem, SLS at counter;    Consulted and Agree with Plan of Care  Patient       Patient will benefit from skilled therapeutic intervention in order to improve the following deficits and impairments:  Abnormal gait, Decreased activity tolerance, Decreased balance, Decreased endurance, Decreased mobility, Difficulty walking, Decreased strength, Hypomobility, Increased fascial restricitons, Impaired flexibility, Increased muscle spasms, Improper body mechanics, Postural dysfunction  Visit Diagnosis: Unsteadiness on feet  History of falling  Muscle weakness (generalized)     Problem List Patient Active Problem List   Diagnosis Date Noted  . Obstructive sleep apnea treated with continuous positive airway pressure (CPAP) 09/05/2018  . Altered mental status 02/28/2018  . Anemia 02/28/2018  . Fatigue 08/03/2017  . Prostate cancer ([place]) 10/22/2015  . Forgetfulness 04/29/2015  . Type 2 diabetes mellitus with diabetic neuropathy ([place]) 07/17/2013  . Hyperlipidemia 01/19/2010  . OSA (obstructive sleep apnea) 01/19/2010  . Essential hypertension, benign 01/19/2010  . Coronary atherosclerosis of native coronary artery 01/19/2010   Clarene Critchley PT, DPT 12:38 PM, 05/16/19 [place], [place], [address] Phone: [phone]   Fax:  [phone]  Name: NUNZIO BANET MRN: [id number] Date of Birth: October 22, 1931

## 2019-05-18 ENCOUNTER — Encounter: Payer: Self-pay | Admitting: Nurse Practitioner

## 2019-05-18 ENCOUNTER — Ambulatory Visit (INDEPENDENT_AMBULATORY_CARE_PROVIDER_SITE_OTHER): Payer: Medicare Other | Admitting: Nurse Practitioner

## 2019-05-18 ENCOUNTER — Other Ambulatory Visit: Payer: Self-pay

## 2019-05-18 VITALS — BP 140/82 | Temp 98.0°F | Wt 167.0 lb

## 2019-05-18 DIAGNOSIS — R234 Changes in skin texture: Secondary | ICD-10-CM

## 2019-05-18 DIAGNOSIS — S40862A Insect bite (nonvenomous) of left upper arm, initial encounter: Secondary | ICD-10-CM

## 2019-05-18 NOTE — Progress Notes (Signed)
   Subjective:    Patient ID: Luke Solis, male    DOB: 02-01-1931, 85 y.o.   MRN: [id number]  HPI Tick removal Presents with complaints of a possible embedded tick first noticed this morning in his left axillary area.  States he stays outside a lot and frequently has ticks.  Review of Systems No fever headache or rash.    Objective:   Physical Exam NAD.  Alert, oriented.  A tiny brown raised area noted in the left axillary area with minimal pink discoloration. Entire lesion removed without difficulty. Area dry and flaky consistent with a scab. Examination with magnifier did not reveal the body of a tick. Pink healthy tissue noted.       Assessment & Plan:  Possible tick bite left axillary area  As a precaution, given written and verbal information on tick disease and localized skin infection. Call back if further problems.

## 2019-05-18 NOTE — Patient Instructions (Signed)

## 2019-05-19 ENCOUNTER — Encounter: Payer: Self-pay | Admitting: Nurse Practitioner

## 2019-05-23 ENCOUNTER — Ambulatory Visit (HOSPITAL_COMMUNITY): Payer: Medicare Other

## 2019-05-23 ENCOUNTER — Telehealth (HOSPITAL_COMMUNITY): Payer: Self-pay

## 2019-05-23 NOTE — Telephone Encounter (Signed)
No show #1; PT called regarding no show. His wife states that he forgot all about the appointment. Today was his last scheduled appointment and transferred her to the front office to get him rescheduled.    Geraldine Solar PT, DPT

## 2019-05-28 ENCOUNTER — Other Ambulatory Visit: Payer: Self-pay

## 2019-05-28 ENCOUNTER — Encounter (HOSPITAL_COMMUNITY): Payer: Self-pay

## 2019-05-28 ENCOUNTER — Ambulatory Visit (HOSPITAL_COMMUNITY): Payer: Medicare Other

## 2019-05-28 DIAGNOSIS — R2681 Unsteadiness on feet: Secondary | ICD-10-CM | POA: Diagnosis not present

## 2019-05-28 DIAGNOSIS — M6281 Muscle weakness (generalized): Secondary | ICD-10-CM

## 2019-05-28 DIAGNOSIS — Z9181 History of falling: Secondary | ICD-10-CM | POA: Diagnosis not present

## 2019-05-28 NOTE — Therapy (Signed)
[facility] [address] [place], [place], [address] Phone: [phone]   Fax:  [phone]   PHYSICAL THERAPY DISCHARGE SUMMARY  Visits from Start of Care: 5  Current functional level related to goals / functional outcomes: See below   Remaining deficits: See below   Education / Equipment: HEP  Plan: Patient agrees to discharge.  Patient goals were partially met. Patient is being discharged due to being pleased with the current functional level.  ?????       Physical Therapy Treatment  Patient Details  Name: Luke Solis MRN: [id number] Date of Birth: 03-29-1931 Referring Provider (PT): Kathyrn Drown, MD   Encounter Date: 05/28/2019  PT End of Session - 05/28/19 1013    Visit Number  5    Number of Visits  8    Date for PT Re-Evaluation  05/23/19    Authorization Type  Medicare (Secondary: AARP)    Authorization Time Period  02/13/19 to ; 04/25/19-05/23/19    Authorization - Visit Number  5    Authorization - Number of Visits  10    PT Start Time  1015    PT Stop Time  1042    PT Time Calculation (min)  27 min    Equipment Utilized During Treatment  Gait belt    Activity Tolerance  Patient tolerated treatment well    Behavior During Therapy  WFL for tasks assessed/performed       Past Medical History:  Diagnosis Date  . Coronary atherosclerosis of native coronary artery    BMS proximal and mid RCA as well as BMS circumflex - 2002 (had residual 70% distal LAD), LVEF 60%  . Diabetes mellitus without complication ([place])   . Essential hypertension, benign   . Hyperlipidemia   . Neuropathy   . NSTEMI (non-ST elevated myocardial infarction) ([place])    2002  . Prostate cancer ([place])   . Sleep apnea    On CPAP  . Stroke [facility])     Past Surgical History:  Procedure Laterality Date  . COLONOSCOPY    . INGUINAL HERNIA REPAIR    . KNEE SURGERY    . NOSE SURGERY    . PROSTATE SURGERY    . TONSILLECTOMY      There were  no vitals filed for this visit.  Subjective Assessment - 05/28/19 1015    Subjective  Pt reports no falls, he is working remodeling a house, and says he feels good. Pt reports no more issues and he "feels pretty good".    Pertinent History  CVA 1YA    Limitations  Walking;Standing    How long can you sit comfortably?  no issues    How long can you stand comfortably?  "a long time"    How long can you walk comfortably?  "I kinda shuffle along"    Patient Stated Goals  not to fall    Currently in Pain?  No/denies         [facility] PT Assessment - 05/28/19 0001      Assessment   Medical Diagnosis  Frequent falls    Referring Provider (PT)  Kathyrn Drown, MD    Next MD Visit  in 2 weeks    Prior Therapy  HHPT following CVA 1 year ago, none for current issue      Strength   Right Hip Flexion  5/5   was 5   Right Hip Extension  4/5   was 4  Right Hip ABduction  4+/5   was 4+   Left Hip Flexion  5/5   was 5   Left Hip Extension  4/5   was 4   Left Hip ABduction  5/5   was 4+   Right Knee Flexion  5/5   was 4   Right Knee Extension  5/5   was 5   Left Knee Flexion  5/5   was 4+   Left Knee Extension  5/5   was 5   Right Ankle Dorsiflexion  4+/5   was 4   Left Ankle Dorsiflexion  4+/5   was 4+     Static Standing Balance   Static Standing - Balance Support  No upper extremity supported    Static Standing Balance -  Activities   Single Leg Stance - Right Leg;Single Leg Stance - Left Leg    Static Standing - Comment/# of Minutes  4sec or < BLE      Standardized Balance Assessment   Standardized Balance Assessment  Dynamic Gait Index      Dynamic Gait Index   Level Surface  Mild Impairment    Change in Gait Speed  Mild Impairment    Gait with Horizontal Head Turns  Mild Impairment    Gait with Vertical Head Turns  Mild Impairment    Gait and Pivot Turn  Normal    Step Over Obstacle  Normal    Step Around Obstacles  Mild Impairment    Steps  Mild Impairment    Total  Score  18            PT Education - 05/28/19 1012    Education Details  reassessment findings, updated HEP, exercise technique, discharge    Person(s) Educated  Patient    Methods  Explanation    Comprehension  Verbalized understanding       PT Short Term Goals - 05/28/19 1034      PT SHORT TERM GOAL #1   Title  Pt will have improved bil SLS to 6 sec without UE to demo improved functional strength and reaction strategies of bil hips and ankles to maximize his gait and balance.     Baseline  6/22: 4 sec or less; was 6 sec at last reassessment    Time  2    Period  Weeks    Status  Partially Met    Target Date  02/27/19      PT SHORT TERM GOAL #2   Title  Pt will have improved MMT by 1/2 grade throughout all deficient mm groups in order to maximize balance and reduce risk for falls.     Baseline  6/22: see MMT    Time  2    Period  Weeks    Status  Partially Met      PT SHORT TERM GOAL #3   Title  Pt will have improved DGI to 20/24 to demo improved dynamic balance and reduce his risk for falls during ambulation.    Baseline  6/22: 18/24    Time  2    Period  Weeks    Status  Partially Met        PT Long Term Goals - 05/28/19 1032      PT LONG TERM GOAL #1   Title  Pt will have improved bil SLS to 12 sec or > without UE to further demo improved ankle and hip strength and reactive strategies to reduce his risk for falls.  Baseline  6/22: 4 sec or < BLE    Time  4    Period  Weeks    Status  On-going      PT LONG TERM GOAL #2   Title  Pt will have improved MMT by 1 grade throughout all deficient mm groups in order to further maximize balance and reduce risk for falls.     Baseline  6/22: see MMT    Time  4    Period  Weeks    Status  Partially Met      PT LONG TERM GOAL #3   Title  Pt will report and demo reduced LLE shuffling during gait to 50% or better to demo improved functional strength and reduce risk for falls.     Baseline  6/22: continued with  intermittent shuffling 50%    Time  4    Period  Weeks    Status  Achieved            Plan - 05/28/19 1034    Clinical Impression Statement  PT reassessed pt's goals and outcome measures. Pt has partially met or met all goals except for his LTG for SLS. He was only able to hold for ~4sec this date, but was able to do it for 6 sec last reassessment. His MMT has improved throughout almost all mm groups, and his DGI improved to 18/24 (was 16/24) on his initial eval. Pt reports that he is not limited with his walking or balance and denies any recent falls. Pt stating that he feels ready for d/c at this time. Updated HEP for pt to maintain progress made in therapy. Educated pt that he could return with referral if needed. Pt will be d/c to HEP due to pt progress and request.    Personal Factors and Comorbidities  Age    Examination-Activity Limitations  Stand;Squat    Examination-Participation Restrictions  Yard Work;Community Activity    Stability/Clinical Decision Making  Stable/Uncomplicated    Rehab Potential  Good    PT Frequency  2x / week    PT Duration  4 weeks    PT Treatment/Interventions  ADLs/Self Care Home Management;Aquatic Therapy;Cryotherapy;Electrical Stimulation;Moist Heat;Ultrasound;DME Instruction;Gait training;Stair training;Functional mobility training;Therapeutic activities;Therapeutic exercise;Balance training;Neuromuscular re-education;Patient/family education;Orthotic Fit/Training;Manual techniques;Passive range of motion;Dry needling;Taping    PT Next Visit Plan  d/c to HEP    PT Home Exercise Plan  eval: bridging, SLR, standing heel and toe; 3/17: hip abd and ext with RTB, walking with water bottles. NEW: tandem, SLS at counter; 6/22: STS, forward lateral step ups, mini squats, SLS vectors    Consulted and Agree with Plan of Care  Patient       Patient will benefit from skilled therapeutic intervention in order to improve the following deficits and impairments:   Abnormal gait, Decreased activity tolerance, Decreased balance, Decreased endurance, Decreased mobility, Difficulty walking, Decreased strength, Hypomobility, Increased fascial restricitons, Impaired flexibility, Increased muscle spasms, Improper body mechanics, Postural dysfunction  Visit Diagnosis: 1. Unsteadiness on feet   2. History of falling   3. Muscle weakness (generalized)        Problem List Patient Active Problem List   Diagnosis Date Noted  . Obstructive sleep apnea treated with continuous positive airway pressure (CPAP) 09/05/2018  . Altered mental status 02/28/2018  . Anemia 02/28/2018  . Fatigue 08/03/2017  . Prostate cancer ([place]) 10/22/2015  . Forgetfulness 04/29/2015  . Type 2 diabetes mellitus with diabetic neuropathy ([place]) 07/17/2013  . Hyperlipidemia 01/19/2010  .  OSA (obstructive sleep apnea) 01/19/2010  . Essential hypertension, benign 01/19/2010  . Coronary atherosclerosis of native coronary artery 01/19/2010     Geraldine Solar PT, DPT   [facility] [address] [place], [place], [address] Phone: [phone]   Fax:  [phone]  Name: ANDRAE CLAUNCH MRN: [id number] Date of Birth: [birth year]

## 2019-05-30 ENCOUNTER — Ambulatory Visit (INDEPENDENT_AMBULATORY_CARE_PROVIDER_SITE_OTHER): Payer: Medicare Other | Admitting: Family Medicine

## 2019-05-30 ENCOUNTER — Other Ambulatory Visit: Payer: Self-pay

## 2019-05-30 ENCOUNTER — Encounter: Payer: Self-pay | Admitting: Family Medicine

## 2019-05-30 VITALS — Temp 97.8°F

## 2019-05-30 DIAGNOSIS — L03116 Cellulitis of left lower limb: Secondary | ICD-10-CM | POA: Diagnosis not present

## 2019-05-30 DIAGNOSIS — T148XXA Other injury of unspecified body region, initial encounter: Secondary | ICD-10-CM

## 2019-05-30 MED ORDER — CEPHALEXIN 500 MG PO CAPS: 500.0000 mg | ORAL_CAPSULE | Freq: Four times a day (QID) | ORAL | 0 refills | Status: DC

## 2019-05-30 MED ORDER — MUPIROCIN 2 % EX OINT: TOPICAL_OINTMENT | CUTANEOUS | 0 refills | Status: DC

## 2019-05-30 NOTE — Progress Notes (Signed)
   Subjective:    Patient ID: Luke Solis, male    DOB: [birth year], 85 y.o.   MRN: [id number]  HPIsore on lower left leg. Does not know how he got it. Did not fall or hit anything that he remembers. Noticed it about 2 weeks ago. Does not seem to be healing. Using neosporin.  He does not know how this lower leg issue occurred.  He is a diabetic.  He did notice it about 2 weeks ago does not seem to be healing and denies any high fever chills sweats wheezing difficulty breathing   Review of Systems  Constitutional: Negative for diaphoresis and fatigue.  HENT: Negative for congestion and rhinorrhea.   Respiratory: Negative for cough and shortness of breath.   Cardiovascular: Negative for chest pain and leg swelling.  Gastrointestinal: Negative for abdominal pain and diarrhea.  Skin: Negative for color change and rash.  Neurological: Negative for dizziness and headaches.  Psychiatric/Behavioral: Negative for behavioral problems and confusion.       Objective:   Physical Exam Constitutional:      General: He is not in acute distress.    Appearance: He is well-developed.  HENT:     Head: Normocephalic.  Cardiovascular:     Rate and Rhythm: Normal rate and regular rhythm.     Heart sounds: Normal heart sounds. No murmur.  Pulmonary:     Effort: Pulmonary effort is normal.     Breath sounds: Normal breath sounds.  Skin:    General: Skin is warm and dry.  Neurological:     Mental Status: He is alert.  Psychiatric:        Behavior: Behavior normal.    Erythematous area on the left lower leg with a scab it is approximately 3 mm x 6 mm is not healing in this point there is a red area around it consistent with cellulitis       Assessment & Plan:  Antibiotic ointment along with Keflex for 7 days plus also do a Derm follow-up in 2 weeks may need wound referral if ongoing troubles or not healing properly

## 2019-06-11 DIAGNOSIS — T161XXA Foreign body in right ear, initial encounter: Secondary | ICD-10-CM | POA: Diagnosis not present

## 2019-06-11 DIAGNOSIS — T162XXA Foreign body in left ear, initial encounter: Secondary | ICD-10-CM | POA: Diagnosis not present

## 2019-06-28 ENCOUNTER — Ambulatory Visit (INDEPENDENT_AMBULATORY_CARE_PROVIDER_SITE_OTHER): Payer: Medicare Other | Admitting: Otolaryngology

## 2019-07-02 DIAGNOSIS — R9721 Rising PSA following treatment for malignant neoplasm of prostate: Secondary | ICD-10-CM | POA: Diagnosis not present

## 2019-07-02 DIAGNOSIS — N3946 Mixed incontinence: Secondary | ICD-10-CM | POA: Diagnosis not present

## 2019-07-02 DIAGNOSIS — R972 Elevated prostate specific antigen [PSA]: Secondary | ICD-10-CM | POA: Diagnosis not present

## 2019-07-02 DIAGNOSIS — N3040 Irradiation cystitis without hematuria: Secondary | ICD-10-CM | POA: Diagnosis not present

## 2019-07-02 DIAGNOSIS — N35014 Post-traumatic urethral stricture, male, unspecified: Secondary | ICD-10-CM | POA: Diagnosis not present

## 2019-07-02 DIAGNOSIS — C61 Malignant neoplasm of prostate: Secondary | ICD-10-CM | POA: Diagnosis not present

## 2019-07-04 DIAGNOSIS — L609 Nail disorder, unspecified: Secondary | ICD-10-CM | POA: Diagnosis not present

## 2019-07-04 DIAGNOSIS — M79671 Pain in right foot: Secondary | ICD-10-CM | POA: Diagnosis not present

## 2019-07-04 DIAGNOSIS — E1140 Type 2 diabetes mellitus with diabetic neuropathy, unspecified: Secondary | ICD-10-CM | POA: Diagnosis not present

## 2019-07-04 DIAGNOSIS — M79672 Pain in left foot: Secondary | ICD-10-CM | POA: Diagnosis not present

## 2019-07-05 ENCOUNTER — Other Ambulatory Visit: Payer: Self-pay

## 2019-07-05 DIAGNOSIS — R972 Elevated prostate specific antigen [PSA]: Secondary | ICD-10-CM | POA: Diagnosis not present

## 2019-07-11 ENCOUNTER — Other Ambulatory Visit: Payer: Self-pay | Admitting: Family Medicine

## 2019-07-12 ENCOUNTER — Other Ambulatory Visit: Payer: Self-pay | Admitting: Family Medicine

## 2019-07-16 ENCOUNTER — Telehealth: Payer: Self-pay | Admitting: Adult Health

## 2019-07-16 NOTE — Telephone Encounter (Signed)
His download indicates that he uses machine 29 out of 30 days for compliance of 97%.  He uses machine greater than 4 hours 22 days for compliance of 73%.  On average he uses his machine 5 hours and 39 minutes.  His residual AHI is 10.2 on 11 cm of water with EPR of 3.  His leak in the 95th percentile is 48.2 L/min.  His AHI has improved but his leak is still high.  Can you please inquire if the patient is amenable to having another mask refitting.

## 2019-07-16 NOTE — Telephone Encounter (Signed)
Please pull a CPAP download for my review. Thanks.

## 2019-07-17 NOTE — Addendum Note (Signed)
Addended by: Brandon Melnick on: 07/17/2019 09:40 AM   Modules accepted: Orders

## 2019-07-17 NOTE — Telephone Encounter (Signed)
I called and spoke to pt and wife.  I relayed that per cpap download results, still high leak, recommended per MM.NP a mask refitting if pt amenable.  They did agree.  I gave tele #, address of [facility].

## 2019-08-07 ENCOUNTER — Encounter: Payer: Self-pay | Admitting: Family Medicine

## 2019-08-07 ENCOUNTER — Other Ambulatory Visit: Payer: Self-pay

## 2019-08-07 ENCOUNTER — Ambulatory Visit (INDEPENDENT_AMBULATORY_CARE_PROVIDER_SITE_OTHER): Payer: Medicare Other | Admitting: Family Medicine

## 2019-08-07 VITALS — BP 122/78 | Temp 97.6°F | Wt 165.2 lb

## 2019-08-07 DIAGNOSIS — E1140 Type 2 diabetes mellitus with diabetic neuropathy, unspecified: Secondary | ICD-10-CM

## 2019-08-07 DIAGNOSIS — I10 Essential (primary) hypertension: Secondary | ICD-10-CM | POA: Diagnosis not present

## 2019-08-07 DIAGNOSIS — R6884 Jaw pain: Secondary | ICD-10-CM

## 2019-08-07 DIAGNOSIS — E7849 Other hyperlipidemia: Secondary | ICD-10-CM

## 2019-08-07 DIAGNOSIS — N289 Disorder of kidney and ureter, unspecified: Secondary | ICD-10-CM

## 2019-08-07 NOTE — Progress Notes (Signed)
   Subjective:    Patient ID: Luke Solis, male    DOB: 04-29-1931, 85 y.o.   MRN: [id number]  HPI  Patient arrives with left jaw pain for several weeks. Patient states it pops and cracks esp when yawning. He states he has never had this before he does relate how it pops a lot on him.  He gets intermittent pain with it.  He has not tried anything for it.  Has not altered his eating.  Does not wake him up at night no fever or chills associated PMH benign  He also relates his sugars have been running high he relates he is taking his medicine the way that he is supposed to-he is trying to follow somewhat of a healthy diet but he does state he takes in more sugar than he should Review of Systems  Constitutional: Negative for diaphoresis and fatigue.  HENT: Negative for congestion and rhinorrhea.   Respiratory: Negative for cough and shortness of breath.   Cardiovascular: Negative for chest pain and leg swelling.  Gastrointestinal: Negative for abdominal pain and diarrhea.  Skin: Negative for color change and rash.  Neurological: Negative for dizziness and headaches.  Psychiatric/Behavioral: Negative for behavioral problems and confusion.       Objective:   Physical Exam Vitals signs reviewed.  Cardiovascular:     Rate and Rhythm: Normal rate and regular rhythm.     Heart sounds: Normal heart sounds. No murmur.  Pulmonary:     Effort: Pulmonary effort is normal.     Breath sounds: Normal breath sounds.  Lymphadenopathy:     Cervical: No cervical adenopathy.  Neurological:     Mental Status: He is alert.  Psychiatric:        Behavior: Behavior normal.   On jaw exam there is no findings of any mass or tumors subjective discomfort in the right TMJ region        Assessment & Plan:  More than likely inflammation related to TMJ will go ahead with avoiding tough meats as well as foods that require a lot of chewing may use ice may use Tylenol follow-up if ongoing troubles  Poor  control of diabetes additional testing ordered await the results of this and discuss it further with him more than likely will have to be on insulin

## 2019-08-08 LAB — LIPID PANEL
Chol/HDL Ratio: 2.9 ratio (ref 0.0–5.0)
Cholesterol, Total: 156 mg/dL (ref 100–199)
HDL: 53 mg/dL (ref >39)
LDL Chol Calc (NIH): 81 mg/dL (ref 0–99)
Triglycerides: 126 mg/dL (ref 0–149)
VLDL Cholesterol Cal: 22 mg/dL (ref 5–40)

## 2019-08-08 LAB — HEMOGLOBIN A1C
Est. average glucose Bld gHb Est-mCnc: 237 mg/dL
Hgb A1c MFr Bld: 9.9 % — ABNORMAL HIGH (ref 4.8–5.6)

## 2019-08-08 LAB — BASIC METABOLIC PANEL
BUN/Creatinine Ratio: 17 (ref 10–24)
BUN: 25 mg/dL (ref 8–27)
CO2: 25 mmol/L (ref 20–29)
Calcium: 9.8 mg/dL (ref 8.6–10.2)
Chloride: 103 mmol/L (ref 96–106)
Creatinine, Ser: 1.49 mg/dL — ABNORMAL HIGH (ref 0.76–1.27)
GFR calc Af Amer: 48 mL/min/{1.73_m2} — ABNORMAL LOW (ref >59)
GFR calc non Af Amer: 42 mL/min/{1.73_m2} — ABNORMAL LOW (ref >59)
Glucose: 223 mg/dL — ABNORMAL HIGH (ref 65–99)
Potassium: 4.2 mmol/L (ref 3.5–5.2)
Sodium: 142 mmol/L (ref 134–144)

## 2019-08-08 LAB — HEPATIC FUNCTION PANEL
ALT: 13 IU/L (ref 0–44)
AST: 16 IU/L (ref 0–40)
Albumin: 4.4 g/dL (ref 3.6–4.6)
Alkaline Phosphatase: 70 IU/L (ref 39–117)
Bilirubin Total: 0.3 mg/dL (ref 0.0–1.2)
Bilirubin, Direct: 0.11 mg/dL (ref 0.00–0.40)
Total Protein: 6.8 g/dL (ref 6.0–8.5)

## 2019-08-08 LAB — MICROALBUMIN / CREATININE URINE RATIO
Creatinine, Urine: 223.6 mg/dL
Microalb/Creat Ratio: 72 mg/g creat — ABNORMAL HIGH (ref 0–29)
Microalbumin, Urine: 160.5 ug/mL

## 2019-08-09 ENCOUNTER — Other Ambulatory Visit: Payer: Self-pay | Admitting: Family Medicine

## 2019-08-10 ENCOUNTER — Encounter: Payer: Self-pay | Admitting: Family Medicine

## 2019-08-10 ENCOUNTER — Ambulatory Visit (INDEPENDENT_AMBULATORY_CARE_PROVIDER_SITE_OTHER): Payer: Medicare Other | Admitting: Family Medicine

## 2019-08-10 ENCOUNTER — Other Ambulatory Visit: Payer: Self-pay

## 2019-08-10 VITALS — Temp 97.6°F | Ht 74.0 in | Wt 165.0 lb

## 2019-08-10 DIAGNOSIS — E1165 Type 2 diabetes mellitus with hyperglycemia: Secondary | ICD-10-CM | POA: Diagnosis not present

## 2019-08-10 DIAGNOSIS — N4822 Cellulitis of corpus cavernosum and penis: Secondary | ICD-10-CM

## 2019-08-10 DIAGNOSIS — E1142 Type 2 diabetes mellitus with diabetic polyneuropathy: Secondary | ICD-10-CM | POA: Diagnosis not present

## 2019-08-10 MED ORDER — LANTUS SOLOSTAR 100 UNIT/ML ~~LOC~~ SOPN: PEN_INJECTOR | SUBCUTANEOUS | 0 refills | Status: DC

## 2019-08-10 NOTE — Progress Notes (Signed)
   Subjective:    Patient ID: Luke Solis, male    DOB: 1931/11/14, 85 y.o.   MRN: [id number]  HPI  Patient arrives with swollen pennis for a few days. Patient relates he has had swelling in the penis for the past several days denies high fever chills denies sweats states nothing is seem to have triggered this.  His sugars have been running high  We did discuss his lab work including elevated A1c at 9.9 the patient understands he needs to get it under better control also because of his kidney function he should no longer be on the higher dose of the metformin reduce it to 500 mg  We did discuss dietary measures including avoiding simple sugars such as ice cream  Review of Systems  Constitutional: Negative for activity change.  HENT: Negative for congestion and rhinorrhea.   Respiratory: Negative for cough and shortness of breath.   Cardiovascular: Negative for chest pain.  Gastrointestinal: Negative for abdominal pain, diarrhea, nausea and vomiting.  Endocrine: Positive for polyuria. Negative for polyphagia.  Genitourinary: Negative for dysuria and hematuria.  Neurological: Negative for weakness and headaches.  Psychiatric/Behavioral: Negative for behavioral problems and confusion.   Patient does relate discomfort toward the end of his penis redness    Objective:   Physical Exam On physical exam the scrotum looks fine no redness no swelling the distal penis is red minimal tenderness he also has some whitish sebaceum around the head of the penis lungs are clear respiratory rate normal heart regular no murmurs skin warm dry       Assessment & Plan:  I believe this is some mild cellulitis along with yeast I would recommend ketoconazole cream apply twice daily to the end of the penis also recommend short course of antibiotics  Diabetes poor control start Lantus 40 units each evening will need to make adjustments based upon sugar readings to get this under better control  Reduce the  metformin to 1 tablet daily  Follow-up several weeks  We will do follow-up lab work in the near future in approximately 3 months 15 minutes was spent with patient today discussing healthcare issues which they came.  More than 50% of this visit-total duration of visit-was spent in counseling and coordination of care.  Please see diagnosis regarding the focus of this coordination and care

## 2019-08-11 DIAGNOSIS — E1165 Type 2 diabetes mellitus with hyperglycemia: Secondary | ICD-10-CM | POA: Insufficient documentation

## 2019-08-11 DIAGNOSIS — E1142 Type 2 diabetes mellitus with diabetic polyneuropathy: Secondary | ICD-10-CM | POA: Insufficient documentation

## 2019-08-14 ENCOUNTER — Encounter: Payer: Self-pay | Admitting: *Deleted

## 2019-08-14 ENCOUNTER — Telehealth: Payer: Self-pay | Admitting: Family Medicine

## 2019-08-14 MED ORDER — METFORMIN HCL 500 MG PO TABS: ORAL_TABLET | ORAL | 1 refills | Status: DC

## 2019-08-14 NOTE — Progress Notes (Signed)
done

## 2019-08-14 NOTE — Telephone Encounter (Signed)
Have the patient check his sugar before bedtime tonight and again tomorrow morning Then have family call us with those numbers and then based upon this we can decide regarding the insulin thank you

## 2019-08-14 NOTE — Telephone Encounter (Signed)
Spoke with wife(DPR) and advised her Dr Nicki Reaper stated :   Have the patient check his sugar before bedtime tonight and again tomorrow morning Then have family call us with those numbers and then based upon this we can decide regarding the insulin thank you     Wife verbalized understanding and will call in am with the patient's readings.

## 2019-08-14 NOTE — Telephone Encounter (Signed)
Was suppose to go on insulin and someone was coming in to show him how to use it but states he did not understand directions. Was going to start today. Blood sugar on Sunday was 197, Monday 149 and today 145. Has cut metformin down to one daily even though he has not started insulin. Now since numbers are down wife is wondering if he should start insulin and if dr Nicki Reaper does want him to she wants to bring him in office so he can be shown how to use it.

## 2019-08-14 NOTE — Telephone Encounter (Signed)
Patient wanting to if he needs to be put on insulin or not his sugar reading were 197 on 9/6 in the am and today was 145 in the am. Please advise

## 2019-08-15 ENCOUNTER — Telehealth: Payer: Self-pay | Admitting: Family Medicine

## 2019-08-15 NOTE — Telephone Encounter (Signed)
Pt's wife called to give blood sugar readings that Dr. Nicki Reaper requested  Last night (after dinner) @ 10:30pm - 171  This morning, fasting @ 9:30am - 148

## 2019-08-15 NOTE — Telephone Encounter (Signed)
Discussed with pt. Pt verbalized understanding.  °

## 2019-08-15 NOTE — Telephone Encounter (Signed)
For now let us allow the patient to work hard on healthy eating Hold off on insulin currently  I recommend checking sugars twice daily He should check every morning On some days check before supper on other days before bedtime  Patient should keep a written log regarding the readings It would be beneficial to send the readings to [place] in 2 weeks

## 2019-08-27 ENCOUNTER — Other Ambulatory Visit: Payer: Self-pay

## 2019-08-27 ENCOUNTER — Ambulatory Visit (INDEPENDENT_AMBULATORY_CARE_PROVIDER_SITE_OTHER): Payer: Medicare Other | Admitting: Family Medicine

## 2019-08-27 ENCOUNTER — Encounter: Payer: Self-pay | Admitting: Family Medicine

## 2019-08-27 VITALS — BP 114/76 | Temp 97.3°F | Wt 166.2 lb

## 2019-08-27 DIAGNOSIS — E1142 Type 2 diabetes mellitus with diabetic polyneuropathy: Secondary | ICD-10-CM

## 2019-08-27 DIAGNOSIS — N289 Disorder of kidney and ureter, unspecified: Secondary | ICD-10-CM | POA: Diagnosis not present

## 2019-08-27 DIAGNOSIS — E1165 Type 2 diabetes mellitus with hyperglycemia: Secondary | ICD-10-CM

## 2019-08-27 NOTE — Progress Notes (Signed)
   Subjective:    Patient ID: Luke Solis, male    DOB: July 02, 1931, 85 y.o.   MRN: [id number]  HPI  Patient arrives for follow up on sugar readings. Patient has not started the insulin yet. Patient states reading are up and down an if he needs to start insulin he brought it with him for the doctor to show him how it works. Patient glucose numbers primarily are in the 1 50-1 80s in the morning occasionally checks it later in the day is in the 200s he is trying to watch his diet he is only taking 1 metformin a day because his creatinine clearance is low his A1c is not good control at 9.9 PMH diabetes Review of Systems  Constitutional: Negative for activity change.  HENT: Negative for congestion and rhinorrhea.   Respiratory: Negative for cough and shortness of breath.   Cardiovascular: Negative for chest pain.  Gastrointestinal: Negative for abdominal pain, diarrhea, nausea and vomiting.  Genitourinary: Negative for dysuria and hematuria.  Neurological: Negative for weakness and headaches.  Psychiatric/Behavioral: Negative for behavioral problems and confusion.       Objective:   Physical Exam Vitals signs reviewed.  Constitutional:      General: He is not in acute distress. HENT:     Head: Normocephalic and atraumatic.  Eyes:     General:        Right eye: No discharge.        Left eye: No discharge.  Neck:     Trachea: No tracheal deviation.  Cardiovascular:     Rate and Rhythm: Normal rate and regular rhythm.     Heart sounds: Normal heart sounds. No murmur.  Pulmonary:     Effort: Pulmonary effort is normal. No respiratory distress.     Breath sounds: Normal breath sounds.  Lymphadenopathy:     Cervical: No cervical adenopathy.  Skin:    General: Skin is warm and dry.  Neurological:     Mental Status: He is alert.     Coordination: Coordination normal.  Psychiatric:        Behavior: Behavior normal.     15 minutes was spent with patient today discussing healthcare  issues which they came.  More than 50% of this visit-total duration of visit-was spent in counseling and coordination of care.  Please see diagnosis regarding the focus of this coordination and care Along with diabetic education today on how to use shot and how to watch for low sugars      Assessment & Plan:  Diabetes not good control Initiate Lantus 4 units each evening Send [place] readings next week Watch diet closely stay physically active We will do a follow-up visit in 3 months virtually

## 2019-09-03 IMAGING — DX DG CHEST 2V
2 series · 2 of 2 positions shown · non-contrast
Comparison: Chest x-ray dated April 21, 2015.

CLINICAL DATA: Altered mental status and weakness.

EXAM:
CHEST - 2 VIEW

[chest pa]
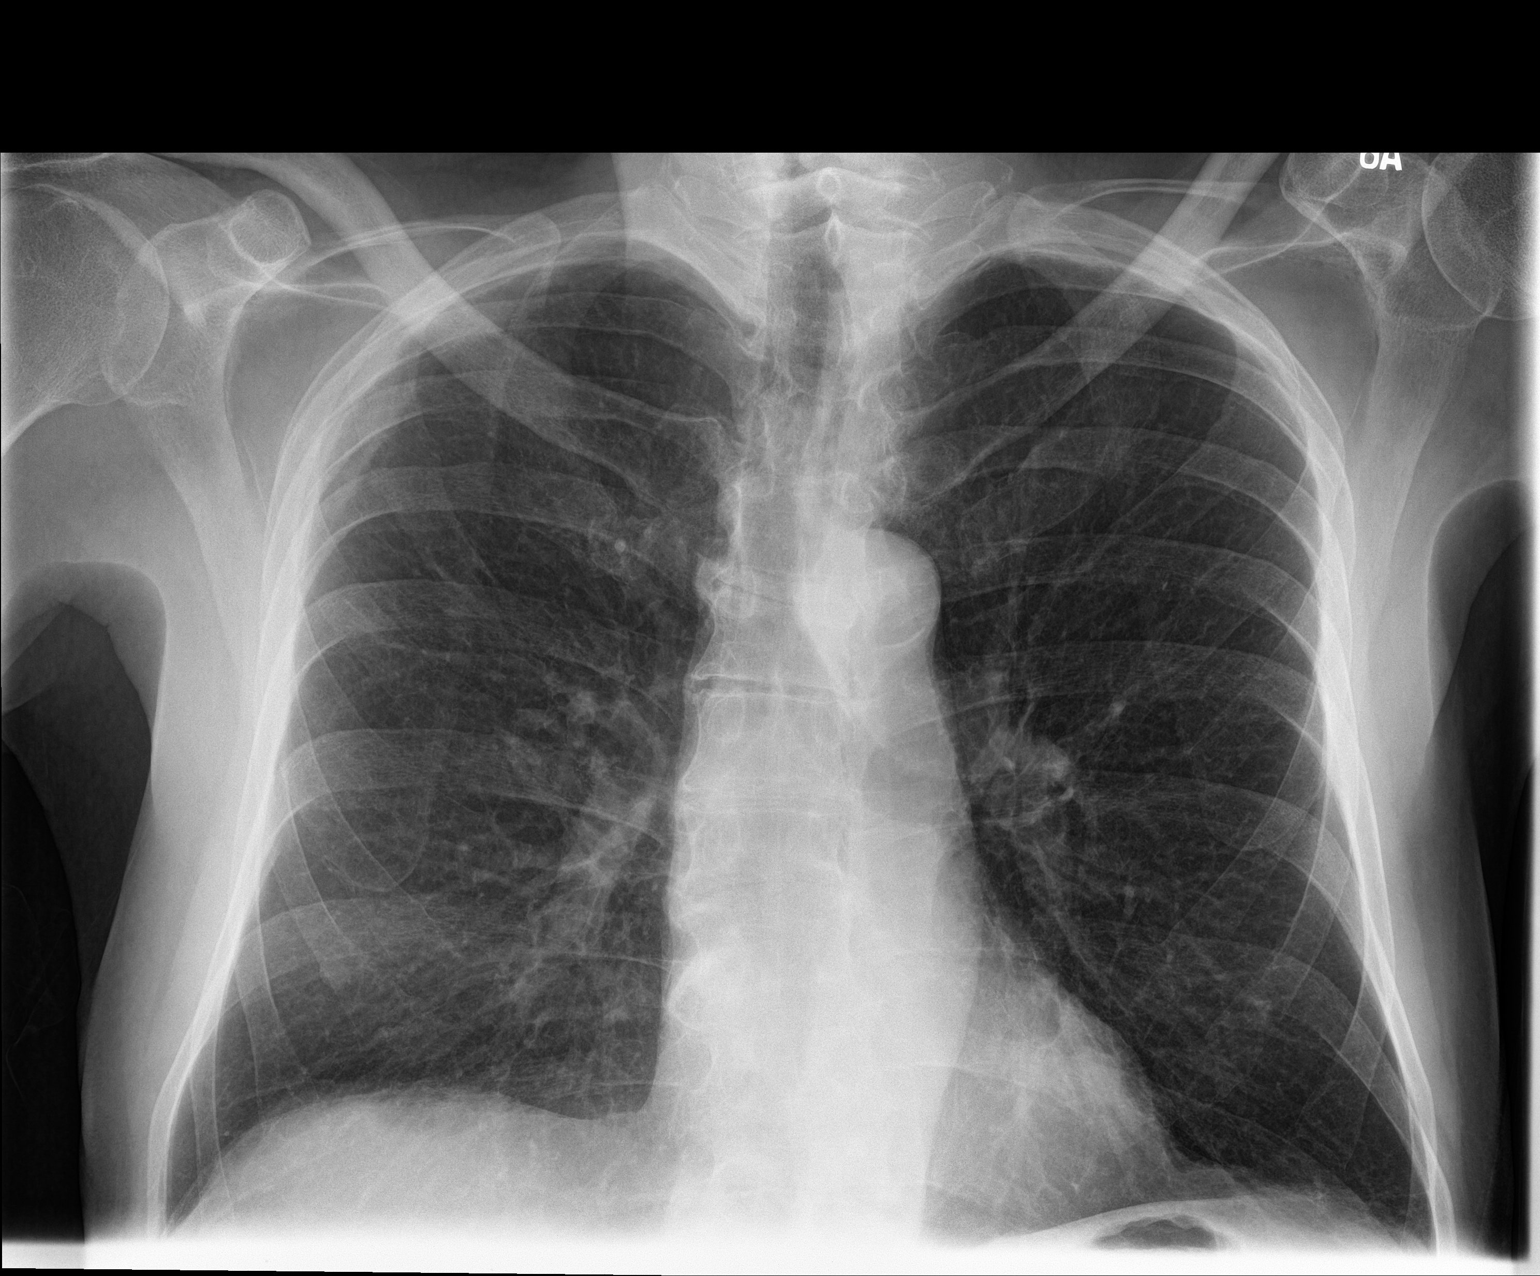

[chest lat]
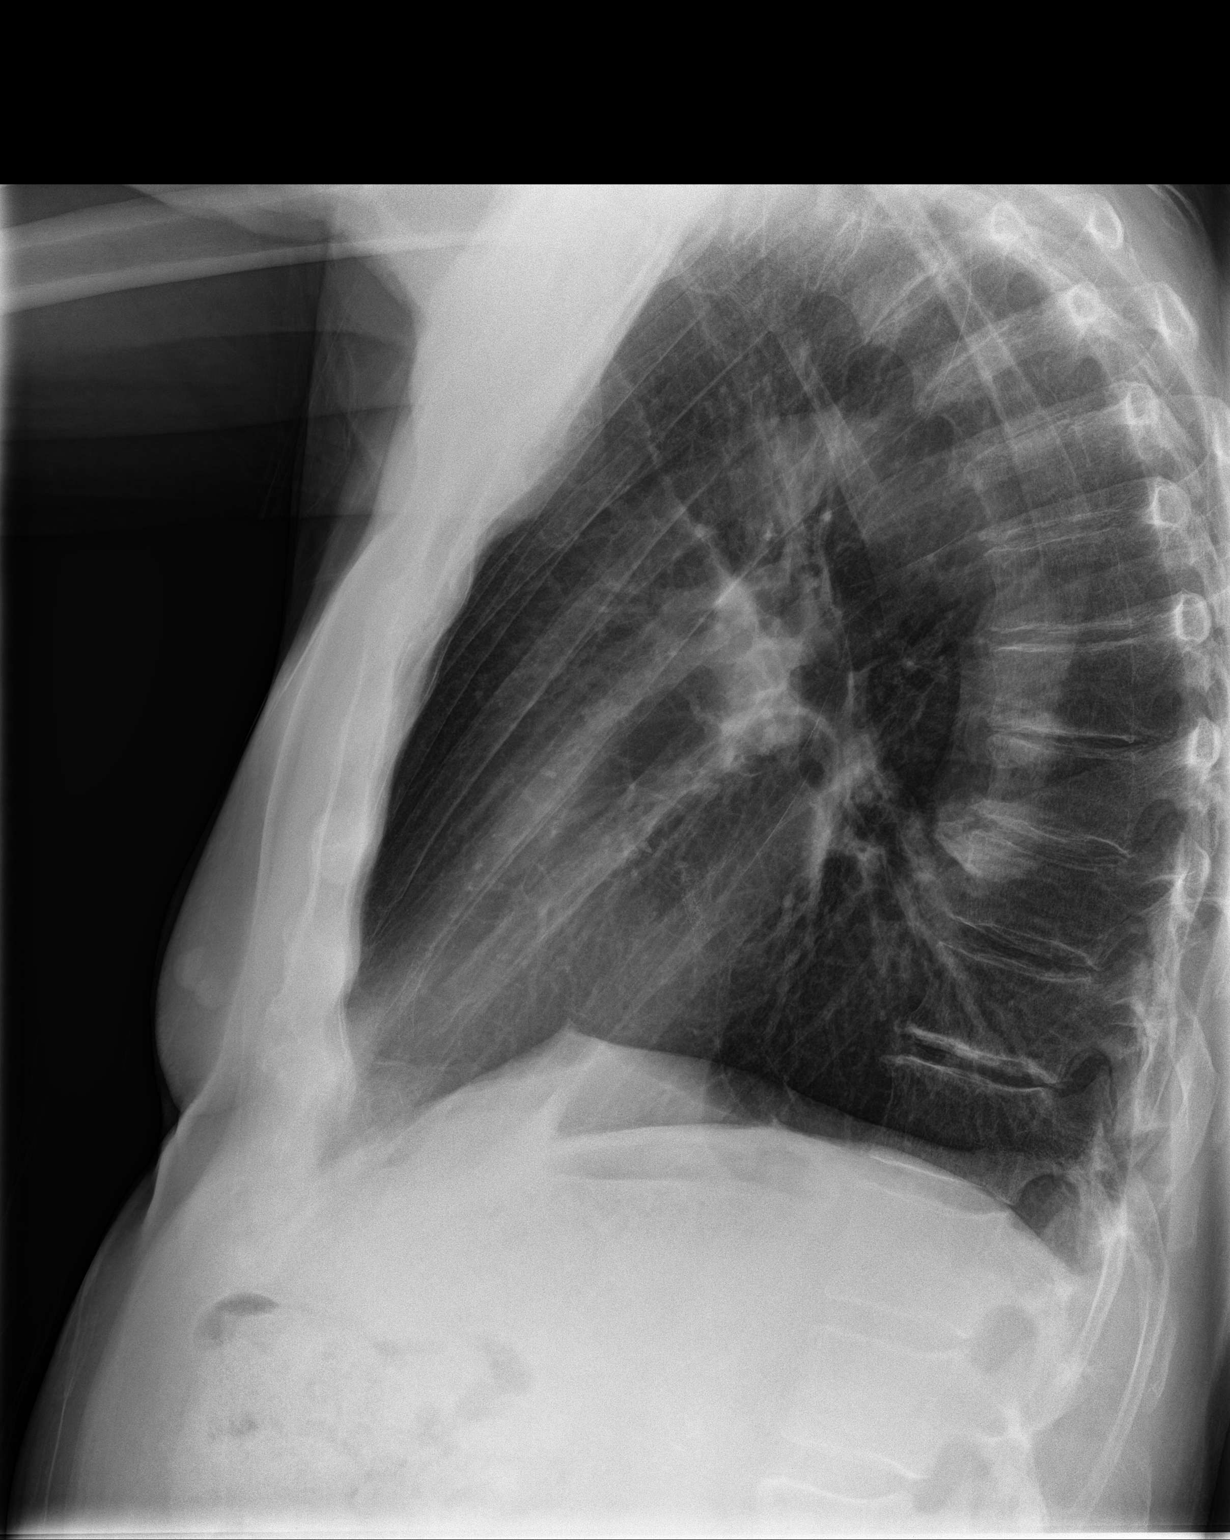

[2 of 2 positions shown; findings below may reference images not displayed]

FINDINGS: The heart size and mediastinal contours are within normal limits.
Normal pulmonary vascularity. Atherosclerotic calcification of the
aortic arch. No focal consolidation, pleural effusion, or
pneumothorax. Bilateral nipple shadows again noted. No acute osseous
abnormality.
IMPRESSION: No active cardiopulmonary disease.

## 2019-09-04 ENCOUNTER — Other Ambulatory Visit: Payer: Self-pay

## 2019-09-04 ENCOUNTER — Other Ambulatory Visit: Payer: Self-pay | Admitting: Family Medicine

## 2019-09-04 ENCOUNTER — Encounter: Payer: Self-pay | Admitting: Cardiology

## 2019-09-04 ENCOUNTER — Ambulatory Visit (INDEPENDENT_AMBULATORY_CARE_PROVIDER_SITE_OTHER): Payer: Medicare Other | Admitting: Cardiology

## 2019-09-04 VITALS — BP 140/72 | HR 68 | Temp 78.0°F | Ht 74.0 in | Wt 162.0 lb

## 2019-09-04 DIAGNOSIS — I25119 Atherosclerotic heart disease of native coronary artery with unspecified angina pectoris: Secondary | ICD-10-CM

## 2019-09-04 DIAGNOSIS — I10 Essential (primary) hypertension: Secondary | ICD-10-CM

## 2019-09-04 DIAGNOSIS — E782 Mixed hyperlipidemia: Secondary | ICD-10-CM | POA: Diagnosis not present

## 2019-09-04 NOTE — Progress Notes (Signed)
Cardiology Office Note  Date: 09/04/2019   ID: Luke Solis, DOB 1931-10-22, MRN [id number]  PCP:  Kathyrn Drown, MD  Cardiologist:  Rozann Lesches, MD Electrophysiologist:  None   Chief Complaint  Patient presents with  . Cardiac follow-up    History of Present Illness: Luke Solis is an 85 y.o. male last seen in March 2017.  He presents overdue for follow-up.  I reviewed interval records, he continues to follow regularly with Dr. [name].  From a cardiac perspective, he does not report any obvious angina symptoms or nitroglycerin use.  He describes NYHA class II dyspnea with typical activities, no palpitations or syncope.  I reviewed his medications which are outlined below.  He is also taking a baby aspirin daily.  Most recent lipid panel from earlier this month showed LDL 81.  He just recently started on insulin per Dr. [name].  I personally reviewed his ECG today which shows sinus rhythm with prolonged PR interval, left anterior fascicular block and decreased R wave progression.  Past Medical History:  Diagnosis Date  . Coronary atherosclerosis of native coronary artery    BMS proximal and mid RCA as well as BMS circumflex - 2002 (had residual 70% distal LAD), LVEF 60%  . Diabetes mellitus without complication ([place])   . Essential hypertension   . Hyperlipidemia   . Neuropathy   . NSTEMI (non-ST elevated myocardial infarction) ([place])    2002  . Prostate cancer ([place])   . Sleep apnea    On CPAP  . Stroke [facility])     Past Surgical History:  Procedure Laterality Date  . COLONOSCOPY    . INGUINAL HERNIA REPAIR    . KNEE SURGERY    . NOSE SURGERY    . PROSTATE SURGERY    . TONSILLECTOMY      Current Outpatient Medications  Medication Sig Dispense Refill  . amLODipine (NORVASC) 2.5 MG tablet TAKE 1 TABLET ONCE DAILY. 30 tablet 2  . Calcium Carbonate-Vitamin D 600-400 MG-UNIT per tablet Take 1 tablet by mouth daily.    . fexofenadine (ALLEGRA) 180 MG tablet TAKE 1  TABLET BY MOUTH ONCE DAILY AS NEEDED FOR ALLERGIES. 30 tablet 11  . FORACARE PREMIUM V10 TEST test strip USE AS DIRECTED. 50 each 0  . Insulin Glargine (LANTUS SOLOSTAR) 100 UNIT/ML Solostar Pen 4 units every evening. May titrate up to 20 units 5 pen 0  . metFORMIN (GLUCOPHAGE) 500 MG tablet Take one tablet once daily 90 tablet 1  . Multiple Vitamin (MULTIVITAMIN) tablet Take 1 tablet by mouth daily.    . mupirocin ointment (BACTROBAN) 2 % Apply thin amount daily 22 g 0  . nitroGLYCERIN (NITROSTAT) 0.4 MG SL tablet PLACE ONE (1) TABLET UNDER TONGUE EVERY 5 MINUTES UP TO (3) DOSES AS NEEDED FOR CHEST PAIN. 25 tablet 2  . pregabalin (LYRICA) 100 MG capsule Take 1 capsule (100 mg total) by mouth 2 (two) times daily. TAKE 1 CAPSULE IN THE MORNING AND 2 CAPSULES AT BEDTIME. 90 capsule 5  . rosuvastatin (CRESTOR) 40 MG tablet TAKE 1 TABLET ONCE DAILY. 30 tablet 2   No current facility-administered medications for this visit.    Allergies:  Broccoli [brassica oleracea italica] and Celery oil   Social History: The patient  reports that he has never smoked. He has never used smokeless tobacco. He reports that he does not drink alcohol or use drugs.   ROS:  Please see the history of present illness. Otherwise, complete  review of systems is positive for none.  All other systems are reviewed and negative.   Physical Exam: VS:  BP 140/72   Pulse 68   Temp (!) 78 F (25.6 C)   Ht 6\' 2"  (1.88 m)   Wt 162 lb (73.5 kg)   SpO2 98%   BMI 20.80 kg/m , BMI Body mass index is 20.8 kg/m.  Wt Readings from Last 3 Encounters:  09/04/19 162 lb (73.5 kg)  08/27/19 166 lb 3.2 oz (75.4 kg)  08/10/19 165 lb (74.8 kg)    General: Elderly male, patient appears comfortable at rest. HEENT: Conjunctiva and lids normal, wearing a mask. Neck: Supple, no elevated JVP or carotid bruits, no thyromegaly. Lungs: Clear to auscultation, nonlabored breathing at rest. Cardiac: Regular rate and rhythm, no S3, soft systolic  murmur. Abdomen: Soft, nontender, bowel sounds present. Extremities: No pitting edema, distal pulses 2+. Skin: Warm and dry. Musculoskeletal: Kyphosis. Neuropsychiatric: Alert and oriented x3, affect grossly appropriate.  ECG:  An ECG dated 03/01/2018 was personally reviewed today and demonstrated:  Sinus rhythm with left anterior fascicular block versus old inferior infarct pattern, decreased R wave progression.  Recent Labwork: 12/22/2018: Hemoglobin 12.8; Platelets 209 08/07/2019: ALT 13; AST 16; BUN 25; Creatinine, Ser 1.49; Potassium 4.2; Sodium 142     Component Value Date/Time   CHOL 156 08/07/2019 1504   TRIG 126 08/07/2019 1504   HDL 53 08/07/2019 1504   CHOLHDL 2.9 08/07/2019 1504   CHOLHDL 2.8 03/02/2018 0543   VLDL 13 03/02/2018 0543   LDLCALC 81 08/07/2019 1504    Other Studies Reviewed Today:  Exercise Cardiolite 05/22/2015: No diagnostic ST segment changes, fixed small defect of mild intensity in the basal inferoseptal and mid to basal inferior wall with LVEF greater than 65%. Overall low risk. Patient did achieve 85% MPHR.  Echocardiogram 03/01/2018: Study Conclusions  - Left ventricle: The cavity size was normal. Wall thickness was   increased in a pattern of moderate LVH. Systolic function was   normal. The estimated ejection fraction was in the range of 60%   to 65%. Wall motion was normal; there were no regional wall   motion abnormalities. Doppler parameters are consistent with   abnormal left ventricular relaxation (grade 1 diastolic   dysfunction). - Aortic valve: Mildly calcified annulus. Trileaflet; mildly   thickened leaflets. Valve area (VTI): 2.98 cm^2. Valve area   (Vmax): 3.12 cm^2. Valve area (Vmean): 2.63 cm^2. - Mitral valve: Mildly calcified annulus. Mildly thickened leaflets  Cardiac monitor 03/15/2018: Available strips from 14-day event recorder reviewed.  Limited strips were presented, both showing sinus rhythm.  No obvious arrhythmias were  reported, no pauses.  Heart rate ranged from 56 bpm up to 107 bpm with average heart rate 75 bpm.  Assessment and Plan:  1.  CAD status post BMS to the RCA and circumflex with moderate LAD disease.  We are continuing medical therapy and observation in the absence of progressive angina symptoms.  He remains on aspirin and statin therapy, no recent nitroglycerin use.  Follow-up ECG reviewed and stable.  2.  Mixed hyperlipidemia, he is on Crestor with recent LDL 81.  3.  Essential hypertension, systolic is in the 952W today.  Keep follow-up with Dr. [name].  Medication Adjustments/Labs and Tests Ordered: Current medicines are reviewed at length with the patient today.  Concerns regarding medicines are outlined above.   Tests Ordered: Orders Placed This Encounter  Procedures  . EKG 12-Lead    Medication Changes: No  orders of the defined types were placed in this encounter.   Disposition:  Follow up 1 year in the [place] office.  Signed, Satira Sark, MD, [facility] 09/04/2019 10:58 AM    [place] [place] [facility] [place]. [address], [place], [place] [address] Phone: [phone]; Fax: [phone]

## 2019-09-04 NOTE — Patient Instructions (Signed)
Medication Instructions:  Your physician recommends that you continue on your current medications as directed. Please refer to the Current Medication list given to you today.  Labwork: None today  Procedures/Testing: None today  Follow-Up: 1 year with Dr.McDowell  Any Additional Special Instructions Will Be Listed Below (If Applicable).     If you need a refill on your cardiac medications before your next appointment, please call your pharmacy.    Thank you for choosing [facility] !        

## 2019-09-12 DIAGNOSIS — L609 Nail disorder, unspecified: Secondary | ICD-10-CM | POA: Diagnosis not present

## 2019-09-12 DIAGNOSIS — M79672 Pain in left foot: Secondary | ICD-10-CM | POA: Diagnosis not present

## 2019-09-12 DIAGNOSIS — E1140 Type 2 diabetes mellitus with diabetic neuropathy, unspecified: Secondary | ICD-10-CM | POA: Diagnosis not present

## 2019-09-12 DIAGNOSIS — M79671 Pain in right foot: Secondary | ICD-10-CM | POA: Diagnosis not present

## 2019-10-03 DIAGNOSIS — R972 Elevated prostate specific antigen [PSA]: Secondary | ICD-10-CM | POA: Diagnosis not present

## 2019-10-08 ENCOUNTER — Other Ambulatory Visit: Payer: Self-pay | Admitting: Family Medicine

## 2019-10-09 NOTE — Telephone Encounter (Signed)
May have this and 4 refills 

## 2019-10-15 ENCOUNTER — Telehealth: Payer: Self-pay | Admitting: Family Medicine

## 2019-10-15 NOTE — Telephone Encounter (Signed)
Pt dropped off blood glucose readings. Placed in providers box in office.

## 2019-10-16 ENCOUNTER — Other Ambulatory Visit: Payer: Self-pay

## 2019-10-16 ENCOUNTER — Other Ambulatory Visit (INDEPENDENT_AMBULATORY_CARE_PROVIDER_SITE_OTHER): Payer: Medicare Other | Admitting: *Deleted

## 2019-10-16 DIAGNOSIS — Z23 Encounter for immunization: Secondary | ICD-10-CM | POA: Diagnosis not present

## 2019-10-17 NOTE — Telephone Encounter (Signed)
Nurses I reviewed over the glucose readings Morning readings are fairly good but still higher than what I would like to see on most days Later in the day some of his readings are significantly elevated  Please touch base with patient Is patient currently doing 4 units of insulin each evening?  Or is he just maintaining oral medicines watching diet? Does patient feel that he is doing a better job of being careful with dietary choices? Please send back to me for further instructions thank you

## 2019-10-17 NOTE — Telephone Encounter (Signed)
I would encourage patient to improve his diet some Also recommend bumping up insulin to 6 units Send [place] more readings again in a few weeks time If any low sugar spells to notify us

## 2019-10-17 NOTE — Telephone Encounter (Signed)
Tried to call no answer

## 2019-10-17 NOTE — Telephone Encounter (Signed)
Pt states he is taking 4 units of insulin and his oral meds but not doing well with diet

## 2019-10-22 NOTE — Telephone Encounter (Signed)
Telephone call no answer 

## 2019-10-23 NOTE — Telephone Encounter (Signed)
Wife(DPR) advised Dr Nicki Reaper would encourage patient to improve his diet some Also recommend bumping up insulin to 6 units Send [place] more readings again in a few weeks time If any low sugar spells to notify us Wife verbalized understanding.

## 2019-10-31 ENCOUNTER — Other Ambulatory Visit: Payer: Self-pay | Admitting: Family Medicine

## 2019-11-26 ENCOUNTER — Ambulatory Visit (INDEPENDENT_AMBULATORY_CARE_PROVIDER_SITE_OTHER): Payer: Medicare Other | Admitting: Family Medicine

## 2019-11-26 ENCOUNTER — Other Ambulatory Visit: Payer: Self-pay

## 2019-11-26 DIAGNOSIS — N289 Disorder of kidney and ureter, unspecified: Secondary | ICD-10-CM

## 2019-11-26 DIAGNOSIS — E1165 Type 2 diabetes mellitus with hyperglycemia: Secondary | ICD-10-CM | POA: Diagnosis not present

## 2019-11-26 DIAGNOSIS — E1142 Type 2 diabetes mellitus with diabetic polyneuropathy: Secondary | ICD-10-CM

## 2019-11-26 MED ORDER — LANTUS SOLOSTAR 100 UNIT/ML ~~LOC~~ SOPN: PEN_INJECTOR | SUBCUTANEOUS | 0 refills | Status: DC

## 2019-11-26 MED ORDER — ROSUVASTATIN CALCIUM 40 MG PO TABS: 40.0000 mg | ORAL_TABLET | Freq: Every day | ORAL | 5 refills | Status: DC

## 2019-11-26 MED ORDER — METFORMIN HCL 500 MG PO TABS: ORAL_TABLET | ORAL | 5 refills | Status: DC

## 2019-11-26 MED ORDER — AMLODIPINE BESYLATE 2.5 MG PO TABS: 2.5000 mg | ORAL_TABLET | Freq: Every day | ORAL | 6 refills | Status: DC

## 2019-11-26 NOTE — Progress Notes (Signed)
   Subjective:    Patient ID: Luke Solis, male    DOB: [birth year], 85 y.o.   MRN: [id number]  Diabetes He presents for his follow-up diabetic visit. He has type 2 diabetes mellitus. Pertinent negatives for hypoglycemia include no confusion, dizziness or headaches. (HA in back of head going to right side of spine; several occasions, noticed about 2 weeks ago ) There are no diabetic associated symptoms. Pertinent negatives for diabetes include no chest pain and no fatigue. There are no hypoglycemic complications. There are no diabetic complications. Eye exam is current.  Patient states sugars been doing well currently on 6 units of insulin denies any low sugar spells Has intermittent pain in left side and right side of his neck and sometimes radiates up to the back of the head does not radiate down the arms does not wake him up at night mild arthralgic issues takes Tylenol as needed Virtual Visit via Telephone Note  I connected with Luke Solis on 11/26/19 at 10:00 AM EST by telephone and verified that I am speaking with the correct person using two identifiers.  Location: Patient: home Provider: office   I discussed the limitations, risks, security and privacy concerns of performing an evaluation and management service by telephone and the availability of in person appointments. I also discussed with the patient that there may be a patient responsible charge related to this service. The patient expressed understanding and agreed to proceed.   History of Present Illness:    Observations/Objective:   Assessment and Plan:   Follow Up Instructions:    I discussed the assessment and treatment plan with the patient. The patient was provided an opportunity to ask questions and all were answered. The patient agreed with the plan and demonstrated an understanding of the instructions.   The patient was advised to call back or seek an in-person evaluation if the symptoms worsen or if the  condition fails to improve as anticipated.  I provided 16 minutes of non-face-to-face time during this encounter.   Vicente Males, LPN    Review of Systems  Constitutional: Negative for diaphoresis and fatigue.  HENT: Negative for congestion and rhinorrhea.   Respiratory: Negative for cough and shortness of breath.   Cardiovascular: Negative for chest pain and leg swelling.  Gastrointestinal: Negative for abdominal pain and diarrhea.  Skin: Negative for color change and rash.  Neurological: Negative for dizziness and headaches.  Psychiatric/Behavioral: Negative for behavioral problems and confusion.   Actually positive for more cervical discomfort that radiates to the back of the head    Objective:   Physical Exam  Today's visit was via telephone Physical exam was not possible for this visit       Assessment & Plan:  Cervical pain more than likely arthritic recommend Tylenol as needed follow-up if progressive troubles  Diabetes good control currently.  Lab work in the spring with follow-up office visit

## 2019-12-11 ENCOUNTER — Telehealth: Payer: Self-pay | Admitting: Family Medicine

## 2019-12-11 NOTE — Telephone Encounter (Signed)
Pt contacted and verbalized understanding. Pt transferred up front to set up office visit

## 2019-12-11 NOTE — Telephone Encounter (Signed)
I rec OV as next step within next 7 days( reassure family low flow in office no sick people seen in office)

## 2019-12-11 NOTE — Telephone Encounter (Signed)
Patient states still having headache no better. Please advise

## 2019-12-11 NOTE — Telephone Encounter (Signed)
Pt states that the headache starts at top of skull and goes to spine and around right ear. Pain is off and on. Pt has been taking Tylenol about once a day. Pt states the headaches started about a month. No issues with vision, no nausea, no vomiting. Please advise. Thank you  [facility] pharmacy

## 2019-12-13 ENCOUNTER — Other Ambulatory Visit: Payer: Self-pay

## 2019-12-13 ENCOUNTER — Ambulatory Visit (INDEPENDENT_AMBULATORY_CARE_PROVIDER_SITE_OTHER): Payer: Medicare Other | Admitting: Family Medicine

## 2019-12-13 ENCOUNTER — Encounter: Payer: Self-pay | Admitting: Family Medicine

## 2019-12-13 VITALS — BP 128/80 | Temp 98.5°F | Wt 167.2 lb

## 2019-12-13 DIAGNOSIS — E1142 Type 2 diabetes mellitus with diabetic polyneuropathy: Secondary | ICD-10-CM | POA: Diagnosis not present

## 2019-12-13 DIAGNOSIS — N289 Disorder of kidney and ureter, unspecified: Secondary | ICD-10-CM | POA: Diagnosis not present

## 2019-12-13 DIAGNOSIS — R519 Headache, unspecified: Secondary | ICD-10-CM | POA: Diagnosis not present

## 2019-12-13 DIAGNOSIS — E1165 Type 2 diabetes mellitus with hyperglycemia: Secondary | ICD-10-CM | POA: Diagnosis not present

## 2019-12-13 DIAGNOSIS — M542 Cervicalgia: Secondary | ICD-10-CM

## 2019-12-13 NOTE — Progress Notes (Signed)
   Subjective:    Patient ID: Luke Solis, male    DOB: September 02, 1931, 86 y.o.   MRN: [id number]  HPI  Patient arrives to discuss headaches off and on for a month. Patient doesn't have a headache today. Patient states does not have a history of headache.  This is a new onset to a frequent headache.  States it started as pain in the occipital region on the right side.  He states he last anywhere from 4 to 5 minutes at a time.  No happen multiple times per day.  Does happen at times at night and will wake him up.  No nausea with it.  No double vision.  Denies any unilateral numbness weakness.  States he has tried Tylenol has tried repositioning no success.  As he has gotten older he has some significant kyphosis of his neck. Patient states blood pressure been under good control he is trying to eat relatively healthy. He does state he gets occasional sharp pain across the right side of his head but this is not frequent.  Denies any frontal headache denies temporal tenderness.  Review of Systems  Constitutional: Negative for activity change.  HENT: Negative for congestion and rhinorrhea.   Respiratory: Negative for cough and shortness of breath.   Cardiovascular: Negative for chest pain.  Gastrointestinal: Negative for abdominal pain, diarrhea, nausea and vomiting.  Genitourinary: Negative for dysuria and hematuria.  Neurological: Positive for headaches. Negative for weakness.  Psychiatric/Behavioral: Negative for behavioral problems and confusion.       Objective:   Physical Exam Vitals reviewed.  Constitutional:      General: He is not in acute distress. HENT:     Head: Normocephalic and atraumatic.  Eyes:     General:        Right eye: No discharge.        Left eye: No discharge.  Neck:     Trachea: No tracheal deviation.  Cardiovascular:     Rate and Rhythm: Normal rate and regular rhythm.     Heart sounds: Normal heart sounds. No murmur.  Pulmonary:     Effort: Pulmonary effort  is normal. No respiratory distress.     Breath sounds: Normal breath sounds.  Lymphadenopathy:     Cervical: No cervical adenopathy.  Skin:    General: Skin is warm and dry.  Neurological:     Mental Status: He is alert.     Coordination: Coordination normal.  Psychiatric:        Behavior: Behavior normal.    Lungs clear respiratory rate normal heart regular no murmurs extremities no edema skin warm dry patient does have kyphotic changes of the neck Patient also has subjective discomfort in the right occiput region.  No tenderness on palpation no abnormality on palpation       Assessment & Plan:  Cervical pain recommend x-rays await the results more than likely some arthritic changes  Right occiput headaches fairly frequent new onset over the past month both day and night They do wake him up at night I recommend going ahead with MRI to help rule out the possibility of a brain tumor If this is negative may need to get in with neurology for occipital nerve injections

## 2019-12-14 ENCOUNTER — Encounter: Payer: Self-pay | Admitting: *Deleted

## 2019-12-14 ENCOUNTER — Other Ambulatory Visit: Payer: Self-pay | Admitting: *Deleted

## 2019-12-14 LAB — BASIC METABOLIC PANEL
BUN/Creatinine Ratio: 17 (ref 10–24)
BUN: 22 mg/dL (ref 8–27)
CO2: 27 mmol/L (ref 20–29)
Calcium: 9.1 mg/dL (ref 8.6–10.2)
Chloride: 103 mmol/L (ref 96–106)
Creatinine, Ser: 1.32 mg/dL — ABNORMAL HIGH (ref 0.76–1.27)
GFR calc Af Amer: 56 mL/min/{1.73_m2} — ABNORMAL LOW (ref >59)
GFR calc non Af Amer: 49 mL/min/{1.73_m2} — ABNORMAL LOW (ref >59)
Glucose: 324 mg/dL — ABNORMAL HIGH (ref 65–99)
Potassium: 4 mmol/L (ref 3.5–5.2)
Sodium: 142 mmol/L (ref 134–144)

## 2019-12-14 LAB — HEMOGLOBIN A1C
Est. average glucose Bld gHb Est-mCnc: 206 mg/dL
Hgb A1c MFr Bld: 8.8 % — ABNORMAL HIGH (ref 4.8–5.6)

## 2019-12-17 ENCOUNTER — Telehealth: Payer: Self-pay | Admitting: Family Medicine

## 2019-12-17 DIAGNOSIS — Z23 Encounter for immunization: Secondary | ICD-10-CM | POA: Diagnosis not present

## 2019-12-17 NOTE — Telephone Encounter (Signed)
Called to give patient appointment information for Brain MRI (12/21/2019 @ 1:30 @ [facility])  Pt wants Dr. Nicki Reaper to know that his head has not hurt on bit since leaving the office the other day & thinks we should cancel MRI  Spoke with pt's wife, gave her MRI appt info & explained that we would cancel if Dr. Nicki Reaper agreed, she wanted to remind Dr. Nicki Reaper that the pt's insulin dose was increased at the last visit (& it may or may not have anything to do with needing the MRI)  Please advise & call pt

## 2019-12-17 NOTE — Telephone Encounter (Signed)
Nurses please talk with patient If he relates that his headaches have completely stopped we can watch this for the next 2 weeks then do a virtual visit follow-up phone call and if his headaches are still not going on he will not need the MRI If he starts having reoccurrence of the headaches we will have to move forward with the MRI  Based on the above canceled the MRI for the brain as scheduled thank you

## 2019-12-17 NOTE — Telephone Encounter (Signed)
Tried to call no answer

## 2019-12-18 NOTE — Telephone Encounter (Signed)
Called pt and he states his headaches are completely gone. He did want to cancel mri. I called

## 2019-12-18 NOTE — Telephone Encounter (Signed)
I called scheduling and canceled mri and transferred pt to the front to schedule virtual follow up with dr scott in 2 weeks.

## 2019-12-21 ENCOUNTER — Ambulatory Visit (HOSPITAL_COMMUNITY): Payer: Medicare Other

## 2020-01-01 ENCOUNTER — Other Ambulatory Visit: Payer: Self-pay

## 2020-01-01 ENCOUNTER — Ambulatory Visit (INDEPENDENT_AMBULATORY_CARE_PROVIDER_SITE_OTHER): Payer: Medicare Other | Admitting: Family Medicine

## 2020-01-01 DIAGNOSIS — R519 Headache, unspecified: Secondary | ICD-10-CM

## 2020-01-01 DIAGNOSIS — E1142 Type 2 diabetes mellitus with diabetic polyneuropathy: Secondary | ICD-10-CM

## 2020-01-01 DIAGNOSIS — E1165 Type 2 diabetes mellitus with hyperglycemia: Secondary | ICD-10-CM | POA: Diagnosis not present

## 2020-01-01 NOTE — Progress Notes (Signed)
   Subjective:    Patient ID: Luke Solis, male    DOB: 1931-03-14, 86 y.o.   MRN: [id number]  HPI  Patient calls for a follow up on recent headaches. Patient states he has not had any further problems with headaches. Patient states that headaches got much better when he changed pillows he denies any type of chest tightness pressure pain shortness of breath  States his sugars are running around 140 5 in the morning and 200-300 in the evening he is doing 7 units of insulin daily he is trying to watch how he eats denies blurred vision nausea vomiting  Patient did get first dose of Covid vaccine will get second dose in early February Virtual Visit via Video Note  I connected with Luke Solis on 01/01/20 at 10:00 AM EST by a video enabled telemedicine application and verified that I am speaking with the correct person using two identifiers.  Location: Patient: home Provider: office   I discussed the limitations of evaluation and management by telemedicine and the availability of in person appointments. The patient expressed understanding and agreed to proceed.  History of Present Illness:    Observations/Objective:   Assessment and Plan:   Follow Up Instructions:    I discussed the assessment and treatment plan with the patient. The patient was provided an opportunity to ask questions and all were answered. The patient agreed with the plan and demonstrated an understanding of the instructions.   The patient was advised to call back or seek an in-person evaluation if the symptoms worsen or if the condition fails to improve as anticipated.  I provided 22 minutes of non-face-to-face time during this encounter.      Review of Systems  Constitutional: Negative for diaphoresis and fatigue.  HENT: Negative for congestion and rhinorrhea.   Respiratory: Negative for cough and shortness of breath.   Cardiovascular: Negative for chest pain and leg swelling.  Gastrointestinal:  Negative for abdominal pain and diarrhea.  Skin: Negative for color change and rash.  Neurological: Negative for dizziness and headaches.  Psychiatric/Behavioral: Negative for behavioral problems and confusion.       Objective:   Physical Exam   Today's visit was via telephone Physical exam was not possible for this visit      Assessment & Plan:  Diabetes subpar control bump up insulin to 8 units each evening if morning sugars do not get within the 100-1 25 range neck step is bumped up to 9 units patient will follow this direction will give [place] update in approximately 2 to 3 weeks it should be noted that the evening sugars are staying in the 200-300 range this may need further addressing but currently right now we will stick with the approach listed here  Headaches these have completely resolved no need to do any type of cervical spine x-ray or MRI of the brain if his headaches return he will let us know

## 2020-01-14 DIAGNOSIS — Z23 Encounter for immunization: Secondary | ICD-10-CM | POA: Diagnosis not present

## 2020-01-21 ENCOUNTER — Telehealth: Payer: Self-pay | Admitting: Family Medicine

## 2020-01-21 ENCOUNTER — Other Ambulatory Visit: Payer: Self-pay

## 2020-01-21 MED ORDER — LANTUS SOLOSTAR 100 UNIT/ML ~~LOC~~ SOPN: PEN_INJECTOR | SUBCUTANEOUS | 0 refills | Status: DC

## 2020-01-21 NOTE — Telephone Encounter (Signed)
Please advise. Thank you

## 2020-01-21 NOTE — Telephone Encounter (Signed)
[place] the beach and forgot his insulin and needing a new prescription called into [facility]

## 2020-01-21 NOTE — Telephone Encounter (Signed)
Wife returned call and states that patient is using Lantus 9 units every evening. Sent in Lantus with directions to use 9 units each evening  may titrate up to 10 units above that. Pt wife verbalized understanding.

## 2020-01-21 NOTE — Telephone Encounter (Signed)
Tried calling patient twice but phone is busy; will try again later

## 2020-01-21 NOTE — Telephone Encounter (Signed)
May call in a prescription to the pharmacy as requested (Nurses please verify the dose that she is currently using, include that within the prescription and may titrate up to 10 units above that if necessary)

## 2020-01-23 ENCOUNTER — Other Ambulatory Visit: Payer: Self-pay | Admitting: Family Medicine

## 2020-02-27 DIAGNOSIS — L609 Nail disorder, unspecified: Secondary | ICD-10-CM | POA: Diagnosis not present

## 2020-02-27 DIAGNOSIS — E1140 Type 2 diabetes mellitus with diabetic neuropathy, unspecified: Secondary | ICD-10-CM | POA: Diagnosis not present

## 2020-02-27 DIAGNOSIS — M79671 Pain in right foot: Secondary | ICD-10-CM | POA: Diagnosis not present

## 2020-02-27 DIAGNOSIS — M79672 Pain in left foot: Secondary | ICD-10-CM | POA: Diagnosis not present

## 2020-03-20 ENCOUNTER — Ambulatory Visit (INDEPENDENT_AMBULATORY_CARE_PROVIDER_SITE_OTHER): Payer: Medicare Other | Admitting: Family Medicine

## 2020-03-20 ENCOUNTER — Telehealth: Payer: Self-pay | Admitting: Family Medicine

## 2020-03-20 ENCOUNTER — Other Ambulatory Visit: Payer: Self-pay

## 2020-03-20 DIAGNOSIS — M546 Pain in thoracic spine: Secondary | ICD-10-CM

## 2020-03-20 DIAGNOSIS — S29012A Strain of muscle and tendon of back wall of thorax, initial encounter: Secondary | ICD-10-CM
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
## 2020-03-20 NOTE — Telephone Encounter (Signed)
Fax from [facility] regarding patients Metformin 500 mg tablet. Directions state to take one tablet po BID with meals. Pt states the dose was decreased to one tablet daily. Pharmacy is needing an up to date script with actual dosage. Please advise. Thank you

## 2020-03-21 MED ORDER — METFORMIN HCL 500 MG PO TABS: ORAL_TABLET | ORAL | 5 refills | Status: DC

## 2020-03-21 NOTE — Telephone Encounter (Signed)
Prescription sent electronically to pharmacy. 

## 2020-03-21 NOTE — Telephone Encounter (Signed)
Metformin 1 daily, #30, 5 refills

## 2020-03-21 NOTE — Addendum Note (Signed)
Addended by: Dairl Ponder on: 03/21/2020 02:02 PM   Modules accepted: Orders

## 2020-03-24 DIAGNOSIS — R9721 Rising PSA following treatment for malignant neoplasm of prostate: Secondary | ICD-10-CM | POA: Diagnosis not present

## 2020-03-24 DIAGNOSIS — N35014 Post-traumatic urethral stricture, male, unspecified: Secondary | ICD-10-CM | POA: Diagnosis not present

## 2020-03-24 DIAGNOSIS — N3040 Irradiation cystitis without hematuria: Secondary | ICD-10-CM | POA: Diagnosis not present

## 2020-04-16 ENCOUNTER — Other Ambulatory Visit: Payer: Self-pay | Admitting: Family Medicine

## 2020-04-16 ENCOUNTER — Other Ambulatory Visit: Payer: Self-pay | Admitting: *Deleted

## 2020-04-16 MED ORDER — PREGABALIN 100 MG PO CAPS: ORAL_CAPSULE | ORAL | 1 refills | Status: DC

## 2020-04-18 ENCOUNTER — Encounter: Payer: Self-pay | Admitting: Family Medicine

## 2020-04-18 DIAGNOSIS — H43813 Vitreous degeneration, bilateral: Secondary | ICD-10-CM | POA: Diagnosis not present

## 2020-04-18 DIAGNOSIS — H02831 Dermatochalasis of right upper eyelid: Secondary | ICD-10-CM | POA: Diagnosis not present

## 2020-04-18 DIAGNOSIS — H26492 Other secondary cataract, left eye: Secondary | ICD-10-CM | POA: Diagnosis not present

## 2020-04-18 DIAGNOSIS — H40013 Open angle with borderline findings, low risk, bilateral: Secondary | ICD-10-CM | POA: Diagnosis not present

## 2020-04-18 DIAGNOSIS — H02834 Dermatochalasis of left upper eyelid: Secondary | ICD-10-CM | POA: Diagnosis not present

## 2020-04-18 DIAGNOSIS — E119 Type 2 diabetes mellitus without complications: Secondary | ICD-10-CM | POA: Diagnosis not present

## 2020-04-18 DIAGNOSIS — Z961 Presence of intraocular lens: Secondary | ICD-10-CM | POA: Diagnosis not present

## 2020-04-18 LAB — HM DIABETES EYE EXAM

## 2020-05-07 DIAGNOSIS — M79672 Pain in left foot: Secondary | ICD-10-CM | POA: Diagnosis not present

## 2020-05-07 DIAGNOSIS — L609 Nail disorder, unspecified: Secondary | ICD-10-CM | POA: Diagnosis not present

## 2020-05-07 DIAGNOSIS — E1140 Type 2 diabetes mellitus with diabetic neuropathy, unspecified: Secondary | ICD-10-CM | POA: Diagnosis not present

## 2020-05-07 DIAGNOSIS — M79671 Pain in right foot: Secondary | ICD-10-CM | POA: Diagnosis not present

## 2020-06-10 ENCOUNTER — Other Ambulatory Visit: Payer: Self-pay | Admitting: Family Medicine

## 2020-06-16 ENCOUNTER — Other Ambulatory Visit: Payer: Self-pay | Admitting: Family Medicine

## 2020-06-16 DIAGNOSIS — E1140 Type 2 diabetes mellitus with diabetic neuropathy, unspecified: Secondary | ICD-10-CM

## 2020-06-16 DIAGNOSIS — I10 Essential (primary) hypertension: Secondary | ICD-10-CM

## 2020-06-16 DIAGNOSIS — E7849 Other hyperlipidemia: Secondary | ICD-10-CM

## 2020-06-17 NOTE — Telephone Encounter (Signed)
CMP, lipid, A1c May have 90-day Needs to schedule office visit

## 2020-06-19 ENCOUNTER — Emergency Department (HOSPITAL_COMMUNITY): Payer: Medicare Other

## 2020-06-19 ENCOUNTER — Other Ambulatory Visit: Payer: Self-pay

## 2020-06-19 ENCOUNTER — Encounter (HOSPITAL_COMMUNITY): Payer: Self-pay | Admitting: *Deleted

## 2020-06-19 ENCOUNTER — Emergency Department (HOSPITAL_COMMUNITY)
Admission: EM | Admit: 2020-06-19 | Discharge: 2020-06-19 | Disposition: A | Discharge status: Home / Self Care | Payer: Medicare Other | Attending: Emergency Medicine | Admitting: Emergency Medicine

## 2020-06-19 DIAGNOSIS — E1140 Type 2 diabetes mellitus with diabetic neuropathy, unspecified: Secondary | ICD-10-CM | POA: Insufficient documentation

## 2020-06-19 DIAGNOSIS — Z7984 Long term (current) use of oral hypoglycemic drugs: Secondary | ICD-10-CM | POA: Diagnosis not present

## 2020-06-19 DIAGNOSIS — Z8546 Personal history of malignant neoplasm of prostate: Secondary | ICD-10-CM | POA: Diagnosis not present

## 2020-06-19 DIAGNOSIS — Z79899 Other long term (current) drug therapy: Secondary | ICD-10-CM | POA: Insufficient documentation

## 2020-06-19 DIAGNOSIS — E860 Dehydration: Secondary | ICD-10-CM

## 2020-06-19 DIAGNOSIS — I2510 Atherosclerotic heart disease of native coronary artery without angina pectoris: Secondary | ICD-10-CM | POA: Insufficient documentation

## 2020-06-19 DIAGNOSIS — R55 Syncope and collapse: Secondary | ICD-10-CM | POA: Insufficient documentation

## 2020-06-19 DIAGNOSIS — I10 Essential (primary) hypertension: Secondary | ICD-10-CM | POA: Insufficient documentation

## 2020-06-19 LAB — BASIC METABOLIC PANEL
Anion gap: 11 (ref 5–15)
BUN: 32 mg/dL — ABNORMAL HIGH (ref 8–23)
CO2: 25 mmol/L (ref 22–32)
Calcium: 9.7 mg/dL (ref 8.9–10.3)
Chloride: 103 mmol/L (ref 98–111)
Creatinine, Ser: 1.63 mg/dL — ABNORMAL HIGH (ref 0.61–1.24)
GFR calc Af Amer: 44 mL/min — ABNORMAL LOW (ref >60)
GFR calc non Af Amer: 38 mL/min — ABNORMAL LOW (ref >60)
Glucose, Bld: 230 mg/dL — ABNORMAL HIGH (ref 70–99)
Potassium: 3.6 mmol/L (ref 3.5–5.1)
Sodium: 139 mmol/L (ref 135–145)

## 2020-06-19 LAB — CBC
HCT: 40.9 % (ref 39.0–52.0)
Hemoglobin: 13.4 g/dL (ref 13.0–17.0)
MCH: 31.2 pg (ref 26.0–34.0)
MCHC: 32.8 g/dL (ref 30.0–36.0)
MCV: 95.1 fL (ref 80.0–100.0)
Platelets: 172 10*3/uL (ref 150–400)
RBC: 4.3 MIL/uL (ref 4.22–5.81)
RDW: 12.4 % (ref 11.5–15.5)
WBC: 12.2 10*3/uL — ABNORMAL HIGH (ref 4.0–10.5)
nRBC: 0 % (ref 0.0–0.2)

## 2020-06-19 LAB — CBG MONITORING, ED: Glucose-Capillary: 263 mg/dL — ABNORMAL HIGH (ref 70–99)

## 2020-06-19 MED ORDER — SODIUM CHLORIDE 0.9 % IV BOLUS
500.0000 mL | Freq: Once | INTRAVENOUS | Status: AC
  Administered 2020-06-19: 500 mL via INTRAVENOUS

## 2020-06-19 MED ORDER — SODIUM CHLORIDE 0.9 % IV SOLN: INTRAVENOUS | Status: DC

## 2020-06-19 MED ORDER — SODIUM CHLORIDE 0.9% FLUSH: 3.0000 mL | Freq: Once | INTRAVENOUS | Status: DC

## 2020-06-19 NOTE — Discharge Instructions (Addendum)
Continue to hydrate yourself well at home.  Stay out of the hot sun for the next few days.  Make an appointment to follow-up with your regular doctor.  Return for any new or worse or recurrent symptoms.

## 2020-06-19 NOTE — ED Provider Notes (Signed)
[facility] EMERGENCY DEPARTMENT Provider Note   CSN: [id number] Arrival date & time: 06/19/20  1317     History Chief Complaint  Patient presents with  . Near Syncope    Luke Solis is a 86 y.o. male.  Patient became very weak after being out in the hot sun since early this morning.  Initially he sat down on some cinder blocks and he decided he needed to try to crawl to his truck but was too weak to get there.  Never passed out.  Past medical history significant for coronary artery disease diabetes hypertension hyperlipidemia patient now that he is in a cool environment has had some things to drink is feeling better.  Patient denies any chest pain or abdominal pain or any shortness of breath.  Patient's been able to walk here says the strength is back.        Past Medical History:  Diagnosis Date  . Coronary atherosclerosis of native coronary artery    BMS proximal and mid RCA as well as BMS circumflex - 2002 (had residual 70% distal LAD), LVEF 60%  . Diabetes mellitus without complication ([place])   . Essential hypertension   . Hyperlipidemia   . Neuropathy   . NSTEMI (non-ST elevated myocardial infarction) ([place])    2002  . Prostate cancer ([place])   . Sleep apnea    On CPAP  . Stroke [facility])     Patient Active Problem List   Diagnosis Date Noted  . Poorly controlled type 2 diabetes mellitus with peripheral neuropathy ([place]) 08/11/2019  . Obstructive sleep apnea treated with continuous positive airway pressure (CPAP) 09/05/2018  . Altered mental status 02/28/2018  . Anemia 02/28/2018  . Fatigue 08/03/2017  . Prostate cancer ([place]) 10/22/2015  . Forgetfulness 04/29/2015  . Type 2 diabetes mellitus with diabetic neuropathy ([place]) 07/17/2013  . Hyperlipidemia 01/19/2010  . OSA (obstructive sleep apnea) 01/19/2010  . Essential hypertension, benign 01/19/2010  . Coronary atherosclerosis of native coronary artery 01/19/2010    Past Surgical History:  Procedure Laterality  Date  . COLONOSCOPY    . INGUINAL HERNIA REPAIR    . KNEE SURGERY    . NOSE SURGERY    . PROSTATE SURGERY    . TONSILLECTOMY         Family History  Problem Relation Age of Onset  . Heart attack Mother   . Diabetes Mother   . Coronary artery disease Father   . Hypertension Father   . Heart attack Father   . Stroke Maternal Grandfather   . Lung disease Neg Hx   . Cancer Neg Hx     Social History   Tobacco Use  . Smoking status: Never Smoker  . Smokeless tobacco: Never Used  Vaping Use  . Vaping Use: Never used  Substance Use Topics  . Alcohol use: No    Alcohol/week: 0.0 standard drinks  . Drug use: No    Home Medications Prior to Admission medications   Medication Sig Start Date End Date Taking? Authorizing Provider  amLODipine (NORVASC) 2.5 MG tablet Take 1 tablet (2.5 mg total) by mouth daily. 11/26/19   Kathyrn Drown, MD  Calcium Carbonate-Vitamin D 600-400 MG-UNIT per tablet Take 1 tablet by mouth daily.    [provider]  fexofenadine (ALLEGRA) 180 MG tablet TAKE 1 TABLET BY MOUTH ONCE DAILY AS NEEDED FOR ALLERGIES. 05/07/19   Kathyrn Drown, MD  FORACARE PREMIUM V10 TEST test strip USE AS DIRECTED. 07/20/17  Mikey Kirschner, MD  insulin glargine (LANTUS SOLOSTAR) 100 UNIT/ML Solostar Pen INJECT 4 UNITS EVERY EVENING. MAY TITRATE UP TO 20 UNITS. 06/17/20   Kathyrn Drown, MD  metFORMIN (GLUCOPHAGE) 500 MG tablet TAKE 1 TABLET BY MOUTH DAILY 03/21/20   Kathyrn Drown, MD  Multiple Vitamin (MULTIVITAMIN) tablet Take 1 tablet by mouth daily.    [provider]  mupirocin ointment (BACTROBAN) 2 % Apply thin amount daily 05/30/19   [name], [name], MD  nitroGLYCERIN (NITROSTAT) 0.4 MG SL tablet PLACE ONE (1) TABLET UNDER TONGUE EVERY 5 MINUTES UP TO (3) DOSES AS NEEDED FOR CHEST PAIN. 07/12/19   Kathyrn Drown, MD  pregabalin (LYRICA) 100 MG capsule TAKE 1 CAPSULE IN THE MORNING AND 2 CAPSULES AT BEDTIME 06/10/20   [name], Deakin Lacek A, MD  rosuvastatin  (CRESTOR) 40 MG tablet Take 1 tablet (40 mg total) by mouth daily. 11/26/19   Kathyrn Drown, MD    Allergies    Eual Fines Marta Lamas oleracea] and Celery oil  Review of Systems   Review of Systems  Constitutional: Positive for fatigue. Negative for chills and fever.  HENT: Negative for congestion, rhinorrhea and sore throat.   Eyes: Negative for visual disturbance.  Respiratory: Negative for cough and shortness of breath.   Cardiovascular: Negative for chest pain and leg swelling.  Gastrointestinal: Negative for abdominal pain, diarrhea, nausea and vomiting.  Genitourinary: Negative for dysuria.  Musculoskeletal: Negative for back pain and neck pain.  Skin: Negative for rash.  Neurological: Positive for weakness. Negative for dizziness, seizures, syncope, light-headedness and headaches.  Hematological: Does not bruise/bleed easily.  Psychiatric/Behavioral: Negative for confusion.    Physical Exam Updated Vital Signs BP (!) 152/80   Pulse 62   Temp (!) 97.3 F (36.3 C)   Resp 20   Ht 1.88 m (6\' 2" )   SpO2 100%   BMI 21.47 kg/m   Physical Exam Vitals and nursing note reviewed.  Constitutional:      Appearance: Normal appearance. He is well-developed.  HENT:     Head: Normocephalic and atraumatic.  Eyes:     Extraocular Movements: Extraocular movements intact.     Conjunctiva/sclera: Conjunctivae normal.     Pupils: Pupils are equal, round, and reactive to light.  Cardiovascular:     Rate and Rhythm: Normal rate and regular rhythm.     Heart sounds: No murmur heard.   Pulmonary:     Effort: Pulmonary effort is normal. No respiratory distress.     Breath sounds: Normal breath sounds.  Abdominal:     Palpations: Abdomen is soft.     Tenderness: There is no abdominal tenderness.  Musculoskeletal:        General: No swelling. Normal range of motion.     Cervical back: Normal range of motion and neck supple.  Skin:    General: Skin is warm and dry.     Capillary  Refill: Capillary refill takes less than 2 seconds.  Neurological:     General: No focal deficit present.     Mental Status: He is alert and oriented to person, place, and time.     Cranial Nerves: No cranial nerve deficit.     Sensory: No sensory deficit.     Motor: No weakness.     Coordination: Coordination normal.     ED Results / Procedures / Treatments   Labs (all labs ordered are listed, but only abnormal results are displayed) Labs Reviewed  BASIC METABOLIC PANEL -  Abnormal; Notable for the following components:      Result Value   Glucose, Bld 230 (*)    BUN 32 (*)    Creatinine, Ser 1.63 (*)    GFR calc non Af Amer 38 (*)    GFR calc Af Amer 44 (*)    All other components within normal limits  CBC - Abnormal; Notable for the following components:   WBC 12.2 (*)    All other components within normal limits  CBG MONITORING, ED - Abnormal; Notable for the following components:   Glucose-Capillary 263 (*)    All other components within normal limits  URINALYSIS, ROUTINE W REFLEX MICROSCOPIC    EKG EKG Interpretation  Date/Time:  Thursday June 19 2020 14:45:58 EDT Ventricular Rate:  68 PR Interval:  224 QRS Duration: 94 QT Interval:  408 QTC Calculation: 433 R Axis:   -48 Text Interpretation: Sinus rhythm with 1st degree A-V block Left axis deviation Incomplete right bundle branch block Inferior infarct , age undetermined Abnormal ECG Confirmed by Fredia Sorrow [phone]) on 06/19/2020 3:34:13 PM   Radiology DG Chest Port 1 View  Result Date: 06/19/2020 CLINICAL DATA:  Syncope. EXAM: PORTABLE CHEST 1 VIEW COMPARISON:  February 28, 2018. FINDINGS: The heart size and mediastinal contours are within normal limits. Both lungs are clear. The visualized skeletal structures are unremarkable. IMPRESSION: No active disease. Electronically Signed   By: Marijo Conception M.D.   On: 06/19/2020 16:19    Procedures Procedures (including critical care time)  Medications Ordered  in ED Medications  sodium chloride flush (NS) 0.9 % injection 3 mL (0 mLs Intravenous Hold 06/19/20 1547)  0.9 %  sodium chloride infusion ( Intravenous New Bag/Given 06/19/20 1735)  sodium chloride 0.9 % bolus 500 mL (500 mLs Intravenous New Bag/Given 06/19/20 1734)    ED Course  I have reviewed the triage vital signs and the nursing notes.  Pertinent labs & imaging results that were available during my care of the patient were reviewed by me and considered in my medical decision making (see chart for details).    MDM Rules/Calculators/A&P                          Patient's BUN and creatinine is a little elevated from baseline.  Suspect some mild degree of dehydration.  Patient overall feeling better.  Blood sugar actually a little bit on the high side.  Little bit of leukocytosis.  No upper respiratory symptoms no of viral type symptoms.  Patient has urinary incontinence so felt he could not provide a urine.  The patient given some IV fluids here and feels much better.  Patient's been able to ambulate here without any difficulty patient stable for discharge home.  Patient will return for any new or worse or recurrent symptoms.  Patient will continue to hydrate.   Final Clinical Impression(s) / ED Diagnoses Final diagnoses:  Near syncope  Dehydration    Rx / DC Orders ED Discharge Orders    None       Fredia Sorrow, MD 06/19/20 1756

## 2020-06-19 NOTE — ED Triage Notes (Signed)
States he was working outside and became hot and was unable to get up. States he  Was weak

## 2020-07-10 ENCOUNTER — Other Ambulatory Visit: Payer: Self-pay | Admitting: Family Medicine

## 2020-07-22 ENCOUNTER — Other Ambulatory Visit: Payer: Self-pay | Admitting: Family Medicine

## 2020-07-23 DIAGNOSIS — M79672 Pain in left foot: Secondary | ICD-10-CM | POA: Diagnosis not present

## 2020-07-23 DIAGNOSIS — E1140 Type 2 diabetes mellitus with diabetic neuropathy, unspecified: Secondary | ICD-10-CM | POA: Diagnosis not present

## 2020-07-23 DIAGNOSIS — L609 Nail disorder, unspecified: Secondary | ICD-10-CM | POA: Diagnosis not present

## 2020-07-23 DIAGNOSIS — M79671 Pain in right foot: Secondary | ICD-10-CM | POA: Diagnosis not present

## 2020-08-10 ENCOUNTER — Other Ambulatory Visit: Payer: Self-pay | Admitting: Family Medicine

## 2020-09-22 DIAGNOSIS — C61 Malignant neoplasm of prostate: Secondary | ICD-10-CM | POA: Diagnosis not present

## 2020-09-22 DIAGNOSIS — R972 Elevated prostate specific antigen [PSA]: Secondary | ICD-10-CM | POA: Diagnosis not present

## 2020-09-22 DIAGNOSIS — N3946 Mixed incontinence: Secondary | ICD-10-CM | POA: Diagnosis not present

## 2020-09-22 DIAGNOSIS — N35014 Post-traumatic urethral stricture, male, unspecified: Secondary | ICD-10-CM | POA: Diagnosis not present

## 2020-09-22 DIAGNOSIS — N3040 Irradiation cystitis without hematuria: Secondary | ICD-10-CM | POA: Diagnosis not present

## 2020-09-23 ENCOUNTER — Other Ambulatory Visit: Payer: Self-pay | Admitting: Family Medicine

## 2020-10-08 DIAGNOSIS — E1140 Type 2 diabetes mellitus with diabetic neuropathy, unspecified: Secondary | ICD-10-CM | POA: Diagnosis not present

## 2020-10-08 DIAGNOSIS — M79672 Pain in left foot: Secondary | ICD-10-CM | POA: Diagnosis not present

## 2020-10-08 DIAGNOSIS — M79671 Pain in right foot: Secondary | ICD-10-CM | POA: Diagnosis not present

## 2020-10-08 DIAGNOSIS — L609 Nail disorder, unspecified: Secondary | ICD-10-CM | POA: Diagnosis not present

## 2020-10-13 DIAGNOSIS — Z23 Encounter for immunization: Secondary | ICD-10-CM | POA: Diagnosis not present

## 2020-10-20 ENCOUNTER — Other Ambulatory Visit: Payer: Self-pay

## 2020-10-20 ENCOUNTER — Ambulatory Visit (INDEPENDENT_AMBULATORY_CARE_PROVIDER_SITE_OTHER): Payer: Medicare Other | Admitting: Family Medicine

## 2020-10-20 VITALS — BP 126/74 | HR 68 | Temp 97.2°F | Ht 70.0 in | Wt 167.0 lb

## 2020-10-20 DIAGNOSIS — R400 Somnolence: Secondary | ICD-10-CM | POA: Diagnosis not present

## 2020-10-20 DIAGNOSIS — E1140 Type 2 diabetes mellitus with diabetic neuropathy, unspecified: Secondary | ICD-10-CM

## 2020-10-20 DIAGNOSIS — R5383 Other fatigue: Secondary | ICD-10-CM

## 2020-10-20 DIAGNOSIS — Z23 Encounter for immunization: Secondary | ICD-10-CM

## 2020-10-20 DIAGNOSIS — N3000 Acute cystitis without hematuria: Secondary | ICD-10-CM | POA: Diagnosis not present

## 2020-10-20 DIAGNOSIS — R350 Frequency of micturition: Secondary | ICD-10-CM | POA: Diagnosis not present

## 2020-10-20 LAB — POCT URINALYSIS DIPSTICK
Spec Grav, UA: <=1.005 — AB (ref 1.010–1.025)
pH, UA: 5 (ref 5.0–8.0)

## 2020-10-20 MED ORDER — CEFPROZIL 500 MG PO TABS: 500.0000 mg | ORAL_TABLET | Freq: Two times a day (BID) | ORAL | 0 refills | Status: DC

## 2020-10-20 NOTE — Progress Notes (Signed)
   Subjective:    Patient ID: Luke Solis, male    DOB: 25-Dec-1930, 86 y.o.   MRN: [id number]  HPIpt having urinary frequency for awhile but more lately and started to have some burning with urination.  Patient with urinary frequency some dysuria.  Denies high fever chills or sweats no nausea vomiting or diarrhea.  No hematuria no rectal bleeding  Patient also with a lot of fatigue tiredness falling asleep during the day.  Has sleep apnea.  In addition to this has underlying diabetes which has been under fairly good control in the past but has had some times where it was not under good control. Eyes started watering today.   Would like to get a flu vaccine today. Had covid vaccine one week ago.  Please see above   Review of Systems    Please see above Objective:   Physical Exam Lungs clear respiratory rate normal heart regular no murmurs extremities no edema skin warm dry abdomen soft UA with WBCs      Assessment & Plan:  1. Urinary frequency We will check urine culture.  Treat with antibiotics for 2 weeks just in case prostate is involved - POCT urinalysis dipstick - Hepatic function panel - CBC with Differential/Platelet - TSH - Hemoglobin H0Q - Basic metabolic panel - Urine Culture  2. Type 2 diabetes mellitus with diabetic neuropathy, without long-term current use of insulin (HCC) Check A1c watch diet closely continue medications - Hepatic function panel - CBC with Differential/Platelet - TSH - Hemoglobin M5H - Basic metabolic panel  3. Daytime somnolence I believe this is more age-related but could be related to his CPAP we will wait to see what lab results show may need to connect with his sleep doctor - Hepatic function panel - CBC with Differential/Platelet - TSH - Hemoglobin Q4O - Basic metabolic panel  4. Need for influenza vaccination Flu shot today - Flu Vaccine QUAD High Dose(Fluad) - Hepatic function panel - CBC with Differential/Platelet - TSH -  Hemoglobin N6E - Basic metabolic panel  5. Other fatigue Check lab work including thyroid because of significant fatigue tiredness - Hepatic function panel - CBC with Differential/Platelet - TSH - Hemoglobin X5M - Basic metabolic panel  6. Acute cystitis without hematuria Treat with antibiotics for 2 weeks await results

## 2020-10-21 ENCOUNTER — Other Ambulatory Visit: Payer: Self-pay | Admitting: Family Medicine

## 2020-10-21 LAB — TSH: TSH: 1.74 u[IU]/mL (ref 0.450–4.500)

## 2020-10-21 LAB — CBC WITH DIFFERENTIAL/PLATELET
Basophils Absolute: 0.1 10*3/uL (ref 0.0–0.2)
Basos: 1 %
EOS (ABSOLUTE): 0.5 10*3/uL — ABNORMAL HIGH (ref 0.0–0.4)
Eos: 8 %
Hematocrit: 37.5 % (ref 37.5–51.0)
Hemoglobin: 12.6 g/dL — ABNORMAL LOW (ref 13.0–17.7)
Immature Grans (Abs): 0 10*3/uL (ref 0.0–0.1)
Immature Granulocytes: 0 %
Lymphocytes Absolute: 1.5 10*3/uL (ref 0.7–3.1)
Lymphs: 23 %
MCH: 31.2 pg (ref 26.6–33.0)
MCHC: 33.6 g/dL (ref 31.5–35.7)
MCV: 93 fL (ref 79–97)
Monocytes Absolute: 0.5 10*3/uL (ref 0.1–0.9)
Monocytes: 7 %
Neutrophils Absolute: 3.8 10*3/uL (ref 1.4–7.0)
Neutrophils: 61 %
Platelets: 175 10*3/uL (ref 150–450)
RBC: 4.04 x10E6/uL — ABNORMAL LOW (ref 4.14–5.80)
RDW: 11.7 % (ref 11.6–15.4)
WBC: 6.2 10*3/uL (ref 3.4–10.8)

## 2020-10-21 LAB — HEPATIC FUNCTION PANEL
ALT: 16 IU/L (ref 0–44)
AST: 18 IU/L (ref 0–40)
Albumin: 4.3 g/dL (ref 3.6–4.6)
Alkaline Phosphatase: 68 IU/L (ref 44–121)
Bilirubin Total: 0.3 mg/dL (ref 0.0–1.2)
Bilirubin, Direct: <0.1 mg/dL (ref 0.00–0.40)
Total Protein: 6.8 g/dL (ref 6.0–8.5)

## 2020-10-21 LAB — BASIC METABOLIC PANEL
BUN/Creatinine Ratio: 18 (ref 10–24)
BUN: 27 mg/dL (ref 8–27)
CO2: 27 mmol/L (ref 20–29)
Calcium: 8.7 mg/dL (ref 8.6–10.2)
Chloride: 103 mmol/L (ref 96–106)
Creatinine, Ser: 1.48 mg/dL — ABNORMAL HIGH (ref 0.76–1.27)
GFR calc Af Amer: 48 mL/min/{1.73_m2} — ABNORMAL LOW (ref >59)
GFR calc non Af Amer: 42 mL/min/{1.73_m2} — ABNORMAL LOW (ref >59)
Glucose: 374 mg/dL — ABNORMAL HIGH (ref 65–99)
Potassium: 4.2 mmol/L (ref 3.5–5.2)
Sodium: 142 mmol/L (ref 134–144)

## 2020-10-21 LAB — HEMOGLOBIN A1C
Est. average glucose Bld gHb Est-mCnc: 243 mg/dL
Hgb A1c MFr Bld: 10.1 % — ABNORMAL HIGH (ref 4.8–5.6)

## 2020-10-21 NOTE — Telephone Encounter (Signed)
Last med check up 11/26/19

## 2020-10-22 LAB — URINE CULTURE

## 2020-11-09 ENCOUNTER — Other Ambulatory Visit: Payer: Self-pay | Admitting: Family Medicine

## 2020-11-18 ENCOUNTER — Encounter: Payer: Self-pay | Admitting: Family Medicine

## 2020-11-18 ENCOUNTER — Other Ambulatory Visit: Payer: Self-pay

## 2020-11-18 ENCOUNTER — Other Ambulatory Visit (HOSPITAL_COMMUNITY)
Admission: RE | Admit: 2020-11-18 | Discharge: 2020-11-18 | Disposition: A | Discharge status: Home / Self Care | Payer: Medicare Other | Source: Ambulatory Visit | Attending: Family Medicine | Admitting: Family Medicine

## 2020-11-18 ENCOUNTER — Ambulatory Visit (INDEPENDENT_AMBULATORY_CARE_PROVIDER_SITE_OTHER): Payer: Medicare Other | Admitting: Family Medicine

## 2020-11-18 VITALS — BP 138/82 | HR 86 | Temp 97.9°F | Ht 70.0 in | Wt 163.0 lb

## 2020-11-18 DIAGNOSIS — R5383 Other fatigue: Secondary | ICD-10-CM

## 2020-11-18 DIAGNOSIS — R4182 Altered mental status, unspecified: Secondary | ICD-10-CM | POA: Insufficient documentation

## 2020-11-18 DIAGNOSIS — R4189 Other symptoms and signs involving cognitive functions and awareness: Secondary | ICD-10-CM

## 2020-11-18 DIAGNOSIS — R531 Weakness: Secondary | ICD-10-CM

## 2020-11-18 LAB — COMPREHENSIVE METABOLIC PANEL
ALT: 17 U/L (ref 0–44)
AST: 16 U/L (ref 15–41)
Albumin: 3.8 g/dL (ref 3.5–5.0)
Alkaline Phosphatase: 54 U/L (ref 38–126)
Anion gap: 8 (ref 5–15)
BUN: 25 mg/dL — ABNORMAL HIGH (ref 8–23)
CO2: 27 mmol/L (ref 22–32)
Calcium: 8.9 mg/dL (ref 8.9–10.3)
Chloride: 99 mmol/L (ref 98–111)
Creatinine, Ser: 1.38 mg/dL — ABNORMAL HIGH (ref 0.61–1.24)
GFR, Estimated: 49 mL/min — ABNORMAL LOW (ref >60)
Glucose, Bld: 329 mg/dL — ABNORMAL HIGH (ref 70–99)
Potassium: 3.8 mmol/L (ref 3.5–5.1)
Sodium: 134 mmol/L — ABNORMAL LOW (ref 135–145)
Total Bilirubin: 0.7 mg/dL (ref 0.3–1.2)
Total Protein: 7.1 g/dL (ref 6.5–8.1)

## 2020-11-18 LAB — CBC WITH DIFFERENTIAL/PLATELET
Abs Immature Granulocytes: 0.02 10*3/uL (ref 0.00–0.07)
Basophils Absolute: 0 10*3/uL (ref 0.0–0.1)
Basophils Relative: 1 %
Eosinophils Absolute: 0.1 10*3/uL (ref 0.0–0.5)
Eosinophils Relative: 1 %
HCT: 38.5 % — ABNORMAL LOW (ref 39.0–52.0)
Hemoglobin: 12.8 g/dL — ABNORMAL LOW (ref 13.0–17.0)
Immature Granulocytes: 0 %
Lymphocytes Relative: 18 %
Lymphs Abs: 1.1 10*3/uL (ref 0.7–4.0)
MCH: 31.1 pg (ref 26.0–34.0)
MCHC: 33.2 g/dL (ref 30.0–36.0)
MCV: 93.7 fL (ref 80.0–100.0)
Monocytes Absolute: 0.5 10*3/uL (ref 0.1–1.0)
Monocytes Relative: 8 %
Neutro Abs: 4.5 10*3/uL (ref 1.7–7.7)
Neutrophils Relative %: 72 %
Platelets: 148 10*3/uL — ABNORMAL LOW (ref 150–400)
RBC: 4.11 MIL/uL — ABNORMAL LOW (ref 4.22–5.81)
RDW: 12 % (ref 11.5–15.5)
WBC: 6.2 10*3/uL (ref 4.0–10.5)
nRBC: 0 % (ref 0.0–0.2)

## 2020-11-18 NOTE — Progress Notes (Signed)
Subjective:    Patient ID: Luke Solis, male    DOB: 1931/07/05, 86 y.o.   MRN: [id number]  HPIpt's wife states he was not acting normal this morning. States he sat on toliet and stayed there for awhile. Told wife nothing was wrong but did not want to get up and get going anywhere. States he is not moving or doing what he should be doing and not talking much.   bs this morning was 220 or 203  Per pt's wife. Brought in blood sugar readings.   Last evening he states he was watching some TV.  His wife relates that she went to bed and somewhere after 1 AM he came to bed.  He left the fireplace/gas logs going.  He did not turn out all the lights  This morning when they awoke he had had a accident in the bed with urination.  He was also very slow in his cognitive thinking Difficult time processing and was also slow in his movement.  His wife relates that he was weaker than normal.  And having to lay around a lot.  Family relates it was difficult to get him up  He denies any high fever does relate having some chills denies sweats cough runny nose vomiting diarrhea dysuria Review of Systems  Constitutional: Positive for fatigue. Negative for activity change and fever.  HENT: Negative for congestion and rhinorrhea.   Respiratory: Negative for cough and shortness of breath.   Cardiovascular: Negative for chest pain and leg swelling.  Gastrointestinal: Negative for abdominal pain, diarrhea and nausea.  Genitourinary: Negative for dysuria and hematuria.  Neurological: Negative for weakness and headaches.  Psychiatric/Behavioral: Negative for agitation and behavioral problems.       Objective:   Physical Exam Vitals reviewed.  Constitutional:      General: He is not in acute distress.    Appearance: He is well-nourished.  HENT:     Head: Normocephalic and atraumatic.  Eyes:     General:        Right eye: No discharge.        Left eye: No discharge.  Neck:     Trachea: No tracheal  deviation.  Cardiovascular:     Rate and Rhythm: Normal rate and regular rhythm.     Heart sounds: Normal heart sounds. No murmur heard.   Pulmonary:     Effort: Pulmonary effort is normal. No respiratory distress.     Breath sounds: Normal breath sounds.  Musculoskeletal:        General: No edema.  Lymphadenopathy:     Cervical: No cervical adenopathy.  Skin:    General: Skin is warm and dry.  Neurological:     Mental Status: He is alert.     Coordination: Coordination normal.  Psychiatric:        Mood and Affect: Mood and affect normal.        Behavior: Behavior normal.    Patient is not his usual vivacious self.  Slower to respond.  Not as cognitively active but is aware of where he is and who I was. Wife states that he use the bathroom this morning did not flush it and left lights on which was unusual for him      Assessment & Plan:  1. Other fatigue We will check lab work to make sure there is not a metabolic derangement - CBC with Differential/Platelet - Comprehensive metabolic panel  2. Cognitive impairment This is concerning for the possibility  of a stroke.  Patient has had a stroke in the past.  Although he does not have any unilateral numbness or weakness it is clear that his cognitive processing is not as good as what it was and that this was a sudden onset over the past 24 hours - MR Brain Wo Contrast - Urine Culture - CBC with Differential/Platelet - Comprehensive metabolic panel  3. Weakness Mild weakness check lab work - MR Brain Wo Contrast - CBC with Differential/Platelet - Comprehensive metabolic panel  4. Altered mental status, unspecified altered mental status type Please see above.  MRI will be important make sure that he has not had an acute stroke.  I do not feel the patient needs to go the ER currently nor do I feel he needs to be admitted currently but it is medically necessary for the MRI to be done this week - MR Brain Wo Contrast - CBC  with Differential/Platelet - Comprehensive metabolic panel  Stat labs ordered await the results of these

## 2020-11-19 ENCOUNTER — Ambulatory Visit (HOSPITAL_COMMUNITY)
Admission: RE | Admit: 2020-11-19 | Discharge: 2020-11-19 | Disposition: A | Discharge status: Home / Self Care | Payer: Medicare Other | Source: Ambulatory Visit | Attending: Family Medicine | Admitting: Family Medicine

## 2020-11-19 ENCOUNTER — Telehealth: Payer: Self-pay | Admitting: Family Medicine

## 2020-11-19 ENCOUNTER — Other Ambulatory Visit: Payer: Self-pay | Admitting: *Deleted

## 2020-11-19 ENCOUNTER — Other Ambulatory Visit: Payer: Self-pay

## 2020-11-19 DIAGNOSIS — R4189 Other symptoms and signs involving cognitive functions and awareness: Secondary | ICD-10-CM | POA: Diagnosis not present

## 2020-11-19 DIAGNOSIS — R4182 Altered mental status, unspecified: Secondary | ICD-10-CM | POA: Diagnosis not present

## 2020-11-19 DIAGNOSIS — R39198 Other difficulties with micturition: Secondary | ICD-10-CM

## 2020-11-19 DIAGNOSIS — G319 Degenerative disease of nervous system, unspecified: Secondary | ICD-10-CM | POA: Diagnosis not present

## 2020-11-19 DIAGNOSIS — I6782 Cerebral ischemia: Secondary | ICD-10-CM | POA: Diagnosis not present

## 2020-11-19 DIAGNOSIS — R350 Frequency of micturition: Secondary | ICD-10-CM

## 2020-11-19 DIAGNOSIS — R531 Weakness: Secondary | ICD-10-CM | POA: Insufficient documentation

## 2020-11-19 DIAGNOSIS — G9389 Other specified disorders of brain: Secondary | ICD-10-CM | POA: Diagnosis not present

## 2020-11-19 LAB — POCT URINALYSIS DIPSTICK
Glucose, UA: POSITIVE — AB
Spec Grav, UA: 1.02 (ref 1.010–1.025)
pH, UA: 5 (ref 5.0–8.0)

## 2020-11-19 MED ORDER — CEFPROZIL 500 MG PO TABS: 500.0000 mg | ORAL_TABLET | Freq: Two times a day (BID) | ORAL | 0 refills | Status: AC

## 2020-11-19 NOTE — Telephone Encounter (Signed)
Patient was seen yesterday Please talk with family regarding the following Also send for urine culture Urine shows probable infection I recommend starting antibiotics Please touch base with family find out how is Luke Solis doing currently.  Any fever?  Chills?  How is his mental status today?  Any setbacks or problems? I recommend starting Cefzil 500 mg 1 twice daily for the next 10 days  Also urgent consultation with Dr. Rosana Hoes because of difficulty urinating and UTI-Dr. Rosana Hoes is a urologist with [facility]

## 2020-11-19 NOTE — Telephone Encounter (Signed)
Wife notified and verbalized understanding. Wife reports patient seems to be feeling better today and he looks better, mental status is also improved today.

## 2020-11-20 ENCOUNTER — Telehealth: Payer: Self-pay | Admitting: Family Medicine

## 2020-11-20 NOTE — Telephone Encounter (Signed)
[facility] calling to give report on STAT MRI Brain from yesterday.  Moderate chronic ischemic changes Mild generalized atrophy Remote Right basal ganglia infarct   Results are in Epic. Please advise. Thank you

## 2020-11-21 ENCOUNTER — Other Ambulatory Visit: Payer: Self-pay | Admitting: Family Medicine

## 2020-11-21 DIAGNOSIS — E1140 Type 2 diabetes mellitus with diabetic neuropathy, unspecified: Secondary | ICD-10-CM

## 2020-11-21 DIAGNOSIS — I10 Essential (primary) hypertension: Secondary | ICD-10-CM

## 2020-11-21 LAB — URINE CULTURE

## 2020-11-21 NOTE — Telephone Encounter (Signed)
See result note thank you

## 2020-11-24 NOTE — Telephone Encounter (Signed)
Last lipid over one year ago 08/07/19

## 2020-11-25 NOTE — Telephone Encounter (Signed)
May have 6 months refill Please do Q3A metabolic 7 at the end of February start of March please mail him papers regarding this

## 2021-01-07 DIAGNOSIS — L609 Nail disorder, unspecified: Secondary | ICD-10-CM | POA: Diagnosis not present

## 2021-01-07 DIAGNOSIS — M79671 Pain in right foot: Secondary | ICD-10-CM | POA: Diagnosis not present

## 2021-01-07 DIAGNOSIS — M79672 Pain in left foot: Secondary | ICD-10-CM | POA: Diagnosis not present

## 2021-01-07 DIAGNOSIS — E1140 Type 2 diabetes mellitus with diabetic neuropathy, unspecified: Secondary | ICD-10-CM | POA: Diagnosis not present

## 2021-02-02 ENCOUNTER — Other Ambulatory Visit: Payer: Self-pay

## 2021-02-02 ENCOUNTER — Emergency Department (HOSPITAL_COMMUNITY): Payer: Medicare Other

## 2021-02-02 ENCOUNTER — Encounter (HOSPITAL_COMMUNITY): Payer: Self-pay

## 2021-02-02 ENCOUNTER — Emergency Department (HOSPITAL_COMMUNITY)
Admission: EM | Admit: 2021-02-02 | Discharge: 2021-02-02 | Disposition: A | Discharge status: Home / Self Care | Payer: Medicare Other | Attending: Emergency Medicine | Admitting: Emergency Medicine

## 2021-02-02 DIAGNOSIS — E1140 Type 2 diabetes mellitus with diabetic neuropathy, unspecified: Secondary | ICD-10-CM | POA: Insufficient documentation

## 2021-02-02 DIAGNOSIS — Z79899 Other long term (current) drug therapy: Secondary | ICD-10-CM | POA: Diagnosis not present

## 2021-02-02 DIAGNOSIS — Z7984 Long term (current) use of oral hypoglycemic drugs: Secondary | ICD-10-CM | POA: Diagnosis not present

## 2021-02-02 DIAGNOSIS — Z8546 Personal history of malignant neoplasm of prostate: Secondary | ICD-10-CM | POA: Diagnosis not present

## 2021-02-02 DIAGNOSIS — M79601 Pain in right arm: Secondary | ICD-10-CM | POA: Diagnosis not present

## 2021-02-02 DIAGNOSIS — I2510 Atherosclerotic heart disease of native coronary artery without angina pectoris: Secondary | ICD-10-CM | POA: Diagnosis not present

## 2021-02-02 DIAGNOSIS — M7601 Gluteal tendinitis, right hip: Secondary | ICD-10-CM | POA: Insufficient documentation

## 2021-02-02 DIAGNOSIS — Z794 Long term (current) use of insulin: Secondary | ICD-10-CM | POA: Insufficient documentation

## 2021-02-02 DIAGNOSIS — I10 Essential (primary) hypertension: Secondary | ICD-10-CM | POA: Diagnosis not present

## 2021-02-02 DIAGNOSIS — M79641 Pain in right hand: Secondary | ICD-10-CM | POA: Diagnosis not present

## 2021-02-02 LAB — CBG MONITORING, ED: Glucose-Capillary: 188 mg/dL — ABNORMAL HIGH (ref 70–99)

## 2021-02-02 MED ORDER — LIDOCAINE 5 % EX PTCH: 1.0000 | MEDICATED_PATCH | CUTANEOUS | 0 refills | Status: DC

## 2021-02-02 MED ORDER — NAPROXEN 500 MG PO TABS: 500.0000 mg | ORAL_TABLET | Freq: Two times a day (BID) | ORAL | 0 refills | Status: DC

## 2021-02-02 NOTE — Discharge Instructions (Signed)
Take the medications as prescribed.  Follow up with primary care provider within the next few days  Return for any worsening symptoms.

## 2021-02-02 NOTE — ED Triage Notes (Signed)
Pt presents to ED with complaints of right arm pain since this am. Pt states he woke up this am with pain from right shoulder to right hand. NKI

## 2021-02-02 NOTE — ED Provider Notes (Signed)
[facility] EMERGENCY DEPARTMENT Provider Note   CSN: [id number] Arrival date & time: 02/02/21  6144     History Chief Complaint  Patient presents with  . Arm Pain    Luke Solis is a 87 y.o. male with past medical history significant for CVA, NSTEMI, diabetes who presents for evaluation of arm pain.  Patient states he woke up at 430 this morning to use the restroom when he noted right arm pain.  Noted primarily to his right hand however does state that he has a "aching" to his right shoulder.  No recent falls or injuries.  He took 1 dose of Tylenol which states helped his pain.  He has been ambulatory without difficulty.  He denies any headache, lightheadedness, dizziness, neck pain, neck stiffness, paresthesias, weakness, unilateral extremity swelling, redness, warmth, facial droop, difficulty with word finding, chest pain, shortness of breath, abdominal pain.  He has not had anything similar.  He is unsure if he slept "weird."  Denies additional aggravating or alleviating factors.  At normal mentation per wife in room.  Rates his pain a 2/10.  Describes it as a "aching". No hx of DVT, PE.  History obtained from patient, wife Mariea Clonts past medical records.  No interpreter used.  HPI     Past Medical History:  Diagnosis Date  . Coronary atherosclerosis of native coronary artery    BMS proximal and mid RCA as well as BMS circumflex - 2002 (had residual 70% distal LAD), LVEF 60%  . Diabetes mellitus without complication ([place])   . Essential hypertension   . Hyperlipidemia   . Neuropathy   . NSTEMI (non-ST elevated myocardial infarction) ([place])    2002  . Prostate cancer ([place])   . Sleep apnea    On CPAP  . Stroke [facility])     Patient Active Problem List   Diagnosis Date Noted  . Poorly controlled type 2 diabetes mellitus with peripheral neuropathy ([place]) 08/11/2019  . Obstructive sleep apnea treated with continuous positive airway pressure (CPAP) 09/05/2018  . Altered mental status  02/28/2018  . Anemia 02/28/2018  . Fatigue 08/03/2017  . Prostate cancer ([place]) 10/22/2015  . Forgetfulness 04/29/2015  . Type 2 diabetes mellitus with diabetic neuropathy ([place]) 07/17/2013  . Hyperlipidemia 01/19/2010  . OSA (obstructive sleep apnea) 01/19/2010  . Essential hypertension, benign 01/19/2010  . Coronary atherosclerosis of native coronary artery 01/19/2010    Past Surgical History:  Procedure Laterality Date  . COLONOSCOPY    . INGUINAL HERNIA REPAIR    . KNEE SURGERY    . NOSE SURGERY    . PROSTATE SURGERY    . TONSILLECTOMY         Family History  Problem Relation Age of Onset  . Heart attack Mother   . Diabetes Mother   . Coronary artery disease Father   . Hypertension Father   . Heart attack Father   . Stroke Maternal Grandfather   . Lung disease Neg Hx   . Cancer Neg Hx     Social History   Tobacco Use  . Smoking status: Never Smoker  . Smokeless tobacco: Never Used  Vaping Use  . Vaping Use: Never used  Substance Use Topics  . Alcohol use: No    Alcohol/week: 0.0 standard drinks  . Drug use: No    Home Medications Prior to Admission medications   Medication Sig Start Date End Date Taking? Authorizing Provider  lidocaine (LIDODERM) 5 % Place 1 patch onto the skin daily.  Remove & Discard patch within 12 hours or as directed by MD 02/02/21  Yes Henderly, Britni A, PA-C  naproxen (NAPROSYN) 500 MG tablet Take 1 tablet (500 mg total) by mouth 2 (two) times daily. 02/02/21  Yes Henderly, Britni A, PA-C  amLODipine (NORVASC) 2.5 MG tablet TAKE 1 TABLET ONCE DAILY. Patient taking differently: Take 2.5 mg by mouth daily. 09/23/20   Kathyrn Drown, MD  Calcium Carbonate-Vitamin D 600-400 MG-UNIT per tablet Take 1 tablet by mouth daily.    [provider]  fexofenadine (ALLEGRA) 180 MG tablet TAKE 1 TABLET BY MOUTH ONCE DAILY AS NEEDED FOR ALLERGIES. Patient taking differently: Take 180 mg by mouth daily as needed for allergies. 05/07/19    Kathyrn Drown, MD  FORACARE PREMIUM V10 TEST test strip USE AS DIRECTED. 07/20/17   Mikey Kirschner, MD  GLOBAL EASE INJECT PEN NEEDLES 32G X 4 MM MISC USE TO INJECT LANTUS EVERY EVENING. 07/22/20   Kathyrn Drown, MD  insulin glargine (LANTUS SOLOSTAR) 100 UNIT/ML Solostar Pen INJECT 9 UNITS SUBCUTANEOUSLY EVERY EVENING. MAY TITRATE UP TO 10 UNITS Patient taking differently: Inject 9-10 Units into the skin daily. MAY TITRATE UP TO 10 UNITS 11/10/20   Kathyrn Drown, MD  metFORMIN (GLUCOPHAGE) 500 MG tablet TAKE 1 TABLET BY MOUTH DAILY Patient taking differently: Take 500 mg by mouth daily. 03/21/20   Kathyrn Drown, MD  Multiple Vitamin (MULTIVITAMIN) tablet Take 1 tablet by mouth daily.    [provider]  mupirocin ointment (BACTROBAN) 2 % Apply thin amount daily Patient taking differently: Apply 1 application topically daily. 05/30/19   Kathyrn Drown, MD  nitroGLYCERIN (NITROSTAT) 0.4 MG SL tablet PLACE ONE (1) TABLET UNDER TONGUE EVERY 5 MINUTES UP TO (3) DOSES AS NEEDED FOR CHEST PAIN. Patient taking differently: Place 0.4 mg under the tongue every 5 (five) minutes as needed for chest pain. 07/12/19   Kathyrn Drown, MD  pregabalin (LYRICA) 100 MG capsule TAKE 1 CAPSULE IN THE MORNING AND 2 CAPSULES AT BEDTIME. Patient taking differently: Take 100-200 mg by mouth 2 (two) times daily. 1 capsule in the am, 2 capsules at bedtime. 10/21/20   Kathyrn Drown, MD  rosuvastatin (CRESTOR) 40 MG tablet TAKE 1 TABLET ONCE DAILY. Patient taking differently: Take 40 mg by mouth daily. 11/25/20   Kathyrn Drown, MD    Allergies    Eual Fines Marta Lamas oleracea] and Celery oil  Review of Systems   Review of Systems  Constitutional: Negative.   HENT: Negative.   Respiratory: Negative.   Cardiovascular: Negative.   Gastrointestinal: Negative.   Genitourinary: Negative.   Musculoskeletal: Negative for back pain, gait problem, joint swelling, myalgias, neck pain and neck stiffness.        Right arm pain  Skin: Negative.   Neurological: Negative.   All other systems reviewed and are negative.   Physical Exam Updated Vital Signs BP (!) 160/72   Pulse 60   Temp 98.7 F (37.1 C) (Oral)   Resp 16   Ht 6\' 2"  (1.88 m)   Wt 74.8 kg   SpO2 97%   BMI 21.18 kg/m   Physical Exam Vitals and nursing note reviewed.  Constitutional:      General: He is not in acute distress.    Appearance: He is well-developed and well-nourished. He is not ill-appearing, toxic-appearing or diaphoretic.  HENT:     Head: Normocephalic and atraumatic.     Nose: Nose normal.  Mouth/Throat:     Mouth: Mucous membranes are moist.  Eyes:     Pupils: Pupils are equal, round, and reactive to light.  Cardiovascular:     Rate and Rhythm: Normal rate and regular rhythm.     Pulses: Normal pulses.     Heart sounds: Normal heart sounds.  Pulmonary:     Effort: Pulmonary effort is normal. No respiratory distress.     Breath sounds: Normal breath sounds.     Comments: Speaks in full sentences without difficulty.  Lungs clear to auscultation bilaterally Abdominal:     General: Bowel sounds are normal. There is no distension.     Palpations: Abdomen is soft.     Tenderness: There is no abdominal tenderness.  Musculoskeletal:        General: Tenderness present. No swelling, deformity or signs of injury. Normal range of motion.     Cervical back: Normal range of motion and neck supple.     Right lower leg: No edema.     Left lower leg: No edema.     Comments: Tenderness to right hand however not over scaphoid.  No overlying skin changes.  No bony tenderness to forearm, olecranon, humerus, shoulder, scapula or clavicle.  Full range of motion overhead without difficulty.  Negative Hawkins, empty can.  Able to pronate and supinate bilaterally without difficulty.  Skin:    General: Skin is warm and dry.     Capillary Refill: Capillary refill takes less than 2 seconds.  Neurological:     General: No  focal deficit present.     Mental Status: He is alert and oriented to person, place, and time.     Comments: Mental Status:  Alert, oriented, thought content appropriate. Speech fluent without evidence of aphasia. Able to follow 2 step commands without difficulty.  Cranial Nerves:  II:  Peripheral visual fields grossly normal, pupils equal, round, reactive to light III,IV, VI: ptosis not present, extra-ocular motions intact bilaterally  V,VII: smile symmetric, facial light touch sensation equal VIII: hearing grossly normal bilaterally  IX,X: midline uvula rise  XI: bilateral shoulder shrug equal and strong XII: midline tongue extension  Motor:  5/5 in upper and lower extremities bilaterally including strong and equal grip strength and dorsiflexion/plantar flexion Sensory: Pinprick and light touch normal in all extremities.  Deep Tendon Reflexes: 2+ and symmetric  Cerebellar: normal finger-to-nose with bilateral upper extremities Gait: normal gait and balance CV: distal pulses palpable throughout    Psychiatric:        Mood and Affect: Mood and affect normal.     ED Results / Procedures / Treatments   Labs (all labs ordered are listed, but only abnormal results are displayed) Labs Reviewed  CBG MONITORING, ED - Abnormal; Notable for the following components:      Result Value   Glucose-Capillary 188 (*)    All other components within normal limits    EKG EKG Interpretation  Date/Time:  Monday February 02 2021 08:07:02 EST Ventricular Rate:  58 PR Interval:    QRS Duration: 98 QT Interval:  416 QTC Calculation: 409 R Axis:   -46 Text Interpretation: Sinus rhythm Short PR interval Left anterior fascicular block Abnormal R-wave progression, early transition Baseline wander in lead(s) V1 V4 NSR,  no change from previous Confirmed by Lavenia Atlas [phone]) on 02/02/2021 9:42:13 AM   Radiology DG Hand Complete Right  Result Date: 02/02/2021 CLINICAL DATA:  Right hand pain  EXAM: RIGHT HAND - COMPLETE 3+ VIEW COMPARISON:  None. FINDINGS: Early joint space narrowing within the IP joints and mid carpal region between the scaphoid, trapezium and trapezoid. Chondrocalcinosis in the triangular fibrocartilage. No acute bony abnormality. Specifically, no fracture, subluxation, or dislocation. No bony erosions. IMPRESSION: Mild degenerative changes in the hand and wrist as above. No acute bony abnormality. Electronically Signed   By: Rolm Baptise M.D.   On: 02/02/2021 10:48   Procedures Procedures   Medications Ordered in ED Medications - No data to display  ED Course  I have reviewed the triage vital signs and the nursing notes.  Pertinent labs & imaging results that were available during my care of the patient were reviewed by me and considered in my medical decision making (see chart for details).  87 year old presents for evaluation of right arm pain.  Primarily located to his right hand.  Afebrile, nonseptic, not ill-appearing.  His heart and lungs are clear.  He has no upper respiratory complaints, no chest pain or shortness of breath.  EKG without ischemic changes.  I have low suspicion for ACS, PE, dissection, pneumothorax.  He has no neck stiffness or neck rigidity.  Low suspicion for meningitis, no midline spinal tenderness to suggest radicular pain. He has a nonfocal neuro exam without deficits. I have low suspicion for CVA, neurologic etiology of symptoms.  Does have some bony tenderness to his right hand however this is nonfocal.  His x-ray did not show any evidence of acute fracture, dislocation or effusion.  He has no tenderness over his scaphoid.  I have low suspicion for occult fracture, dislocation, septic joint, myositis or rhabdomyolysis.  He has no bony tenderness to his forearm, humerus, shoulder, clavicle or olecranon.  He has negative Hawkins, empty can test.  He has no edema, erythema, warmth or overlying skin changes to suggest cellulitis, VTE.  Symptoms  do not seem consistent with gout.    Patient reassessed.  He currently has no pain.  He continues to be neurologically intact.  Discussed labs and imaging with patient and wife in room. Unclear etiology of symptoms.  Will treat with anti-inflammatories, lidocaine patches and close follow-up with PCP.  The patient has been appropriately medically screened and/or stabilized in the ED. I have low suspicion for any other emergent medical condition which would require further screening, evaluation or treatment in the ED or require inpatient management.  Patient is hemodynamically stable and in no acute distress.  Patient able to ambulate in department prior to ED.  Evaluation does not show acute pathology that would require ongoing or additional emergent interventions while in the emergency department or further inpatient treatment.  I have discussed the diagnosis with the patient and answered all questions.  Pain is been managed while in the emergency department and patient has no further complaints prior to discharge.  Patient is comfortable with plan discussed in room and is stable for discharge at this time.  I have discussed strict return precautions for returning to the emergency department.  Patient was encouraged to follow-up with PCP/specialist refer to at discharge.    Patient seen eval by attending, Dr. Dina Rich who agrees with above treatment, plan and disposition.    MDM Rules/Calculators/A&P                           Final Clinical Impression(s) / ED Diagnoses Final diagnoses:  Right arm pain    Rx / DC Orders ED Discharge Orders  Ordered    naproxen (NAPROSYN) 500 MG tablet  2 times daily        02/02/21 1200    lidocaine (LIDODERM) 5 %  Every 24 hours        02/02/21 1200           Henderly, Britni A, PA-C 02/02/21 1213    Horton, Alvin Critchley, DO 02/02/21 1933

## 2021-02-02 NOTE — ED Notes (Signed)
Unable to obtain d/c signature due to computer not working.  Pt and pt's wife verbalized understanding of d/c instructions.

## 2021-02-03 ENCOUNTER — Telehealth: Payer: Self-pay | Admitting: *Deleted

## 2021-02-03 NOTE — Telephone Encounter (Signed)
Transition Care Management Unsuccessful Follow-up Telephone Call  Date of discharge and from where:  02/02/2021 - [facility]  Attempts:  1st Attempt  Reason for unsuccessful TCM follow-up call:  Unable to reach patient

## 2021-02-04 NOTE — Telephone Encounter (Signed)
Transition Care Management Follow-up Telephone Call  Date of discharge and from where: 02/02/2021 - [facility]  How have you been since you were released from the hospital? "Still having some pain but it has improved a little"  Any questions or concerns? No  Items Reviewed:  Did the pt receive and understand the discharge instructions provided? Yes   Medications obtained and verified? Yes   Other? No   Any new allergies since your discharge? No   Dietary orders reviewed? Yes  Do you have support at home? Yes   Home Care and Equipment/Supplies: Were home health services ordered? not applicable If so, what is the name of the agency? N/A  Has the agency set up a time to come to the patient's home? not applicable Were any new equipment or medical supplies ordered?  No What is the name of the medical supply agency? N/A Were you able to get the supplies/equipment? not applicable Do you have any questions related to the use of the equipment or supplies? No  Functional Questionnaire: (I = Independent and D = Dependent) ADLs: I  Bathing/Dressing- I  Meal Prep- I  Eating- I  Maintaining continence- I  Transferring/Ambulation- I  Managing Meds- I  Follow up appointments reviewed:   PCP Hospital f/u appt confirmed? No    Specialist Hospital f/u appt confirmed? No    Are transportation arrangements needed? No   If their condition worsens, is the pt aware to call PCP or go to the Emergency Dept.? Yes  Was the patient provided with contact information for the PCP's office or ED? Yes  Was to pt encouraged to call back with questions or concerns? Yes

## 2021-02-13 ENCOUNTER — Other Ambulatory Visit: Payer: Self-pay | Admitting: Family Medicine

## 2021-02-24 ENCOUNTER — Telehealth: Payer: Self-pay | Admitting: Family Medicine

## 2021-02-24 DIAGNOSIS — E7849 Other hyperlipidemia: Secondary | ICD-10-CM

## 2021-02-24 DIAGNOSIS — I10 Essential (primary) hypertension: Secondary | ICD-10-CM

## 2021-02-24 DIAGNOSIS — Z79899 Other long term (current) drug therapy: Secondary | ICD-10-CM

## 2021-02-24 DIAGNOSIS — E1140 Type 2 diabetes mellitus with diabetic neuropathy, unspecified: Secondary | ICD-10-CM

## 2021-02-24 NOTE — Telephone Encounter (Signed)
Pt has active labs form 11/25/20 BMET, A1C and urine culture. Please advise. Thank you.

## 2021-02-24 NOTE — Telephone Encounter (Signed)
Lab orders placed and wife (DPR) is aware. Wife transferred up front to schedule follow up for pt

## 2021-02-24 NOTE — Telephone Encounter (Signed)
Patient requesting labs for his follow up. Please advise

## 2021-02-24 NOTE — Telephone Encounter (Signed)
A1c, lipid, liver, met 7, urine ACR

## 2021-02-25 ENCOUNTER — Other Ambulatory Visit: Payer: Self-pay | Admitting: Family Medicine

## 2021-03-25 ENCOUNTER — Other Ambulatory Visit: Payer: Self-pay | Admitting: Podiatry

## 2021-03-25 DIAGNOSIS — L609 Nail disorder, unspecified: Secondary | ICD-10-CM | POA: Diagnosis not present

## 2021-03-25 DIAGNOSIS — Z79899 Other long term (current) drug therapy: Secondary | ICD-10-CM | POA: Diagnosis not present

## 2021-03-25 DIAGNOSIS — M79672 Pain in left foot: Secondary | ICD-10-CM | POA: Diagnosis not present

## 2021-03-25 DIAGNOSIS — I10 Essential (primary) hypertension: Secondary | ICD-10-CM | POA: Diagnosis not present

## 2021-03-25 DIAGNOSIS — E7849 Other hyperlipidemia: Secondary | ICD-10-CM | POA: Diagnosis not present

## 2021-03-25 DIAGNOSIS — M79671 Pain in right foot: Secondary | ICD-10-CM | POA: Diagnosis not present

## 2021-03-25 DIAGNOSIS — E1140 Type 2 diabetes mellitus with diabetic neuropathy, unspecified: Secondary | ICD-10-CM | POA: Diagnosis not present

## 2021-03-26 ENCOUNTER — Other Ambulatory Visit: Payer: Self-pay

## 2021-03-26 ENCOUNTER — Other Ambulatory Visit: Payer: Self-pay | Admitting: Family Medicine

## 2021-03-26 ENCOUNTER — Ambulatory Visit (INDEPENDENT_AMBULATORY_CARE_PROVIDER_SITE_OTHER): Payer: Medicare Other | Admitting: Family Medicine

## 2021-03-26 ENCOUNTER — Ambulatory Visit (HOSPITAL_COMMUNITY)
Admission: RE | Admit: 2021-03-26 | Discharge: 2021-03-26 | Disposition: A | Discharge status: Home / Self Care | Payer: Medicare Other | Source: Ambulatory Visit | Attending: Podiatry | Admitting: Podiatry

## 2021-03-26 ENCOUNTER — Encounter: Payer: Self-pay | Admitting: Family Medicine

## 2021-03-26 VITALS — BP 122/70 | HR 76 | Temp 97.8°F | Wt 161.6 lb

## 2021-03-26 DIAGNOSIS — N1831 Chronic kidney disease, stage 3a: Secondary | ICD-10-CM | POA: Diagnosis not present

## 2021-03-26 DIAGNOSIS — E1140 Type 2 diabetes mellitus with diabetic neuropathy, unspecified: Secondary | ICD-10-CM

## 2021-03-26 DIAGNOSIS — M79671 Pain in right foot: Secondary | ICD-10-CM | POA: Insufficient documentation

## 2021-03-26 DIAGNOSIS — Z794 Long term (current) use of insulin: Secondary | ICD-10-CM

## 2021-03-26 DIAGNOSIS — N3941 Urge incontinence: Secondary | ICD-10-CM | POA: Diagnosis not present

## 2021-03-26 DIAGNOSIS — N289 Disorder of kidney and ureter, unspecified: Secondary | ICD-10-CM | POA: Diagnosis not present

## 2021-03-26 DIAGNOSIS — R972 Elevated prostate specific antigen [PSA]: Secondary | ICD-10-CM | POA: Diagnosis not present

## 2021-03-26 DIAGNOSIS — E1122 Type 2 diabetes mellitus with diabetic chronic kidney disease: Secondary | ICD-10-CM | POA: Diagnosis not present

## 2021-03-26 DIAGNOSIS — M79604 Pain in right leg: Secondary | ICD-10-CM | POA: Diagnosis not present

## 2021-03-26 DIAGNOSIS — R8290 Unspecified abnormal findings in urine: Secondary | ICD-10-CM | POA: Diagnosis not present

## 2021-03-26 DIAGNOSIS — N35014 Post-traumatic urethral stricture, male, unspecified: Secondary | ICD-10-CM | POA: Diagnosis not present

## 2021-03-26 DIAGNOSIS — N320 Bladder-neck obstruction: Secondary | ICD-10-CM | POA: Diagnosis not present

## 2021-03-26 DIAGNOSIS — Z8546 Personal history of malignant neoplasm of prostate: Secondary | ICD-10-CM | POA: Diagnosis not present

## 2021-03-26 DIAGNOSIS — N3040 Irradiation cystitis without hematuria: Secondary | ICD-10-CM | POA: Diagnosis not present

## 2021-03-26 DIAGNOSIS — M79661 Pain in right lower leg: Secondary | ICD-10-CM | POA: Diagnosis not present

## 2021-03-26 DIAGNOSIS — N35919 Unspecified urethral stricture, male, unspecified site: Secondary | ICD-10-CM | POA: Diagnosis not present

## 2021-03-26 DIAGNOSIS — C61 Malignant neoplasm of prostate: Secondary | ICD-10-CM | POA: Diagnosis not present

## 2021-03-26 DIAGNOSIS — N429 Disorder of prostate, unspecified: Secondary | ICD-10-CM | POA: Diagnosis not present

## 2021-03-26 DIAGNOSIS — R590 Localized enlarged lymph nodes: Secondary | ICD-10-CM | POA: Diagnosis not present

## 2021-03-26 LAB — BASIC METABOLIC PANEL
BUN/Creatinine Ratio: 14 (ref 10–24)
BUN: 22 mg/dL (ref 8–27)
CO2: 24 mmol/L (ref 20–29)
Calcium: 9.4 mg/dL (ref 8.6–10.2)
Chloride: 101 mmol/L (ref 96–106)
Creatinine, Ser: 1.62 mg/dL — ABNORMAL HIGH (ref 0.76–1.27)
Glucose: 199 mg/dL — ABNORMAL HIGH (ref 65–99)
Potassium: 3.8 mmol/L (ref 3.5–5.2)
Sodium: 143 mmol/L (ref 134–144)
eGFR: 41 mL/min/{1.73_m2} — ABNORMAL LOW (ref >59)

## 2021-03-26 LAB — LIPID PANEL
Chol/HDL Ratio: 3.4 ratio (ref 0.0–5.0)
Cholesterol, Total: 168 mg/dL (ref 100–199)
HDL: 49 mg/dL (ref >39)
LDL Chol Calc (NIH): 101 mg/dL — ABNORMAL HIGH (ref 0–99)
Triglycerides: 98 mg/dL (ref 0–149)
VLDL Cholesterol Cal: 18 mg/dL (ref 5–40)

## 2021-03-26 LAB — HEPATIC FUNCTION PANEL
ALT: 10 IU/L (ref 0–44)
AST: 10 IU/L (ref 0–40)
Albumin: 4.4 g/dL (ref 3.6–4.6)
Alkaline Phosphatase: 83 IU/L (ref 44–121)
Bilirubin Total: 0.3 mg/dL (ref 0.0–1.2)
Bilirubin, Direct: 0.11 mg/dL (ref 0.00–0.40)
Total Protein: 7.5 g/dL (ref 6.0–8.5)

## 2021-03-26 LAB — MICROALBUMIN / CREATININE URINE RATIO
Creatinine, Urine: 100.7 mg/dL
Microalb/Creat Ratio: 301 mg/g creat — ABNORMAL HIGH (ref 0–29)
Microalbumin, Urine: 303.2 ug/mL

## 2021-03-26 LAB — HEMOGLOBIN A1C
Est. average glucose Bld gHb Est-mCnc: 266 mg/dL
Hgb A1c MFr Bld: 10.9 % — ABNORMAL HIGH (ref 4.8–5.6)

## 2021-03-26 NOTE — Progress Notes (Signed)
Subjective:    Patient ID: Luke Solis, male    DOB: 20-Nov-1931, 87 y.o.   MRN: [id number]  HPI Pt went to podiatry yesterday and foot dr wanted to do an ultrasound to rule out DVT.  Podiatry was concerned about the possibility of blood clots but he has not had any swelling he is just having a throbbing ache in both calves that sometimes come when he is at rest sometimes with activity will last anywhere from 5 to 30 minutes been coming off and on over the past 3 to 4 weeks Patient's diabetes not under good control with recent lab work showing significant elevation of A1c patient does not check his sugars on a regular basis He denies any chest tightness pressure pain or shortness of breath   Pain in both calves of legs. When first waking up is worst and sometimes at night per wife. When showering, the pain gets better. Pain comes and goes. Has started a few weeks ago. No swelling.    Review of Systems     Objective:   Physical Exam  Lungs clear respiratory rate normal heart regular pulse normal extremities diminished pulses in the feet calves are nontender there is no swelling in the calf.  Lab work was reviewed. Results for orders placed or performed in visit on 02/24/21  Hemoglobin A1c  Result Value Ref Range   Hgb A1c MFr Bld 10.9 (H) 4.8 - 5.6 %   Est. average glucose Bld gHb Est-mCnc 266 mg/dL  Lipid Profile  Result Value Ref Range   Cholesterol, Total 168 100 - 199 mg/dL   Triglycerides 98 0 - 149 mg/dL   HDL 49 >39 mg/dL   VLDL Cholesterol Cal 18 5 - 40 mg/dL   LDL Chol Calc (NIH) 101 (H) 0 - 99 mg/dL   Chol/HDL Ratio 3.4 0.0 - 5.0 ratio  Hepatic function panel  Result Value Ref Range   Total Protein 7.5 6.0 - 8.5 g/dL   Albumin 4.4 3.6 - 4.6 g/dL   Bilirubin Total 0.3 0.0 - 1.2 mg/dL   Bilirubin, Direct 0.11 0.00 - 0.40 mg/dL   Alkaline Phosphatase 83 44 - 121 IU/L   AST 10 0 - 40 IU/L   ALT 10 0 - 44 IU/L  Basic Metabolic Panel (BMET)  Result Value Ref Range    Glucose 199 (H) 65 - 99 mg/dL   BUN 22 8 - 27 mg/dL   Creatinine, Ser 1.62 (H) 0.76 - 1.27 mg/dL   eGFR 41 (L) >59 mL/min/1.73   BUN/Creatinine Ratio 14 10 - 24   Sodium 143 134 - 144 mmol/L   Potassium 3.8 3.5 - 5.2 mmol/L   Chloride 101 96 - 106 mmol/L   CO2 24 20 - 29 mmol/L   Calcium 9.4 8.6 - 10.2 mg/dL  Urine Microalbumin w/creat. ratio  Result Value Ref Range   Creatinine, Urine 100.7 Not Estab. mg/dL   Microalbumin, Urine 303.2 Not Estab. ug/mL   Microalb/Creat Ratio 301 (H) 0 - 29 mg/g creat         Assessment & Plan:  1. Type 2 diabetes mellitus with diabetic neuropathy, without long-term current use of insulin (HCC) Very important for the patient to record his readings on a regular basis recheck labs in 3 months send [place] some readings every 2 weeks we will need to adjust his insulin We will also entertain the possibility of adding medication like Farxiga - Hemoglobin V9T - Basic Metabolic Panel (BMET)  2.  Renal insufficiency Please see discussion above and below - Hemoglobin L8H - Basic Metabolic Panel (BMET)  3. Type 2 diabetes mellitus with stage 3a chronic kidney disease, with long-term current use of insulin (HCC) Consideration for SGLT2 await some of his readings over the next 2 weeks in addition to this I doubt that he has a DVT but he will be getting ultrasound we will monitor for the results and notify the patient

## 2021-03-31 ENCOUNTER — Inpatient Hospital Stay: Payer: Medicare Other | Admitting: Family Medicine

## 2021-04-20 DIAGNOSIS — Z961 Presence of intraocular lens: Secondary | ICD-10-CM | POA: Diagnosis not present

## 2021-04-20 DIAGNOSIS — H43813 Vitreous degeneration, bilateral: Secondary | ICD-10-CM | POA: Diagnosis not present

## 2021-04-20 DIAGNOSIS — H02834 Dermatochalasis of left upper eyelid: Secondary | ICD-10-CM | POA: Diagnosis not present

## 2021-04-20 DIAGNOSIS — E119 Type 2 diabetes mellitus without complications: Secondary | ICD-10-CM | POA: Diagnosis not present

## 2021-04-20 DIAGNOSIS — H02831 Dermatochalasis of right upper eyelid: Secondary | ICD-10-CM | POA: Diagnosis not present

## 2021-04-20 DIAGNOSIS — H26492 Other secondary cataract, left eye: Secondary | ICD-10-CM | POA: Diagnosis not present

## 2021-04-20 DIAGNOSIS — H40013 Open angle with borderline findings, low risk, bilateral: Secondary | ICD-10-CM | POA: Diagnosis not present

## 2021-04-20 LAB — HM DIABETES EYE EXAM

## 2021-04-28 ENCOUNTER — Other Ambulatory Visit: Payer: Self-pay

## 2021-04-28 ENCOUNTER — Encounter: Payer: Self-pay | Admitting: Family Medicine

## 2021-04-28 ENCOUNTER — Ambulatory Visit (INDEPENDENT_AMBULATORY_CARE_PROVIDER_SITE_OTHER): Payer: Medicare Other | Admitting: Family Medicine

## 2021-04-28 VITALS — BP 138/78 | Temp 97.3°F | Wt 158.4 lb

## 2021-04-28 DIAGNOSIS — R300 Dysuria: Secondary | ICD-10-CM | POA: Diagnosis not present

## 2021-04-28 DIAGNOSIS — C61 Malignant neoplasm of prostate: Secondary | ICD-10-CM | POA: Diagnosis not present

## 2021-04-28 DIAGNOSIS — N289 Disorder of kidney and ureter, unspecified: Secondary | ICD-10-CM | POA: Diagnosis not present

## 2021-04-28 LAB — POCT URINALYSIS DIPSTICK
Blood, UA: POSITIVE
Glucose, UA: POSITIVE — AB
Nitrite, UA: NEGATIVE
Protein, UA: NEGATIVE
pH, UA: 5 (ref 5.0–8.0)

## 2021-04-28 MED ORDER — CEPHALEXIN 500 MG PO CAPS: 500.0000 mg | ORAL_CAPSULE | Freq: Four times a day (QID) | ORAL | 0 refills | Status: DC

## 2021-04-28 NOTE — Progress Notes (Signed)
   Subjective:    Patient ID: Luke Solis, male    DOB: 05-17-1931, 87 y.o.   MRN: [id number]  HPI Pt having pain in penis area along with feeling like he has to go but cant. Pt is unable to fully go to bathroom; per wife "it kind of seeps". Pt did call urologist and they could see him Thursday but pt wife states she knew he needed to be seen sooner. Pain today is less than last night. Tylenol did not help so wife gave oxycodone which helped him sleep 2 hours. Yesterday pt felt terrible.  (Pt unable to obtain urine specimen at this time) Patient states that over the past several weeks urine just sort of comes out the urethra he wears depends 24/7 recently he has had bowel movements occur that he cannot sense that its going to happen or potentially it does happen and he cannot stop it  He denies bloody stools.  Denies abdominal pain or pelvic pain.  Does relate urinary discomfort Also brings in glucose readings which many of them are morning sugars that are in the 140- 200 range and a couple readings later in the day were over 200 Currently only taking 10 units of insulin Is taking metformin just 1/day Review of Systems     Objective:   Physical Exam  Lungs clear heart regular pulse normal BP good weight on the low side In the urethra very red nontender penis.  Pelvic region nontender no swelling no sign of any type of gangrene abdomen is soft no guarding rebound or tenderness rectal exam is normal with enlarged prostate patient has had previous seeds Rectal exam no impaction noted    Assessment & Plan:  1. Dysuria Sent for culture Probable UTI Keflex 4 times daily for 10 days Warning signs discussed If high fevers severe chills or worse go to ER I do not feel that the patient has enlarged bladder more than likely just has absolutely no control after having prostate cancer - POCT Urinalysis Dipstick - Urine Culture  2. Renal insufficiency Check kidney function may need to stop  metformin depending on GFR - Basic Metabolic Panel (BMET)  3. Prostate cancer [facility]) Patient has an appointment with urologist in 2 days antibiotics prescribed if high fever severe pain or worse go to ER  Diabetes subpar control increase insulin to 12 units once a week can go up by 2 units until morning readings are in the 100-1 30 range avoid low sugars warning signs were discussed

## 2021-04-29 ENCOUNTER — Other Ambulatory Visit: Payer: Self-pay | Admitting: Family Medicine

## 2021-04-29 LAB — BASIC METABOLIC PANEL
BUN/Creatinine Ratio: 16 (ref 10–24)
BUN: 26 mg/dL (ref 8–27)
CO2: 21 mmol/L (ref 20–29)
Calcium: 9 mg/dL (ref 8.6–10.2)
Chloride: 98 mmol/L (ref 96–106)
Creatinine, Ser: 1.61 mg/dL — ABNORMAL HIGH (ref 0.76–1.27)
Glucose: 291 mg/dL — ABNORMAL HIGH (ref 65–99)
Potassium: 4.3 mmol/L (ref 3.5–5.2)
Sodium: 139 mmol/L (ref 134–144)
eGFR: 41 mL/min/{1.73_m2} — ABNORMAL LOW (ref >59)

## 2021-04-29 NOTE — Telephone Encounter (Signed)
Seen yesterday

## 2021-04-29 NOTE — Telephone Encounter (Signed)
Please verify that the patient is taking this and it is affordable hopefully I do not recall prescribing this particular medicine but if the patient is taking this may have 6 refills

## 2021-04-30 DIAGNOSIS — C61 Malignant neoplasm of prostate: Secondary | ICD-10-CM | POA: Diagnosis not present

## 2021-04-30 DIAGNOSIS — N35012 Post-traumatic membranous urethral stricture: Secondary | ICD-10-CM | POA: Diagnosis not present

## 2021-04-30 DIAGNOSIS — N3040 Irradiation cystitis without hematuria: Secondary | ICD-10-CM | POA: Diagnosis not present

## 2021-04-30 DIAGNOSIS — N35014 Post-traumatic urethral stricture, male, unspecified: Secondary | ICD-10-CM | POA: Diagnosis not present

## 2021-04-30 DIAGNOSIS — N3000 Acute cystitis without hematuria: Secondary | ICD-10-CM | POA: Diagnosis not present

## 2021-04-30 NOTE — Telephone Encounter (Signed)
Left message to return call 

## 2021-05-01 LAB — URINE CULTURE

## 2021-05-01 LAB — HM DIABETES EYE EXAM

## 2021-05-01 LAB — SPECIMEN STATUS REPORT

## 2021-05-05 ENCOUNTER — Telehealth: Payer: Self-pay | Admitting: Family Medicine

## 2021-05-05 NOTE — Telephone Encounter (Signed)
Luke Drown, MD  05/01/2021 9:28 AM EDT Back to Top     Urine culture showed infection patient is on antibiotics. Patient is following through with urology. He has a follow-up appointment with [place] later in June

## 2021-05-05 NOTE — Telephone Encounter (Signed)
Telephone call no answer 

## 2021-05-05 NOTE — Telephone Encounter (Signed)
Patient would like results of culture

## 2021-05-07 NOTE — Telephone Encounter (Signed)
Left message to return call 

## 2021-05-08 ENCOUNTER — Other Ambulatory Visit: Payer: Self-pay | Admitting: Family Medicine

## 2021-05-08 ENCOUNTER — Other Ambulatory Visit: Payer: Self-pay | Admitting: *Deleted

## 2021-05-08 MED ORDER — PREGABALIN 100 MG PO CAPS: ORAL_CAPSULE | ORAL | 5 refills | Status: DC

## 2021-05-08 MED ORDER — CEPHALEXIN 500 MG PO CAPS: 500.0000 mg | ORAL_CAPSULE | Freq: Four times a day (QID) | ORAL | 0 refills | Status: DC

## 2021-05-08 NOTE — Telephone Encounter (Signed)
Increase insulin to 14 units each evening We will check regarding his neuropathy medicine and send in refill

## 2021-05-08 NOTE — Telephone Encounter (Signed)
Discussed with pt's wife. She states sugar readings are 156, 186, 169, 146, 186. He is taking 12 units of lantus. She is aware refill of antibiotic was sent to pharm. She wanted to know if he was suppose to get refill on med for neuropathy. She did not know of the name of med. She said kevin at [facility] sent a message for refill.

## 2021-05-08 NOTE — Telephone Encounter (Signed)
Pt wife returned call. Pt is feeling better but still having pain. Antibiotic runs out today. Pt was awoken at 3 am this morning due to pain. Sat in his chair all night because it felt better. Wife wanting to know if pt needs a refill of antibiotic. Please advise. Thank you.

## 2021-05-08 NOTE — Telephone Encounter (Signed)
Pt wife returned call. Spoke with patient. Pt does not have this med on his list.

## 2021-05-08 NOTE — Telephone Encounter (Signed)
Tried to call no answer

## 2021-05-08 NOTE — Telephone Encounter (Signed)
1.  May refill the antibiotic 2.  How are the  glucose readings doing?  How much insulin are you currently taking?

## 2021-05-09 NOTE — Telephone Encounter (Signed)
Then we will not be filling this medicine thank you

## 2021-05-12 NOTE — Telephone Encounter (Signed)
Pt wife contacted and verbalized understanding.  

## 2021-05-18 ENCOUNTER — Other Ambulatory Visit: Payer: Self-pay | Admitting: Family Medicine

## 2021-05-19 ENCOUNTER — Other Ambulatory Visit: Payer: Self-pay | Admitting: Family Medicine

## 2021-05-26 ENCOUNTER — Ambulatory Visit: Payer: Medicare Other | Admitting: Family Medicine

## 2021-05-28 ENCOUNTER — Encounter: Payer: Self-pay | Admitting: Family Medicine

## 2021-06-03 ENCOUNTER — Other Ambulatory Visit: Payer: Self-pay | Admitting: Family Medicine

## 2021-06-03 NOTE — Telephone Encounter (Signed)
Please verify the amount that he is utilizing.  Then if it is around 9 or 10 may titrate up to 20 as directed by physician with 6 refills

## 2021-06-05 ENCOUNTER — Encounter: Payer: Self-pay | Admitting: Family Medicine

## 2021-06-05 ENCOUNTER — Other Ambulatory Visit: Payer: Self-pay

## 2021-06-05 ENCOUNTER — Ambulatory Visit (INDEPENDENT_AMBULATORY_CARE_PROVIDER_SITE_OTHER): Payer: Medicare Other | Admitting: Family Medicine

## 2021-06-05 ENCOUNTER — Ambulatory Visit (HOSPITAL_COMMUNITY)
Admission: RE | Admit: 2021-06-05 | Discharge: 2021-06-05 | Disposition: A | Discharge status: Home / Self Care | Payer: Medicare Other | Source: Ambulatory Visit | Attending: Family Medicine | Admitting: Family Medicine

## 2021-06-05 ENCOUNTER — Other Ambulatory Visit (HOSPITAL_COMMUNITY): Payer: Self-pay | Admitting: Family Medicine

## 2021-06-05 VITALS — BP 121/69 | HR 86 | Temp 98.5°F

## 2021-06-05 DIAGNOSIS — K6289 Other specified diseases of anus and rectum: Secondary | ICD-10-CM | POA: Diagnosis not present

## 2021-06-05 DIAGNOSIS — E1122 Type 2 diabetes mellitus with diabetic chronic kidney disease: Secondary | ICD-10-CM | POA: Diagnosis not present

## 2021-06-05 DIAGNOSIS — B377 Candidal sepsis: Secondary | ICD-10-CM | POA: Diagnosis not present

## 2021-06-05 DIAGNOSIS — R339 Retention of urine, unspecified: Secondary | ICD-10-CM | POA: Insufficient documentation

## 2021-06-05 DIAGNOSIS — Z20822 Contact with and (suspected) exposure to covid-19: Secondary | ICD-10-CM | POA: Diagnosis not present

## 2021-06-05 DIAGNOSIS — N3090 Cystitis, unspecified without hematuria: Secondary | ICD-10-CM | POA: Diagnosis not present

## 2021-06-05 DIAGNOSIS — B3749 Other urogenital candidiasis: Secondary | ICD-10-CM | POA: Diagnosis not present

## 2021-06-05 DIAGNOSIS — I700 Atherosclerosis of aorta: Secondary | ICD-10-CM | POA: Diagnosis not present

## 2021-06-05 DIAGNOSIS — R509 Fever, unspecified: Secondary | ICD-10-CM | POA: Diagnosis not present

## 2021-06-05 DIAGNOSIS — R159 Full incontinence of feces: Secondary | ICD-10-CM

## 2021-06-05 DIAGNOSIS — G9341 Metabolic encephalopathy: Secondary | ICD-10-CM | POA: Diagnosis not present

## 2021-06-05 DIAGNOSIS — N3000 Acute cystitis without hematuria: Secondary | ICD-10-CM | POA: Diagnosis not present

## 2021-06-05 DIAGNOSIS — R102 Pelvic and perineal pain: Secondary | ICD-10-CM | POA: Insufficient documentation

## 2021-06-05 DIAGNOSIS — T83518A Infection and inflammatory reaction due to other urinary catheter, initial encounter: Secondary | ICD-10-CM | POA: Diagnosis not present

## 2021-06-05 DIAGNOSIS — Z87440 Personal history of urinary (tract) infections: Secondary | ICD-10-CM | POA: Diagnosis not present

## 2021-06-05 NOTE — Telephone Encounter (Signed)
Please rewrite the prescription reflecting utilizing 12 units may titrate up to 24 units may have 6 months

## 2021-06-05 NOTE — Progress Notes (Signed)
   Subjective:    Patient ID: Luke Solis, male    DOB: May 02, 1931, 87 y.o.   MRN: [id number]  HPI Patient relates lower abdominal pain relates pelvic pain relates difficulty urinating Also having some intermittent bowel movements outside of his control which just releases on him He does have a history of prostate cancer and is under the care of urology through [facility] Denies any fever chills sweats Sugars have been okay he will write down some readings and send them to [place]  Penile pain    Review of Systems     Objective:   Physical Exam Lungs are clear heart is regular abdomen lower abdomen is firm and somewhat tender.       Assessment & Plan:  1. Urinary retention Ultrasound shows larger retention needs immediate relief.  Referral to [facility] ER for further evaluation catheter under the care of urology who works through [facility] has history of prostate cancer no active sign of infection currently - US Pelvis Limited  2. Pelvic pain This Is Due To the Urinary Retention Should Get Better If They Are Able to Place a catheter - US Pelvis Limited  Encopresis Has poor rectal tone We will touch base with gastroenterology to see if there is any type of testing we should do or if this is just a natural consequence given his prostate cancer issues

## 2021-06-08 ENCOUNTER — Telehealth: Payer: Self-pay | Admitting: Family Medicine

## 2021-06-08 ENCOUNTER — Encounter (HOSPITAL_COMMUNITY): Payer: Self-pay | Admitting: *Deleted

## 2021-06-08 ENCOUNTER — Other Ambulatory Visit: Payer: Self-pay

## 2021-06-08 ENCOUNTER — Emergency Department (HOSPITAL_COMMUNITY): Payer: Medicare Other

## 2021-06-08 ENCOUNTER — Inpatient Hospital Stay (HOSPITAL_COMMUNITY)
Admission: EM | Admit: 2021-06-08 | Discharge: 2021-06-13 | DRG: 698 | Disposition: A | Discharge status: Home Health | Payer: Medicare Other | Attending: Family Medicine | Admitting: Family Medicine

## 2021-06-08 DIAGNOSIS — B377 Candidal sepsis: Secondary | ICD-10-CM | POA: Diagnosis present

## 2021-06-08 DIAGNOSIS — Z833 Family history of diabetes mellitus: Secondary | ICD-10-CM | POA: Diagnosis not present

## 2021-06-08 DIAGNOSIS — G4733 Obstructive sleep apnea (adult) (pediatric): Secondary | ICD-10-CM | POA: Diagnosis present

## 2021-06-08 DIAGNOSIS — B3749 Other urogenital candidiasis: Secondary | ICD-10-CM | POA: Diagnosis present

## 2021-06-08 DIAGNOSIS — E1165 Type 2 diabetes mellitus with hyperglycemia: Secondary | ICD-10-CM | POA: Diagnosis present

## 2021-06-08 DIAGNOSIS — R509 Fever, unspecified: Secondary | ICD-10-CM | POA: Diagnosis not present

## 2021-06-08 DIAGNOSIS — Z823 Family history of stroke: Secondary | ICD-10-CM

## 2021-06-08 DIAGNOSIS — N35919 Unspecified urethral stricture, male, unspecified site: Secondary | ICD-10-CM | POA: Diagnosis present

## 2021-06-08 DIAGNOSIS — T83518A Infection and inflammatory reaction due to other urinary catheter, initial encounter: Secondary | ICD-10-CM | POA: Diagnosis present

## 2021-06-08 DIAGNOSIS — I129 Hypertensive chronic kidney disease with stage 1 through stage 4 chronic kidney disease, or unspecified chronic kidney disease: Secondary | ICD-10-CM | POA: Diagnosis present

## 2021-06-08 DIAGNOSIS — E1122 Type 2 diabetes mellitus with diabetic chronic kidney disease: Secondary | ICD-10-CM | POA: Diagnosis present

## 2021-06-08 DIAGNOSIS — G9341 Metabolic encephalopathy: Secondary | ICD-10-CM | POA: Diagnosis present

## 2021-06-08 DIAGNOSIS — Z8546 Personal history of malignant neoplasm of prostate: Secondary | ICD-10-CM

## 2021-06-08 DIAGNOSIS — E785 Hyperlipidemia, unspecified: Secondary | ICD-10-CM | POA: Diagnosis present

## 2021-06-08 DIAGNOSIS — E1142 Type 2 diabetes mellitus with diabetic polyneuropathy: Secondary | ICD-10-CM | POA: Diagnosis present

## 2021-06-08 DIAGNOSIS — A419 Sepsis, unspecified organism: Secondary | ICD-10-CM

## 2021-06-08 DIAGNOSIS — E876 Hypokalemia: Secondary | ICD-10-CM | POA: Diagnosis present

## 2021-06-08 DIAGNOSIS — Z794 Long term (current) use of insulin: Secondary | ICD-10-CM

## 2021-06-08 DIAGNOSIS — I2510 Atherosclerotic heart disease of native coronary artery without angina pectoris: Secondary | ICD-10-CM | POA: Diagnosis present

## 2021-06-08 DIAGNOSIS — N390 Urinary tract infection, site not specified: Secondary | ICD-10-CM | POA: Diagnosis present

## 2021-06-08 DIAGNOSIS — N1832 Chronic kidney disease, stage 3b: Secondary | ICD-10-CM | POA: Diagnosis present

## 2021-06-08 DIAGNOSIS — R531 Weakness: Secondary | ICD-10-CM | POA: Diagnosis not present

## 2021-06-08 DIAGNOSIS — E782 Mixed hyperlipidemia: Secondary | ICD-10-CM

## 2021-06-08 DIAGNOSIS — N3001 Acute cystitis with hematuria: Secondary | ICD-10-CM

## 2021-06-08 DIAGNOSIS — D72829 Elevated white blood cell count, unspecified: Secondary | ICD-10-CM | POA: Diagnosis not present

## 2021-06-08 DIAGNOSIS — Z9989 Dependence on other enabling machines and devices: Secondary | ICD-10-CM

## 2021-06-08 DIAGNOSIS — Z79899 Other long term (current) drug therapy: Secondary | ICD-10-CM

## 2021-06-08 DIAGNOSIS — I10 Essential (primary) hypertension: Secondary | ICD-10-CM

## 2021-06-08 DIAGNOSIS — I252 Old myocardial infarction: Secondary | ICD-10-CM

## 2021-06-08 DIAGNOSIS — Z7189 Other specified counseling: Secondary | ICD-10-CM | POA: Diagnosis not present

## 2021-06-08 DIAGNOSIS — Z91018 Allergy to other foods: Secondary | ICD-10-CM | POA: Diagnosis not present

## 2021-06-08 DIAGNOSIS — N3000 Acute cystitis without hematuria: Secondary | ICD-10-CM | POA: Diagnosis not present

## 2021-06-08 DIAGNOSIS — Z20822 Contact with and (suspected) exposure to covid-19: Secondary | ICD-10-CM | POA: Diagnosis present

## 2021-06-08 DIAGNOSIS — N183 Chronic kidney disease, stage 3 unspecified: Secondary | ICD-10-CM

## 2021-06-08 DIAGNOSIS — R6510 Systemic inflammatory response syndrome (SIRS) of non-infectious origin without acute organ dysfunction: Secondary | ICD-10-CM | POA: Diagnosis not present

## 2021-06-08 DIAGNOSIS — Z8673 Personal history of transient ischemic attack (TIA), and cerebral infarction without residual deficits: Secondary | ICD-10-CM

## 2021-06-08 DIAGNOSIS — Z8249 Family history of ischemic heart disease and other diseases of the circulatory system: Secondary | ICD-10-CM | POA: Diagnosis not present

## 2021-06-08 DIAGNOSIS — I700 Atherosclerosis of aorta: Secondary | ICD-10-CM | POA: Diagnosis not present

## 2021-06-08 DIAGNOSIS — I340 Nonrheumatic mitral (valve) insufficiency: Secondary | ICD-10-CM | POA: Diagnosis not present

## 2021-06-08 DIAGNOSIS — Z7401 Bed confinement status: Secondary | ICD-10-CM | POA: Diagnosis not present

## 2021-06-08 DIAGNOSIS — R0902 Hypoxemia: Secondary | ICD-10-CM | POA: Diagnosis not present

## 2021-06-08 DIAGNOSIS — Y846 Urinary catheterization as the cause of abnormal reaction of the patient, or of later complication, without mention of misadventure at the time of the procedure: Secondary | ICD-10-CM | POA: Diagnosis present

## 2021-06-08 DIAGNOSIS — Z7984 Long term (current) use of oral hypoglycemic drugs: Secondary | ICD-10-CM

## 2021-06-08 DIAGNOSIS — Z515 Encounter for palliative care: Secondary | ICD-10-CM | POA: Diagnosis not present

## 2021-06-08 DIAGNOSIS — N189 Chronic kidney disease, unspecified: Secondary | ICD-10-CM

## 2021-06-08 LAB — CBC WITH DIFFERENTIAL/PLATELET
Abs Immature Granulocytes: 0.05 10*3/uL (ref 0.00–0.07)
Basophils Absolute: 0.1 10*3/uL (ref 0.0–0.1)
Basophils Relative: 1 %
Eosinophils Absolute: 0 10*3/uL (ref 0.0–0.5)
Eosinophils Relative: 0 %
HCT: 42.5 % (ref 39.0–52.0)
Hemoglobin: 13.9 g/dL (ref 13.0–17.0)
Immature Granulocytes: 1 %
Lymphocytes Relative: 7 %
Lymphs Abs: 0.8 10*3/uL (ref 0.7–4.0)
MCH: 30.6 pg (ref 26.0–34.0)
MCHC: 32.7 g/dL (ref 30.0–36.0)
MCV: 93.6 fL (ref 80.0–100.0)
Monocytes Absolute: 0.5 10*3/uL (ref 0.1–1.0)
Monocytes Relative: 4 %
Neutro Abs: 9.8 10*3/uL — ABNORMAL HIGH (ref 1.7–7.7)
Neutrophils Relative %: 87 %
Platelets: 189 10*3/uL (ref 150–400)
RBC: 4.54 MIL/uL (ref 4.22–5.81)
RDW: 13 % (ref 11.5–15.5)
WBC: 11.1 10*3/uL — ABNORMAL HIGH (ref 4.0–10.5)
nRBC: 0 % (ref 0.0–0.2)

## 2021-06-08 LAB — COMPREHENSIVE METABOLIC PANEL
ALT: 32 U/L (ref 0–44)
AST: 49 U/L — ABNORMAL HIGH (ref 15–41)
Albumin: 3.7 g/dL (ref 3.5–5.0)
Alkaline Phosphatase: 88 U/L (ref 38–126)
Anion gap: 12 (ref 5–15)
BUN: 25 mg/dL — ABNORMAL HIGH (ref 8–23)
CO2: 25 mmol/L (ref 22–32)
Calcium: 9.3 mg/dL (ref 8.9–10.3)
Chloride: 101 mmol/L (ref 98–111)
Creatinine, Ser: 1.58 mg/dL — ABNORMAL HIGH (ref 0.61–1.24)
GFR, Estimated: 42 mL/min — ABNORMAL LOW (ref >60)
Glucose, Bld: 275 mg/dL — ABNORMAL HIGH (ref 70–99)
Potassium: 3.3 mmol/L — ABNORMAL LOW (ref 3.5–5.1)
Sodium: 138 mmol/L (ref 135–145)
Total Bilirubin: 1.1 mg/dL (ref 0.3–1.2)
Total Protein: 8.4 g/dL — ABNORMAL HIGH (ref 6.5–8.1)

## 2021-06-08 LAB — URINALYSIS, ROUTINE W REFLEX MICROSCOPIC
Bilirubin Urine: NEGATIVE
Glucose, UA: >=500 mg/dL — AB
Ketones, ur: NEGATIVE mg/dL
Nitrite: NEGATIVE
Protein, ur: 100 mg/dL — AB
RBC / HPF: >50 RBC/hpf — ABNORMAL HIGH (ref 0–5)
Renal Epithelial: 6
Specific Gravity, Urine: 1.018 (ref 1.005–1.030)
WBC, UA: >50 WBC/hpf — ABNORMAL HIGH (ref 0–5)
pH: 5 (ref 5.0–8.0)

## 2021-06-08 LAB — RESP PANEL BY RT-PCR (FLU A&B, COVID) ARPGX2
Influenza A by PCR: NEGATIVE
Influenza B by PCR: NEGATIVE
SARS Coronavirus 2 by RT PCR: NEGATIVE

## 2021-06-08 LAB — LACTIC ACID, PLASMA: Lactic Acid, Venous: 1.7 mmol/L (ref 0.5–1.9)

## 2021-06-08 LAB — APTT: aPTT: 25 seconds (ref 24–36)

## 2021-06-08 LAB — CBG MONITORING, ED: Glucose-Capillary: 281 mg/dL — ABNORMAL HIGH (ref 70–99)

## 2021-06-08 LAB — PROTIME-INR
INR: 1 (ref 0.8–1.2)
Prothrombin Time: 13.5 seconds (ref 11.4–15.2)

## 2021-06-08 MED ORDER — CALCIUM CARBONATE-VITAMIN D 500-200 MG-UNIT PO TABS
1.0000 | ORAL_TABLET | Freq: Every day | ORAL | Status: DC
  Administered 2021-06-09 – 2021-06-13 (×5): 1 via ORAL
  Filled 2021-06-08 (×5): qty 1

## 2021-06-08 MED ORDER — LACTATED RINGERS IV BOLUS (SEPSIS)
1000.0000 mL | Freq: Once | INTRAVENOUS | Status: AC
  Administered 2021-06-08: 1000 mL via INTRAVENOUS

## 2021-06-08 MED ORDER — INSULIN ASPART 100 UNIT/ML IJ SOLN
0.0000 [IU] | Freq: Three times a day (TID) | INTRAMUSCULAR | Status: DC
  Administered 2021-06-09: 3 [IU] via SUBCUTANEOUS
  Administered 2021-06-09: 7 [IU] via SUBCUTANEOUS
  Administered 2021-06-09 – 2021-06-10 (×3): 3 [IU] via SUBCUTANEOUS
  Administered 2021-06-10: 7 [IU] via SUBCUTANEOUS
  Administered 2021-06-11: 2 [IU] via SUBCUTANEOUS
  Administered 2021-06-11 (×2): 1 [IU] via SUBCUTANEOUS
  Administered 2021-06-12 (×2): 3 [IU] via SUBCUTANEOUS
  Administered 2021-06-12: 2 [IU] via SUBCUTANEOUS
  Administered 2021-06-13 (×2): 3 [IU] via SUBCUTANEOUS

## 2021-06-08 MED ORDER — LACTATED RINGERS IV BOLUS (SEPSIS)
250.0000 mL | Freq: Once | INTRAVENOUS | Status: AC
  Administered 2021-06-08: 250 mL via INTRAVENOUS

## 2021-06-08 MED ORDER — POTASSIUM CHLORIDE CRYS ER 20 MEQ PO TBCR
40.0000 meq | EXTENDED_RELEASE_TABLET | Freq: Once | ORAL | Status: AC
  Administered 2021-06-08: 40 meq via ORAL
  Filled 2021-06-08: qty 2

## 2021-06-08 MED ORDER — ACETAMINOPHEN 325 MG PO TABS
650.0000 mg | ORAL_TABLET | Freq: Once | ORAL | Status: AC
  Administered 2021-06-08: 650 mg via ORAL
  Filled 2021-06-08: qty 2

## 2021-06-08 MED ORDER — INSULIN ASPART 100 UNIT/ML IJ SOLN
0.0000 [IU] | Freq: Every day | INTRAMUSCULAR | Status: DC
  Administered 2021-06-08: 3 [IU] via SUBCUTANEOUS
  Administered 2021-06-09 – 2021-06-10 (×2): 2 [IU] via SUBCUTANEOUS
  Administered 2021-06-12: 5 [IU] via SUBCUTANEOUS
  Filled 2021-06-08: qty 1

## 2021-06-08 MED ORDER — SODIUM CHLORIDE 0.9 % IV SOLN
1.0000 g | INTRAVENOUS | Status: DC
  Administered 2021-06-08: 1 g via INTRAVENOUS
  Filled 2021-06-08: qty 10

## 2021-06-08 MED ORDER — LACTATED RINGERS IV SOLN: INTRAVENOUS | Status: DC

## 2021-06-08 MED ORDER — ADULT MULTIVITAMIN W/MINERALS CH
1.0000 | ORAL_TABLET | Freq: Every day | ORAL | Status: DC
  Administered 2021-06-09 – 2021-06-13 (×5): 1 via ORAL
  Filled 2021-06-08 (×5): qty 1

## 2021-06-08 MED ORDER — ENOXAPARIN SODIUM 40 MG/0.4ML IJ SOSY
40.0000 mg | PREFILLED_SYRINGE | INTRAMUSCULAR | Status: DC
  Administered 2021-06-08 – 2021-06-09 (×2): 40 mg via SUBCUTANEOUS
  Filled 2021-06-08 (×2): qty 0.4

## 2021-06-08 MED ORDER — AMLODIPINE BESYLATE 5 MG PO TABS
2.5000 mg | ORAL_TABLET | Freq: Every day | ORAL | Status: DC
  Administered 2021-06-09 – 2021-06-11 (×3): 2.5 mg via ORAL
  Filled 2021-06-08 (×3): qty 1

## 2021-06-08 NOTE — H&P (Signed)
History and Physical  AC COLAN BHA:[id number] DOB: 25-Jan-1931 DOA: 06/08/2021  Referring physician: Milton Ferguson, MD  PCP: Kathyrn Drown, MD  Patient coming from: Home  Chief Complaint: Fever, Lower abdominal pain  HPI: Luke Solis is a 87 y.o. male with medical history significant for prostate cancer (follows with [facility] urology), T2DM, CKD stage 3A/B, hypertension, hyperlipidemia who resented to the ED accompanied by wife due to fever and abdominal pain.  Patient was recently treated for UTI (cefpodoxime) by his urologist about a week ago, few days ago, he continued to have suprapubic pain with difficulty in urination, PCP was consulted and recommended a bladder ultrasound after which patient was asked to go to an ED for Foley catheterization due to urinary retention, this was placed at [facility] ED on 06/05/2021, this morning patient was noted to be weak and as the day progressed, he became febrile and started to have chills, patient's urologist was contacted by phone and recommended that patient should go to the ED for further evaluation and management.  Patient denies nausea, vomiting, chest pain or shortness of breath.  ED Course:  In the emergency department, he was febrile with a temperature of 102.58F, tachypneic and initially tachycardic.  BP 176/86.  O2 sat was 90-92% on room air.  Work-up in the ED showed mild leukocytosis, hypokalemia, BUN/creatinine 25/1.5 with (baseline creatinine 1.4-1.6), hyperglycemia.  Influenza A, B, SARS coronavirus 2 was negative.  Chest x-ray showed no active disease. Patient was started on IV ceftriaxone, IV hydration was provided, Tylenol was given due to fever.  Hospitalist was asked to admit patient for further evaluation and management.  Review of Systems: Constitutional: Positive for chills and fever.  HENT: Negative for ear pain and sore throat.   Eyes: Negative for pain and visual disturbance.  Respiratory: Negative for cough, chest  tightness and shortness of breath.   Cardiovascular: Negative for chest pain and palpitations.  Gastrointestinal: Positive for lower abdominal pain and negative for vomiting.  Endocrine: Negative for polyphagia and polyuria.  Genitourinary: Positive for urinary retention requiring Foley catheter.  Negative for decreased urine volume Musculoskeletal: Negative for arthralgias and back pain.  Skin: Negative for color change and rash.  Allergic/Immunologic: Negative for immunocompromised state.  Neurological: Negative for tremors, syncope, speech difficulty, weakness, light-headedness and headaches.  Hematological: Does not bruise/bleed easily.  All other systems reviewed and are negative   Past Medical History:  Diagnosis Date   Coronary atherosclerosis of native coronary artery    BMS proximal and mid RCA as well as BMS circumflex - 2002 (had residual 70% distal LAD), LVEF 60%   Diabetes mellitus without complication ([place])    Essential hypertension    Hyperlipidemia    Neuropathy    NSTEMI (non-ST elevated myocardial infarction) ([place])    2002   Prostate cancer ([place])    Sleep apnea    On CPAP   Stroke [facility])    Past Surgical History:  Procedure Laterality Date   COLONOSCOPY     INGUINAL HERNIA REPAIR     KNEE SURGERY     NOSE SURGERY     PROSTATE SURGERY     TONSILLECTOMY      Social History:  reports that he has never smoked. He has never used smokeless tobacco. He reports that he does not drink alcohol and does not use drugs.   Allergies  Allergen Reactions   Broccoli [Brassica Oleracea] Nausea And Vomiting   Celery Oil Nausea And Vomiting  Family History  Problem Relation Age of Onset   Heart attack Mother    Diabetes Mother    Coronary artery disease Father    Hypertension Father    Heart attack Father    Stroke Maternal Grandfather    Lung disease Neg Hx    Cancer Neg Hx      Prior to Admission medications   Medication Sig Start Date End Date Taking?  Authorizing Provider  amLODipine (NORVASC) 2.5 MG tablet TAKE 1 TABLET ONCE DAILY. 05/19/21   Kathyrn Drown, MD  Calcium Carbonate-Vitamin D 600-400 MG-UNIT per tablet Take 1 tablet by mouth daily.    [provider]  fexofenadine (ALLEGRA) 180 MG tablet TAKE 1 TABLET BY MOUTH ONCE DAILY AS NEEDED FOR ALLERGIES. Patient taking differently: Take 180 mg by mouth daily as needed for allergies. 05/07/19   Kathyrn Drown, MD  FORACARE PREMIUM V10 TEST test strip USE AS DIRECTED. 07/20/17   Mikey Kirschner, MD  GLOBAL EASE INJECT PEN NEEDLES 32G X 4 MM MISC USE TO INJECT LANTUS EVERY EVENING. 07/22/20   Kathyrn Drown, MD  insulin glargine (LANTUS SOLOSTAR) 100 UNIT/ML Solostar Pen INJECT 12 UNITS EVERY EVENING. MAY TITRATE UP TO 24 UNITS. 06/05/21   Kathyrn Drown, MD  ketoconazole (NIZORAL) 2 % cream APPLY THIN AMOUNT TWICE DAILY TO INFLAMMED AREA OF GENITALS. 02/27/21   Kathyrn Drown, MD  lidocaine (LIDODERM) 5 % Place 1 patch onto the skin daily. Remove & Discard patch within 12 hours or as directed by MD 02/02/21   Henderly, Britni A, PA-C  metFORMIN (GLUCOPHAGE) 500 MG tablet TAKE 1 TABLET ONCE DAILY. 05/20/21   Kathyrn Drown, MD  Multiple Vitamin (MULTIVITAMIN) tablet Take 1 tablet by mouth daily.    [provider]  mupirocin ointment (BACTROBAN) 2 % Apply thin amount daily Patient taking differently: Apply 1 application topically daily. 05/30/19   Kathyrn Drown, MD  naproxen (NAPROSYN) 500 MG tablet Take 1 tablet (500 mg total) by mouth 2 (two) times daily. 02/02/21   Henderly, Britni A, PA-C  nitroGLYCERIN (NITROSTAT) 0.4 MG SL tablet PLACE ONE (1) TABLET UNDER TONGUE EVERY 5 MINUTES UP TO (3) DOSES AS NEEDED FOR CHEST PAIN. Patient taking differently: Place 0.4 mg under the tongue every 5 (five) minutes as needed for chest pain. 07/12/19   Kathyrn Drown, MD  pregabalin (LYRICA) 100 MG capsule 1 in the morning 2 in the evening 05/08/21   [name], [name], MD  rosuvastatin  (CRESTOR) 40 MG tablet TAKE 1 TABLET ONCE DAILY. Patient taking differently: Take 40 mg by mouth daily. 11/25/20   Kathyrn Drown, MD    Physical Exam: BP (!) 136/52   Pulse 99   Temp (!) 102.9 F (39.4 C) (Oral)   Resp (!) 28   Ht '6\' 2"'  (1.88 m)   Wt 72.1 kg   SpO2 92%   BMI 20.41 kg/m   General: 87 y.o. year-old male well developed well nourished in no acute distress.  Alert and oriented x3. HEENT: NCAT, EOMI Neck: Supple, trachea medial Cardiovascular: Regular rate and rhythm with no rubs or gallops.  No thyromegaly or JVD noted.  No lower extremity edema. 2/4 pulses in all 4 extremities. Respiratory: Clear to auscultation with no wheezes or rales. Good inspiratory effort. Abdomen: Soft, nontender nondistended with normal bowel sounds x4 quadrants. Muskuloskeletal: No cyanosis, clubbing or edema noted bilaterally Neuro: CN II-XII intact, strength 5/5 x 4, sensation, reflexes intact Skin:  No ulcerative lesions noted or rashes Psychiatry: Judgement and insight appear normal. Mood is appropriate for condition and setting          Labs on Admission:  Basic Metabolic Panel: Recent Labs  Lab 06/08/21 1856  NA 138  K 3.3*  CL 101  CO2 25  GLUCOSE 275*  BUN 25*  CREATININE 1.58*  CALCIUM 9.3   Liver Function Tests: Recent Labs  Lab 06/08/21 1856  AST 49*  ALT 32  ALKPHOS 88  BILITOT 1.1  PROT 8.4*  ALBUMIN 3.7   No results for input(s): LIPASE, AMYLASE in the last 168 hours. No results for input(s): AMMONIA in the last 168 hours. CBC: Recent Labs  Lab 06/08/21 1856  WBC 11.1*  NEUTROABS 9.8*  HGB 13.9  HCT 42.5  MCV 93.6  PLT 189   Cardiac Enzymes: No results for input(s): CKTOTAL, CKMB, CKMBINDEX, TROPONINI in the last 168 hours.  BNP (last 3 results) No results for input(s): BNP in the last 8760 hours.  ProBNP (last 3 results) No results for input(s): PROBNP in the last 8760 hours.  CBG: No results for input(s): GLUCAP in the last 168  hours.  Radiological Exams on Admission: DG Chest Port 1 View  Result Date: 06/08/2021 CLINICAL DATA:  Possible sepsis fever EXAM: PORTABLE CHEST 1 VIEW COMPARISON:  04/01/2015, 06/19/2020 FINDINGS: No focal opacity or pleural effusion. Stable cardiomediastinal silhouette with aortic atherosclerosis. No pneumothorax. IMPRESSION: No active disease. Electronically Signed   By: Donavan Foil M.D.   On: 06/08/2021 18:43    EKG: I independently viewed the EKG done and my findings are as followed: Ectopic atrial rhythm at a rate of 96 bpm  Assessment/Plan Present on Admission:  UTI (urinary tract infection)  Essential hypertension, benign  Hyperlipidemia  Principal Problem:   UTI (urinary tract infection) Active Problems:   Hyperlipidemia   Essential hypertension, benign   OSA on CPAP   SIRS (systemic inflammatory response syndrome) (HCC)   Hypokalemia   Leukocytosis   Hyperglycemia due to diabetes mellitus (HCC)   Chronic kidney disease  Catheter associated urinary tract infection POA s/p outpatient failure Patient was on cefpodoxime as an outpatient, but still developed fever, chills and worsening suprapubic pain Urinalysis was positive for glycosuria, leukocytes, RBC. Patient was started on IV ceftriaxone, we shall continue with same at this time Urine culture pending Foley catheter will be removed and a new one placed in the morning per urology (Dr. Junious Silk) recommendation  SIRS in the setting of above Patient was febrile and tachypneic (met SIRS criteria), however patient does not meet sepsis criteria at this time Continue management as described above  Leukocytosis WBC 11.1, continue treatment as described for UTI  Hypokalemia K+ 3.3, this will be replenished  Hyperglycemia secondary to T2DM Continue ISS and hypoglycemic protocol Metformin will be held at this time  CKD stage IIIb BUN/creatinine 25/1.5 with (baseline creatinine 1.4-1.6) Renally adjust medications,  avoid nephrotoxic agents/dehydration/hypotension  Essential hypertension Continue Norvasc  Hyperlipidemia Crestor will be temporarily held due to elevated AST  OSA on CPAP Continue CPAP  Order home meds: Calcium carbonate-vitamin D, multivitamin   DVT prophylaxis: Lovenox  Code Status: Full code  Family Communication: Wife at bedside (all questions answered to satisfaction)  Disposition Plan:  Patient is from:                        home Anticipated DC to:  SNF or family members home Anticipated DC date:               2-3 days Anticipated DC barriers:          Patient requires inpatient management of UTI s/p failure of outpatient treatment  Consults called: None   Admission status: Inpatient    Bernadette Hoit MD [facility]  06/08/2021, 10:32 PM

## 2021-06-08 NOTE — Sepsis Progress Note (Signed)
Monitoring for code sepsis protocol. 

## 2021-06-08 NOTE — Telephone Encounter (Signed)
Telephone call  I spoke with his daughter and with her cousin who is a family physician Lanny Hurst ) Patient with fever had episode of vomiting this afternoon also has cloudy urine.  Patient had catheter placed by [facility] ER on early Saturday morning and was released to home from the ER.  Today has developed fever chills with episode of vomiting and poor drinking.  The catheter is working well.  Given his frailty, underlying diabetes history, advanced age, and recent urologic procedure of more than likely a difficult catheter placement it is concerning that the patient may be developing early urosepsis.  I did speak with the ER doctor regarding this patient.  Family voiced understanding and will be bringing him to the ER.

## 2021-06-08 NOTE — ED Triage Notes (Signed)
Patient was prescribed cefpodoxime 200mg  for uti

## 2021-06-08 NOTE — ED Provider Notes (Signed)
[facility] EMERGENCY DEPARTMENT Provider Note   CSN: [id number] Arrival date & time: 06/08/21  1749     History Chief Complaint  Patient presents with  . Fever    Luke Solis is a 87 y.o. male.  Patient with fevers chills and weakness.  Patient had a Foley placed Friday for bladder obstruction.  He also has prostate cancer  The history is provided by the patient and medical records. No language interpreter was used.  Fever Temp source:  Oral Severity:  Moderate Duration: 1 day. Timing:  Constant Progression:  Worsening Chronicity:  New Relieved by:  Nothing Worsened by:  Nothing Ineffective treatments:  None tried Associated symptoms: no chest pain, no congestion, no cough, no diarrhea, no headaches and no rash   Risk factors: no contaminated food       Past Medical History:  Diagnosis Date  . Coronary atherosclerosis of native coronary artery    BMS proximal and mid RCA as well as BMS circumflex - 2002 (had residual 70% distal LAD), LVEF 60%  . Diabetes mellitus without complication ([place])   . Essential hypertension   . Hyperlipidemia   . Neuropathy   . NSTEMI (non-ST elevated myocardial infarction) ([place])    2002  . Prostate cancer ([place])   . Sleep apnea    On CPAP  . Stroke [facility])     Patient Active Problem List   Diagnosis Date Noted  . Poorly controlled type 2 diabetes mellitus with peripheral neuropathy ([place]) 08/11/2019  . Obstructive sleep apnea treated with continuous positive airway pressure (CPAP) 09/05/2018  . Altered mental status 02/28/2018  . Anemia 02/28/2018  . Fatigue 08/03/2017  . Prostate cancer ([place]) 10/22/2015  . Forgetfulness 04/29/2015  . Type 2 diabetes mellitus with diabetic neuropathy ([place]) 07/17/2013  . Hyperlipidemia 01/19/2010  . OSA (obstructive sleep apnea) 01/19/2010  . Essential hypertension, benign 01/19/2010  . Coronary atherosclerosis of native coronary artery 01/19/2010    Past Surgical History:  Procedure  Laterality Date  . COLONOSCOPY    . INGUINAL HERNIA REPAIR    . KNEE SURGERY    . NOSE SURGERY    . PROSTATE SURGERY    . TONSILLECTOMY         Family History  Problem Relation Age of Onset  . Heart attack Mother   . Diabetes Mother   . Coronary artery disease Father   . Hypertension Father   . Heart attack Father   . Stroke Maternal Grandfather   . Lung disease Neg Hx   . Cancer Neg Hx     Social History   Tobacco Use  . Smoking status: Never  . Smokeless tobacco: Never  Vaping Use  . Vaping Use: Never used  Substance Use Topics  . Alcohol use: No    Alcohol/week: 0.0 standard drinks  . Drug use: No    Home Medications Prior to Admission medications   Medication Sig Start Date End Date Taking? Authorizing Provider  amLODipine (NORVASC) 2.5 MG tablet TAKE 1 TABLET ONCE DAILY. 05/19/21   Kathyrn Drown, MD  Calcium Carbonate-Vitamin D 600-400 MG-UNIT per tablet Take 1 tablet by mouth daily.    [provider]  fexofenadine (ALLEGRA) 180 MG tablet TAKE 1 TABLET BY MOUTH ONCE DAILY AS NEEDED FOR ALLERGIES. Patient taking differently: Take 180 mg by mouth daily as needed for allergies. 05/07/19   Kathyrn Drown, MD  FORACARE PREMIUM V10 TEST test strip USE AS DIRECTED. 07/20/17   Mikey Kirschner,  MD  GLOBAL EASE INJECT PEN NEEDLES 32G X 4 MM MISC USE TO INJECT LANTUS EVERY EVENING. 07/22/20   Kathyrn Drown, MD  insulin glargine (LANTUS SOLOSTAR) 100 UNIT/ML Solostar Pen INJECT 12 UNITS EVERY EVENING. MAY TITRATE UP TO 24 UNITS. 06/05/21   [name], [name], MD  ketoconazole (NIZORAL) 2 % cream APPLY THIN AMOUNT TWICE DAILY TO INFLAMMED AREA OF GENITALS. 02/27/21   Kathyrn Drown, MD  lidocaine (LIDODERM) 5 % Place 1 patch onto the skin daily. Remove & Discard patch within 12 hours or as directed by MD 02/02/21   Henderly, Britni A, PA-C  metFORMIN (GLUCOPHAGE) 500 MG tablet TAKE 1 TABLET ONCE DAILY. 05/20/21   Kathyrn Drown, MD  Multiple Vitamin (MULTIVITAMIN)  tablet Take 1 tablet by mouth daily.    [provider]  mupirocin ointment (BACTROBAN) 2 % Apply thin amount daily Patient taking differently: Apply 1 application topically daily. 05/30/19   Kathyrn Drown, MD  naproxen (NAPROSYN) 500 MG tablet Take 1 tablet (500 mg total) by mouth 2 (two) times daily. 02/02/21   Henderly, Britni A, PA-C  nitroGLYCERIN (NITROSTAT) 0.4 MG SL tablet PLACE ONE (1) TABLET UNDER TONGUE EVERY 5 MINUTES UP TO (3) DOSES AS NEEDED FOR CHEST PAIN. Patient taking differently: Place 0.4 mg under the tongue every 5 (five) minutes as needed for chest pain. 07/12/19   Kathyrn Drown, MD  pregabalin (LYRICA) 100 MG capsule 1 in the morning 2 in the evening 05/08/21   [name], [name], MD  rosuvastatin (CRESTOR) 40 MG tablet TAKE 1 TABLET ONCE DAILY. Patient taking differently: Take 40 mg by mouth daily. 11/25/20   Kathyrn Drown, MD    Allergies    Eual Fines Marta Lamas oleracea] and Celery oil  Review of Systems   Review of Systems  Constitutional:  Positive for fever. Negative for appetite change and fatigue.  HENT:  Negative for congestion, ear discharge and sinus pressure.   Eyes:  Negative for discharge.  Respiratory:  Negative for cough.   Cardiovascular:  Negative for chest pain.  Gastrointestinal:  Negative for abdominal pain and diarrhea.  Genitourinary:  Negative for frequency and hematuria.  Musculoskeletal:  Negative for back pain.  Skin:  Negative for rash.  Neurological:  Negative for seizures and headaches.  Psychiatric/Behavioral:  Negative for hallucinations.    Physical Exam Updated Vital Signs BP (!) 136/52   Pulse 99   Temp (!) 102.9 F (39.4 C) (Oral)   Resp (!) 28   Ht 6\' 2"  (1.88 m)   Wt 72.1 kg   SpO2 92%   BMI 20.41 kg/m   Physical Exam Vitals and nursing note reviewed.  Constitutional:      Appearance: He is well-developed.  HENT:     Head: Normocephalic.     Nose: Nose normal.  Eyes:     General: No scleral icterus.     Conjunctiva/sclera: Conjunctivae normal.  Neck:     Thyroid: No thyromegaly.  Cardiovascular:     Rate and Rhythm: Normal rate and regular rhythm.     Heart sounds: No murmur heard.   No friction rub. No gallop.  Pulmonary:     Breath sounds: No stridor. No wheezing or rales.  Chest:     Chest wall: No tenderness.  Abdominal:     General: There is no distension.     Tenderness: There is no abdominal tenderness. There is no rebound.  Musculoskeletal:        General:  Normal range of motion.     Cervical back: Neck supple.  Lymphadenopathy:     Cervical: No cervical adenopathy.  Skin:    Findings: No erythema or rash.  Neurological:     Mental Status: He is oriented to person, place, and time.     Motor: No abnormal muscle tone.     Coordination: Coordination normal.  Psychiatric:        Behavior: Behavior normal.    ED Results / Procedures / Treatments   Labs (all labs ordered are listed, but only abnormal results are displayed) Labs Reviewed  COMPREHENSIVE METABOLIC PANEL - Abnormal; Notable for the following components:      Result Value   Potassium 3.3 (*)    Glucose, Bld 275 (*)    BUN 25 (*)    Creatinine, Ser 1.58 (*)    Total Protein 8.4 (*)    AST 49 (*)    GFR, Estimated 42 (*)    All other components within normal limits  CBC WITH DIFFERENTIAL/PLATELET - Abnormal; Notable for the following components:   WBC 11.1 (*)    Neutro Abs 9.8 (*)    All other components within normal limits  URINALYSIS, ROUTINE W REFLEX MICROSCOPIC - Abnormal; Notable for the following components:   APPearance CLOUDY (*)    Glucose, UA >=500 (*)    Hgb urine dipstick LARGE (*)    Protein, ur 100 (*)    Leukocytes,Ua LARGE (*)    RBC / HPF >50 (*)    WBC, UA >50 (*)    Bacteria, UA FEW (*)    Non Squamous Epithelial 0-5 (*)    All other components within normal limits  RESP PANEL BY RT-PCR (FLU A&B, COVID) ARPGX2  CULTURE, BLOOD (ROUTINE X 2)  CULTURE, BLOOD (ROUTINE X 2)   URINE CULTURE  LACTIC ACID, PLASMA  PROTIME-INR  APTT  LACTIC ACID, PLASMA    EKG None  Radiology DG Chest Port 1 View  Result Date: 06/08/2021 CLINICAL DATA:  Possible sepsis fever EXAM: PORTABLE CHEST 1 VIEW COMPARISON:  04/01/2015, 06/19/2020 FINDINGS: No focal opacity or pleural effusion. Stable cardiomediastinal silhouette with aortic atherosclerosis. No pneumothorax. IMPRESSION: No active disease. Electronically Signed   By: Donavan Foil M.D.   On: 06/08/2021 18:43    Procedures Procedures   Medications Ordered in ED Medications  lactated ringers infusion ( Intravenous New Bag/Given 06/08/21 1957)  cefTRIAXone (ROCEPHIN) 1 g in sodium chloride 0.9 % 100 mL IVPB (0 g Intravenous Stopped 06/08/21 1958)  lactated ringers bolus 1,000 mL (0 mLs Intravenous Stopped 06/08/21 2124)    And  lactated ringers bolus 1,000 mL (0 mLs Intravenous Stopped 06/08/21 2123)    And  lactated ringers bolus 250 mL (250 mLs Intravenous New Bag/Given 06/08/21 1958)  acetaminophen (TYLENOL) tablet 650 mg (650 mg Oral Given 06/08/21 1928)    ED Course  I have reviewed the triage vital signs and the nursing notes.  Pertinent labs & imaging results that were available during my care of the patient were reviewed by me and considered in my medical decision making (see chart for details).    MDM Rules/Calculators/A&P                          Patient with urinary tract infection.  Patient will be admitted to medicine.  Medicine is consulting urology over the phone today Final Clinical Impression(s) / ED Diagnoses Final diagnoses:  Acute cystitis with hematuria  Rx / DC Orders ED Discharge Orders     None        Milton Ferguson, MD 06/09/21 1006

## 2021-06-08 NOTE — ED Triage Notes (Signed)
Fever with urinary symptoms, has indwelling catheter

## 2021-06-09 ENCOUNTER — Encounter (HOSPITAL_COMMUNITY): Payer: Self-pay | Admitting: Internal Medicine

## 2021-06-09 DIAGNOSIS — N3001 Acute cystitis with hematuria: Secondary | ICD-10-CM

## 2021-06-09 DIAGNOSIS — E1142 Type 2 diabetes mellitus with diabetic polyneuropathy: Secondary | ICD-10-CM

## 2021-06-09 DIAGNOSIS — N183 Chronic kidney disease, stage 3 unspecified: Secondary | ICD-10-CM

## 2021-06-09 DIAGNOSIS — N1832 Chronic kidney disease, stage 3b: Secondary | ICD-10-CM

## 2021-06-09 DIAGNOSIS — A419 Sepsis, unspecified organism: Secondary | ICD-10-CM

## 2021-06-09 LAB — COMPREHENSIVE METABOLIC PANEL
ALT: 52 U/L — ABNORMAL HIGH (ref 0–44)
AST: 63 U/L — ABNORMAL HIGH (ref 15–41)
Albumin: 2.8 g/dL — ABNORMAL LOW (ref 3.5–5.0)
Alkaline Phosphatase: 73 U/L (ref 38–126)
Anion gap: 11 (ref 5–15)
BUN: 25 mg/dL — ABNORMAL HIGH (ref 8–23)
CO2: 22 mmol/L (ref 22–32)
Calcium: 8.5 mg/dL — ABNORMAL LOW (ref 8.9–10.3)
Chloride: 104 mmol/L (ref 98–111)
Creatinine, Ser: 1.55 mg/dL — ABNORMAL HIGH (ref 0.61–1.24)
GFR, Estimated: 43 mL/min — ABNORMAL LOW (ref >60)
Glucose, Bld: 245 mg/dL — ABNORMAL HIGH (ref 70–99)
Potassium: 3.3 mmol/L — ABNORMAL LOW (ref 3.5–5.1)
Sodium: 137 mmol/L (ref 135–145)
Total Bilirubin: 0.7 mg/dL (ref 0.3–1.2)
Total Protein: 6.5 g/dL (ref 6.5–8.1)

## 2021-06-09 LAB — CBC
HCT: 33.9 % — ABNORMAL LOW (ref 39.0–52.0)
Hemoglobin: 11.4 g/dL — ABNORMAL LOW (ref 13.0–17.0)
MCH: 31 pg (ref 26.0–34.0)
MCHC: 33.6 g/dL (ref 30.0–36.0)
MCV: 92.1 fL (ref 80.0–100.0)
Platelets: 144 10*3/uL — ABNORMAL LOW (ref 150–400)
RBC: 3.68 MIL/uL — ABNORMAL LOW (ref 4.22–5.81)
RDW: 12.9 % (ref 11.5–15.5)
WBC: 11.5 10*3/uL — ABNORMAL HIGH (ref 4.0–10.5)
nRBC: 0 % (ref 0.0–0.2)

## 2021-06-09 LAB — PROTIME-INR
INR: 1.2 (ref 0.8–1.2)
Prothrombin Time: 15 seconds (ref 11.4–15.2)

## 2021-06-09 LAB — PHOSPHORUS: Phosphorus: 1.6 mg/dL — ABNORMAL LOW (ref 2.5–4.6)

## 2021-06-09 LAB — APTT: aPTT: 34 seconds (ref 24–36)

## 2021-06-09 LAB — GLUCOSE, CAPILLARY
Glucose-Capillary: 216 mg/dL — ABNORMAL HIGH (ref 70–99)
Glucose-Capillary: 240 mg/dL — ABNORMAL HIGH (ref 70–99)
Glucose-Capillary: 323 mg/dL — ABNORMAL HIGH (ref 70–99)
Glucose-Capillary: 210 mg/dL — ABNORMAL HIGH (ref 70–99)

## 2021-06-09 LAB — LACTIC ACID, PLASMA: Lactic Acid, Venous: 1.6 mmol/L (ref 0.5–1.9)

## 2021-06-09 LAB — MAGNESIUM: Magnesium: 1.6 mg/dL — ABNORMAL LOW (ref 1.7–2.4)

## 2021-06-09 LAB — MRSA NEXT GEN BY PCR, NASAL: MRSA by PCR Next Gen: NOT DETECTED

## 2021-06-09 MED ORDER — MAGNESIUM SULFATE 2 GM/50ML IV SOLN
2.0000 g | Freq: Once | INTRAVENOUS | Status: AC
  Administered 2021-06-09: 2 g via INTRAVENOUS
  Filled 2021-06-09: qty 50

## 2021-06-09 MED ORDER — CHLORHEXIDINE GLUCONATE CLOTH 2 % EX PADS
6.0000 | MEDICATED_PAD | Freq: Every day | CUTANEOUS | Status: DC
  Administered 2021-06-09 – 2021-06-13 (×5): 6 via TOPICAL

## 2021-06-09 MED ORDER — ONDANSETRON HCL 4 MG/2ML IJ SOLN
4.0000 mg | Freq: Four times a day (QID) | INTRAMUSCULAR | Status: DC | PRN
  Administered 2021-06-09 – 2021-06-11 (×3): 4 mg via INTRAVENOUS
  Filled 2021-06-09 (×3): qty 2

## 2021-06-09 MED ORDER — POTASSIUM CHLORIDE CRYS ER 10 MEQ PO TBCR
10.0000 meq | EXTENDED_RELEASE_TABLET | Freq: Once | ORAL | Status: AC
  Administered 2021-06-09: 10 meq via ORAL
  Filled 2021-06-09: qty 1

## 2021-06-09 MED ORDER — POTASSIUM CHLORIDE IN NACL 20-0.9 MEQ/L-% IV SOLN: INTRAVENOUS | Status: DC

## 2021-06-09 MED ORDER — ACETAMINOPHEN 325 MG PO TABS
650.0000 mg | ORAL_TABLET | Freq: Four times a day (QID) | ORAL | Status: DC | PRN
  Administered 2021-06-09 – 2021-06-11 (×4): 650 mg via ORAL
  Filled 2021-06-09 (×4): qty 2

## 2021-06-09 MED ORDER — ACETAMINOPHEN 650 MG RE SUPP
650.0000 mg | Freq: Four times a day (QID) | RECTAL | Status: DC | PRN
  Administered 2021-06-09 – 2021-06-10 (×2): 650 mg via RECTAL
  Filled 2021-06-09 (×2): qty 1

## 2021-06-09 MED ORDER — PIPERACILLIN-TAZOBACTAM 3.375 G IVPB
3.3750 g | Freq: Three times a day (TID) | INTRAVENOUS | Status: DC
  Administered 2021-06-09 – 2021-06-10 (×4): 3.375 g via INTRAVENOUS
  Filled 2021-06-09 (×4): qty 50

## 2021-06-09 MED ORDER — INSULIN GLARGINE 100 UNIT/ML ~~LOC~~ SOLN
10.0000 [IU] | Freq: Every day | SUBCUTANEOUS | Status: DC
  Administered 2021-06-09 – 2021-06-10 (×2): 10 [IU] via SUBCUTANEOUS
  Filled 2021-06-09 (×3): qty 0.1

## 2021-06-09 NOTE — Progress Notes (Signed)
Noted on task list for order to replace foley catheter. Dr Tat made aware and per verbal from Dr Tat, leave current foley in and nursing do not change at this time.

## 2021-06-09 NOTE — Progress Notes (Signed)
Spoke with patient and his wife and she informed me that he hasn't used his CPAP the last two years due to a bladder issue that had him getting up and down during the night. So patient wife has asked if he can use 2L. RT informed wife that a unit would be on standby if patient may need it.

## 2021-06-09 NOTE — Evaluation (Signed)
Clinical/Bedside Swallow Evaluation Patient Details  Name: Luke Solis MRN: [id number] Date of Birth: Sep 24, 1931  Today's Date: 06/09/2021 Time: SLP Start Time (ACUTE ONLY): 99 SLP Stop Time (ACUTE ONLY): 1326 SLP Time Calculation (min) (ACUTE ONLY): 25 min  Past Medical History:  Past Medical History:  Diagnosis Date   Coronary atherosclerosis of native coronary artery    BMS proximal and mid RCA as well as BMS circumflex - 2002 (had residual 70% distal LAD), LVEF 60%   Diabetes mellitus without complication (HCC)    Essential hypertension    Hyperlipidemia    Neuropathy    NSTEMI (non-ST elevated myocardial infarction) ()    2002   Prostate cancer ([place])    Sleep apnea    On CPAP   Stroke [facility])    Past Surgical History:  Past Surgical History:  Procedure Laterality Date   COLONOSCOPY     INGUINAL HERNIA REPAIR     KNEE SURGERY     NOSE SURGERY     PROSTATE SURGERY     TONSILLECTOMY     HPI:  Luke Solis is a 87 y.o. male with medical history significant for prostate cancer (follows with [facility] urology), T2DM, CKD stage 3A/B, hypertension, hyperlipidemia who resented to the ED accompanied by wife due to fever and abdominal pain.  Patient was recently treated for UTI (cefpodoxime) by his urologist about a week ago, few days ago, he continued to have suprapubic pain with difficulty in urination, PCP was consulted and recommended a bladder ultrasound after which patient was asked to go to an ED for Foley catheterization due to urinary retention, this was placed at [facility] ED on 06/05/2021, this morning patient was noted to be weak and as the day progressed, he became febrile and started to have chills, patient's urologist was contacted by phone and recommended that patient should go to the ED for further evaluation and management.  Patient denies nausea, vomiting, chest pain or shortness of breath. BSE requested due to Pt clearing throat and coughing after po per RN.    Assessment / Plan / Recommendation Clinical Impression  Clinical swallow evaluation completed at bedside. Pt reports occasional coughing at home during po intake. Oral motor examination reveals mild lingual weakness, otherwise WNL. Pt with dried oral secretions on palate which cleared with oral care. Pt with clear, strong vocal quality. Pt assessed with ice chips, thin water via tsp/cup/straw, NTL, puree, mech soft, and regular textures. Pt with multiple swallows and occasional delayed throat clearing and one episode of immediate cough after thins, but vocal quality remains clear. Pt with prolonged oral transit with solids (chicken cut up) and suspected lingual weakness with lingual scatter, necessitating liquid wash to clear. SLP checked back later and spoke with family- wife indicates that Pt does cough after thin liquids at home, but no h/o URI or PNA. She also says he saw Dr. Benjamine Mola possibly in the past in regards to swallowing, however no records found. Recommend D3/mech soft and thin liquids with supervision for intake, po medication whole with water or large pills in puree, alternate solids/liquids to ensure clearance of oral cavity. Will consider MBSS given family report of coughing with liquids at home. SLP will follow in acute setting.   SLP Visit Diagnosis: Dysphagia, unspecified (R13.10)    Aspiration Risk  Mild aspiration risk    Diet Recommendation Dysphagia 3 (Mech soft);Thin liquid   Liquid Administration via: Cup;Straw Medication Administration: Whole meds with liquid (place larger pills whole  in puree) Supervision: Staff to assist with self feeding;Full supervision/cueing for compensatory strategies Compensations: Slow rate;Small sips/bites;Lingual sweep for clearance of pocketing;Multiple dry swallows after each bite/sip;Follow solids with liquid Postural Changes: Seated upright at 90 degrees;Remain upright for at least 30 minutes after po intake    Other  Recommendations Oral  Care Recommendations: Oral care BID;Staff/trained caregiver to provide oral care Other Recommendations: Clarify dietary restrictions   Follow up Recommendations  (pending)      Frequency and Duration min 2x/week  1 week       Prognosis Prognosis for Safe Diet Advancement: Good Barriers to Reach Goals:  (suspect improvement with abx for UTI)      Swallow Study   General Date of Onset: 06/08/21 HPI: Luke Solis is a 87 y.o. male with medical history significant for prostate cancer (follows with [facility] urology), T2DM, CKD stage 3A/B, hypertension, hyperlipidemia who resented to the ED accompanied by wife due to fever and abdominal pain.  Patient was recently treated for UTI (cefpodoxime) by his urologist about a week ago, few days ago, he continued to have suprapubic pain with difficulty in urination, PCP was consulted and recommended a bladder ultrasound after which patient was asked to go to an ED for Foley catheterization due to urinary retention, this was placed at [facility] ED on 06/05/2021, this morning patient was noted to be weak and as the day progressed, he became febrile and started to have chills, patient's urologist was contacted by phone and recommended that patient should go to the ED for further evaluation and management.  Patient denies nausea, vomiting, chest pain or shortness of breath. BSE requested due to Pt clearing throat and coughing after po per RN. Type of Study: Bedside Swallow Evaluation Previous Swallow Assessment: N/A Diet Prior to this Study: Regular;Thin liquids Temperature Spikes Noted: No Respiratory Status: Room air History of Recent Intubation: No Behavior/Cognition: Alert;Cooperative;Pleasant mood;Requires cueing Oral Cavity Assessment: Dried secretions Oral Care Completed by SLP: Yes Oral Cavity - Dentition: Adequate natural dentition Vision: Functional for self-feeding Self-Feeding Abilities: Able to feed self;Needs set up Patient Positioning:  Upright in bed Baseline Vocal Quality: Normal Volitional Cough: Strong Volitional Swallow: Able to elicit    Oral/Motor/Sensory Function Overall Oral Motor/Sensory Function: Mild impairment Facial ROM: Within Functional Limits Facial Symmetry: Within Functional Limits Facial Strength: Within Functional Limits Facial Sensation: Within Functional Limits Lingual ROM: Within Functional Limits Lingual Symmetry: Within Functional Limits Lingual Strength: Reduced Lingual Sensation: Reduced Velum: Within Functional Limits Mandible: Within Functional Limits   Ice Chips Ice chips: Within functional limits Presentation: Spoon   Thin Liquid Thin Liquid: Impaired Presentation: Cup;Self Fed;Straw Pharyngeal  Phase Impairments: Multiple swallows;Cough - Immediate;Throat Clearing - Delayed (immediate cough x1) Other Comments: cough is strong and clear    Nectar Thick Nectar Thick Liquid: Impaired Presentation: Cup;Straw Pharyngeal Phase Impairments: Multiple swallows;Throat Clearing - Delayed   Honey Thick Honey Thick Liquid: Not tested   Puree Puree: Within functional limits Presentation: Spoon   Solid     Solid: Impaired Oral Phase Impairments: Reduced lingual movement/coordination Oral Phase Functional Implications: Prolonged oral transit;Oral residue (lingual scatte)     Thank you,  Genene Churn, Christie  Weslie Pretlow 06/09/2021,1:42 PM

## 2021-06-09 NOTE — Progress Notes (Signed)
PROGRESS NOTE  Luke Solis:[id number] DOB: [birth year] DOA: 06/08/2021 PCP: Kathyrn Drown, MD  Brief History:  87 year old male with a history of prostate cancer, urethral stricture, diabetes mellitus type 2, CKD stage III, hypertension, hyperlipidemia, OSA, stroke, coronary artery disease, cognitive impairment presenting with 1 to 2-day history of fever, abdominal pain, and confusion.  The patient is a poor historian secondary to his cognitive impairment.  History is obtained from review of the medical record and speaking with the patient's spouse.  She states that the patient had been on antibiotics (Bactrim) for about 3 weeks up until the end of June.  At the end of June, the patient began having some suprapubic pain and dysuria.  The patient contacted his PCP.  An outpatient ultrasound revealed a postvoid residual with a 479 cc.  He went to [facility] ED on 06/05/2021 where he had a postvoid residual of 650 cc.  A Foley catheter was placed.  The patient was sent home with cefpodoxime.  Urine culture during that ED visit grew 10,000-50,000 colonies of Candida albicans.   Patient spouse states that the patient began developing fevers, chills, nausea and continued suprapubic pain.  The patient had worsening confusion and generalized weakness.  As result, the patient was brought to emergency department for further evaluation.  Notably, the patient has had a chronic history of fecal incontinence. In the emergency department, the patient had fever up to 1 to 2.9 F with tachycardia.  WBC increased to 11.5.  He was hemodynamically stable.  Patient was started on ceftriaxone.  Assessment/Plan: Sepsis -Present on admission -Presented with leukocytosis, fever, Tachycardia -Secondary to urinary source -Lactic acid peaked 1.7 -Given the patient's frequent antibiotic exposure, plan antibiotic coverage to Zosyn pending urine culture data -Continue IV fluids  CAUTI -Broaden antibiotic coverage to  Zosyn pending urine culture data  Urethral stricture -Follow-up at [facility] -Last dilated 03/26/2021  CKD stage IIIb -Baseline creatinine 3.6-1.4  Acute metabolic encephalopathy -Secondary to sepsis -Patient has underlying cognitive impairment -06/09/2021--more awake and alert, but still slow to respond and confused  Uncontrolled diabetes mellitus type 2 with hyperglycemia -NovoLog sliding scale -03/25/2021 hemoglobin A1c 10.9 -Hold metformin -Start reduced dose Lantus  Essential hypertension -Continue amlodipine  Hyperlipidemia -Continue Crestor  Hypokalemia/Hypomagnesemia -replete      Status is: Inpatient  Remains inpatient appropriate because:IV treatments appropriate due to intensity of illness or inability to take PO  Dispo: The patient is from: Home              Anticipated d/c is to: Home              Patient currently is not medically stable to d/c.   Difficult to place patient No        Family Communication:   spouse updated 7/5 emergency my crazy lady and 7  Consultants:  none  Code Status:  FULL / DNR  DVT Prophylaxis:  [place] Heparin / [place] Lovenox   Procedures: As Listed in Progress Note Above  Antibiotics: Ceftriaxone 7/4 -zosyn 7/5>>      Subjective: Patient denies fevers, chills, headache, chest pain, dyspnea, nausea, vomiting, diarrhea, abdominal pain, dysuria, hematuria, hematochezia, and melena.   Objective: Vitals:   06/09/21 0557 06/09/21 0600 06/09/21 0700 06/09/21 0721  BP: (!) 154/66 (!) 160/65 (!) 144/53   Pulse: 88 86 79   Resp: (!) 31 (!) 35    Temp: 100 F (37.8 C)   99.8  F (37.7 C)  TempSrc: Oral   Oral  SpO2: 93% 94% 95%   Weight: 70 kg     Height: 6\' 2"  (1.88 m)       Intake/Output Summary (Last 24 hours) at 06/09/2021 0813 Last data filed at 06/09/2021 0559 Gross per 24 hour  Intake 6330.36 ml  Output 800 ml  Net 5530.36 ml   Weight change:  Exam:  General:  Pt is alert, follows commands appropriately,  not in acute distress HEENT: No icterus, No thrush, No neck mass, [place]/AT Cardiovascular: RRR, S1/S2, no rubs, no gallops Respiratory: fine bibasilar rales. No wheeze Abdomen: Soft/+BS, non tender, non distended, no guarding Extremities: No edema, No lymphangitis, No petechiae, No rashes, no synovitis   Data Reviewed: Is vital at home today because she is she is freaking crazy So she actually came from home she lives with her son have personally reviewed following labs and imaging studies Basic Metabolic Panel: Recent Labs  Lab 06/08/21 1856 06/09/21 0517  NA 138 137  K 3.3* 3.3*  CL 101 104  CO2 25 22  GLUCOSE 275* 245*  BUN 25* 25*  CREATININE 1.58* 1.55*  CALCIUM 9.3 8.5*  MG  --  1.6*  PHOS  --  1.6*   Liver Function Tests: Recent Labs  Lab 06/08/21 1856 06/09/21 0517  AST 49* 63*  ALT 32 52*  ALKPHOS 88 73  BILITOT 1.1 0.7  PROT 8.4* 6.5  ALBUMIN 3.7 2.8*   No results for input(s): LIPASE, AMYLASE in the last 168 hours. No results for input(s): AMMONIA in the last 168 hours. Coagulation Profile: Recent Labs  Lab 06/08/21 1856 06/09/21 0517  INR 1.0 1.2   CBC: Recent Labs  Lab 06/08/21 1856 06/09/21 0517  WBC 11.1* 11.5*  NEUTROABS 9.8*  --   HGB 13.9 11.4*  HCT 42.5 33.9*  MCV 93.6 92.1  PLT 189 144*   Cardiac Enzymes: No results for input(s): CKTOTAL, CKMB, CKMBINDEX, TROPONINI in the last 168 hours. BNP: Invalid input(s): POCBNP CBG: Recent Labs  Lab 06/08/21 2308 06/09/21 0720  GLUCAP 281* 216*   HbA1C: No results for input(s): HGBA1C in the last 72 hours. Urine analysis:    Component Value Date/Time   COLORURINE YELLOW 06/08/2021 1930   APPEARANCEUR CLOUDY (A) 06/08/2021 1930   LABSPEC 1.018 06/08/2021 1930   PHURINE 5.0 06/08/2021 1930   GLUCOSEU >=500 (A) 06/08/2021 1930   HGBUR LARGE (A) 06/08/2021 1930   BILIRUBINUR NEGATIVE 06/08/2021 1930   BILIRUBINUR + 12/22/2018 1540   KETONESUR NEGATIVE 06/08/2021 1930   PROTEINUR  100 (A) 06/08/2021 1930   UROBILINOGEN 0.2 09/11/2007 1600   NITRITE NEGATIVE 06/08/2021 1930   LEUKOCYTESUR LARGE (A) 06/08/2021 1930   Sepsis Labs: @LABRCNTIP (procalcitonin:4,lacticidven:4) ) Recent Results (from the past 240 hour(s))  Resp Panel by RT-PCR (Flu A&B, Covid) Nasopharyngeal Swab     Status: None   Collection Time: 06/08/21  6:22 PM   Specimen: Nasopharyngeal Swab; Nasopharyngeal(NP) swabs in vial transport medium  Result Value Ref Range Status   SARS Coronavirus 2 by RT PCR NEGATIVE NEGATIVE Final    Comment: (NOTE) SARS-CoV-2 target nucleic acids are NOT DETECTED.  The SARS-CoV-2 RNA is generally detectable in upper respiratory specimens during the acute phase of infection. The lowest concentration of SARS-CoV-2 viral copies this assay can detect is 138 copies/mL. A negative result does not preclude SARS-Cov-2 infection and should not be used as the sole basis for treatment or other patient management decisions. A negative result  may occur with  improper specimen collection/handling, submission of specimen other than nasopharyngeal swab, presence of viral mutation(s) within the areas targeted by this assay, and inadequate number of viral copies(<138 copies/mL). A negative result must be combined with clinical observations, patient history, and epidemiological information. The expected result is Negative.  Fact Sheet for Patients:  EntrepreneurPulse.com.au  Fact Sheet for Healthcare Providers:  IncredibleEmployment.be  This test is no t yet approved or cleared by the [place] FDA and  has been authorized for detection and/or diagnosis of SARS-CoV-2 by FDA under an Emergency Use Authorization (EUA). This EUA will remain  in effect (meaning this test can be used) for the duration of the COVID-19 declaration under Section 564(b)(1) of the Act, 21 U.S.C.section 360bbb-3(b)(1), unless the authorization is terminated  or  revoked sooner.       Influenza A by PCR NEGATIVE NEGATIVE Final   Influenza B by PCR NEGATIVE NEGATIVE Final    Comment: (NOTE) The Xpert Xpress SARS-CoV-2/FLU/RSV plus assay is intended as an aid in the diagnosis of influenza from Nasopharyngeal swab specimens and should not be used as a sole basis for treatment. Nasal washings and aspirates are unacceptable for Xpert Xpress SARS-CoV-2/FLU/RSV testing.  Fact Sheet for Patients: EntrepreneurPulse.com.au  Fact Sheet for Healthcare Providers: IncredibleEmployment.be  This test is not yet approved or cleared by the [place] FDA and has been authorized for detection and/or diagnosis of SARS-CoV-2 by FDA under an Emergency Use Authorization (EUA). This EUA will remain in effect (meaning this test can be used) for the duration of the COVID-19 declaration under Section 564(b)(1) of the Act, 21 U.S.C. section 360bbb-3(b)(1), unless the authorization is terminated or revoked.  Performed at [facility], [address] [place], [place] [address]      Scheduled Meds:  amLODipine  2.5 mg Oral Daily   calcium-vitamin D  1 tablet Oral Q breakfast   Chlorhexidine Gluconate Cloth  6 each Topical Daily   enoxaparin (LOVENOX) injection  40 mg Subcutaneous Q24H   insulin aspart  0-5 Units Subcutaneous QHS   insulin aspart  0-9 Units Subcutaneous TID WC   multivitamin with minerals  1 tablet Oral Daily   Continuous Infusions:  cefTRIAXone (ROCEPHIN)  IV Stopped (06/08/21 1958)   lactated ringers Stopped (06/09/21 0516)    Procedures/Studies: US Pelvis Limited  Result Date: 06/05/2021 CLINICAL DATA:  Pelvic pain, possible urinary retention EXAM: LIMITED ULTRASOUND OF PELVIS TECHNIQUE: Limited transabdominal ultrasound examination of the pelvis was performed. COMPARISON:  None. FINDINGS: Bladder is well distended. No filling defect is noted. Prevoid volume is 500 mL. Postvoid volume is 479 mL.  IMPRESSION: Findings suggestive of bladder outlet obstruction. Electronically Signed   By: Inez Catalina M.D.   On: 06/05/2021 16:07   DG Chest Port 1 View  Result Date: 06/08/2021 CLINICAL DATA:  Possible sepsis fever EXAM: PORTABLE CHEST 1 VIEW COMPARISON:  04/01/2015, 06/19/2020 FINDINGS: No focal opacity or pleural effusion. Stable cardiomediastinal silhouette with aortic atherosclerosis. No pneumothorax. IMPRESSION: No active disease. Electronically Signed   By: Donavan Foil M.D.   On: 06/08/2021 18:43    Orson Eva, DO  [facility]  If 7PM-7AM, please contact night-coverage [email or web address] Password [email or web address] 06/09/2021, 8:13 AM   LOS: 1 day

## 2021-06-09 NOTE — Progress Notes (Signed)
Telephone call no answer 

## 2021-06-09 NOTE — Progress Notes (Signed)
At around 5:15 pm, patient noted to have a small amount of yellow colored emesis. Mouth suctioned. Patient complained of feeling nauseous. Denied feeling short of breath or difficulty breathing. Vital signs Heart rate 82, B/P 176/80, O2 sat 95% on 2 Liters, Temp 100.5 orally and respirations about 38. Dr Tat made aware. PRN Zofran ordered by Dr Tat and given. Patient sitting upright in the bed, alert with wife at bedside.

## 2021-06-10 ENCOUNTER — Inpatient Hospital Stay (HOSPITAL_COMMUNITY): Payer: Medicare Other

## 2021-06-10 DIAGNOSIS — B377 Candidal sepsis: Secondary | ICD-10-CM

## 2021-06-10 LAB — BASIC METABOLIC PANEL
Anion gap: 11 (ref 5–15)
BUN: 34 mg/dL — ABNORMAL HIGH (ref 8–23)
CO2: 24 mmol/L (ref 22–32)
Calcium: 8.6 mg/dL — ABNORMAL LOW (ref 8.9–10.3)
Chloride: 105 mmol/L (ref 98–111)
Creatinine, Ser: 1.81 mg/dL — ABNORMAL HIGH (ref 0.61–1.24)
GFR, Estimated: 36 mL/min — ABNORMAL LOW (ref >60)
Glucose, Bld: 226 mg/dL — ABNORMAL HIGH (ref 70–99)
Potassium: 3.8 mmol/L (ref 3.5–5.1)
Sodium: 140 mmol/L (ref 135–145)

## 2021-06-10 LAB — CBC
HCT: 37.6 % — ABNORMAL LOW (ref 39.0–52.0)
Hemoglobin: 12.1 g/dL — ABNORMAL LOW (ref 13.0–17.0)
MCH: 30.6 pg (ref 26.0–34.0)
MCHC: 32.2 g/dL (ref 30.0–36.0)
MCV: 94.9 fL (ref 80.0–100.0)
Platelets: 150 10*3/uL (ref 150–400)
RBC: 3.96 MIL/uL — ABNORMAL LOW (ref 4.22–5.81)
RDW: 12.8 % (ref 11.5–15.5)
WBC: 13.6 10*3/uL — ABNORMAL HIGH (ref 4.0–10.5)
nRBC: 0 % (ref 0.0–0.2)

## 2021-06-10 LAB — BLOOD CULTURE ID PANEL (REFLEXED) - BCID2

## 2021-06-10 LAB — URINE CULTURE: Culture: >=100000 — AB

## 2021-06-10 LAB — HEMOGLOBIN A1C
Hgb A1c MFr Bld: 9 % — ABNORMAL HIGH (ref 4.8–5.6)
Mean Plasma Glucose: 212 mg/dL

## 2021-06-10 LAB — GLUCOSE, CAPILLARY
Glucose-Capillary: 214 mg/dL — ABNORMAL HIGH (ref 70–99)
Glucose-Capillary: 229 mg/dL — ABNORMAL HIGH (ref 70–99)
Glucose-Capillary: 303 mg/dL — ABNORMAL HIGH (ref 70–99)
Glucose-Capillary: 220 mg/dL — ABNORMAL HIGH (ref 70–99)

## 2021-06-10 LAB — MAGNESIUM: Magnesium: 2.3 mg/dL (ref 1.7–2.4)

## 2021-06-10 MED ORDER — ENOXAPARIN SODIUM 30 MG/0.3ML IJ SOSY
30.0000 mg | PREFILLED_SYRINGE | INTRAMUSCULAR | Status: DC
Start: 2021-06-10 — End: 2021-06-13
  Administered 2021-06-10 – 2021-06-12 (×3): 30 mg via SUBCUTANEOUS
  Filled 2021-06-10 (×3): qty 0.3

## 2021-06-10 MED ORDER — FLUCONAZOLE IN SODIUM CHLORIDE 200-0.9 MG/100ML-% IV SOLN
INTRAVENOUS | Status: AC
  Filled 2021-06-10: qty 200

## 2021-06-10 MED ORDER — FLUCONAZOLE IN SODIUM CHLORIDE 200-0.9 MG/100ML-% IV SOLN
200.0000 mg | INTRAVENOUS | Status: DC
  Administered 2021-06-11 – 2021-06-12 (×2): 200 mg via INTRAVENOUS
  Filled 2021-06-10 (×3): qty 100

## 2021-06-10 MED ORDER — FLUCONAZOLE IN SODIUM CHLORIDE 400-0.9 MG/200ML-% IV SOLN
400.0000 mg | Freq: Once | INTRAVENOUS | Status: DC
  Filled 2021-06-10: qty 200

## 2021-06-10 MED ORDER — FLUCONAZOLE IN SODIUM CHLORIDE 200-0.9 MG/100ML-% IV SOLN
200.0000 mg | INTRAVENOUS | Status: AC
  Administered 2021-06-10 (×2): 200 mg via INTRAVENOUS
  Filled 2021-06-10 (×2): qty 100

## 2021-06-10 MED ORDER — INSULIN GLARGINE 100 UNIT/ML ~~LOC~~ SOLN
14.0000 [IU] | Freq: Every day | SUBCUTANEOUS | Status: DC
  Administered 2021-06-11 – 2021-06-13 (×3): 14 [IU] via SUBCUTANEOUS
  Filled 2021-06-10 (×5): qty 0.14

## 2021-06-10 MED ORDER — INSULIN ASPART 100 UNIT/ML IJ SOLN
5.0000 [IU] | Freq: Three times a day (TID) | INTRAMUSCULAR | Status: DC
  Administered 2021-06-10 – 2021-06-13 (×9): 5 [IU] via SUBCUTANEOUS

## 2021-06-10 NOTE — Progress Notes (Signed)
RN called due to patient's blood culture being positive for candidemia.  Fluconazole per pharmacy dosing was started.

## 2021-06-10 NOTE — Progress Notes (Signed)
Checked with patient to see if he was still refusing CPAP, patient stated that he hasn't used one in a long time and did not want it.

## 2021-06-10 NOTE — Plan of Care (Addendum)
  Problem: Acute Rehab PT Goals(only PT should resolve) Goal: Pt will Roll Supine to Side Outcome: Progressing Flowsheets (Taken 06/10/2021 1612) Pt will Roll Supine to Side: Independently Goal: Pt Will Go Sit To Supine/Side Outcome: Progressing Flowsheets (Taken 06/10/2021 1612) Pt will go Sit to Supine/Side: with modified independence Goal: Patient Will Transfer Sit To/From Stand Outcome: Progressing Flowsheets (Taken 06/10/2021 1612) Patient will transfer sit to/from stand: with modified independence Goal: Pt Will Transfer Bed To Chair/Chair To Bed Outcome: Progressing Flowsheets (Taken 06/10/2021 1612) Pt will Transfer Bed to Chair/Chair to Bed: with modified independence Goal: Pt Will Ambulate Outcome: Progressing Flowsheets (Taken 06/10/2021 1612) Pt will Ambulate:  50 feet  with modified independence  with least restrictive assistive device    4:13 PM, 06/10/21 Jeneen Rinks Cousler SPT  4:16 PM, 06/10/21 Lonell Grandchild, MPT Physical Therapist with [facility] [phone] office [phone] mobile phone

## 2021-06-10 NOTE — Progress Notes (Signed)
Pharmacy Antibiotic Note  TELESFORO BROSNAHAN is a 87 y.o. male admitted on 06/08/2021 with sepsis.  Pharmacy has been consulted for Fluconazole dosing for Candida Albicans Bacteremia. WBC mildly elevated. Renal dysfunction requiring dosage adjustment.   7/4 Blood culture>>>Candida Albicans  Plan: Fluconazole 400 mg IV x 1, then 200 mg IV q24h Trend WBC, temp, renal function  F/U infectious work-up   Height: 6\' 2"  (188 cm) Weight: 70 kg (154 lb 5.2 oz) IBW/kg (Calculated) : 82.2  Temp (24hrs), Avg:100.5 F (38.1 C), Min:99.2 F (37.3 C), Max:102.7 F (39.3 C)  Recent Labs  Lab 06/08/21 1856 06/09/21 0020 06/09/21 0517  WBC 11.1*  --  11.5*  CREATININE 1.58*  --  1.55*  LATICACIDVEN 1.7 1.6  --     Estimated Creatinine Clearance: 33.2 mL/min (A) (by C-G formula based on SCr of 1.55 mg/dL (H)).    Allergies  Allergen Reactions   Broccoli [Brassica Oleracea] Nausea And Vomiting   Celery Oil Nausea And Vomiting    Narda Bonds, PharmD, BCPS Clinical Pharmacist Phone: [phone]

## 2021-06-10 NOTE — Progress Notes (Signed)
Pt in hospital       

## 2021-06-10 NOTE — Progress Notes (Signed)
Family was feeding patient breakfast, and yelled out that patient was vomiting. Patient found to have emesis that looked like the eggs he had been eating for breakfast. No changes in breathing. PRN Zofran given to patient IV. Wife stated patient was complaining of pain in his stomach, PRN tylenol suppository given. Holding food/PO meds until speech eval again.

## 2021-06-10 NOTE — Evaluation (Signed)
Modified Barium Swallow Progress Note  Patient Details  Name: Luke Solis MRN: [id number] Date of Birth: March 09, 1931  Today's Date: 06/10/2021  Modified Barium Swallow completed.  Full report located under Chart Review in the Imaging Section.  Brief recommendations include the following:  Clinical Impression  Pt presents with mild sensorimotor oropharyngeal dysphagia characterized by trace to mild silent aspiration with large presentations of thin liquids; also note slightly decreased oral coordination of bolus questionably related to decreased strength. Two visualized episodes of silent aspiration, one with large cup presentations and the second with consecutive sips via straw note premature spillage to the pyriforms resulting in trace to mild silent aspiration of thin liquids. Occasional flash penetration but no aspiration of thin liquids was noted with smaller sips and Pt benefits from the cue to hold liquids in oral cavity and swallow "hard and fast". Note mild valleculae residue across consistencies and trace pyriform residue. Pt did require additional time to thoroughly masticate solid textures; liquid wash facilitated AP transfer and swallowing trigger. Barium tablet was consumed without incident. Trials of NTL consumed without residue or penetration/aspiration. Note good epiglottic deflection and slightly decreased pharyngeal squeeze. Recommend continue with D3/mech soft diet (plan to upgrade to regular with next diet check if Pt continues to improve) and thin liquids with NO STRAWS and recommendation to take small sips. It is notable that in future hospitalizations or when Pt is deconditioned that a temporary downgrade to NTL may be indicated; this was also reviewed with Pt and wife. Reviewed recommendations and results with Pt and Pt's wife at length. Also reviewed recommendations with RN. ST will continue to follow acutely.   Swallow Evaluation Recommendations       SLP Diet  Recommendations: Dysphagia 3 (Mech soft) solids;Thin liquid   Liquid Administration via: Cup;Straw   Medication Administration: Whole meds with liquid       Compensations: Slow rate;Small sips/bites;Lingual sweep for clearance of pocketing;Multiple dry swallows after each bite/sip;Follow solids with liquid   Postural Changes: Remain semi-upright after after feeds/meals (Comment);Seated upright at 90 degrees   Oral Care Recommendations: Oral care BID   Other Recommendations: Clarify dietary restrictions  Carlissa Pesola H. Roddie Mc, CCC-SLP Speech Language Pathologist   Wende Bushy 06/10/2021,4:30 PM

## 2021-06-10 NOTE — Progress Notes (Signed)
PROGRESS NOTE    Luke Solis  RSW:[id number] DOB: 12/09/1930 DOA: 06/08/2021 PCP: Kathyrn Drown, MD   Brief History:  87 year old male with a history of prostate cancer, urethral stricture, diabetes mellitus type 2, CKD stage III, hypertension, hyperlipidemia, OSA, stroke, coronary artery disease, cognitive impairment presenting with 1 to 2-day history of fever, abdominal pain, and confusion.  The patient is a poor historian secondary to his cognitive impairment.  History is obtained from review of the medical record and speaking with the patient's spouse.  She states that the patient had been on antibiotics (Bactrim) for about 3 weeks up until the end of June.  At the end of June, the patient began having some suprapubic pain and dysuria.  The patient contacted his PCP.  An outpatient ultrasound revealed a postvoid residual with a 479 cc.  He went to [facility] ED on 06/05/2021 where he had a postvoid residual of 650 cc.  A Foley catheter was placed.  The patient was sent home with cefpodoxime.  Urine culture during that ED visit grew 10,000-50,000 colonies of Candida albicans.  Patient spouse states that the patient began developing fevers, chills, nausea and continued suprapubic pain.  The patient had worsening confusion and generalized weakness.  As result, the patient was brought to emergency department for further evaluation.  Notably, the patient has had a chronic history of fecal incontinence.  In the emergency department, the patient had fever up to 1 to 2.9 F with tachycardia.  WBC increased to 11.5.  He was hemodynamically stable.  Patient was started on ceftriaxone. Blood culture positive for candidemia. Fluconazole IV was started 7/6. Further workup including TTE to r/o endocarditis was initiated; pending results. Will obtain ophthalmic evaluation to r/o endo-ophthalmitis.  Assessment & Plan:   Principal Problem:   UTI (urinary tract infection) Active Problems:   Hyperlipidemia   Essential  hypertension, benign   OSA on CPAP   Poorly controlled type 2 diabetes mellitus with peripheral neuropathy (HCC)   SIRS (systemic inflammatory response syndrome) (HCC)   Hypokalemia   Leukocytosis   Hyperglycemia due to diabetes mellitus (HCC)   Chronic kidney disease   Sepsis due to undetermined organism (HCC)   CKD (chronic kidney disease) stage 3, GFR 30-59 ml/min (HCC)  Sepsis - RESOLVED  -Present on admission -Presented with leukocytosis, fever, Tachycardia - MRSA negative, Urine and blood culture 7/4 grew Candida albicans. -Today, hemodynamically improving with slight tachypnea, HR ~ 90s, satting appropriately -Secondary to urinary source; per ID, ruling out prostatitis (do not suspect at this time) -Lactic acid peaked 1.7 -Continue IV fluids but reduce rate  -SEPSIS PHYSIOLOGY RESOLVED NOW   Candidemia  - Blood culture collected on 7/4 grew C. Albicans - Possibly from urinary source - Diflucan IV 200mg  - IDSA recommends 14 days after documentation of blood culture clearance - Recollect blood culture tomorrow  - Evaluate for prostatitis - patient had prostate cancer s/p TURP (not candidate for radical prostatectomy)  - F/u fundoscopic exam to evaluate for endoophthalmitis  - F/u transthoracic echo 7/6 to evaluate for endocarditis  CAUTI - catheter replaced - d/c zosyn coverage   Urethral stricture -Follow-up at [facility] -Last dilated 03/26/2021   CKD stage IIIb -Baseline creatinine 0.9-3.8   Acute metabolic encephalopathy -Secondary to sepsis -Patient has underlying cognitive impairment -06/10/2021--more awake and alert, but still slow to respond   Uncontrolled diabetes mellitus type 2 with hyperglycemia -NovoLog sliding scale -03/25/2021 hemoglobin A1c 10.9 - BG 210-320; today 229 -Hold metformin -Lantus  increased dose to 14 units, plus added novolog 5 units TIDWC if eats 50% of meal, increase CBG monitoring to 5x/day.   Essential hypertension -Continue  amlodipine   Hyperlipidemia -Continue Crestor   Hypokalemia/Hypomagnesemia - resolved -repleted  DVT prophylaxis: enoxaparin Code Status: Full  Family Communication: wife at bedside  Disposition Plan: home   Consultants:  Infectious Disease by telephone   Procedures:  F/u transthoracic echo 7/6   Antimicrobials: Diflucan IV (start 7/6)>>   Subjective: Patient was evaluated at bedside with wife and daughter present. Wife states patient's catheter has been replaced/changed. Patient has no acute complaint today.   Objective: Vitals:   06/10/21 0900 06/10/21 1000 06/10/21 1009 06/10/21 1132  BP: (!) 166/62 (!) 147/67    Pulse: 68 60 63 96  Resp: (!) 32  16 (!) 21  Temp:    (!) 97.5 F (36.4 C)  TempSrc:    Oral  SpO2: 90% 95% 93% 94%  Weight:      Height:        Intake/Output Summary (Last 24 hours) at 06/10/2021 1430 Last data filed at 06/10/2021 0950 Gross per 24 hour  Intake 1136.6 ml  Output 1325 ml  Net -188.4 ml   Filed Weights   06/08/21 1815 06/09/21 0557 06/10/21 0544  Weight: 72.1 kg 70 kg 72.6 kg    Examination:  General exam: Appears in no acute distress. Respiratory system: Fine bibasilar rales, no wheezes Cardiovascular system: S1 & S2 heard, RRR. No JVD, murmurs, rubs, gallops or clicks. No pedal edema. Gastrointestinal system: Abdomen is nondistended, soft and nontender. No organomegaly or masses felt. Normal bowel sounds heard. Central nervous system: Alert and oriented x3. Extremities: 2+ pedal pulses.  Skin: No new rashes, lesions or ulcers Psychiatry: Mood appropriate  Data Reviewed: I have personally reviewed following labs and imaging studies  CBC: Recent Labs  Lab 06/08/21 1856 06/09/21 0517 06/10/21 0405  WBC 11.1* 11.5* 13.6*  NEUTROABS 9.8*  --   --   HGB 13.9 11.4* 12.1*  HCT 42.5 33.9* 37.6*  MCV 93.6 92.1 94.9  PLT 189 144* 188   Basic Metabolic Panel: Recent Labs  Lab 06/08/21 1856 06/09/21 0517 06/10/21 0405   NA 138 137 140  K 3.3* 3.3* 3.8  CL 101 104 105  CO2 25 22 24   GLUCOSE 275* 245* 226*  BUN 25* 25* 34*  CREATININE 1.58* 1.55* 1.81*  CALCIUM 9.3 8.5* 8.6*  MG  --  1.6* 2.3  PHOS  --  1.6*  --    GFR: Estimated Creatinine Clearance: 29.5 mL/min (A) (by C-G formula based on SCr of 1.81 mg/dL (H)). Liver Function Tests: Recent Labs  Lab 06/08/21 1856 06/09/21 0517  AST 49* 63*  ALT 32 52*  ALKPHOS 88 73  BILITOT 1.1 0.7  PROT 8.4* 6.5  ALBUMIN 3.7 2.8*   No results for input(s): LIPASE, AMYLASE in the last 168 hours. No results for input(s): AMMONIA in the last 168 hours. Coagulation Profile: Recent Labs  Lab 06/08/21 1856 06/09/21 0517  INR 1.0 1.2   Cardiac Enzymes: No results for input(s): CKTOTAL, CKMB, CKMBINDEX, TROPONINI in the last 168 hours. BNP (last 3 results) No results for input(s): PROBNP in the last 8760 hours. HbA1C: Recent Labs    06/08/21 1856  HGBA1C 9.0*   CBG: Recent Labs  Lab 06/09/21 1058 06/09/21 1622 06/09/21 2050 06/10/21 0831 06/10/21 1134  GLUCAP 240* 323* 210* 214* 229*   Lipid Profile: No results for input(s): CHOL, HDL,  LDLCALC, TRIG, CHOLHDL, LDLDIRECT in the last 72 hours. Thyroid Function Tests: No results for input(s): TSH, T4TOTAL, FREET4, T3FREE, THYROIDAB in the last 72 hours. Anemia Panel: No results for input(s): VITAMINB12, FOLATE, FERRITIN, TIBC, IRON, RETICCTPCT in the last 72 hours. Urine analysis:    Component Value Date/Time   COLORURINE YELLOW 06/08/2021 1930   APPEARANCEUR CLOUDY (A) 06/08/2021 1930   LABSPEC 1.018 06/08/2021 1930   PHURINE 5.0 06/08/2021 1930   GLUCOSEU >=500 (A) 06/08/2021 1930   HGBUR LARGE (A) 06/08/2021 1930   BILIRUBINUR NEGATIVE 06/08/2021 1930   BILIRUBINUR + 12/22/2018 1540   KETONESUR NEGATIVE 06/08/2021 1930   PROTEINUR 100 (A) 06/08/2021 1930   UROBILINOGEN 0.2 09/11/2007 1600   NITRITE NEGATIVE 06/08/2021 1930   LEUKOCYTESUR LARGE (A) 06/08/2021 1930   Recent  Results (from the past 240 hour(s))  Resp Panel by RT-PCR (Flu A&B, Covid) Nasopharyngeal Swab     Status: None   Collection Time: 06/08/21  6:22 PM   Specimen: Nasopharyngeal Swab; Nasopharyngeal(NP) swabs in vial transport medium  Result Value Ref Range Status   SARS Coronavirus 2 by RT PCR NEGATIVE NEGATIVE Final    Comment: (NOTE) SARS-CoV-2 target nucleic acids are NOT DETECTED.  The SARS-CoV-2 RNA is generally detectable in upper respiratory specimens during the acute phase of infection. The lowest concentration of SARS-CoV-2 viral copies this assay can detect is 138 copies/mL. A negative result does not preclude SARS-Cov-2 infection and should not be used as the sole basis for treatment or other patient management decisions. A negative result may occur with  improper specimen collection/handling, submission of specimen other than nasopharyngeal swab, presence of viral mutation(s) within the areas targeted by this assay, and inadequate number of viral copies(<138 copies/mL). A negative result must be combined with clinical observations, patient history, and epidemiological information. The expected result is Negative.  Fact Sheet for Patients:  EntrepreneurPulse.com.au  Fact Sheet for Healthcare Providers:  IncredibleEmployment.be  This test is no t yet approved or cleared by the [place] FDA and  has been authorized for detection and/or diagnosis of SARS-CoV-2 by FDA under an Emergency Use Authorization (EUA). This EUA will remain  in effect (meaning this test can be used) for the duration of the COVID-19 declaration under Section 564(b)(1) of the Act, 21 U.S.C.section 360bbb-3(b)(1), unless the authorization is terminated  or revoked sooner.       Influenza A by PCR NEGATIVE NEGATIVE Final   Influenza B by PCR NEGATIVE NEGATIVE Final    Comment: (NOTE) The Xpert Xpress SARS-CoV-2/FLU/RSV plus assay is intended as an aid in the  diagnosis of influenza from Nasopharyngeal swab specimens and should not be used as a sole basis for treatment. Nasal washings and aspirates are unacceptable for Xpert Xpress SARS-CoV-2/FLU/RSV testing.  Fact Sheet for Patients: EntrepreneurPulse.com.au  Fact Sheet for Healthcare Providers: IncredibleEmployment.be  This test is not yet approved or cleared by the [place] FDA and has been authorized for detection and/or diagnosis of SARS-CoV-2 by FDA under an Emergency Use Authorization (EUA). This EUA will remain in effect (meaning this test can be used) for the duration of the COVID-19 declaration under Section 564(b)(1) of the Act, 21 U.S.C. section 360bbb-3(b)(1), unless the authorization is terminated or revoked.  Performed at [facility], [address] [place], [place] [address]   Blood Culture (routine x 2)     Status: None (Preliminary result)   Collection Time: 06/08/21  6:40 PM   Specimen: BLOOD  Result Value Ref Range Status  Specimen Description   Final    BLOOD LEFT ANTECUBITAL Performed at [facility], [address] [place], [place] [address]    Special Requests   Final    BOTTLES DRAWN AEROBIC AND ANAEROBIC Blood Culture adequate volume Performed at [facility], [address] [place], [place] [address]    Culture  Setup Time   Final    AEROBIC BOTTLE ONLY YEAST Gram Stain Report Called to,Read Back By and Verified With: R MAYNARD,RN@[id number]  06/09/21 MKELLY CRITICAL RESULT CALLED TO, READ BACK BY AND VERIFIED WITHShawnee Knapp RN, AT [phone] 06/10/21 D.VANHOOK Performed at [facility], [address], [place], [place] [address]    Culture YEAST  Final   Report Status PENDING  Incomplete  Blood Culture ID Panel (Reflexed)     Status: Abnormal   Collection Time: 06/08/21  6:40 PM  Result Value Ref Range Status   Enterococcus faecalis NOT DETECTED NOT DETECTED Final   Enterococcus Faecium NOT DETECTED NOT DETECTED  Final   Listeria monocytogenes NOT DETECTED NOT DETECTED Final   Staphylococcus species NOT DETECTED NOT DETECTED Final   Staphylococcus aureus (BCID) NOT DETECTED NOT DETECTED Final   Staphylococcus epidermidis NOT DETECTED NOT DETECTED Final   Staphylococcus lugdunensis NOT DETECTED NOT DETECTED Final   Streptococcus species NOT DETECTED NOT DETECTED Final   Streptococcus agalactiae NOT DETECTED NOT DETECTED Final   Streptococcus pneumoniae NOT DETECTED NOT DETECTED Final   Streptococcus pyogenes NOT DETECTED NOT DETECTED Final   A.calcoaceticus-baumannii NOT DETECTED NOT DETECTED Final   Bacteroides fragilis NOT DETECTED NOT DETECTED Final   Enterobacterales NOT DETECTED NOT DETECTED Final   Enterobacter cloacae complex NOT DETECTED NOT DETECTED Final   Escherichia coli NOT DETECTED NOT DETECTED Final   Klebsiella aerogenes NOT DETECTED NOT DETECTED Final   Klebsiella oxytoca NOT DETECTED NOT DETECTED Final   Klebsiella pneumoniae NOT DETECTED NOT DETECTED Final   Proteus species NOT DETECTED NOT DETECTED Final   Salmonella species NOT DETECTED NOT DETECTED Final   Serratia marcescens NOT DETECTED NOT DETECTED Final   Haemophilus influenzae NOT DETECTED NOT DETECTED Final   Neisseria meningitidis NOT DETECTED NOT DETECTED Final   Pseudomonas aeruginosa NOT DETECTED NOT DETECTED Final   Stenotrophomonas maltophilia NOT DETECTED NOT DETECTED Final   Candida albicans DETECTED (A) NOT DETECTED Final    Comment: CRITICAL RESULT CALLED TO, READ BACK BY AND VERIFIED WITH: REnriqueta Shutter RN, AT [phone] 06/10/21 D.VANHOOK    Candida auris NOT DETECTED NOT DETECTED Final   Candida glabrata NOT DETECTED NOT DETECTED Final   Candida krusei NOT DETECTED NOT DETECTED Final   Candida parapsilosis NOT DETECTED NOT DETECTED Final   Candida tropicalis NOT DETECTED NOT DETECTED Final   Cryptococcus neoformans/gattii NOT DETECTED NOT DETECTED Final    Comment: Performed at [facility], [address], [place],  [address]  Blood Culture (routine x 2)     Status: None (Preliminary result)   Collection Time: 06/08/21  6:57 PM   Specimen: BLOOD  Result Value Ref Range Status   Specimen Description BLOOD RIGHT ANTECUBITAL  Final   Special Requests   Final    BOTTLES DRAWN AEROBIC AND ANAEROBIC Blood Culture adequate volume   Culture  Setup Time   Final    IN BOTH AEROBIC AND ANAEROBIC BOTTLES YEAST Gram Stain Report Called to,Read Back By and Verified With: R MAYNARD,RN@[id number]  06/09/21 MKELLY    Culture   Final    NO GROWTH 2 DAYS Performed  at [facility], [address] [place], [place] [address]    Report Status PENDING  Incomplete  Urine culture     Status: Abnormal   Collection Time: 06/08/21  7:30 PM   Specimen: In/Out Cath Urine  Result Value Ref Range Status   Specimen Description   Final    IN/OUT CATH URINE Performed at [facility], [address] [place], [place] [address]    Special Requests   Final    NONE Performed at [facility], [address] [place], [place] [address]    Culture >=100,000 COLONIES/mL YEAST (A)  Final   Report Status 06/10/2021 FINAL  Final  MRSA Next Gen by PCR, Nasal     Status: None   Collection Time: 06/09/21  5:51 AM   Specimen: Nasal Mucosa; Nasal Swab  Result Value Ref Range Status   MRSA by PCR Next Gen NOT DETECTED NOT DETECTED Final    Comment: (NOTE) The GeneXpert MRSA Assay (FDA approved for NASAL specimens only), is one component of a comprehensive MRSA colonization surveillance program. It is not intended to diagnose MRSA infection nor to guide or monitor treatment for MRSA infections. Test performance is not FDA approved in patients less than 67 years old. Performed at [facility], [address], [place], [place] [address]     Radiology Studies: [facility] Chest [facility] 1 View  Result Date: 06/08/2021 CLINICAL DATA:  Possible sepsis fever EXAM: PORTABLE CHEST 1 VIEW COMPARISON:  04/01/2015, 06/19/2020 FINDINGS: No focal  opacity or pleural effusion. Stable cardiomediastinal silhouette with aortic atherosclerosis. No pneumothorax. IMPRESSION: No active disease. Electronically Signed   By: Donavan Foil M.D.   On: 06/08/2021 18:43    Scheduled Meds:  amLODipine  2.5 mg Oral Daily   calcium-vitamin D  1 tablet Oral Q breakfast   Chlorhexidine Gluconate Cloth  6 each Topical Daily   enoxaparin (LOVENOX) injection  30 mg Subcutaneous Q24H   insulin aspart  0-5 Units Subcutaneous QHS   insulin aspart  0-9 Units Subcutaneous TID WC   insulin glargine  10 Units Subcutaneous Daily   multivitamin with minerals  1 tablet Oral Daily   Continuous Infusions:  0.9 % NaCl with KCl 20 mEq / L Stopped (06/10/21 0829)   [START ON 06/11/2021] fluconazole (DIFLUCAN) IV      LOS: 2 days   Time spent: 30 minutes  Fredrik Rigger, Medical student [facility] Pager [phone]  If 7PM-7AM, please contact night-coverage [email or web address] Password [email or web address] 06/10/2021, 2:30 PM  ______________________________________________________________________  Patient seen and examined with Fredrik Rigger, Medical student. In addition to supervising the encounter, I played a key role in the decision making process as well as reviewed key findings.  Plan is to work up and try to rule out endocarditis.  Continue IV fluconazole.  Repeat BC 7/7.  Foley has been changed out since admission.  Please see orders. I agree with documentation noted above and made edits and adjustments.   Murvin Natal, MD FAAFP Attending Physician  [facility] How to contact the [facility] Attending or Consulting provider [place] or covering provider during after hours [place], for this patient?  Check the care team in [facility] and look for a) attending/consulting TR[facility] provider listed and b) the [facility] team listed Log into [email or web address] and use [place]'s universal password to access. If you do not have the password, please contact the hospital operator. Locate the [facility] provider you  are looking for under [facility] and page to a  number that you can be directly reached. If you still have difficulty reaching the provider, please page the [facility] (Director on Call) for the Hospitalists listed on amion for assistance.

## 2021-06-10 NOTE — Progress Notes (Signed)
  Speech Language Pathology Treatment: Dysphagia  Patient Details Name: Luke Solis MRN: [id number] DOB: [birth year] Today's Date: 06/10/2021 Time: 2449-7530 SLP Time Calculation (min) (ACUTE ONLY): 24 min  Assessment / Plan / Recommendation Clinical Impression  Pt was provided ongoing diagnostic dysphagia therapy targeting PO trials. Pt's wife was present for evaluating echoing report that Pt occasionally coughs with liquids at home for ~ the last 6 months. Again note no hx of PNA and CXR upon admission 06/08/21 is clear. With trials of ice chips, cup sips and straw sips of thin liquids note inconsistent throat clearing, occasional audible swallowing, delayed throat clearing and occasional delayed coughing; further note watering eyes during and after trials. With trials of regular textures Pt demonstrated mildly decreased coordination of oral stage and mild oral residue after the swallow; liquid wash facilitated clearance of residue. Also note episode of emesis during breakfast this am. Secondary to s/sx observed above and hx of coughing with thin liquids at home, Recommend proceed with MBSS later this am in order to objectively assess the swallowing function.    HPI HPI: Luke Solis is a 87 y.o. male with medical history significant for prostate cancer (follows with [facility] urology), T2DM, CKD stage 3A/B, hypertension, hyperlipidemia who resented to the ED accompanied by wife due to fever and abdominal pain.  Patient was recently treated for UTI (cefpodoxime) by his urologist about a week ago, few days ago, he continued to have suprapubic pain with difficulty in urination, PCP was consulted and recommended a bladder ultrasound after which patient was asked to go to an ED for Foley catheterization due to urinary retention, this was placed at [facility] ED on 06/05/2021, this morning patient was noted to be weak and as the day progressed, he became febrile and started to have chills, patient's urologist  was contacted by phone and recommended that patient should go to the ED for further evaluation and management.  Patient denies nausea, vomiting, chest pain or shortness of breath. BSE requested due to Pt clearing throat and coughing after po per RN.      SLP Plan  Continue with current plan of care;MBS       Recommendations  Diet recommendations: Regular;Thin liquid Liquids provided via: Cup;Straw Medication Administration: Whole meds with liquid Supervision: Staff to assist with self feeding;Intermittent supervision to cue for compensatory strategies Compensations: Slow rate;Small sips/bites;Lingual sweep for clearance of pocketing;Multiple dry swallows after each bite/sip;Follow solids with liquid                Plan: Continue with current plan of care;MBS       Areon Cocuzza H. Roddie Mc, CCC-SLP Speech Language Pathologist        Wende Bushy 06/10/2021, 10:56 AM

## 2021-06-10 NOTE — Progress Notes (Signed)
Inpatient Diabetes Program Recommendations  AACE/ADA: New Consensus Statement on Inpatient Glycemic Control (2015)  Target Ranges:  Prepandial:   less than 140 mg/dL      Peak postprandial:   less than 180 mg/dL (1-2 hours)      Critically ill patients:  140 - 180 mg/dL   Lab Results  Component Value Date   GLUCAP 229 (H) 06/10/2021   HGBA1C 9.0 (H) 06/08/2021    Review of Glycemic Control  Diabetes history: DM2 Outpatient Diabetes medications: Lantus 16 units QHS, metformin 500 mg QAM Current orders for Inpatient glycemic control: Lantus 10 QD, Novolog 0-9 units TID with meals and 0-5 HS  HgbA1C - 9.0% Blood sugars above goal of 140-180 mg/dL  Inpatient Diabetes Program Recommendations:    Increase Lantus to 14 units QD Add Novolog 3 units TID with meals if eating > 50% meal  Follow daily glucose trends.   Thank you. Lorenda Peck, RD, LDN, CDE Inpatient Diabetes Coordinator [phone]

## 2021-06-10 NOTE — Evaluation (Addendum)
Physical Therapy Evaluation Patient Details Name: Luke Solis MRN: [id number] DOB: 12/07/1930 Today's Date: 06/10/2021   History of Present Illness  Luke Solis is a 87 y.o. male with medical history significant for prostate cancer (follows with [facility] urology), T2DM, CKD stage 3A/B, hypertension, hyperlipidemia who resented to the ED accompanied by wife due to fever and abdominal pain.  Patient was recently treated for UTI (cefpodoxime) by his urologist about a week ago, few days ago, he continued to have suprapubic pain with difficulty in urination, PCP was consulted and recommended a bladder ultrasound after which patient was asked to go to an ED for Foley catheterization due to urinary retention, this was placed at [facility] ED on 06/05/2021, this morning patient was noted to be weak and as the day progressed, he became febrile and started to have chills, patient's urologist was contacted by phone and recommended that patient should go to the ED for further evaluation and management.  Patient denies nausea, vomiting, chest pain or shortness of breath.   Clinical Impression  Patient presents in bed and agreeable to therapy with wife in the room. Patient demonstrated fair bed mobility with min/mod assist in transfer to sitting at EOB. Patient had fair seated balance and was able to self scoot towards the edge of the bed. Patient preformed a sit to stand with min assist and ambulate for 15 feet using a RW with noted unsteadiness with lateral and backwards movement. Patient was able to transfer to the chair to sit up for a few hours and eat dinner. Patient left in chair - nursing staff notified. Patient will benefit from continued physical therapy in hospital and recommended venue below to increase strength, balance, endurance for safe ADLs and gait.     Follow Up Recommendations SNF;Supervision - Intermittent;Supervision/Assistance - 24 hour    Equipment Recommendations  Rolling walker with  5" wheels    Recommendations for Other Services       Precautions / Restrictions Precautions Precautions: Fall Restrictions Weight Bearing Restrictions: No      Mobility  Bed Mobility Overal bed mobility: Modified Independent;Needs Assistance Bed Mobility: Supine to Sit     Supine to sit: Mod assist;Min assist     General bed mobility comments: slow labored movements Patient Response: Cooperative  Transfers Overall transfer level: Needs assistance Equipment used: Rolling walker (2 wheeled) Transfers: Sit to/from [facility] Sit to Stand: Min assist;Min guard Stand pivot transfers: Min assist;Min guard       General transfer comment: slow labored movements  Ambulation/Gait Ambulation/Gait assistance: Min assist;Min guard Gait Distance (Feet): 15 Feet Assistive device: Rolling walker (2 wheeled) Gait Pattern/deviations: Decreased step length - left;Decreased stance time - right;Decreased stride length Gait velocity: decreased   General Gait Details: slow labored movements with shuffle like gait pattern, wife reports that he normally walkes this way due to neuropathy  Stairs            Wheelchair Mobility    Modified Rankin (Stroke Patients Only)       Balance Overall balance assessment: Needs assistance Sitting-balance support: Feet supported;Bilateral upper extremity supported Sitting balance-Leahy Scale: Poor Sitting balance - Comments: poor/fair at EOB     Standing balance-Leahy Scale: Poor Standing balance comment: using RW                             Pertinent Vitals/Pain Pain Assessment: No/denies pain    Home Living Family/patient  expects to be discharged to:: Private residence Living Arrangements: Spouse/significant other Available Help at Discharge: Family;Friend(s);Available 24 hours/day Type of Home: House Home Access: Stairs to enter Entrance Stairs-Rails: Right Entrance Stairs-Number of Steps:  4 Home Layout: Multi-level;Able to live on main level with bedroom/bathroom Home Equipment: Shower seat;Grab bars - tub/shower Additional Comments: used walking sticks    Prior Function Level of Independence: Independent         Comments: community amublator with walking stick PRN     Hand Dominance   Dominant Hand: Right    Extremity/Trunk Assessment   Upper Extremity Assessment Upper Extremity Assessment: Generalized weakness    Lower Extremity Assessment Lower Extremity Assessment: Generalized weakness    Cervical / Trunk Assessment Cervical / Trunk Assessment: Kyphotic  Communication   Communication: No difficulties  Cognition Arousal/Alertness: Awake/alert Behavior During Therapy: WFL for tasks assessed/performed Overall Cognitive Status: Within Functional Limits for tasks assessed                                        General Comments      Exercises     Assessment/Plan    PT Assessment Patient needs continued PT services  PT Problem List Decreased strength;Decreased activity tolerance;Decreased balance;Decreased mobility;Decreased coordination;Decreased safety awareness       PT Treatment Interventions DME instruction;Gait training;Functional mobility training;Therapeutic activities;Therapeutic exercise;Balance training;Patient/family education    PT Goals (Current goals can be found in the Care Plan section)  Acute Rehab PT Goals Patient Stated Goal: return home PT Goal Formulation: With patient Time For Goal Achievement: 06/24/21 Potential to Achieve Goals: Good    Frequency Min 3X/week   Barriers to discharge        Co-evaluation               AM-PAC PT "6 Clicks" Mobility  Outcome Measure Help needed turning from your back to your side while in a flat bed without using bedrails?: A Little Help needed moving from lying on your back to sitting on the side of a flat bed without using bedrails?: A Little Help needed  moving to and from a bed to a chair (including a wheelchair)?: A Little Help needed standing up from a chair using your arms (e.g., wheelchair or bedside chair)?: A Lot Help needed to walk in hospital room?: A Lot Help needed climbing 3-5 steps with a railing? : A Lot 6 Click Score: 15    End of Session   Activity Tolerance: Patient tolerated treatment well;Patient limited by fatigue Patient left: in chair;with call bell/phone within reach;with family/visitor present Nurse Communication: Mobility status PT Visit Diagnosis: Unsteadiness on feet (R26.81);Other abnormalities of gait and mobility (R26.89);Difficulty in walking, not elsewhere classified (R26.2)    Time: 2585-2778 PT Time Calculation (min) (ACUTE ONLY): 23 min   Charges:   PT Evaluation $PT Eval Moderate Complexity: 1 Mod PT Treatments $Therapeutic Activity: 23-37 mins        4:17 PM, 06/10/21 Jeneen Rinks Cousler SPT  4:17 PM, 06/10/21 Lonell Grandchild, MPT Physical Therapist with [facility] [address] [phone] office [phone] mobile phone

## 2021-06-11 ENCOUNTER — Inpatient Hospital Stay (HOSPITAL_COMMUNITY): Payer: Medicare Other

## 2021-06-11 DIAGNOSIS — I10 Essential (primary) hypertension: Secondary | ICD-10-CM

## 2021-06-11 DIAGNOSIS — I340 Nonrheumatic mitral (valve) insufficiency: Secondary | ICD-10-CM

## 2021-06-11 LAB — CBC WITH DIFFERENTIAL/PLATELET
Abs Immature Granulocytes: 0.04 10*3/uL (ref 0.00–0.07)
Basophils Absolute: 0 10*3/uL (ref 0.0–0.1)
Basophils Relative: 0 %
Eosinophils Absolute: 0 10*3/uL (ref 0.0–0.5)
Eosinophils Relative: 0 %
HCT: 32.6 % — ABNORMAL LOW (ref 39.0–52.0)
Hemoglobin: 10.7 g/dL — ABNORMAL LOW (ref 13.0–17.0)
Immature Granulocytes: 0 %
Lymphocytes Relative: 8 %
Lymphs Abs: 0.9 10*3/uL (ref 0.7–4.0)
MCH: 30.2 pg (ref 26.0–34.0)
MCHC: 32.8 g/dL (ref 30.0–36.0)
MCV: 92.1 fL (ref 80.0–100.0)
Monocytes Absolute: 0.8 10*3/uL (ref 0.1–1.0)
Monocytes Relative: 8 %
Neutro Abs: 8.5 10*3/uL — ABNORMAL HIGH (ref 1.7–7.7)
Neutrophils Relative %: 84 %
Platelets: 143 10*3/uL — ABNORMAL LOW (ref 150–400)
RBC: 3.54 MIL/uL — ABNORMAL LOW (ref 4.22–5.81)
RDW: 12.7 % (ref 11.5–15.5)
WBC: 10.3 10*3/uL (ref 4.0–10.5)
nRBC: 0 % (ref 0.0–0.2)

## 2021-06-11 LAB — COMPREHENSIVE METABOLIC PANEL
ALT: 42 U/L (ref 0–44)
AST: 43 U/L — ABNORMAL HIGH (ref 15–41)
Albumin: 2.5 g/dL — ABNORMAL LOW (ref 3.5–5.0)
Alkaline Phosphatase: 77 U/L (ref 38–126)
Anion gap: 8 (ref 5–15)
BUN: 38 mg/dL — ABNORMAL HIGH (ref 8–23)
CO2: 26 mmol/L (ref 22–32)
Calcium: 8.5 mg/dL — ABNORMAL LOW (ref 8.9–10.3)
Chloride: 105 mmol/L (ref 98–111)
Creatinine, Ser: 1.73 mg/dL — ABNORMAL HIGH (ref 0.61–1.24)
GFR, Estimated: 38 mL/min — ABNORMAL LOW (ref >60)
Glucose, Bld: 156 mg/dL — ABNORMAL HIGH (ref 70–99)
Potassium: 3.2 mmol/L — ABNORMAL LOW (ref 3.5–5.1)
Sodium: 139 mmol/L (ref 135–145)
Total Bilirubin: 0.5 mg/dL (ref 0.3–1.2)
Total Protein: 6.1 g/dL — ABNORMAL LOW (ref 6.5–8.1)

## 2021-06-11 LAB — ECHOCARDIOGRAM COMPLETE BUBBLE STUDY
AV Mean grad: 4 mmHg
AV Peak grad: 8.6 mmHg
Ao pk vel: 1.47 m/s
Area-P 1/2: 3.54 cm2
S' Lateral: 2.57 cm

## 2021-06-11 LAB — GLUCOSE, CAPILLARY
Glucose-Capillary: 159 mg/dL — ABNORMAL HIGH (ref 70–99)
Glucose-Capillary: 146 mg/dL — ABNORMAL HIGH (ref 70–99)
Glucose-Capillary: 122 mg/dL — ABNORMAL HIGH (ref 70–99)
Glucose-Capillary: 170 mg/dL — ABNORMAL HIGH (ref 70–99)

## 2021-06-11 LAB — MAGNESIUM: Magnesium: 2.1 mg/dL (ref 1.7–2.4)

## 2021-06-11 MED ORDER — POTASSIUM CHLORIDE CRYS ER 20 MEQ PO TBCR
40.0000 meq | EXTENDED_RELEASE_TABLET | Freq: Once | ORAL | Status: AC
  Administered 2021-06-11: 40 meq via ORAL
  Filled 2021-06-11: qty 2

## 2021-06-11 MED ORDER — ACETAMINOPHEN 325 MG PO TABS
650.0000 mg | ORAL_TABLET | Freq: Four times a day (QID) | ORAL | Status: DC
  Administered 2021-06-11 – 2021-06-13 (×8): 650 mg via ORAL
  Filled 2021-06-11 (×7): qty 2

## 2021-06-11 MED ORDER — FENTANYL CITRATE (PF) 100 MCG/2ML IJ SOLN
12.5000 ug | INTRAMUSCULAR | Status: DC | PRN
  Administered 2021-06-11 (×2): 12.5 ug via INTRAVENOUS
  Filled 2021-06-11 (×2): qty 2

## 2021-06-11 MED ORDER — AMLODIPINE BESYLATE 5 MG PO TABS
5.0000 mg | ORAL_TABLET | Freq: Every day | ORAL | Status: DC
  Administered 2021-06-12 – 2021-06-13 (×2): 5 mg via ORAL
  Filled 2021-06-11 (×2): qty 1

## 2021-06-11 MED ORDER — HYDRALAZINE HCL 20 MG/ML IJ SOLN
5.0000 mg | INTRAMUSCULAR | Status: DC | PRN
  Administered 2021-06-11 – 2021-06-12 (×3): 5 mg via INTRAVENOUS
  Filled 2021-06-11 (×3): qty 1

## 2021-06-11 NOTE — Progress Notes (Signed)
  Speech Language Pathology Treatment: Dysphagia  Patient Details Name: Luke Solis MRN: [id number] DOB: 08-10-1931 Today's Date: 06/11/2021 Time: 9323-5573 SLP Time Calculation (min) (ACUTE ONLY): 17 min  Assessment / Plan / Recommendation Clinical Impression  Pt seen for ongoing dysphagia intervention following MBSS completed yesterday. Pt assessed with regular textures that wife brought in, as Pt refused to eat breakfast this morning of mechanical soft. Pt self fed a cheeseburger without incident. Results from [facility] reviewed with Pt and Pt not using a straw and independently observed taking small sips. Ok to advance to regular textures and thin liquids with aspiration and reflux precautions. No further SLP services indicated at this time. Pt/spouse in agreement with plan of care.    HPI HPI: Luke Solis is a 87 y.o. male with medical history significant for prostate cancer (follows with [facility] urology), T2DM, CKD stage 3A/B, hypertension, hyperlipidemia who resented to the ED accompanied by wife due to fever and abdominal pain.  Patient was recently treated for UTI (cefpodoxime) by his urologist about a week ago, few days ago, he continued to have suprapubic pain with difficulty in urination, PCP was consulted and recommended a bladder ultrasound after which patient was asked to go to an ED for Foley catheterization due to urinary retention, this was placed at [facility] ED on 06/05/2021, this morning patient was noted to be weak and as the day progressed, he became febrile and started to have chills, patient's urologist was contacted by phone and recommended that patient should go to the ED for further evaluation and management.  Patient denies nausea, vomiting, chest pain or shortness of breath. BSE requested due to Pt clearing throat and coughing after po per RN.      SLP Plan  All goals met;Discharge SLP treatment due to (comment)       Recommendations  Diet recommendations:  Regular;Thin liquid Liquids provided via: Cup;No straw Medication Administration: Whole meds with liquid Supervision: Staff to assist with self feeding;Intermittent supervision to cue for compensatory strategies Compensations: Slow rate;Small sips/bites;Lingual sweep for clearance of pocketing;Multiple dry swallows after each bite/sip;Follow solids with liquid Postural Changes and/or Swallow Maneuvers: Seated upright 90 degrees;Upright 30-60 min after meal                Oral Care Recommendations: Oral care BID;Staff/trained caregiver to provide oral care Follow up Recommendations: None SLP Visit Diagnosis: Dysphagia, unspecified (R13.10) Plan: All goals met;Discharge SLP treatment due to (comment)       Thank you,  Genene Churn, [place]                 [name] 06/11/2021, 6:37 PM

## 2021-06-11 NOTE — Progress Notes (Signed)
  Echocardiogram 2D Echocardiogram has been performed.  Elpidio Anis 06/11/2021, 4:23 PM

## 2021-06-11 NOTE — TOC Initial Note (Signed)
Transition of Care [facility]) - Initial/Assessment Note    Patient Details  Name: Luke Solis MRN: [id number] Date of Birth: Nov 23, 1931  Transition of Care [facility]) CM/SW Contact:    Salome Arnt, [place] Phone Number: 06/11/2021, 3:10 PM  Clinical Narrative:  Pt admitted due to UTI. LCSW met with pt, pt's wife, and pt's son at bedside. They report pt typically requires minimal assist with ADLs at baseline. PT evaluated pt yesterday and recommend SNF. LCSW discussed placement process and pt and family would like for pt to return home. Discussed that pt will likely require several weeks of IV antibiotics. They also feel comfortable handling this at home. Pt's son is available during day to assist as needed. Family request rolling walker and 3N1 through [employer] Fax sent and confirmed received with Tan at [facility]. Pt's son-in-law is Domingo Madeira- they will work out getting DME. Pt's family request home health through [facility]. Referral made and accepted. LCSW called Pam with [facility] to notify her of potential need for IV antibiotics. TOC will continue to follow.                  Expected Discharge Plan: [place] Barriers to Discharge: Continued Medical Work up   Patient Goals and CMS Choice Patient states their goals for this hospitalization and ongoing recovery are:: return home CMS Medicare.gov Compare Post Acute Care list provided to:: Patient Choice offered to / list presented to : Patient, Spouse, Adult Children  Expected Discharge Plan and Services Expected Discharge Plan: [place] In-house Referral: Clinical Social Work   Post Acute Care Choice: Durable Medical Equipment, Home Health Living arrangements for the past 2 months: [place]                 DME Arranged: 3-N-1, Walker rolling DME Agency: Other - Comment ([facility]) Date DME Agency Contacted: 06/11/21 Time DME Agency Contacted: [phone] Representative  spoke with at DME Agency: [facility] HH Arranged: RN, PT [place] Agency: [place] Date [place]: 06/11/21 Time [employer]: [id number] Representative spoke with at [place]: Santiago Glad  Prior Living Arrangements/Services Living arrangements for the past 2 months: Single Family Home Lives with:: Spouse Patient language and need for interpreter reviewed:: Yes Do you feel safe going back to the place where you live?: Yes      Need for Family Participation in Patient Care: Yes (Comment) Care giver support system in place?: Yes (comment) Current home services: DME (cane, wheelchair) Criminal Activity/Legal Involvement Pertinent to Current Situation/Hospitalization: No - Comment as needed  Activities of Daily Living Home Assistive Devices/Equipment: None ADL Screening (condition at time of admission) Patient's cognitive ability adequate to safely complete daily activities?: Yes Is the patient deaf or have difficulty hearing?: No Does the patient have difficulty seeing, even when wearing glasses/contacts?: No Does the patient have difficulty concentrating, remembering, or making decisions?: Yes Patient able to express need for assistance with ADLs?: No Does the patient have difficulty dressing or bathing?: Yes Independently performs ADLs?: No Communication: Independent Dressing (OT): Needs assistance Is this a change from baseline?: Pre-admission baseline Grooming: Needs assistance Is this a change from baseline?: Pre-admission baseline Feeding: Independent Bathing: Needs assistance Is this a change from baseline?: Pre-admission baseline Toileting: Needs assistance Is this a change from baseline?: Pre-admission baseline In/Out Bed: Needs assistance Is this a change from baseline?: Pre-admission baseline Walks in Home: Independent Does the patient have difficulty walking  or climbing stairs?: Yes Weakness of Legs: Both Weakness of Arms/Hands: None  Permission  Sought/Granted                  Emotional Assessment   Attitude/Demeanor/Rapport: Engaged Affect (typically observed): Accepting   Alcohol / Substance Use: Not Applicable Psych Involvement: No (comment)  Admission diagnosis:  UTI (urinary tract infection) [N39.0] Acute cystitis with hematuria [N30.01] Patient Active Problem List   Diagnosis Date Noted   Sepsis due to undetermined organism ([place]) 06/09/2021   CKD (chronic kidney disease) stage 3, GFR 30-59 ml/min ([place]) 06/09/2021   UTI (urinary tract infection) 06/08/2021   SIRS (systemic inflammatory response syndrome) ([place]) 06/08/2021   Hypokalemia 06/08/2021   Leukocytosis 06/08/2021   Hyperglycemia due to diabetes mellitus ([place]) 06/08/2021   Chronic kidney disease 06/08/2021   Poorly controlled type 2 diabetes mellitus with peripheral neuropathy ([place]) 08/11/2019   OSA on CPAP 09/05/2018   Altered mental status 02/28/2018   Anemia 02/28/2018   Fatigue 08/03/2017   Prostate cancer ([place]) 10/22/2015   Forgetfulness 04/29/2015   Type 2 diabetes mellitus with diabetic neuropathy ([place]) 07/17/2013   Hyperlipidemia 01/19/2010   OSA (obstructive sleep apnea) 01/19/2010   Essential hypertension, benign 01/19/2010   Coronary atherosclerosis of native coronary artery 01/19/2010   PCP:  Kathyrn Drown, MD Pharmacy:   [place] [place] [name] [place] [address] Phone: [phone] Fax: [place], [place] [address] [place] [address] Phone: [phone] Fax: [phone]  [facility] [place], [place] [address] Phone: [phone] Fax: [phone]  [id number] - [place], [address] [place]  [address] Phone: [phone] Fax: [phone]     Social Determinants of Health (SDOH) Interventions    Readmission Risk Interventions No flowsheet data found.

## 2021-06-11 NOTE — Progress Notes (Signed)
No [place] close note

## 2021-06-11 NOTE — Progress Notes (Signed)
Changed Foley Catheter this morning per verbal from Dr Wynetta Emery. 16 French foley placed. When removing previous catheter, noted draining pink colored on towel. Cleaned perineal area and placed new foley without difficulty with no resistance noted. Blood color draining into foley bag noted then color is lighter and cloudy colored now. Patient no complaints of pain while inserting foley. Dr Wynetta Emery made aware of changing of foley and outcome. No new orders currently and will continue to monitor. No urine collected at time of change of foley per verbal from Dr Wynetta Emery.

## 2021-06-11 NOTE — Progress Notes (Signed)
   87 year old male with a history of prostate cancer, urethral stricture, diabetes mellitus type 2, CKD stage III, hypertension, hyperlipidemia, OSA, stroke, coronary artery disease, cognitive impairment presenting with 1 to 2-day history of fever, abdominal pain, and confusion found to have c.albicans UTI with secondary fungemia     [place] Antimicrobial Management Team Fungemia   Fungemia is associated with a high rate of complications and mortality.  Specific aspects of clinical management are critical to optimizing the outcome of patients with candidemia.  Therefore, the [facility] Antimicrobial Management Team [facility]) has initiated an intervention aimed at improving the management of fungal bloodstream infection at [facility].  To do so, Infectious Diseases physicians are providing an evidence-based consult for the management of all patients with SAB.     Yes No Comments  Perform follow-up blood cultures (even if the patient is afebrile) to ensure clearance of bacteremia x []  7/7 - incubating  Remove vascular catheter and obtain follow-up blood cultures after the removal of the catheter []  [x]  He will need his urinary catheter exchanged by urology  Perform echocardiography to evaluate for endocarditis (transthoracic ECHO is 40-50% sensitive, TEE is > 90% sensitive) [x]  []  Please keep in mind, that neither test can definitively EXCLUDE endocarditis, and that should clinical suspicion remain high for endocarditis the patient should then still be treated with an "endocarditis" duration of therapy = 6 weeks       Ensure source control []  [x]  Have all abscesses been drained effectively? Have deep seeded infections (septic joints or osteomyelitis) had appropriate surgical debridement?  Investigate for "metastatic" sites of infection []  [x]  Does the patient have ANY symptom or physical exam finding that would suggest a deeper infection (back or neck pain that may be suggestive of vertebral  osteomyelitis or epidural abscess, muscle pain that could be a symptom of pyomyositis)?  Keep in mind that for deep seeded infections MRI imaging with contrast is preferred rather than other often insensitive tests such as plain x-rays, especially early in a patient's presentation.  Change antibiotic therapy to ____fluconazole 200mg  IV due to renal adjustment_________ []  []  Beta-lactam antibiotics are preferred for MSSA due to higher cure rates.   If on Vancomycin, goal trough should be 15 - 20 mcg/mL  Estimated duration of IV antibiotic therapy:  minimum of 2 weeks possibly longer pending work up []  []  Consult case management for probably prolonged outpatient IV antibiotic therapy

## 2021-06-11 NOTE — Progress Notes (Signed)
PROGRESS NOTE    Luke Solis  YHC:[id number] DOB: November 03, 1931 DOA: 06/08/2021 PCP: Kathyrn Drown, MD  Brief History:  87 year old male with a history of prostate cancer, urethral stricture, diabetes mellitus type 2, CKD stage III, hypertension, hyperlipidemia, OSA, stroke, coronary artery disease, cognitive impairment presenting with 1 to 2-day history of fever, abdominal pain, and confusion.  The patient is a poor historian secondary to his cognitive impairment.  History is obtained from review of the medical record and speaking with the patient's spouse.  She states that the patient had been on antibiotics (Bactrim) for about 3 weeks up until the end of June.  At the end of June, the patient began having some suprapubic pain and dysuria.  The patient contacted his PCP.  An outpatient ultrasound revealed a postvoid residual with a 479 cc.  He went to [facility] ED on 06/05/2021 where he had a postvoid residual of 650 cc.  A Foley catheter was placed.  The patient was sent home with cefpodoxime.  Urine culture during that ED visit grew 10,000-50,000 colonies of Candida albicans.  Patient spouse states that the patient began developing fevers, chills, nausea and continued suprapubic pain.  The patient had worsening confusion and generalized weakness.  As result, the patient was brought to emergency department for further evaluation.  Notably, the patient has had a chronic history of fecal incontinence.  In the emergency department, the patient had fever up to 102.9 F with tachycardia.  WBC increased to 11.5 and have downtrended to normal value (10.3) on 7.7.  He was hemodynamically stable. Blood culture collected on 7/4 grew C. albicans. Fluconazole IV was started 7/6. Further workup including TTE to r/o endocarditis was initiated; pending results. Repeated blood culture collected 7/7, pending results.    Assessment & Plan:   Principal Problem:   UTI (urinary tract infection) Active Problems:    Hyperlipidemia   Essential hypertension, benign   OSA on CPAP   Poorly controlled type 2 diabetes mellitus with peripheral neuropathy (HCC)   SIRS (systemic inflammatory response syndrome) (HCC)   Hypokalemia   Leukocytosis   Hyperglycemia due to diabetes mellitus (HCC)   Chronic kidney disease   Sepsis due to undetermined organism (HCC)   CKD (chronic kidney disease) stage 3, GFR 30-59 ml/min (HCC)   Sepsis - RESOLVED -Present on admission -Presented with leukocytosis, fever, Tachycardia - MRSA negative, Urine and blood culture 7/4 grew Candida albicans. -Today, VS significant for tachypnea RR 26, HR 70, hypertensive to 167/81, satting appropriately at 95% on RA -Secondary to urinary source; per ID -Continue IV fluids but reduce rate -SEPSIS PHYSIOLOGY RESOLVED NOW - Plan to transfer to floor today    Candidemia - Blood culture collected on 7/4 grew C. Albicans.  - Repeated blood culture collected 7/6, pending results - Possibly from urinary source, no suspicion for prostatitis given lack of pain - Diflucan IV 200mg  - IDSA recommends 14 days after documentation of blood culture clearance; patient will need PICC line  - Prostatitis is less likely - patient had prostate cancer s/p TURP (not candidate for radical prostatectomy) - Endoophthalmitis is a concern of candidemia, though patient has no visual complaint. Ophthalmic screening is not to be pursued right now.  *Literature on prevalence of intra-ocular candidiasis in patients with candidemia is variable, with prevalence reported to range from 1-16%. Studies that informed the practice of screening candidemic patients for ocular involvement came from an era where there was less antifungal therapies (Vinikoor et al 2012 https://www.sciencedirect.com/science/article/pii/S1201971213000167#:~:text=The%20Infectious%20Diseases%20Society%20of,to%20rule%20out%20intraocular%20involvement.)  -  F/u transthoracic echo 7/6 to evaluate for  endocarditis   CAUTI - catheter replaced 7/7 (straight tip 16 Fr) - d/c zosyn coverage   Urethral stricture -Follow-up at [facility] -Last dilated 03/26/2021   CKD stage IIIb -Baseline creatinine 1.3-1.6 - Stable at 1.7 today   Acute metabolic encephalopathy -Secondary to sepsis -Patient has underlying cognitive impairment -06/11/2021--much more awake and conversant - SLT evaluating patient; recommends "continue with D3/mech soft diet (plan to upgrade to regular with next diet check if Pt continues to improve) and thin liquids with NO STRAWS with small sips."  - PT evaluated patient 7/6, recommending acute SNF    Uncontrolled diabetes mellitus type 2 with hyperglycemia -NovoLog sliding scale -03/25/2021 hemoglobin A1c 10.9 - BG 210-320; today 229 -Hold metformin -Lantus increased dose to 14 units, plus added novolog 5 units TIDWC if eats 50% of meal, increase CBG monitoring to 5x/day.    Essential hypertension -Continue amlodipine   Hyperlipidemia -Continue Crestor   Hypokalemia/Hypomagnesemia - resolved -repleted   DVT prophylaxis: enoxaparin Code Status: Full  Family Communication: wife at bedside  Disposition Plan: home     Consultants:  Infectious Disease by telephone    Procedures:  F/u transthoracic echo 7/6    Antimicrobials: Diflucan IV (start 7/6)>>   DVT prophylaxis: [place] Lovenox 30 mg  Code Status: Full Family Communication: wife by bedside Disposition Plan: acute SNF   Consultants:  Infectious disease  Procedures:  F/u TTE 7/7  Antimicrobials: Diflucan IV 200mg  (start 7/6) >>    Subjective: Patient was evaluated at bedside. Seen sitting up and smiling. No acute complaint. Informed of foley changes and  possibility of acute rehab to improve strength before returning home.   Objective: Vitals:   06/11/21 0600 06/11/21 0700 06/11/21 0746 06/11/21 0900  BP: (!) 153/63 (!) 149/72    Pulse: (!) 58 66  79  Resp: (!) 26     Temp:   (!) 97.5 F  (36.4 C)   TempSrc:   Oral   SpO2: 92% 91%  95%  Weight:      Height:        Intake/Output Summary (Last 24 hours) at 06/11/2021 1051 Last data filed at 06/11/2021 0800 Gross per 24 hour  Intake 463.55 ml  Output 1825 ml  Net -1361.45 ml   Filed Weights   06/08/21 1815 06/09/21 0557 06/10/21 0544  Weight: 72.1 kg 70 kg 72.6 kg    Examination:  General exam: Appears calm and comfortable. No acute distress Respiratory system: Fine bibasilar crackles. Cardiovascular system: S1 & S2 heard, RRR. No JVD, murmurs, rubs, gallops or clicks. No pedal edema. Gastrointestinal system: Abdomen is nondistended, soft and nontender. No organomegaly or masses felt. Normal bowel sounds heard. Central nervous system: Alert and oriented. No focal neurological deficits. Extremities: Warm, well-perfused  Psychiatry: Judgement and insight appear normal. Mood & affect appropriate.   Data Reviewed: I have personally reviewed following labs and imaging studies  CBC: Recent Labs  Lab 06/08/21 1856 06/09/21 0517 06/10/21 0405 06/11/21 0308  WBC 11.1* 11.5* 13.6* 10.3  NEUTROABS 9.8*  --   --  8.5*  HGB 13.9 11.4* 12.1* 10.7*  HCT 42.5 33.9* 37.6* 32.6*  MCV 93.6 92.1 94.9 92.1  PLT 189 144* 150 595*   Basic Metabolic Panel: Recent Labs  Lab 06/08/21 1856 06/09/21 0517 06/10/21 0405 06/11/21 0308  NA 138 137 140 139  K 3.3* 3.3* 3.8 3.2*  CL 101 104 105 105  CO2 25 22 24  26  GLUCOSE 275* 245* 226* 156*  BUN 25* 25* 34* 38*  CREATININE 1.58* 1.55* 1.81* 1.73*  CALCIUM 9.3 8.5* 8.6* 8.5*  MG  --  1.6* 2.3 2.1  PHOS  --  1.6*  --   --    GFR: Estimated Creatinine Clearance: 30.9 mL/min (A) (by C-G formula based on SCr of 1.73 mg/dL (H)). Liver Function Tests: Recent Labs  Lab 06/08/21 1856 06/09/21 0517 06/11/21 0308  AST 49* 63* 43*  ALT 32 52* 42  ALKPHOS 88 73 77  BILITOT 1.1 0.7 0.5  PROT 8.4* 6.5 6.1*  ALBUMIN 3.7 2.8* 2.5*   No results for input(s): LIPASE, AMYLASE in  the last 168 hours. No results for input(s): AMMONIA in the last 168 hours. Coagulation Profile: Recent Labs  Lab 06/08/21 1856 06/09/21 0517  INR 1.0 1.2   Cardiac Enzymes: No results for input(s): CKTOTAL, CKMB, CKMBINDEX, TROPONINI in the last 168 hours. BNP (last 3 results) No results for input(s): PROBNP in the last 8760 hours. HbA1C: Recent Labs    06/08/21 1856  HGBA1C 9.0*   CBG: Recent Labs  Lab 06/10/21 0831 06/10/21 1134 06/10/21 1557 06/10/21 2158 06/11/21 0744  GLUCAP 214* 229* 303* 220* 159*   Lipid Profile: No results for input(s): CHOL, HDL, LDLCALC, TRIG, CHOLHDL, LDLDIRECT in the last 72 hours. Thyroid Function Tests: No results for input(s): TSH, T4TOTAL, FREET4, T3FREE, THYROIDAB in the last 72 hours. Anemia Panel: No results for input(s): VITAMINB12, FOLATE, FERRITIN, TIBC, IRON, RETICCTPCT in the last 72 hours. Urine analysis:    Component Value Date/Time   COLORURINE YELLOW 06/08/2021 1930   APPEARANCEUR CLOUDY (A) 06/08/2021 1930   LABSPEC 1.018 06/08/2021 1930   PHURINE 5.0 06/08/2021 1930   GLUCOSEU >=500 (A) 06/08/2021 1930   HGBUR LARGE (A) 06/08/2021 1930   BILIRUBINUR NEGATIVE 06/08/2021 1930   BILIRUBINUR + 12/22/2018 1540   KETONESUR NEGATIVE 06/08/2021 1930   PROTEINUR 100 (A) 06/08/2021 1930   UROBILINOGEN 0.2 09/11/2007 1600   NITRITE NEGATIVE 06/08/2021 1930   LEUKOCYTESUR LARGE (A) 06/08/2021 1930    Recent Results (from the past 240 hour(s))  Resp Panel by RT-PCR (Flu A&B, Covid) Nasopharyngeal Swab     Status: None   Collection Time: 06/08/21  6:22 PM   Specimen: Nasopharyngeal Swab; Nasopharyngeal(NP) swabs in vial transport medium  Result Value Ref Range Status   SARS Coronavirus 2 by RT PCR NEGATIVE NEGATIVE Final    Comment: (NOTE) SARS-CoV-2 target nucleic acids are NOT DETECTED.  The SARS-CoV-2 RNA is generally detectable in upper respiratory specimens during the acute phase of infection. The  lowest concentration of SARS-CoV-2 viral copies this assay can detect is 138 copies/mL. A negative result does not preclude SARS-Cov-2 infection and should not be used as the sole basis for treatment or other patient management decisions. A negative result may occur with  improper specimen collection/handling, submission of specimen other than nasopharyngeal swab, presence of viral mutation(s) within the areas targeted by this assay, and inadequate number of viral copies(<138 copies/mL). A negative result must be combined with clinical observations, patient history, and epidemiological information. The expected result is Negative.  Fact Sheet for Patients:  EntrepreneurPulse.com.au  Fact Sheet for Healthcare Providers:  IncredibleEmployment.be  This test is no t yet approved or cleared by the [place] FDA and  has been authorized for detection and/or diagnosis of SARS-CoV-2 by FDA under an Emergency Use Authorization (EUA). This EUA will remain  in effect (meaning this test can be used) for  the duration of the COVID-19 declaration under Section 564(b)(1) of the Act, 21 U.S.C.section 360bbb-3(b)(1), unless the authorization is terminated  or revoked sooner.       Influenza A by PCR NEGATIVE NEGATIVE Final   Influenza B by PCR NEGATIVE NEGATIVE Final    Comment: (NOTE) The Xpert Xpress SARS-CoV-2/FLU/RSV plus assay is intended as an aid in the diagnosis of influenza from Nasopharyngeal swab specimens and should not be used as a sole basis for treatment. Nasal washings and aspirates are unacceptable for Xpert Xpress SARS-CoV-2/FLU/RSV testing.  Fact Sheet for Patients: EntrepreneurPulse.com.au  Fact Sheet for Healthcare Providers: IncredibleEmployment.be  This test is not yet approved or cleared by the [place] FDA and has been authorized for detection and/or diagnosis of SARS-CoV-2 by FDA under  an Emergency Use Authorization (EUA). This EUA will remain in effect (meaning this test can be used) for the duration of the COVID-19 declaration under Section 564(b)(1) of the Act, 21 U.S.C. section 360bbb-3(b)(1), unless the authorization is terminated or revoked.  Performed at [facility], [address] [place], [place] [address]   Blood Culture (routine x 2)     Status: Abnormal (Preliminary result)   Collection Time: 06/08/21  6:40 PM   Specimen: BLOOD  Result Value Ref Range Status   Specimen Description   Final    BLOOD LEFT ANTECUBITAL Performed at [facility], [address] [place], [place] [address]    Special Requests   Final    BOTTLES DRAWN AEROBIC AND ANAEROBIC Blood Culture adequate volume Performed at [facility], [address] [place], [place] [address]    Culture  Setup Time   Final    AEROBIC BOTTLE ONLY YEAST Gram Stain Report Called to,Read Back By and Verified With: R MAYNARD,RN@[id number]  06/09/21 [facility] CRITICAL RESULT CALLED TO, READ BACK BY AND VERIFIED WITH: Shawnee Knapp RN, AT [phone] 06/10/21 D.VANHOOK    Culture (A)  Final    CANDIDA ALBICANS CULTURE REINCUBATED FOR BETTER GROWTH Performed at [facility], [place] [address] [place],  [address]    Report Status PENDING  Incomplete  Blood Culture ID Panel (Reflexed)     Status: Abnormal   Collection Time: 06/08/21  6:40 PM  Result Value Ref Range Status   Enterococcus faecalis NOT DETECTED NOT DETECTED Final   Enterococcus Faecium NOT DETECTED NOT DETECTED Final   Listeria monocytogenes NOT DETECTED NOT DETECTED Final   Staphylococcus species NOT DETECTED NOT DETECTED Final   Staphylococcus aureus (BCID) NOT DETECTED NOT DETECTED Final   Staphylococcus epidermidis NOT DETECTED NOT DETECTED Final   Staphylococcus lugdunensis NOT DETECTED NOT DETECTED Final   Streptococcus species NOT DETECTED NOT DETECTED Final   Streptococcus agalactiae NOT DETECTED NOT DETECTED Final   Streptococcus  pneumoniae NOT DETECTED NOT DETECTED Final   Streptococcus pyogenes NOT DETECTED NOT DETECTED Final   A.calcoaceticus-baumannii NOT DETECTED NOT DETECTED Final   Bacteroides fragilis NOT DETECTED NOT DETECTED Final   Enterobacterales NOT DETECTED NOT DETECTED Final   Enterobacter cloacae complex NOT DETECTED NOT DETECTED Final   Escherichia coli NOT DETECTED NOT DETECTED Final   Klebsiella aerogenes NOT DETECTED NOT DETECTED Final   Klebsiella oxytoca NOT DETECTED NOT DETECTED Final   Klebsiella pneumoniae NOT DETECTED NOT DETECTED Final   Proteus species NOT DETECTED NOT DETECTED Final   Salmonella species NOT DETECTED NOT DETECTED Final   Serratia marcescens NOT DETECTED NOT DETECTED Final   Haemophilus influenzae NOT DETECTED NOT DETECTED Final   Neisseria meningitidis NOT  DETECTED NOT DETECTED Final   Pseudomonas aeruginosa NOT DETECTED NOT DETECTED Final   Stenotrophomonas maltophilia NOT DETECTED NOT DETECTED Final   Candida albicans DETECTED (A) NOT DETECTED Final    Comment: CRITICAL RESULT CALLED TO, READ BACK BY AND VERIFIED WITH: REnriqueta Shutter RN, AT [phone] 06/10/21 D.VANHOOK    Candida auris NOT DETECTED NOT DETECTED Final   Candida glabrata NOT DETECTED NOT DETECTED Final   Candida krusei NOT DETECTED NOT DETECTED Final   Candida parapsilosis NOT DETECTED NOT DETECTED Final   Candida tropicalis NOT DETECTED NOT DETECTED Final   Cryptococcus neoformans/gattii NOT DETECTED NOT DETECTED Final    Comment: Performed at [facility] [address], [place], [place] [address]  Blood Culture (routine x 2)     Status: None (Preliminary result)   Collection Time: 06/08/21  6:57 PM   Specimen: BLOOD  Result Value Ref Range Status   Specimen Description   Final    BLOOD RIGHT ANTECUBITAL Performed at [facility], [address] [place], [place] [address]    Special Requests   Final    BOTTLES DRAWN AEROBIC AND ANAEROBIC Blood Culture adequate volume Performed at [facility], [address] [place], [place] [address]    Culture  Setup Time   Final    IN BOTH AEROBIC AND ANAEROBIC BOTTLES YEAST Gram Stain Report Called to,Read Back By and Verified With: R MAYNARD,RN@2310  06/09/21 [employer] Performed at [facility], [address] [place], [place] [address]    Culture   Final    YEAST IDENTIFICATION TO FOLLOW Performed at [facility], [place] [address], [place], [place] [address]    Report Status PENDING  Incomplete  Urine culture     Status: Abnormal   Collection Time: 06/08/21  7:30 PM   Specimen: In/Out Cath Urine  Result Value Ref Range Status   Specimen Description   Final    IN/OUT CATH URINE Performed at [facility], [address] [place], [place] [address]    Special Requests   Final    NONE Performed at [facility], [address] [place], [place] [address]    Culture >=100,000 COLONIES/mL YEAST (A)  Final   Report Status 06/10/2021 FINAL  Final  MRSA Next Gen by PCR, Nasal     Status: None   Collection Time: 06/09/21  5:51 AM   Specimen: Nasal Mucosa; Nasal Swab  Result Value Ref Range Status   MRSA by PCR Next Gen NOT DETECTED NOT DETECTED Final    Comment: (NOTE) The GeneXpert MRSA Assay (FDA approved for NASAL specimens only), is one component of a comprehensive MRSA colonization surveillance program. It is not intended to diagnose MRSA infection nor to guide or monitor treatment for MRSA infections. Test performance is not FDA approved in patients less than 75 years old. Performed at [facility], [address] [place],  [address]          Radiology Studies: DG Swallowing [facility]  Result Date: 06/10/2021 Formatting of this result is different from the original. Objective Swallowing Evaluation: Type of Study: MBS-Modified Barium Swallow Study  Patient Details Name: VARDAAN DEPASCALE MRN: [id number] Date of Birth: September 04, 1931 Today's Date: 06/10/2021 Time: SLP Start Time (ACUTE ONLY): 1149 -SLP Stop Time  (ACUTE ONLY): 1208 SLP Time Calculation (min) (ACUTE ONLY): 19 min Past Medical History: Past Medical History: Diagnosis Date  Coronary atherosclerosis of native coronary artery   BMS proximal and mid RCA as well as BMS  circumflex - 2002 (had residual 70% distal LAD), LVEF 60%  Diabetes mellitus without complication (HCC)   Essential hypertension   Hyperlipidemia   Neuropathy   NSTEMI (non-ST elevated myocardial infarction) ([place])   2002  Prostate cancer ([place])   Sleep apnea   On CPAP  Stroke [facility])  Past Surgical History: Past Surgical History: Procedure Laterality Date  COLONOSCOPY    INGUINAL HERNIA REPAIR    KNEE SURGERY    NOSE SURGERY    PROSTATE SURGERY    TONSILLECTOMY   HPI: ELDAR ROBITAILLE is a 87 y.o. male with medical history significant for prostate cancer (follows with [facility] urology), T2DM, CKD stage 3A/B, hypertension, hyperlipidemia who resented to the ED accompanied by wife due to fever and abdominal pain.  Patient was recently treated for UTI (cefpodoxime) by his urologist about a week ago, few days ago, he continued to have suprapubic pain with difficulty in urination, PCP was consulted and recommended a bladder ultrasound after which patient was asked to go to an ED for Foley catheterization due to urinary retention, this was placed at [facility] ED on 06/05/2021, this morning patient was noted to be weak and as the day progressed, he became febrile and started to have chills, patient's urologist was contacted by phone and recommended that patient should go to the ED for further evaluation and management.  Patient denies nausea, vomiting, chest pain or shortness of breath. BSE requested due to Pt clearing throat and coughing after po per RN.  Subjective: "I like Pepsi, low sugar." Assessment / Plan / Recommendation CHL IP CLINICAL IMPRESSIONS 06/10/2021 Clinical Impression Pt presents with mild sensorimotor oropharyngeal dysphagia characterized by trace to mild silent aspiration with large  presentations of thin liquids; also note slightly decreased oral coordination of bolus questionably related to decreased strength. Two visualized episodes of silent aspiration, one with large cup presentations and the second with consecutive sips via straw note premature spillage to the pyriforms resulting in trace to mild silent aspiration of thin liquids. Occasional flash penetration but no aspiration of thin liquids was noted with smaller sips and Pt benefits from the cue to hold liquids in oral cavity and swallow "hard and fast". Note mild valleculae residue across consistencies and trace pyriform residue. Pt did require additional time to thoroughly masticate solid textures; liquid wash facilitated AP transfer and swallowing trigger. Barium tablet was consumed without incident. Trials of NTL consumed without residue or penetration/aspiration. Note good epiglottic deflection and slightly decreased pharyngeal squeeze. Recommend continue with D3/mech soft diet (plan to upgrade to regular with next diet check if Pt continues to improve) and thin liquids with NO STRAWS and recommendation to take small sips. It is notable that in future hospitalizations or when Pt is deconditioned that a temporary downgrade to NTL may be indicated; this was also reviewed with Pt and wife. Reviewed recommendations and results with Pt and Pt's wife at length. Also reviewed recommendations with RN. ST will continue to follow acutely. SLP Visit Diagnosis Dysphagia, unspecified (R13.10) Attention and concentration deficit following -- Frontal lobe and executive function deficit following -- Impact on safety and function Mild aspiration risk   CHL IP TREATMENT RECOMMENDATION 06/10/2021 Treatment Recommendations Therapy as outlined in treatment plan below   Prognosis 06/10/2021 Prognosis for Safe Diet Advancement Good Barriers to Reach Goals -- Barriers/Prognosis Comment -- CHL IP DIET RECOMMENDATION 06/10/2021 SLP Diet Recommendations Dysphagia  3 (Mech soft) solids;Thin liquid Liquid Administration via Cup;Straw Medication Administration Whole meds with liquid Compensations  Slow rate;Small sips/bites;Lingual sweep for clearance of pocketing;Multiple dry swallows after each bite/sip;Follow solids with liquid Postural Changes Remain semi-upright after after feeds/meals (Comment);Seated upright at 90 degrees   CHL IP OTHER RECOMMENDATIONS 06/10/2021 Recommended Consults -- Oral Care Recommendations Oral care BID Other Recommendations Clarify dietary restrictions   CHL IP FOLLOW UP RECOMMENDATIONS 06/09/2021 Follow up Recommendations (No Data)   CHL IP FREQUENCY AND DURATION 06/10/2021 Speech Therapy Frequency (ACUTE ONLY) min 2x/week Treatment Duration 1 week      CHL IP ORAL PHASE 06/10/2021 Oral Phase WFL Oral - Pudding Teaspoon -- Oral - Pudding Cup -- Oral - Honey Teaspoon -- Oral - Honey Cup -- Oral - Nectar Teaspoon -- Oral - Nectar Cup -- Oral - Nectar Straw -- Oral - Thin Teaspoon -- Oral - Thin Cup -- Oral - Thin Straw -- Oral - Puree -- Oral - Mech Soft -- Oral - Regular -- Oral - Multi-Consistency -- Oral - Pill -- Oral Phase - Comment --  CHL IP PHARYNGEAL PHASE 06/10/2021 Pharyngeal Phase Impaired Pharyngeal- Pudding Teaspoon -- Pharyngeal -- Pharyngeal- Pudding Cup -- Pharyngeal -- Pharyngeal- Honey Teaspoon -- Pharyngeal -- Pharyngeal- Honey Cup -- Pharyngeal -- Pharyngeal- Nectar Teaspoon -- Pharyngeal -- Pharyngeal- Nectar Cup -- Pharyngeal -- Pharyngeal- Nectar Straw -- Pharyngeal -- Pharyngeal- Thin Teaspoon Pharyngeal residue - valleculae;Pharyngeal residue - pyriform;Reduced pharyngeal peristalsis;Delayed swallow initiation-pyriform sinuses;Reduced airway/laryngeal closure;Penetration/Aspiration before swallow Pharyngeal Material enters airway, remains ABOVE vocal cords then ejected out Pharyngeal- Thin Cup Pharyngeal residue - valleculae;Pharyngeal residue - pyriform;Reduced pharyngeal peristalsis;Delayed swallow initiation-pyriform  sinuses;Reduced airway/laryngeal closure;Penetration/Aspiration before swallow Pharyngeal Material enters airway, passes BELOW cords without attempt by patient to eject out (silent aspiration) Pharyngeal- Thin Straw Pharyngeal residue - valleculae;Pharyngeal residue - pyriform;Reduced pharyngeal peristalsis;Delayed swallow initiation-pyriform sinuses;Reduced airway/laryngeal closure;Penetration/Aspiration before swallow Pharyngeal Material enters airway, passes BELOW cords without attempt by patient to eject out (silent aspiration) Pharyngeal- Puree Reduced pharyngeal peristalsis;Pharyngeal residue - valleculae Pharyngeal Material does not enter airway Pharyngeal- Mechanical Soft NT Pharyngeal -- Pharyngeal- Regular Reduced pharyngeal peristalsis;Pharyngeal residue - valleculae Pharyngeal Material does not enter airway Pharyngeal- Multi-consistency NT Pharyngeal -- Pharyngeal- Pill WFL Pharyngeal Material does not enter airway Pharyngeal Comment --  CHL IP CERVICAL ESOPHAGEAL PHASE 06/10/2021 Cervical Esophageal Phase WFL Pudding Teaspoon -- Pudding Cup -- Honey Teaspoon -- Honey Cup -- Nectar Teaspoon -- Nectar Cup -- Nectar Straw -- Thin Teaspoon -- Thin Cup -- Thin Straw -- Puree -- Mechanical Soft -- Regular -- Multi-consistency -- Pill -- Cervical Esophageal Comment -- Amelia H. Roddie Mc, [place] Speech Language Pathologist Wende Bushy 06/10/2021, 4:32 PM                   Scheduled Meds:  amLODipine  2.5 mg Oral Daily   calcium-vitamin D  1 tablet Oral Q breakfast   Chlorhexidine Gluconate Cloth  6 each Topical Daily   enoxaparin (LOVENOX) injection  30 mg Subcutaneous Q24H   insulin aspart  0-5 Units Subcutaneous QHS   insulin aspart  0-9 Units Subcutaneous TID WC   insulin aspart  5 Units Subcutaneous TID WC   insulin glargine  14 Units Subcutaneous Daily   multivitamin with minerals  1 tablet Oral Daily   Continuous Infusions:  0.9 % NaCl with KCl 20 mEq / L 50 mL/hr at 06/10/21  2027   fluconazole (DIFLUCAN) IV 200 mg (06/11/21 0938)     LOS: 3 days   Time spent: 30 minutes  Fredrik Rigger, Medical Student [facility] Pager [phone]  If 7PM-7AM, please contact night-coverage [email or web address] Password [email or web address] 06/11/2021, 10:51 AM  _________________________________________________________ ATTENDING NOTE   Patient seen and examined with Fredrik Rigger, Medical student. In addition to supervising the encounter, I played a key role in the decision making process as well as reviewed key findings.   He is clinically looking much better.  Continue IV fluconazole.  Foley was replaced today 7/7.  Continue current mgmt.  PT eval.  Discuss SNF with wife today.    Murvin Natal, MD FAAFP Attending MD [facility] How to contact the [facility] Attending or Consulting provider [place] or covering provider during after hours [place], for this patient?  Check the care team in [facility] and look for a) attending/consulting TRH provider listed and b) the [facility] team listed Log into [email or web address] and use [place]'s universal password to access. If you do not have the password, please contact the hospital operator. Locate the [facility] provider you are looking for under [facility] and page to a number that you can be directly reached. If you still have difficulty reaching the provider, please page the [facility] (Director on Call) for the Hospitalists listed on amion for assistance.

## 2021-06-11 NOTE — Progress Notes (Signed)
Nurse to nurse report called and given to [place] on 300 unit. Patient being transferred to room 317. Patient and patient's Wife in room and aware.

## 2021-06-11 NOTE — Progress Notes (Signed)
I think this message came to [place] before pt was admitted to hospital. Do you still need [place] to call pt

## 2021-06-12 ENCOUNTER — Encounter (HOSPITAL_COMMUNITY): Payer: Self-pay | Admitting: Internal Medicine

## 2021-06-12 DIAGNOSIS — Z515 Encounter for palliative care: Secondary | ICD-10-CM

## 2021-06-12 DIAGNOSIS — Z7189 Other specified counseling: Secondary | ICD-10-CM

## 2021-06-12 LAB — GLUCOSE, CAPILLARY
Glucose-Capillary: 195 mg/dL — ABNORMAL HIGH (ref 70–99)
Glucose-Capillary: 218 mg/dL — ABNORMAL HIGH (ref 70–99)
Glucose-Capillary: 201 mg/dL — ABNORMAL HIGH (ref 70–99)
Glucose-Capillary: 358 mg/dL — ABNORMAL HIGH (ref 70–99)

## 2021-06-12 LAB — BASIC METABOLIC PANEL
Anion gap: 13 (ref 5–15)
BUN: 43 mg/dL — ABNORMAL HIGH (ref 8–23)
CO2: 24 mmol/L (ref 22–32)
Calcium: 8.9 mg/dL (ref 8.9–10.3)
Chloride: 105 mmol/L (ref 98–111)
Creatinine, Ser: 2.53 mg/dL — ABNORMAL HIGH (ref 0.61–1.24)
GFR, Estimated: 24 mL/min — ABNORMAL LOW (ref >60)
Glucose, Bld: 200 mg/dL — ABNORMAL HIGH (ref 70–99)
Potassium: 3.2 mmol/L — ABNORMAL LOW (ref 3.5–5.1)
Sodium: 142 mmol/L (ref 135–145)

## 2021-06-12 LAB — CBC WITH DIFFERENTIAL/PLATELET
Abs Immature Granulocytes: 0.12 10*3/uL — ABNORMAL HIGH (ref 0.00–0.07)
Basophils Absolute: 0 10*3/uL (ref 0.0–0.1)
Basophils Relative: 0 %
Eosinophils Absolute: 0 10*3/uL (ref 0.0–0.5)
Eosinophils Relative: 0 %
HCT: 38 % — ABNORMAL LOW (ref 39.0–52.0)
Hemoglobin: 12.5 g/dL — ABNORMAL LOW (ref 13.0–17.0)
Immature Granulocytes: 1 %
Lymphocytes Relative: 6 %
Lymphs Abs: 0.9 10*3/uL (ref 0.7–4.0)
MCH: 30.5 pg (ref 26.0–34.0)
MCHC: 32.9 g/dL (ref 30.0–36.0)
MCV: 92.7 fL (ref 80.0–100.0)
Monocytes Absolute: 0.9 10*3/uL (ref 0.1–1.0)
Monocytes Relative: 6 %
Neutro Abs: 12.4 10*3/uL — ABNORMAL HIGH (ref 1.7–7.7)
Neutrophils Relative %: 87 %
Platelets: 173 10*3/uL (ref 150–400)
RBC: 4.1 MIL/uL — ABNORMAL LOW (ref 4.22–5.81)
RDW: 12.8 % (ref 11.5–15.5)
WBC: 14.2 10*3/uL — ABNORMAL HIGH (ref 4.0–10.5)
nRBC: 0 % (ref 0.0–0.2)

## 2021-06-12 LAB — MAGNESIUM: Magnesium: 2.1 mg/dL (ref 1.7–2.4)

## 2021-06-12 MED ORDER — ROSUVASTATIN CALCIUM 10 MG PO TABS
10.0000 mg | ORAL_TABLET | Freq: Every evening | ORAL | Status: DC
  Administered 2021-06-12: 10 mg via ORAL
  Filled 2021-06-12: qty 1

## 2021-06-12 MED ORDER — PREGABALIN 50 MG PO CAPS: 50.0000 mg | ORAL_CAPSULE | Freq: Every day | ORAL | Status: DC

## 2021-06-12 MED ORDER — POTASSIUM CHLORIDE CRYS ER 20 MEQ PO TBCR
50.0000 meq | EXTENDED_RELEASE_TABLET | Freq: Once | ORAL | Status: AC
  Administered 2021-06-12: 50 meq via ORAL
  Filled 2021-06-12: qty 1

## 2021-06-12 MED ORDER — PREGABALIN 75 MG PO CAPS: 200.0000 mg | ORAL_CAPSULE | Freq: Every day | ORAL | Status: DC

## 2021-06-12 MED ORDER — METOPROLOL TARTRATE 25 MG PO TABS
12.5000 mg | ORAL_TABLET | Freq: Two times a day (BID) | ORAL | Status: DC
  Administered 2021-06-12 – 2021-06-13 (×3): 12.5 mg via ORAL
  Filled 2021-06-12 (×3): qty 1

## 2021-06-12 MED ORDER — FLUCONAZOLE 100 MG PO TABS
200.0000 mg | ORAL_TABLET | Freq: Every day | ORAL | Status: DC
  Administered 2021-06-13: 200 mg via ORAL
  Filled 2021-06-12: qty 2

## 2021-06-12 MED ORDER — PREGABALIN 50 MG PO CAPS: 100.0000 mg | ORAL_CAPSULE | Freq: Every day | ORAL | Status: DC

## 2021-06-12 MED ORDER — HYDRALAZINE HCL 20 MG/ML IJ SOLN: 10.0000 mg | INTRAMUSCULAR | Status: DC | PRN

## 2021-06-12 MED ORDER — PREGABALIN 50 MG PO CAPS
100.0000 mg | ORAL_CAPSULE | Freq: Every day | ORAL | Status: DC
  Administered 2021-06-12: 100 mg via ORAL
  Filled 2021-06-12 (×2): qty 2

## 2021-06-12 NOTE — Care Management Important Message (Signed)
Important Message  Patient Details  Name: CAMILLE THAU MRN: [id number] Date of Birth: 1931/01/12   Medicare Important Message Given:  Yes     Tommy Medal 06/12/2021, 3:43 PM

## 2021-06-12 NOTE — Consult Note (Signed)
Consultation Note Date: 06/12/2021   Patient Name: Luke Solis  DOB: 1931/05/03  MRN: [id number]  Age / Sex: 87 y.o., male  PCP: Kathyrn Drown, MD Referring Physician: Murlean Iba, MD  Reason for Consultation: Establishing goals of care  HPI/Patient Profile: 87 y.o. male  with past medical history of prostate cancer follows with [facility] for oncology and urology, DM2, CKD 3, HTN/HLD, CAD, NSTEMI 2002, sleep apnea with CPAP admitted on 06/08/2021 with UTI sepsis resolved.   Clinical Assessment and Goals of Care: I have reviewed medical records including EPIC notes, labs and imaging, received report from RN, assessed the patient.  Luke Solis is lying quietly in bed with student nurse at bedside.  He greets me making and mostly keeping eye contact.  He is alert and oriented, able to make his basic needs known.  There is no family at bedside at this time.  We talked about the treatment plan including, but not limited to, home with home health, IV antibiotic use.  We briefly talked about TEE for length of time for IV antibiotics.  At this point, Luke Solis tells me that he would prefer to not have invasive treatment and just take a few more weeks of IV antibiotic.  Call to daughter, Luke Solis to discuss diagnosis prognosis, GOC, EOL wishes, disposition and options.   I introduced Palliative Medicine as specialized medical care for people living with serious illness. It focuses on providing relief from the symptoms and stress of a serious illness. The goal is to improve quality of life for both the patient and the family.  We talked about Luke Solis's acute on chronic health concerns.  Daughter shares that Luke Solis has had prostate cancer in the past, but is not currently being treated.  He does see a urologist at [facility].  She shares that she was very concerned when it was noted he was unable to pass  urine on Friday.   The natural disease trajectory and expectations at EOL were discussed.  I attempted to elicit values and goals of care important to the patient.  Daughter shares that although he is very weak, and would benefit from physical therapy, family is apprehensive about discharge to short-term rehab.  She shares that she is worried about worsening mental declines.  We talked about illness and injury seen in those who have memory loss who are placed in unfamiliar surroundings.  The difference between aggressive medical intervention and comfort care was considered in light of the patient's goals of care.  It is clear that daughter, Luke Solis, is keeping Luke Solis at the center of decision-making.  She sees him and his chronic illness burden clearly.  She shares that she was concerned that he was passing last Friday, and sees this extra time as a gift.  Advanced directives, concepts specific to code status,  were considered and discussed.  Daughter shares that both Mr. and Luke Solis have advanced directives/living will.  She states that she feels certain that they would  not want life support for Luke Solis, but asks for time for family discussion.  I share that outpatient palliative can continue these discussions.  Hospice and Palliative Care services outpatient were explained and offered.  We talk in detail about hospice and palliative care services, what is and is not provided.  We talked about prognosis with permission.  I share that only Luke Solis knows, but it would not be surprising if Luke Solis has 6 months to a year or less.  I shared that his body will let us know when things are changing for him.  Provider choice offered, family chooses [facility] [place].  Discussed the importance of continued conversation with family and the medical providers regarding overall plan of care and treatment options, ensuring decisions are within the context of the patient's values and GOCs.  Questions and  concerns were addressed.  The family was encouraged to call with questions or concerns.  PMT will continue to support holistically.  Conference with attending, bedside nursing staff, transition of care team related to patient condition, needs, goals of care, palliative services.   HCPOA   NEXT OF KIN -wife, Luke Solis, and daughter, Luke Solis.  Luke Solis has other children who participate in support and decision-making  SUMMARY OF RECOMMENDATIONS   At this point continue to treat the treatable Home health services Agreeable to outpatient palliative services with [facility] [place] discussions  Code Status/Advance Care Planning: Full code -daughter states that she feels that Luke Solis would not want life support.  She asked for time for family discussion.  Symptom Management:  Per hospitalist, no additional needs at this time.  Palliative Prophylaxis:  Oral Care and Turn Reposition  Additional Recommendations (Limitations, Scope, Preferences): Continue to treat the treatable, family to hold CODE STATUS discussions  Psycho-social/Spiritual:  Desire for further Chaplaincy support:no Additional Recommendations: Caregiving  Support/Resources and Education on Hospice  Prognosis:  Unable to determine, based on outcomes.  6 months or less would not be surprising based on current functional status, frailty, chronic disease burden.  Discharge Planning:  Home with the benefits of home health, IV antibiotic use.  Outpatient palliative to follow.       Primary Diagnoses: Present on Admission:  UTI (urinary tract infection)  Essential hypertension, benign  Hyperlipidemia  Poorly controlled type 2 diabetes mellitus with peripheral neuropathy ([place])   I have reviewed the medical record, interviewed the patient and family, and examined the patient. The following aspects are pertinent.  Past Medical History:  Diagnosis Date   Coronary  atherosclerosis of native coronary artery    BMS proximal and mid RCA as well as BMS circumflex - 2002 (had residual 70% distal LAD), LVEF 60%   Diabetes mellitus without complication (HCC)    Essential hypertension    Hyperlipidemia    Neuropathy    NSTEMI (non-ST elevated myocardial infarction) ([place])    2002   Prostate cancer ([place])    Sleep apnea    On CPAP   Stroke [facility])    Social History   Socioeconomic History   Marital status: Married    Spouse name: Not on file   Number of children: Not on file   Years of education: Not on file   Highest education level: Not on file  Occupational History   Not on file  Tobacco Use   Smoking status: Never   Smokeless tobacco: Never  Vaping Use   Vaping Use: Never used  Substance and Sexual  Activity   Alcohol use: No    Alcohol/week: 0.0 standard drinks   Drug use: No   Sexual activity: Not on file  Other Topics Concern   Not on file  Social History Narrative   [facility] (08/03/17):   Originally from [place]. Has always lived in [place]. 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 No pets currently. No bird or mold exposure.    Social Determinants of Health   Financial Resource Strain: Not on file  Food Insecurity: Not on file  Transportation Needs: Not on file  Physical Activity: Not on file  Stress: Not on file  Social Connections: Not on file   Family History  Problem Relation Age of Onset   Heart attack Mother    Diabetes Mother    Coronary artery disease Father    Hypertension Father    Heart attack Father    Stroke Maternal Grandfather    Lung disease Neg Hx    Cancer Neg Hx    Scheduled Meds:  acetaminophen  650 mg Oral Q6H   amLODipine  5 mg Oral Daily   calcium-vitamin D  1 tablet Oral Q breakfast   Chlorhexidine Gluconate Cloth  6 each Topical Daily   enoxaparin (LOVENOX) injection  30 mg Subcutaneous Q24H   insulin aspart  0-5 Units Subcutaneous QHS   insulin aspart  0-9 Units  Subcutaneous TID WC   insulin aspart  5 Units Subcutaneous TID WC   insulin glargine  14 Units Subcutaneous Daily   metoprolol tartrate  12.5 mg Oral BID   multivitamin with minerals  1 tablet Oral Daily   pregabalin  100 mg Oral Daily   pregabalin  100 mg Oral QHS   rosuvastatin  10 mg Oral QPM   Continuous Infusions:  0.9 % NaCl with KCl 20 mEq / L 55 mL/hr at 06/12/21 0920   fluconazole (DIFLUCAN) IV 200 mg (06/12/21 0932)   PRN Meds:.fentaNYL (SUBLIMAZE) injection, hydrALAZINE, ondansetron (ZOFRAN) IV Medications Prior to Admission:  Prior to Admission medications   Medication Sig Start Date End Date Taking? Authorizing Provider  amLODipine (NORVASC) 2.5 MG tablet TAKE 1 TABLET ONCE DAILY. Patient taking differently: Take 2.5 mg by mouth daily. 05/19/21  Yes Kathyrn Drown, MD  aspirin 325 MG EC tablet Take 325 mg by mouth daily.   Yes [provider]  Calcium Carbonate-Vitamin D 600-400 MG-UNIT per tablet Take 1 tablet by mouth daily.   Yes [provider]  cefpodoxime (VANTIN) 200 MG tablet Take 200 mg by mouth daily. 06/06/21  Yes [provider]  fexofenadine (ALLEGRA) 180 MG tablet TAKE 1 TABLET BY MOUTH ONCE DAILY AS NEEDED FOR ALLERGIES. Patient taking differently: Take 180 mg by mouth daily as needed for allergies. 05/07/19  Yes [name], Scott A, MD  insulin glargine (LANTUS SOLOSTAR) 100 UNIT/ML Solostar Pen INJECT 12 UNITS EVERY EVENING. MAY TITRATE UP TO 24 UNITS. Patient taking differently: Inject 16 Units into the skin at bedtime. MAY TITRATE UP TO 24 UNITS. 06/05/21  Yes [name], Scott A, MD  ketoconazole (NIZORAL) 2 % cream APPLY THIN AMOUNT TWICE DAILY TO INFLAMMED AREA OF GENITALS. Patient taking differently: Apply 1 application topically as needed for irritation. 02/27/21  Yes Kathyrn Drown, MD  metFORMIN (GLUCOPHAGE) 500 MG tablet TAKE 1 TABLET ONCE DAILY. Patient taking differently: Take 500 mg by mouth daily with breakfast. 05/20/21  Yes  [name], [name], MD  Multiple Vitamin (MULTIVITAMIN) tablet Take 1 tablet by mouth  daily.   Yes [provider]  naproxen (NAPROSYN) 500 MG tablet Take 1 tablet (500 mg total) by mouth 2 (two) times daily. Patient taking differently: Take 500 mg by mouth 2 (two) times daily as needed for mild pain. 02/02/21  Yes Henderly, Britni A, PA-C  pregabalin (LYRICA) 100 MG capsule 1 in the morning 2 in the evening Patient taking differently: Take 100-200 mg by mouth 2 (two) times daily. 1 in the morning 2 in the evening 05/08/21  Yes [name], Scott A, MD  rosuvastatin (CRESTOR) 40 MG tablet TAKE 1 TABLET ONCE DAILY. Patient taking differently: Take 40 mg by mouth daily. 11/25/20  Yes Kathyrn Drown, MD  FORACARE PREMIUM V10 TEST test strip USE AS DIRECTED. 07/20/17   Mikey Kirschner, MD  GLOBAL EASE INJECT PEN NEEDLES 32G X 4 MM MISC USE TO INJECT LANTUS EVERY EVENING. 07/22/20   Kathyrn Drown, MD  nitroGLYCERIN (NITROSTAT) 0.4 MG SL tablet PLACE ONE (1) TABLET UNDER TONGUE EVERY 5 MINUTES UP TO (3) DOSES AS NEEDED FOR CHEST PAIN. Patient taking differently: Place 0.4 mg under the tongue every 5 (five) minutes as needed for chest pain. 07/12/19   Kathyrn Drown, MD   Allergies  Allergen Reactions   Broccoli Marta Lamas Oleracea] Nausea And Vomiting   Celery Oil Nausea And Vomiting   Review of Systems  Unable to perform ROS: Age   Physical Exam Vitals and nursing note reviewed.  Constitutional:      General: He is not in acute distress. HENT:     Head: Normocephalic and atraumatic.     Mouth/Throat:     Mouth: Mucous membranes are moist.  Cardiovascular:     Rate and Rhythm: Normal rate.  Pulmonary:     Effort: Pulmonary effort is normal. No respiratory distress.  Skin:    General: Skin is warm and dry.  Neurological:     Mental Status: He is alert and oriented to person, place, and time.  Psychiatric:        Mood and Affect: Mood normal.        Behavior: Behavior normal.     Vital Signs: BP (!) 149/76 (BP Location: Right Arm)   Pulse 79   Temp 98.1 F (36.7 C) (Oral)   Resp 16   Ht 6\' 2"  (1.88 m)   Wt 72.1 kg   SpO2 97%   BMI 20.41 kg/m  Pain Scale: 0-10 POSS *See Group Information*: 1-Acceptable,Awake and alert Pain Score: 0-No pain   SpO2: SpO2: 97 % O2 Device:SpO2: 97 % O2 Flow Rate: .O2 Flow Rate (L/min): 2 L/min  IO: Intake/output summary:  Intake/Output Summary (Last 24 hours) at 06/12/2021 1258 Last data filed at 06/12/2021 0946 Gross per 24 hour  Intake 1744.68 ml  Output 1550 ml  Net 194.68 ml    LBM: Last BM Date: 06/11/21 Baseline Weight: Weight: 72.1 kg Most recent weight: Weight: 72.1 kg     Palliative Assessment/Data:   Flowsheet Rows    Flowsheet Row Most Recent Value  Intake Tab   Referral Department Hospitalist  Unit at Time of Referral Med/Surg Unit  Palliative Care Primary Diagnosis Other (Comment)  Date Notified 06/12/21  Palliative Care Type New Palliative care  Reason for referral Clarify Goals of Care  Date of Admission 06/08/21  Date first seen by Palliative Care 06/12/21  # of days Palliative referral response time 0 Day(s)  # of days IP prior to Palliative referral 4  Clinical Assessment  Palliative Performance Scale Score 30%  Pain Max last 24 hours Not able to report  Pain Min Last 24 hours Not able to report  Dyspnea Max Last 24 Hours Not able to report  Dyspnea Min Last 24 hours Not able to report  Psychosocial & Spiritual Assessment   Palliative Care Outcomes        Time In: 1000 Time Out: 1110 Time Total: 70 minutes  Greater than 50%  of this time was spent counseling and coordinating care related to the above assessment and plan.  Signed by: Drue Novel, NP   Please contact Palliative Medicine Team phone at [phone] for questions and concerns.  For individual provider: See Shea Evans

## 2021-06-12 NOTE — Progress Notes (Signed)
    Cardiology was initially consulted by Dr. Wynetta Emery for consideration of a TEE on this patient. Dr. Wynetta Emery did make me aware the family did not wish to pursue this. Will remove the consult order.  Please reach out to Cardiology if needed during this admission.  Signed, Erma Heritage, PA-C 06/12/2021, 1:33 PM

## 2021-06-12 NOTE — Progress Notes (Signed)
Shift Note: Patients family has been in the room all day today with patient, alternating between wife and daughter. They have assisted him with his meals, encouraging him to eat and drink. He is alert and mostly oriented, recalling past details easily with some disorientation to current situation. He has had stable vitals. He has tolerated meds well and had a good urine output via his catheter. Patient was assisted out of bed to chair, and sat for approx 2 hours before being assisted back to bed with the assist of 2 staff members using gait belt.

## 2021-06-12 NOTE — Progress Notes (Signed)
Patient underwent TTE 7/7 for evaluation of endocarditis in setting of candidemia, which was negative for vegetation. We informed Dr. Carlyle Basques (ID), who recommended TEE for further evaluation. Had long discussion with family members including wife and first daughter regarding the risks and benefits of obtaining TEE in setting of patient's older age and declining health condition. Cardiology consult was placed for possible TEE. Family decided against TEE, and cardiology was informed and signed off.   ID was consulted regarding family's decision and definitive treatment for candidemia as planning for discharge. Per ID recs, patient will transition from IV to PO fluconazole 200mg  for 14 days with the first dose starting on 7/7 (first day repeated blood culture returned with no growth).  Of note, palliative care NP Quinn Axe had a long discussion with daughter over phone regarding goals of care. Please refer to 7/8 notes for full details.   Fredrik Rigger, Medical Student [facility] [phone] (7AM-7PM)

## 2021-06-12 NOTE — Plan of Care (Signed)
  Problem: Education: Goal: Knowledge of General Education information will improve Description Including pain rating scale, medication(s)/side effects and non-pharmacologic comfort measures Outcome: Progressing   Problem: Health Behavior/Discharge Planning: Goal: Ability to manage health-related needs will improve Outcome: Progressing   

## 2021-06-12 NOTE — TOC Progression Note (Signed)
Transition of Care [facility]) - Progression Note    Patient Details  Name: Luke Solis MRN: [id number] Date of Birth: 1931/09/17  Transition of [facility]) CM/SW Deputy, LCSW Phone Number: 06/12/2021, 5:33 PM  Clinical Narrative:    Patient's wife reported that she would like for patient to return home with [facility] and OP palliative. Patient's family also requested hospital bed. CSW referred patient to [place] and [facility] for Hospital bed. TOC to follow.   Expected Discharge Plan: [place] Barriers to Discharge: Continued Medical Work up  Expected Discharge Plan and Services Expected Discharge Plan: [place] In-house Referral: Clinical Social Work   Post Acute Care Choice: Durable Medical Equipment, [place] arrangements for the past 2 months: Single Family Home Expected Discharge Date: 06/12/21               DME Arranged: 3-N-1, Gilford Rile rolling DME Agency: Other - Comment ([facility]) Date DME Agency Contacted: 06/11/21 Time DME Agency Contacted: [phone] Representative spoke with at DME Agency: [facility] HH Arranged: RN, PT [place] Agency: [place] Date [place]: 06/11/21 Time [place]: 1508 Representative spoke with at [place]: [place] ([place]) Interventions    Readmission Risk Interventions No flowsheet data found.

## 2021-06-12 NOTE — Progress Notes (Signed)
Physical Therapy Treatment Patient Details Name: Luke Solis MRN: [id number] DOB: 1931/01/10 Today's Date: 06/12/2021    History of Present Illness Luke Solis is a 87 y.o. male with medical history significant for prostate cancer (follows with [facility] urology), T2DM, CKD stage 3A/B, hypertension, hyperlipidemia who resented to the ED accompanied by wife due to fever and abdominal pain.  Patient was recently treated for UTI (cefpodoxime) by his urologist about a week ago, few days ago, he continued to have suprapubic pain with difficulty in urination, PCP was consulted and recommended a bladder ultrasound after which patient was asked to go to an ED for Foley catheterization due to urinary retention, this was placed at [facility] ED on 06/05/2021, this morning patient was noted to be weak and as the day progressed, he became febrile and started to have chills, patient's urologist was contacted by phone and recommended that patient should go to the ED for further evaluation and management.  Patient denies nausea, vomiting, chest pain or shortness of breath.    PT Comments    Patient agreeable for therapy and his family members present (spouse and daughter) for family training in functional mobility and gait.   Patient demonstrates slow labored movement for sitting up at bedside requiring Min/mod assist and frequent verbal/tactile cueing for proper use of BUE during supine to sitting, increased time/labored movement to complete sit to stands and transfers with Min/mod assist, increased endurance distance for gait training, but frequent scissoring of legs due to narrow base of support and limited for ambulation mostly due to fatigue.  Patient's daughter demonstrates fair/good return for assisting patient during bed mobility and transfers requiring occasional verbal cueing and demonstration with understanding acknowledged.  Patient's family informed to focus on bed mobility and transfers and wait for  HHPT to train in gait training when patient is stronger before attempting themselves.  Patient tolerated sitting up in chair after therapy with family members present.  Patient will benefit from continued physical therapy in hospital and recommended venue below to increase strength, balance, endurance for safe ADLs and gait.     Follow Up Recommendations  SNF;Supervision - Intermittent;Supervision/Assistance - 24 hour     Equipment Recommendations  Rolling walker with 5" wheels    Recommendations for Other Services       Precautions / Restrictions Precautions Precautions: Fall Restrictions Weight Bearing Restrictions: No    Mobility  Bed Mobility Overal bed mobility: Needs Assistance Bed Mobility: Supine to Sit;Sit to Supine     Supine to sit: Min assist;Mod assist Sit to supine: Min assist;Mod assist        Transfers Overall transfer level: Needs assistance Equipment used: Rolling walker (2 wheeled) Transfers: Sit to/from [facility] Sit to Stand: Min assist Stand pivot transfers: Min assist       General transfer comment: increased time, labored movement, verbal cues for proper hand placement  Ambulation/Gait Ambulation/Gait assistance: Mod assist Gait Distance (Feet): 22 Feet Assistive device: Rolling walker (2 wheeled) Gait Pattern/deviations: Decreased step length - left;Decreased stance time - right;Decreased stride length;Scissoring;Narrow base of support Gait velocity: decreased   General Gait Details: slow labored cadence with frequent scissoring of legs due to narrow base of support, most difficulty making turns, limited mostly due to c/o fatigue   Stairs             Wheelchair Mobility    Modified Rankin (Stroke Patients Only)       Balance Overall balance assessment: Needs assistance  Sitting-balance support: Feet supported;No upper extremity supported Sitting balance-Leahy Scale: Fair Sitting balance - Comments:  fair/good seated at EOB   Standing balance support: During functional activity;Bilateral upper extremity supported Standing balance-Leahy Scale: Poor Standing balance comment: fair/poor using RW                            Cognition Arousal/Alertness: Awake/alert Behavior During Therapy: WFL for tasks assessed/performed Overall Cognitive Status: Within Functional Limits for tasks assessed                                        Exercises      General Comments        Pertinent Vitals/Pain Pain Assessment: No/denies pain    Home Living                      Prior Function            PT Goals (current goals can now be found in the care plan section) Acute Rehab PT Goals Patient Stated Goal: return home with family to assist PT Goal Formulation: With patient/family Time For Goal Achievement: 06/24/21 Potential to Achieve Goals: Good Progress towards PT goals: Progressing toward goals    Frequency    Min 3X/week      PT Plan Current plan remains appropriate    Co-evaluation              AM-PAC PT "6 Clicks" Mobility   Outcome Measure  Help needed turning from your back to your side while in a flat bed without using bedrails?: A Little Help needed moving from lying on your back to sitting on the side of a flat bed without using bedrails?: A Little Help needed moving to and from a bed to a chair (including a wheelchair)?: A Lot Help needed standing up from a chair using your arms (e.g., wheelchair or bedside chair)?: A Lot Help needed to walk in hospital room?: A Lot Help needed climbing 3-5 steps with a railing? : A Lot 6 Click Score: 14    End of Session   Activity Tolerance: Patient tolerated treatment well;Patient limited by fatigue Patient left: in chair;with call bell/phone within reach Nurse Communication: Mobility status PT Visit Diagnosis: Unsteadiness on feet (R26.81);Other abnormalities of gait and mobility  (R26.89);Difficulty in walking, not elsewhere classified (R26.2)     Time: 4166-0630 PT Time Calculation (min) (ACUTE ONLY): 35 min  Charges:  $Therapeutic Exercise: 8-22 mins $Therapeutic Activity: 8-22 mins                     1:59 PM, 06/12/21 Luke Solis, MPT Physical Therapist with [facility] [phone] office [phone] mobile phone

## 2021-06-12 NOTE — Plan of Care (Signed)

## 2021-06-12 NOTE — Progress Notes (Signed)
PROGRESS NOTE    SAVERIO KADER  LTJ:[id number] DOB: 12-Aug-1931 DOA: 06/08/2021 PCP: Kathyrn Drown, MD   Brief History:   87 year old male with a history of prostate cancer, urethral stricture, diabetes mellitus type 2, CKD stage III, hypertension, hyperlipidemia, OSA, stroke, coronary artery disease, cognitive impairment presenting with 1 to 2-day history of fever, abdominal pain, and confusion.  The patient is a poor historian secondary to his cognitive impairment.  History is obtained from review of the medical record and speaking with the patient's spouse.  She states that the patient had been on antibiotics (Bactrim) for about 3 weeks up until the end of June.  At the end of June, the patient began having some suprapubic pain and dysuria.  The patient contacted his PCP.  An outpatient ultrasound revealed a postvoid residual with a 479 cc.  He went to [facility] ED on 06/05/2021 where he had a postvoid residual of 650 cc.  A Foley catheter was placed.  The patient was sent home with cefpodoxime.  Urine culture during that ED visit grew 10,000-50,000 colonies of Candida albicans.  Patient spouse states that the patient began developing fevers, chills, nausea and continued suprapubic pain.  The patient had worsening confusion and generalized weakness.  As result, the patient was brought to emergency department for further evaluation.  Notably, the patient has had a chronic history of fecal incontinence.  In the emergency department, the patient had fever up to 102.9 F with tachycardia.  WBC increased to 11.5 and have downtrended to normal value (10.3) on 7.7.  He was hemodynamically stable. Blood culture collected on 7/4 grew C. albicans. Fluconazole IV was started 7/6. Further workup including TTE to r/o endocarditis showed no evidence of vegetation. Repeated blood culture collected 7/7, with NGTD. ID consult 7/8 with Dr. Baxter Flattery recommends obtaining a TEE for further evaluation of endocarditis as it would  change management to include antibiotics. Palliative medicine was consulted and will discuss goals of care with patient and family.     Assessment & Plan:   Principal Problem:   UTI (urinary tract infection) Active Problems:   Hyperlipidemia   Essential hypertension, benign   OSA on CPAP   Poorly controlled type 2 diabetes mellitus with peripheral neuropathy (HCC)   SIRS (systemic inflammatory response syndrome) (HCC)   Hypokalemia   Leukocytosis   Hyperglycemia due to diabetes mellitus (HCC)   Chronic kidney disease   Sepsis due to undetermined organism (HCC)   CKD (chronic kidney disease) stage 3, GFR 30-59 ml/min (HCC)  Sepsis - RESOLVED -Present on admission -Presented with leukocytosis, fever, Tachycardia - MRSA negative, Urine and blood culture 7/4 grew Candida albicans. -Today, VS significant for tachypnea RR 26, HR 70, hypertensive to 167/81, satting appropriately at 95% on RA -Secondary to urinary source; per ID -Continue IV fluids but reduce rate -SEPSIS PHYSIOLOGY RESOLVED NOW - Transferred to floor 7/7   Candidemia - Blood culture collected on 7/4 grew C. Albicans. - WBC up to 14 today (normalized at 10 yesterday) - Repeated blood culture collected 7/6, NGTD - Possibly from urinary source, no suspicion for prostatitis given lack of pain - Diflucan IV 200mg  - IDSA recommends 14 days after documentation of blood culture clearance; patient will need PICC line  - Prostatitis is less likely - patient had prostate cancer s/p TURP (not candidate for radical prostatectomy) - Endoophthalmitis is a concern of candidemia, though patient has no visual complaint. Ophthalmic screening is not to be pursued right now. - TTE  7/6 negative for vegetation, showed LVEF 70-75% - ID consulted, recommends TEE to further evaluate for endocarditis.   - discuss with family: if they want TEE earliest time would be Monday 7/11 (this will be part of palliative discussion)  CAUTI  - candida  >100k urine culture - catheter replaced 7/7 (straight tip 16 Fr) - patient experienced much discomfort, pain and hematuria. Urine color improved and patient is no longer complaining of pain today.  - d/c zosyn    Urethral stricture -Follow-up at [facility] -Last dilated 03/26/2021   CKD stage IIIb -Baseline creatinine 1.3-1.6 - Stable at 1.7 today   Acute metabolic encephalopathy -Secondary to sepsis -Patient has underlying cognitive impairment -06/12/2021--neurologically back to baseline with pleasant affect - SLT evaluating patient; recommends regular diet. Signed off at this time. - PT evaluated patient 7/6, recommending acute SNF, but family would prefer home health PT and nursing aide.    Uncontrolled diabetes mellitus type 2 with hyperglycemia -NovoLog sliding scale -03/25/2021 hemoglobin A1c 10.9 - BG 210-320; today 195 -Hold metformin -Lantus increased dose to 14 units, plus added novolog 5 units TIDWC if eats 50% of meal, increase CBG monitoring to 5x/day.   CBG (last 3)  Recent Labs    06/11/21 2038 06/12/21 0745 06/12/21 1109  GLUCAP 170* 195* 218*   Essential hypertension - BP has been increasing to 170s-180s/ 80s-90s.  - Increase amlodipine to 5.0mg  (from 2.5mg ) PO qAM - Add metoprolol 12.5mg  PO BID - Hydralazine  IV 10mg  PRN q6h for SBP >160, DBP >90   Hyperlipidemia -Continue Crestor   Hypokalemia/Hypomagnesemia - resolved -repleted  DVT prophylaxis: Enoxaparin Code Status: Full - palliative consulted and will have [place] discussion with family Family Communication: Daughter at bedside updated, wife updated 7/7 at bedside, palliative discussion 7/8 w/family Disposition Plan: home with home health PT and nursing aide to help manage IV antifungal  Consultants:  Cardiology [facility]) Infectious diseases (Dr. Baxter Flattery) telephone consultations  Procedures: Plan for TEE pending Cardiology consult and family discussion with palliative  care  Antimicrobials:  Diflucan IV 200mg  qd (start 7/6 - stop plan for 7/20)  Subjective: Patient was evaluated at bedside with daughter present. Patient was smiling and states he feels much better today with less pain. Patient has been receiving tylenol q6h scheduled. No acute complaint.  Daughter was updated on patient's clinical status and treatment plan for candidemia as well as potential discussion with palliative medicine regarding goals of care. Daughter is understanding and agreeable.  Objective: Vitals:   06/11/21 2120 06/12/21 0055 06/12/21 0637 06/12/21 0928  BP: (!) 172/94 (!) 159/85 (!) 179/91 (!) 149/76  Pulse: 60 90 90 79  Resp: 18 16 16    Temp: 98.3 F (36.8 C) 98.5 F (36.9 C) 98.1 F (36.7 C)   TempSrc: Oral Oral Oral   SpO2: 94% 97% 97%   Weight:      Height:        Intake/Output Summary (Last 24 hours) at 06/12/2021 1029 Last data filed at 06/12/2021 0946 Gross per 24 hour  Intake 1744.68 ml  Output 1550 ml  Net 194.68 ml   Filed Weights   06/09/21 0557 06/10/21 0544 06/11/21 1247  Weight: 70 kg 72.6 kg 72.1 kg   Examination:  General exam: confused, chronically ill appearing male, Appears calm and comfortable, lying in bed with no acute distress Respiratory system: Clear to auscultation. Respiratory effort normal. Cardiovascular system: S1 & S2 heard. No JVD, murmurs, rubs, gallops or clicks. No pedal edema. Gastrointestinal  system: Abdomen is nondistended, soft and nontender. No organomegaly or masses felt. Normal bowel sounds heard. Central nervous system: Alert and oriented to person only. No focal neurological deficits. confused - keeps raising right hand without intentionally want to do so. Responding to questions. CN II-XII grossly intact.  He has dementia.  Extremities: Symmetric 5 x 5 power. Skin: No rashes, lesions or ulcers.   Psychiatry: Mood & affect appropriate.   Data Reviewed: I have personally reviewed following labs and imaging  studies  CBC: Recent Labs  Lab 06/08/21 1856 06/09/21 0517 06/10/21 0405 06/11/21 0308 06/12/21 0435  WBC 11.1* 11.5* 13.6* 10.3 14.2*  NEUTROABS 9.8*  --   --  8.5* 12.4*  HGB 13.9 11.4* 12.1* 10.7* 12.5*  HCT 42.5 33.9* 37.6* 32.6* 38.0*  MCV 93.6 92.1 94.9 92.1 92.7  PLT 189 144* 150 143* 932   Basic Metabolic Panel: Recent Labs  Lab 06/08/21 1856 06/09/21 0517 06/10/21 0405 06/11/21 0308 06/12/21 0435  NA 138 137 140 139 142  K 3.3* 3.3* 3.8 3.2* 3.2*  CL 101 104 105 105 105  CO2 25 22 24 26 24   GLUCOSE 275* 245* 226* 156* 200*  BUN 25* 25* 34* 38* 43*  CREATININE 1.58* 1.55* 1.81* 1.73* 2.53*  CALCIUM 9.3 8.5* 8.6* 8.5* 8.9  MG  --  1.6* 2.3 2.1 2.1  PHOS  --  1.6*  --   --   --    GFR: Estimated Creatinine Clearance: 21 mL/min (A) (by C-G formula based on SCr of 2.53 mg/dL (H)). Liver Function Tests: Recent Labs  Lab 06/08/21 1856 06/09/21 0517 06/11/21 0308  AST 49* 63* 43*  ALT 32 52* 42  ALKPHOS 88 73 77  BILITOT 1.1 0.7 0.5  PROT 8.4* 6.5 6.1*  ALBUMIN 3.7 2.8* 2.5*   No results for input(s): LIPASE, AMYLASE in the last 168 hours. No results for input(s): AMMONIA in the last 168 hours. Coagulation Profile: Recent Labs  Lab 06/08/21 1856 06/09/21 0517  INR 1.0 1.2   Cardiac Enzymes: No results for input(s): CKTOTAL, CKMB, CKMBINDEX, TROPONINI in the last 168 hours. BNP (last 3 results) No results for input(s): PROBNP in the last 8760 hours. HbA1C: No results for input(s): HGBA1C in the last 72 hours. CBG: Recent Labs  Lab 06/11/21 0744 06/11/21 1108 06/11/21 1605 06/11/21 2038 06/12/21 0745  GLUCAP 159* 146* 122* 170* 195*   Lipid Profile: No results for input(s): CHOL, HDL, LDLCALC, TRIG, CHOLHDL, LDLDIRECT in the last 72 hours. Thyroid Function Tests: No results for input(s): TSH, T4TOTAL, FREET4, T3FREE, THYROIDAB in the last 72 hours. Anemia Panel: No results for input(s): VITAMINB12, FOLATE, FERRITIN, TIBC, IRON,  RETICCTPCT in the last 72 hours. Urine analysis:    Component Value Date/Time   COLORURINE YELLOW 06/08/2021 1930   APPEARANCEUR CLOUDY (A) 06/08/2021 1930   LABSPEC 1.018 06/08/2021 1930   PHURINE 5.0 06/08/2021 1930   GLUCOSEU >=500 (A) 06/08/2021 1930   HGBUR LARGE (A) 06/08/2021 1930   BILIRUBINUR NEGATIVE 06/08/2021 1930   BILIRUBINUR + 12/22/2018 1540   KETONESUR NEGATIVE 06/08/2021 1930   PROTEINUR 100 (A) 06/08/2021 1930   UROBILINOGEN 0.2 09/11/2007 1600   NITRITE NEGATIVE 06/08/2021 1930   LEUKOCYTESUR LARGE (A) 06/08/2021 1930   Sepsis Labs: @LABRCNTIP (procalcitonin:4,lacticidven:4)  ) Recent Results (from the past 240 hour(s))  Resp Panel by RT-PCR (Flu A&B, Covid) Nasopharyngeal Swab     Status: None   Collection Time: 06/08/21  6:22 PM   Specimen: Nasopharyngeal Swab; Nasopharyngeal(NP) swabs  in vial transport medium  Result Value Ref Range Status   SARS Coronavirus 2 by RT PCR NEGATIVE NEGATIVE Final    Comment: (NOTE) SARS-CoV-2 target nucleic acids are NOT DETECTED.  The SARS-CoV-2 RNA is generally detectable in upper respiratory specimens during the acute phase of infection. The lowest concentration of SARS-CoV-2 viral copies this assay can detect is 138 copies/mL. A negative result does not preclude SARS-Cov-2 infection and should not be used as the sole basis for treatment or other patient management decisions. A negative result may occur with  improper specimen collection/handling, submission of specimen other than nasopharyngeal swab, presence of viral mutation(s) within the areas targeted by this assay, and inadequate number of viral copies(<138 copies/mL). A negative result must be combined with clinical observations, patient history, and epidemiological information. The expected result is Negative.  Fact Sheet for Patients:  EntrepreneurPulse.com.au  Fact Sheet for Healthcare Providers:   IncredibleEmployment.be  This test is no t yet approved or cleared by the [place] FDA and  has been authorized for detection and/or diagnosis of SARS-CoV-2 by FDA under an Emergency Use Authorization (EUA). This EUA will remain  in effect (meaning this test can be used) for the duration of the COVID-19 declaration under Section 564(b)(1) of the Act, 21 U.S.C.section 360bbb-3(b)(1), unless the authorization is terminated  or revoked sooner.       Influenza A by PCR NEGATIVE NEGATIVE Final   Influenza B by PCR NEGATIVE NEGATIVE Final    Comment: (NOTE) The Xpert Xpress SARS-CoV-2/FLU/RSV plus assay is intended as an aid in the diagnosis of influenza from Nasopharyngeal swab specimens and should not be used as a sole basis for treatment. Nasal washings and aspirates are unacceptable for Xpert Xpress SARS-CoV-2/FLU/RSV testing.  Fact Sheet for Patients: EntrepreneurPulse.com.au  Fact Sheet for Healthcare Providers: IncredibleEmployment.be  This test is not yet approved or cleared by the [place] FDA and has been authorized for detection and/or diagnosis of SARS-CoV-2 by FDA under an Emergency Use Authorization (EUA). This EUA will remain in effect (meaning this test can be used) for the duration of the COVID-19 declaration under Section 564(b)(1) of the Act, 21 U.S.C. section 360bbb-3(b)(1), unless the authorization is terminated or revoked.  Performed at [facility], [address], [place], [place] [address]   Blood Culture (routine x 2)     Status: Abnormal (Preliminary result)   Collection Time: 06/08/21  6:40 PM   Specimen: BLOOD  Result Value Ref Range Status   Specimen Description   Final    BLOOD LEFT ANTECUBITAL Performed at [facility], [address] [place], [place] [address]    Special Requests   Final    BOTTLES DRAWN AEROBIC AND ANAEROBIC Blood Culture adequate volume Performed at [facility], [address] [place], [place] [address]    Culture  Setup Time   Final    AEROBIC BOTTLE ONLY YEAST Gram Stain Report Called to,Read Back By and Verified With: R MAYNARD,[email or web address]  06/09/21 [facility] CRITICAL RESULT CALLED TO, READ BACK BY AND VERIFIED WITH: Shawnee Knapp RN, AT [phone] 06/10/21 D.VANHOOK    Culture (A)  Final    CANDIDA ALBICANS Sent to [facility] for further susceptibility testing. Performed at [facility], [place] [address], [place], [place] [address]    Report Status PENDING  Incomplete  Blood Culture ID Panel (Reflexed)     Status: Abnormal   Collection Time: 06/08/21  6:40 PM  Result Value Ref Range Status   Enterococcus faecalis NOT  DETECTED NOT DETECTED Final   Enterococcus Faecium NOT DETECTED NOT DETECTED Final   Listeria monocytogenes NOT DETECTED NOT DETECTED Final   Staphylococcus species NOT DETECTED NOT DETECTED Final   Staphylococcus aureus (BCID) NOT DETECTED NOT DETECTED Final   Staphylococcus epidermidis NOT DETECTED NOT DETECTED Final   Staphylococcus lugdunensis NOT DETECTED NOT DETECTED Final   Streptococcus species NOT DETECTED NOT DETECTED Final   Streptococcus agalactiae NOT DETECTED NOT DETECTED Final   Streptococcus pneumoniae NOT DETECTED NOT DETECTED Final   Streptococcus pyogenes NOT DETECTED NOT DETECTED Final   A.calcoaceticus-baumannii NOT DETECTED NOT DETECTED Final   Bacteroides fragilis NOT DETECTED NOT DETECTED Final   Enterobacterales NOT DETECTED NOT DETECTED Final   Enterobacter cloacae complex NOT DETECTED NOT DETECTED Final   Escherichia coli NOT DETECTED NOT DETECTED Final   Klebsiella aerogenes NOT DETECTED NOT DETECTED Final   Klebsiella oxytoca NOT DETECTED NOT DETECTED Final   Klebsiella pneumoniae NOT DETECTED NOT DETECTED Final   Proteus species NOT DETECTED NOT DETECTED Final   Salmonella species NOT DETECTED NOT DETECTED Final   Serratia marcescens NOT DETECTED NOT DETECTED Final   Haemophilus influenzae NOT DETECTED  NOT DETECTED Final   Neisseria meningitidis NOT DETECTED NOT DETECTED Final   Pseudomonas aeruginosa NOT DETECTED NOT DETECTED Final   Stenotrophomonas maltophilia NOT DETECTED NOT DETECTED Final   Candida albicans DETECTED (A) NOT DETECTED Final    Comment: CRITICAL RESULT CALLED TO, READ BACK BY AND VERIFIED WITH: REnriqueta Shutter RN, AT [phone] 06/10/21 D.VANHOOK    Candida auris NOT DETECTED NOT DETECTED Final   Candida glabrata NOT DETECTED NOT DETECTED Final   Candida krusei NOT DETECTED NOT DETECTED Final   Candida parapsilosis NOT DETECTED NOT DETECTED Final   Candida tropicalis NOT DETECTED NOT DETECTED Final   Cryptococcus neoformans/gattii NOT DETECTED NOT DETECTED Final    Comment: Performed at [facility], [address], [place], [place] [address]  Blood Culture (routine x 2)     Status: Abnormal (Preliminary result)   Collection Time: 06/08/21  6:57 PM   Specimen: BLOOD  Result Value Ref Range Status   Specimen Description   Final    BLOOD RIGHT ANTECUBITAL Performed at [facility], [address], [place], [place] [address]    Special Requests   Final    BOTTLES DRAWN AEROBIC AND ANAEROBIC Blood Culture adequate volume Performed at [facility], [address] [place], [place] [address]    Culture  Setup Time   Final    IN BOTH AEROBIC AND ANAEROBIC BOTTLES YEAST Gram Stain Report Called to,Read Back By and Verified With: R MAYNARD,[email or web address]@[id number]  06/09/21 [facility] Performed at [facility], [address], [place], [place] [address]    Culture CANDIDA ALBICANS (A)  Final   Report Status PENDING  Incomplete  Urine culture     Status: Abnormal   Collection Time: 06/08/21  7:30 PM   Specimen: In/Out Cath Urine  Result Value Ref Range Status   Specimen Description   Final    IN/OUT CATH URINE Performed at [facility], [address], [place], [place] [address]    Special Requests   Final    NONE Performed at [facility], [address], [place], [place] [address]     Culture >=100,000 COLONIES/mL YEAST (A)  Final   Report Status 06/10/2021 FINAL  Final  MRSA Next Gen by PCR, Nasal     Status: None   Collection Time: 06/09/21  5:51 AM   Specimen: Nasal  Mucosa; Nasal Swab  Result Value Ref Range Status   MRSA by PCR Next Gen NOT DETECTED NOT DETECTED Final    Comment: (NOTE) The GeneXpert MRSA Assay (FDA approved for NASAL specimens only), is one component of a comprehensive MRSA colonization surveillance program. It is not intended to diagnose MRSA infection nor to guide or monitor treatment for MRSA infections. Test performance is not FDA approved in patients less than 2 years old. Performed at [facility], [address] [place], [place] [address]   Culture, blood (Routine X 2) w Reflex to ID Panel     Status: None (Preliminary result)   Collection Time: 06/11/21  3:09 AM   Specimen: BLOOD RIGHT HAND  Result Value Ref Range Status   Specimen Description BLOOD RIGHT HAND  Final   Special Requests   Final    BOTTLES DRAWN AEROBIC AND ANAEROBIC Blood Culture adequate volume   Culture   Final    NO GROWTH 1 DAY Performed at [facility], [address], [place], [place] [address]    Report Status PENDING  Incomplete  Culture, blood (Routine X 2) w Reflex to ID Panel     Status: None (Preliminary result)   Collection Time: 06/11/21  3:13 AM   Specimen: BLOOD LEFT HAND  Result Value Ref Range Status   Specimen Description BLOOD LEFT HAND  Final   Special Requests   Final    BOTTLES DRAWN AEROBIC AND ANAEROBIC Blood Culture adequate volume   Culture   Final    NO GROWTH 1 DAY Performed at [facility], [address] [place], [place] [address]    Report Status PENDING  Incomplete     Radiology Studies: DG Swallowing Func-Speech Pathology  Result Date: 06/10/2021 Formatting of this result is different from the original. Objective Swallowing Evaluation: Type of Study: MBS-Modified Barium Swallow Study  Patient Details Name: PERSHING SKIDMORE MRN:  [id number] Date of Birth: Jul 14, 1931 Today's Date: 06/10/2021 Time: SLP Start Time (ACUTE ONLY): 1149 -SLP Stop Time (ACUTE ONLY): 1208 SLP Time Calculation (min) (ACUTE ONLY): 19 min Past Medical History: Past Medical History: Diagnosis Date  Coronary atherosclerosis of native coronary artery   BMS proximal and mid RCA as well as BMS circumflex - 2002 (had residual 70% distal LAD), LVEF 60%  Diabetes mellitus without complication ([place])   Essential hypertension   Hyperlipidemia   Neuropathy   NSTEMI (non-ST elevated myocardial infarction) ([place])   2002  Prostate cancer ([place])   Sleep apnea   On CPAP  Stroke [facility])  Past Surgical History: Past Surgical History: Procedure Laterality Date  COLONOSCOPY    INGUINAL HERNIA REPAIR    KNEE SURGERY    NOSE SURGERY    PROSTATE SURGERY    TONSILLECTOMY   HPI: BEECHER FURIO is a 87 y.o. male with medical history significant for prostate cancer (follows with [facility] urology), T2DM, CKD stage 3A/B, hypertension, hyperlipidemia who resented to the ED accompanied by wife due to fever and abdominal pain.  Patient was recently treated for UTI (cefpodoxime) by his urologist about a week ago, few days ago, he continued to have suprapubic pain with difficulty in urination, PCP was consulted and recommended a bladder ultrasound after which patient was asked to go to an ED for Foley catheterization due to urinary retention, this was placed at [facility] ED on 06/05/2021, this morning patient was noted to be weak and as the day progressed, he became febrile and started to have chills, patient's urologist  was contacted by phone and recommended that patient should go to the ED for further evaluation and management.  Patient denies nausea, vomiting, chest pain or shortness of breath. BSE requested due to Pt clearing throat and coughing after po per RN.  Subjective: "I like Pepsi, low sugar." Assessment / Plan / Recommendation CHL IP CLINICAL IMPRESSIONS 06/10/2021 Clinical Impression Pt presents  with mild sensorimotor oropharyngeal dysphagia characterized by trace to mild silent aspiration with large presentations of thin liquids; also note slightly decreased oral coordination of bolus questionably related to decreased strength. Two visualized episodes of silent aspiration, one with large cup presentations and the second with consecutive sips via straw note premature spillage to the pyriforms resulting in trace to mild silent aspiration of thin liquids. Occasional flash penetration but no aspiration of thin liquids was noted with smaller sips and Pt benefits from the cue to hold liquids in oral cavity and swallow "hard and fast". Note mild valleculae residue across consistencies and trace pyriform residue. Pt did require additional time to thoroughly masticate solid textures; liquid wash facilitated AP transfer and swallowing trigger. Barium tablet was consumed without incident. Trials of NTL consumed without residue or penetration/aspiration. Note good epiglottic deflection and slightly decreased pharyngeal squeeze. Recommend continue with D3/mech soft diet (plan to upgrade to regular with next diet check if Pt continues to improve) and thin liquids with NO STRAWS and recommendation to take small sips. It is notable that in future hospitalizations or when Pt is deconditioned that a temporary downgrade to NTL may be indicated; this was also reviewed with Pt and wife. Reviewed recommendations and results with Pt and Pt's wife at length. Also reviewed recommendations with RN. ST will continue to follow acutely. SLP Visit Diagnosis Dysphagia, unspecified (R13.10) Attention and concentration deficit following -- Frontal lobe and executive function deficit following -- Impact on safety and function Mild aspiration risk   CHL IP TREATMENT RECOMMENDATION 06/10/2021 Treatment Recommendations Therapy as outlined in treatment plan below   Prognosis 06/10/2021 Prognosis for Safe Diet Advancement Good Barriers to Reach  Goals -- Barriers/Prognosis Comment -- CHL IP DIET RECOMMENDATION 06/10/2021 SLP Diet Recommendations Dysphagia 3 (Mech soft) solids;Thin liquid Liquid Administration via Cup;Straw Medication Administration Whole meds with liquid Compensations Slow rate;Small sips/bites;Lingual sweep for clearance of pocketing;Multiple dry swallows after each bite/sip;Follow solids with liquid Postural Changes Remain semi-upright after after feeds/meals (Comment);Seated upright at 90 degrees   CHL IP OTHER RECOMMENDATIONS 06/10/2021 Recommended Consults -- Oral Care Recommendations Oral care BID Other Recommendations Clarify dietary restrictions   CHL IP FOLLOW UP RECOMMENDATIONS 06/09/2021 Follow up Recommendations (No Data)   CHL IP FREQUENCY AND DURATION 06/10/2021 Speech Therapy Frequency (ACUTE ONLY) min 2x/week Treatment Duration 1 week      CHL IP ORAL PHASE 06/10/2021 Oral Phase WFL Oral - Pudding Teaspoon -- Oral - Pudding Cup -- Oral - Honey Teaspoon -- Oral - Honey Cup -- Oral - Nectar Teaspoon -- Oral - Nectar Cup -- Oral - Nectar Straw -- Oral - Thin Teaspoon -- Oral - Thin Cup -- Oral - Thin Straw -- Oral - Puree -- Oral - Mech Soft -- Oral - Regular -- Oral - Multi-Consistency -- Oral - Pill -- Oral Phase - Comment --  CHL IP PHARYNGEAL PHASE 06/10/2021 Pharyngeal Phase Impaired Pharyngeal- Pudding Teaspoon -- Pharyngeal -- Pharyngeal- Pudding Cup -- Pharyngeal -- Pharyngeal- Honey Teaspoon -- Pharyngeal -- Pharyngeal- Honey Cup -- Pharyngeal -- Pharyngeal- Nectar Teaspoon -- Pharyngeal -- Pharyngeal- Nectar Cup -- Pharyngeal -- Pharyngeal- Nectar  Straw -- Pharyngeal -- Pharyngeal- Thin Teaspoon Pharyngeal residue - valleculae;Pharyngeal residue - pyriform;Reduced pharyngeal peristalsis;Delayed swallow initiation-pyriform sinuses;Reduced airway/laryngeal closure;Penetration/Aspiration before swallow Pharyngeal Material enters airway, remains ABOVE vocal cords then ejected out Pharyngeal- Thin Cup Pharyngeal residue -  valleculae;Pharyngeal residue - pyriform;Reduced pharyngeal peristalsis;Delayed swallow initiation-pyriform sinuses;Reduced airway/laryngeal closure;Penetration/Aspiration before swallow Pharyngeal Material enters airway, passes BELOW cords without attempt by patient to eject out (silent aspiration) Pharyngeal- Thin Straw Pharyngeal residue - valleculae;Pharyngeal residue - pyriform;Reduced pharyngeal peristalsis;Delayed swallow initiation-pyriform sinuses;Reduced airway/laryngeal closure;Penetration/Aspiration before swallow Pharyngeal Material enters airway, passes BELOW cords without attempt by patient to eject out (silent aspiration) Pharyngeal- Puree Reduced pharyngeal peristalsis;Pharyngeal residue - valleculae Pharyngeal Material does not enter airway Pharyngeal- Mechanical Soft NT Pharyngeal -- Pharyngeal- Regular Reduced pharyngeal peristalsis;Pharyngeal residue - valleculae Pharyngeal Material does not enter airway Pharyngeal- Multi-consistency NT Pharyngeal -- Pharyngeal- Pill WFL Pharyngeal Material does not enter airway Pharyngeal Comment --  CHL IP CERVICAL ESOPHAGEAL PHASE 06/10/2021 Cervical Esophageal Phase WFL Pudding Teaspoon -- Pudding Cup -- Honey Teaspoon -- Honey Cup -- Nectar Teaspoon -- Nectar Cup -- Nectar Straw -- Thin Teaspoon -- Thin Cup -- Thin Straw -- Puree -- Mechanical Soft -- Regular -- Multi-consistency -- Pill -- Cervical Esophageal Comment -- Amelia H. Roddie Mc, CCC-SLP Speech Language Pathologist Wende Bushy 06/10/2021, 4:32 PM              ECHOCARDIOGRAM COMPLETE BUBBLE STUDY  Result Date: 06/11/2021    ECHOCARDIOGRAM REPORT   Patient Name:   DERONTE SOLIS Date of Exam: 06/11/2021 Medical Rec #:  [id number]     Height:       74.0 in Accession #:    [id number]    Weight:       160.1 lb Date of Birth:  1931/04/03      BSA:          1.977 m Patient Age:    55 years      BP:           149/72 mmHg Patient Gender: M             HR:           66 bpm. Exam Location:  [facility]  Procedure: 2D Echo, Cardiac Doppler and Color Doppler Indications:    Candidemia ([place])  History:        Patient has prior history of Echocardiogram examinations, most                 recent 03/01/2018. CAD; Risk Factors:Hypertension and Diabetes.  Sonographer:    Wenda Low Referring Phys: [place]  1. Left ventricular ejection fraction, by estimation, is 70 to 75%. The left ventricle has hyperdynamic function. The left ventricle has no regional wall motion abnormalities. There is mild left ventricular hypertrophy. Left ventricular diastolic parameters are indeterminate.  2. Right ventricular systolic function is normal. The right ventricular size is normal.  3. The mitral valve is normal in structure. Mild mitral valve regurgitation.  4. The aortic valve is tricuspid. Aortic valve regurgitation is not visualized. Mild to moderate aortic valve sclerosis/calcification is present, without any evidence of aortic stenosis. FINDINGS  Left Ventricle: Left ventricular ejection fraction, by estimation, is 70 to 75%. The left ventricle has hyperdynamic function. The left ventricle has no regional wall motion abnormalities. The left ventricular internal cavity size was normal in size. There is mild left ventricular hypertrophy. Left ventricular diastolic parameters are indeterminate. Right Ventricle: The right ventricular size  is normal. Right vetricular wall thickness was not assessed. Right ventricular systolic function is normal. Left Atrium: Left atrial size was normal in size. Right Atrium: Right atrial size was normal in size. Pericardium: There is no evidence of pericardial effusion. Mitral Valve: The mitral valve is normal in structure. Mild mitral valve regurgitation. MV peak gradient, 4.9 mmHg. The mean mitral valve gradient is 2.0 mmHg. Tricuspid Valve: The tricuspid valve is normal in structure. Tricuspid valve regurgitation is trivial. Aortic Valve: The aortic valve is tricuspid.  Aortic valve regurgitation is not visualized. Mild to moderate aortic valve sclerosis/calcification is present, without any evidence of aortic stenosis. Aortic valve mean gradient measures 4.0 mmHg. Aortic valve peak gradient measures 8.6 mmHg. Pulmonic Valve: The pulmonic valve was grossly normal. Pulmonic valve regurgitation is not visualized. Aorta: The aortic root is normal in size and structure. IAS/Shunts: No atrial level shunt detected by color flow Doppler.  LEFT VENTRICLE PLAX 2D LVIDd:         4.25 cm Diastology LVIDs:         2.57 cm LV e' medial:    7.97 cm/s LV PW:         1.07 cm LV E/e' medial:  10.7 LV IVS:        1.35 cm LV e' lateral:   8.41 cm/s                        LV E/e' lateral: 10.1  RIGHT VENTRICLE RV Basal diam:  3.27 cm RV Mid diam:    3.28 cm RV S prime:     16.40 cm/s TAPSE (M-mode): 2.7 cm LEFT ATRIUM             Index       RIGHT ATRIUM           Index LA diam:        4.10 cm 2.07 cm/m  RA Area:     17.40 cm LA Vol (A2C):   55.1 ml 27.87 ml/m RA Volume:   43.40 ml  21.95 ml/m LA Vol (A4C):   50.9 ml 25.74 ml/m LA Biplane Vol: 56.8 ml 28.73 ml/m  AORTIC VALVE AV Vmax:           147.00 cm/s AV Vmean:          98.000 cm/s AV VTI:            0.341 m AV Peak Grad:      8.6 mmHg AV Mean Grad:      4.0 mmHg LVOT Vmax:         113.00 cm/s LVOT Vmean:        76.800 cm/s LVOT VTI:          0.269 m LVOT/AV VTI ratio: 0.79  AORTA Ao Root diam: 2.65 cm MITRAL VALVE MV Area (PHT): 3.54 cm     SHUNTS MV Peak grad:  4.9 mmHg     Systemic VTI: 0.27 m MV Mean grad:  2.0 mmHg MV Vmax:       1.11 m/s MV Vmean:      60.4 cm/s MV Decel Time: 214 msec MV E velocity: 85.30 cm/s MV A velocity: 102.00 cm/s MV E/A ratio:  0.84 Dorris Carnes MD Electronically signed by Dorris Carnes MD Signature Date/Time: 06/11/2021/4:43:14 PM    Final     Scheduled Meds:  acetaminophen  650 mg Oral Q6H   amLODipine  5 mg Oral Daily   calcium-vitamin D  1 tablet Oral Q breakfast   Chlorhexidine Gluconate Cloth  6 each  Topical Daily   enoxaparin (LOVENOX) injection  30 mg Subcutaneous Q24H   insulin aspart  0-5 Units Subcutaneous QHS   insulin aspart  0-9 Units Subcutaneous TID WC   insulin aspart  5 Units Subcutaneous TID WC   insulin glargine  14 Units Subcutaneous Daily   metoprolol tartrate  12.5 mg Oral BID   multivitamin with minerals  1 tablet Oral Daily   pregabalin  100 mg Oral Daily   pregabalin  200 mg Oral QHS   rosuvastatin  10 mg Oral QPM   Continuous Infusions:  0.9 % NaCl with KCl 20 mEq / L 55 mL/hr at 06/12/21 0920   fluconazole (DIFLUCAN) IV 200 mg (06/12/21 0932)     LOS: 4 days   Time spent: 30 minutes  Fredrik Rigger, Medical student [facility] Pager [phone]  If 7PM-7AM, please contact night-coverage [email or web address] Password [email or web address] 06/12/2021, 10:29 AM  ________________________________________________________________ ATTENDING NOTE  Patient seen and examined with Fredrik Rigger, Medical student. In addition to supervising the encounter, I played a key role in the decision making process as well as reviewed key findings.  I spoke with daughter at bedside and she was agreeable to a palliative care consultation.  She requested resources at home to care for him.  Plan is for family to care for him at home in shifts.  TEE recommended by ID.  Discussing with family during goals of care meeting if they want to proceed.  The earliest could be done would be Monday.  Discussed with Dr. Harrington Challenger (cardiology) and T. Dove (palliative).   Murvin Natal MD Attending physician [facility] How to contact the [facility] Attending or Consulting provider [place] or covering provider during after hours [place], for this patient?  Check the care team in [facility] and look for a) attending/consulting [facility] provider listed and b) the [facility] team listed Log into [email or web address] and use [place]'s universal password to access. If you do not have the password, please contact the hospital operator. Locate the [facility]  provider you are looking for under [facility] and page to a number that you can be directly reached. If you still have difficulty reaching the provider, please page the [facility] (Director on Call) for the Hospitalists listed on amion for assistance.

## 2021-06-13 LAB — CBC WITH DIFFERENTIAL/PLATELET
Abs Immature Granulocytes: 0.12 10*3/uL — ABNORMAL HIGH (ref 0.00–0.07)
Basophils Absolute: 0 10*3/uL (ref 0.0–0.1)
Basophils Relative: 0 %
Eosinophils Absolute: 0 10*3/uL (ref 0.0–0.5)
Eosinophils Relative: 0 %
HCT: 34.9 % — ABNORMAL LOW (ref 39.0–52.0)
Hemoglobin: 11.6 g/dL — ABNORMAL LOW (ref 13.0–17.0)
Immature Granulocytes: 1 %
Lymphocytes Relative: 9 %
Lymphs Abs: 1.2 10*3/uL (ref 0.7–4.0)
MCH: 30.8 pg (ref 26.0–34.0)
MCHC: 33.2 g/dL (ref 30.0–36.0)
MCV: 92.6 fL (ref 80.0–100.0)
Monocytes Absolute: 0.9 10*3/uL (ref 0.1–1.0)
Monocytes Relative: 7 %
Neutro Abs: 11.1 10*3/uL — ABNORMAL HIGH (ref 1.7–7.7)
Neutrophils Relative %: 83 %
Platelets: 163 10*3/uL (ref 150–400)
RBC: 3.77 MIL/uL — ABNORMAL LOW (ref 4.22–5.81)
RDW: 13.1 % (ref 11.5–15.5)
WBC: 13.3 10*3/uL — ABNORMAL HIGH (ref 4.0–10.5)
nRBC: 0 % (ref 0.0–0.2)

## 2021-06-13 LAB — BASIC METABOLIC PANEL
Anion gap: 8 (ref 5–15)
BUN: 50 mg/dL — ABNORMAL HIGH (ref 8–23)
CO2: 24 mmol/L (ref 22–32)
Calcium: 8.5 mg/dL — ABNORMAL LOW (ref 8.9–10.3)
Chloride: 109 mmol/L (ref 98–111)
Creatinine, Ser: 3.31 mg/dL — ABNORMAL HIGH (ref 0.61–1.24)
GFR, Estimated: 17 mL/min — ABNORMAL LOW (ref >60)
Glucose, Bld: 174 mg/dL — ABNORMAL HIGH (ref 70–99)
Potassium: 3.6 mmol/L (ref 3.5–5.1)
Sodium: 141 mmol/L (ref 135–145)

## 2021-06-13 LAB — GLUCOSE, CAPILLARY
Glucose-Capillary: 152 mg/dL — ABNORMAL HIGH (ref 70–99)
Glucose-Capillary: 204 mg/dL — ABNORMAL HIGH (ref 70–99)
Glucose-Capillary: 233 mg/dL — ABNORMAL HIGH (ref 70–99)
Glucose-Capillary: 294 mg/dL — ABNORMAL HIGH (ref 70–99)

## 2021-06-13 MED ORDER — FLUCONAZOLE 200 MG PO TABS: 200.0000 mg | ORAL_TABLET | Freq: Every day | ORAL | 0 refills | Status: AC

## 2021-06-13 MED ORDER — PREGABALIN 25 MG PO CAPS: 25.0000 mg | ORAL_CAPSULE | Freq: Every evening | ORAL | 1 refills | Status: DC | PRN

## 2021-06-13 MED ORDER — FEXOFENADINE HCL 180 MG PO TABS: 180.0000 mg | ORAL_TABLET | Freq: Every day | ORAL | Status: DC | PRN

## 2021-06-13 MED ORDER — INSULIN LISPRO (1 UNIT DIAL) 100 UNIT/ML (KWIKPEN): 5.0000 [IU] | PEN_INJECTOR | Freq: Three times a day (TID) | SUBCUTANEOUS | 0 refills | Status: DC

## 2021-06-13 MED ORDER — PREGABALIN 25 MG PO CAPS
25.0000 mg | ORAL_CAPSULE | Freq: Every day | ORAL | Status: DC
  Administered 2021-06-13: 25 mg via ORAL
  Filled 2021-06-13: qty 1

## 2021-06-13 MED ORDER — AMLODIPINE BESYLATE 5 MG PO TABS: 5.0000 mg | ORAL_TABLET | Freq: Every day | ORAL | 1 refills | Status: DC

## 2021-06-13 MED ORDER — METOPROLOL TARTRATE 25 MG PO TABS
12.5000 mg | ORAL_TABLET | Freq: Two times a day (BID) | ORAL | 0 refills | Status: DC
Start: 2021-06-13 — End: 2021-06-30

## 2021-06-13 MED ORDER — ACETAMINOPHEN 325 MG PO TABS: 650.0000 mg | ORAL_TABLET | Freq: Four times a day (QID) | ORAL | 0 refills | Status: AC

## 2021-06-13 MED ORDER — LANTUS SOLOSTAR 100 UNIT/ML ~~LOC~~ SOPN: 14.0000 [IU] | PEN_INJECTOR | Freq: Every day | SUBCUTANEOUS | Status: DC

## 2021-06-13 MED ORDER — ROSUVASTATIN CALCIUM 5 MG PO TABS: 5.0000 mg | ORAL_TABLET | Freq: Every day | ORAL | 0 refills | Status: DC

## 2021-06-13 MED ORDER — NITROGLYCERIN 0.4 MG SL SUBL: 0.4000 mg | SUBLINGUAL_TABLET | SUBLINGUAL | Status: DC | PRN

## 2021-06-13 NOTE — TOC Transition Note (Signed)
Transition of Care [facility]) - CM/SW Discharge Note   Patient Details  Name: Luke Solis MRN: [id number] Date of Birth: 02-Nov-1931  [facility] CM/SW Contact:  Natasha Bence, LCSW Phone Number: 06/13/2021, 4:43 PM   Clinical Narrative:    CSW notified of patient's readiness for discharge. CSW confirmed hospital bed delivery with [facility] and patient's daughter. CSW notified Santiago Glad with [facility] of patient's discharge. Santiago Glad agreeable to resume [facility] services. TOC signing off.    Final next level of care: [place] Barriers to Discharge: Barriers Resolved   Patient Goals and CMS Choice Patient states their goals for this hospitalization and ongoing recovery are:: Return home with [facility] CMS Medicare.gov Compare Post Acute Care list provided to:: Patient Choice offered to / list presented to : Patient, Spouse, Adult Children  Discharge Placement                    Patient and family notified of of transfer: 06/13/21  Discharge Plan and Services In-house Referral: Clinical Social Work   Post Acute Care Choice: Museum/gallery conservator, Home Health          DME Arranged: Hospital bed DME Agency: [facility] Date DME Agency Contacted: 06/13/21 Time DME Agency Contacted: 1200 Representative spoke with at DME Agency: [place]: RN, PT, Social Work, Nurse's Aide [place] Agency: [place] Date [place]: 06/13/21 Time [place]: 1642 Representative spoke with at [place]: [place] ([place]) Interventions     Readmission Risk Interventions No flowsheet data found.

## 2021-06-13 NOTE — Discharge Instructions (Signed)
IMPORTANT INFORMATION: PAY CLOSE ATTENTION   PHYSICIAN DISCHARGE INSTRUCTIONS  Follow with Primary care provider  [name], Scott A, MD  and other consultants as instructed by your Hospitalist Physician  SEEK MEDICAL CARE OR RETURN TO EMERGENCY ROOM IF SYMPTOMS COME BACK, WORSEN OR NEW PROBLEM DEVELOPS   Please note: You were cared for by a hospitalist during your hospital stay. Every effort will be made to forward records to your primary care provider.  You can request that your primary care provider send for your hospital records if they have not received them.  Once you are discharged, your primary care physician will handle any further medical issues. Please note that NO REFILLS for any discharge medications will be authorized once you are discharged, as it is imperative that you return to your primary care physician (or establish a relationship with a primary care physician if you do not have one) for your post hospital discharge needs so that they can reassess your need for medications and monitor your lab values.  Please get a complete blood count and chemistry panel checked by your Primary MD at your next visit, and again as instructed by your Primary MD.  Get Medicines reviewed and adjusted: Please take all your medications with you for your next visit with your Primary MD  Laboratory/radiological data: Please request your Primary MD to go over all hospital tests and procedure/radiological results at the follow up, please ask your primary care provider to get all Hospital records sent to his/her office.  In some cases, they will be blood work, cultures and biopsy results pending at the time of your discharge. Please request that your primary care provider follow up on these results.  If you are diabetic, please bring your blood sugar readings with you to your follow up appointment with primary care.    Please call and make your follow up appointments as soon as possible.    Also Note  the following: If you experience worsening of your admission symptoms, develop shortness of breath, life threatening emergency, suicidal or homicidal thoughts you must seek medical attention immediately by calling 911 or calling your MD immediately  if symptoms less severe.  You must read complete instructions/literature along with all the possible adverse reactions/side effects for all the Medicines you take and that have been prescribed to you. Take any new Medicines after you have completely understood and accpet all the possible adverse reactions/side effects.   Do not drive when taking Pain medications or sleeping medications (Benzodiazepines)  Do not take more than prescribed Pain, Sleep and Anxiety Medications. It is not advisable to combine anxiety,sleep and pain medications without talking with your primary care practitioner  Special Instructions: If you have smoked or chewed Tobacco  in the last 2 yrs please stop smoking, stop any regular Alcohol  and or any Recreational drug use.  Wear Seat belts while driving.  Do not drive if taking any narcotic, mind altering or controlled substances or recreational drugs or alcohol.       

## 2021-06-13 NOTE — Discharge Summary (Signed)
Physician Discharge Summary  Luke Solis PPI:[id number] DOB: December 13, 1930 DOA: 06/08/2021  PCP: Kathyrn Drown, MD Urologist: Dr. Tresa Endo @ [place] date: 06/08/2021 Discharge date: 06/13/2021  Admitted From:  Home  Disposition: Home with [facility] and outpatient palliative care   Recommendations for Outpatient Follow-up:  Follow up with PCP in 1 weeks Follow up with urologist in 1-2 weeks Please check BMP in 1-2 weeks Continue oral fluconazole thru 7/22  Home Health: RN, PT, SW  Discharge Condition: Stable but guarded, hospice appropriate   CODE STATUS: FULL DIET: carb modified, heart healthy with comfort meals as appropriate for palliative/hospice care   Brief Hospitalization Summary: Please see all hospital notes, images, labs for full details of the hospitalization. 87 year old male with a history of prostate cancer, urethral stricture, diabetes mellitus type 2, CKD stage III, hypertension, hyperlipidemia, OSA, stroke, coronary artery disease, cognitive impairment presenting with 1 to 2-day history of fever, abdominal pain, and confusion.  The patient is a poor historian secondary to his cognitive impairment.  History is obtained from review of the medical record and speaking with the patient's spouse.  She states that the patient had been on antibiotics (Bactrim) for about 3 weeks up until the end of June.  At the end of June, the patient began having some suprapubic pain and dysuria.  The patient contacted his PCP.  An outpatient ultrasound revealed a postvoid residual with a 479 cc.  He went to [facility] ED on 06/05/2021 where he had a postvoid residual of 650 cc.  A Foley catheter was placed.  The patient was sent home with cefpodoxime.  Urine culture during that ED visit grew 10,000-50,000 colonies of Candida albicans.  Patient spouse states that the patient began developing fevers, chills, nausea and continued suprapubic pain.  The patient had worsening confusion and generalized weakness.  As  result, the patient was brought to emergency department for further evaluation.  Notably, the patient has had a chronic history of fecal incontinence.  In the emergency department, the patient had fever up to 102.9 F with tachycardia.  WBC increased to 11.5 and have downtrended to normal value (10.3) on 7.7.  He was hemodynamically stable. Blood culture collected on 7/4 grew C. albicans. Fluconazole IV was started 7/6. Further workup including TTE to r/o endocarditis showed no evidence of vegetation. Repeated blood culture collected 7/7, with NGTD. ID consult 7/8 with Dr. Baxter Flattery recommends obtaining a TEE for further evaluation of endocarditis as it would change management to include antibiotics. Palliative medicine was consulted and will discuss goals of care with patient and family.       Assessment & Plan:   Principal Problem:   UTI (urinary tract infection) Active Problems:   Hyperlipidemia   Essential hypertension, benign   OSA on CPAP   Poorly controlled type 2 diabetes mellitus with peripheral neuropathy (HCC)   SIRS (systemic inflammatory response syndrome) (HCC)   Hypokalemia   Leukocytosis   Hyperglycemia due to diabetes mellitus (HCC)   Chronic kidney disease   Sepsis due to undetermined organism (HCC)   CKD (chronic kidney disease) stage 3, GFR 30-59 ml/min (HCC)   Sepsis - RESOLVED -Present on admission -Presented with leukocytosis, fever, Tachycardia - MRSA negative, Urine and blood culture 7/4 grew Candida albicans. -Today, VS significant for tachypnea RR 26, HR 70, hypertensive to 167/81, satting appropriately at 95% on RA -Secondary to urinary source; per ID -Continue IV fluids but reduce rate -SEPSIS PHYSIOLOGY RESOLVED NOW - Transferred to floor 7/7  Candidemia - Blood culture collected on 7/4 grew C. Albicans. - WBC up to 14 today (normalized at 10 yesterday) - Repeated blood culture collected 7/6, NGTD - Possibly from urinary source, no suspicion for  prostatitis given lack of pain - Diflucan 200mg  - IDSA recommends 14 days after documentation of blood culture clearance; patient will need PICC line  - Prostatitis is less likely - patient had prostate cancer s/p TURP (not candidate for radical prostatectomy) - Endoophthalmitis is a concern of candidemia, though patient has no visual complaint. Ophthalmic screening is not to be pursued right now. - TTE 7/6 negative for vegetation, showed LVEF 70-75% - ID consulted, recommends TEE to further evaluate for endocarditis.   - discuss with family: they decided against pursuing TEE.  - ID final recommendations : complete full 14 day course of oral fluconazole - repeated blood cultures have been no growth to date   CAUTI  - candida >100k urine culture - catheter replaced 7/7 (straight tip 16 Fr) - patient experienced much discomfort, pain and hematuria. Urine color improved and patient is no longer complaining of pain today. - d/c zosyn -Pt treated with full 14 day course of fluconazole   Urethral stricture -Follow-up at [facility] Dr. Tresa Endo -He has foley in placed which was exchanged during this admission and will DC home with foley in place -Last dilated 03/26/2021   CKD stage IIIb -renally dose medication as appropriate, DC metformin, reduce dose of lyrica    Acute metabolic encephalopathy- RESOLVED -Secondary to sepsis -Patient has underlying cognitive impairment -06/12/2021--neurologically back to baseline with pleasant affect - SLT evaluating patient; recommends regular diet. Signed off at this time. - PT evaluated patient 7/6, recommending acute SNF, but family would prefer home health with outpatient palliative.    Uncontrolled diabetes mellitus type 2 with hyperglycemia -NovoLog sliding scale -03/25/2021 hemoglobin A1c 10.9 -DC metformin -Lantus increased dose to 14 units, plus added novolog 5 units TIDWC if eats 50% of meal, increase CBG monitoring to 5x/day.  CBG (last 3)   Recent Labs    06/13/21 0352 06/13/21 0735 06/13/21 1201  GLUCAP 152* 204* 233*    Essential hypertension - BP has been increasing to 170s-180s/ 80s-90s. - Increased amlodipine to 5.0mg  (from 2.5mg ) PO qAM - Added metoprolol 12.5mg  PO BID - Hydralazine  IV 10mg  PRN q6h for SBP >160, DBP >90   Hyperlipidemia -Continue Crestor but reduced dose to 5 mg daily    Hypokalemia/Hypomagnesemia - resolved -repleted   DVT prophylaxis: Enoxaparin Code Status: Full - palliative consulted and family would like outpatient palliative which is arranged and HH Family Communication: Daughter at bedside updated, wife updated 7/7 at bedside, palliative discussion 7/8 w/family Disposition Plan: home with home health PT and outpatient palliative care  Consultants:  Cardiology [facility]) Infectious diseases (Dr. Baxter Flattery) telephone consultations   Procedures: Plan for TEE pending Cardiology consult and family discussion with palliative care   Antimicrobials:  Diflucan 200mg  qd (start 7/6 - stop plan for 7/22)  Discharge Diagnoses:  Principal Problem:   UTI (urinary tract infection) Active Problems:   Hyperlipidemia   Essential hypertension, benign   OSA on CPAP   Poorly controlled type 2 diabetes mellitus with peripheral neuropathy (HCC)   SIRS (systemic inflammatory response syndrome) ([place])   Hypokalemia   Leukocytosis   Hyperglycemia due to diabetes mellitus ([place])   Chronic kidney disease   Sepsis due to undetermined organism ([place])   CKD (chronic kidney disease) stage 3, GFR 30-59 ml/min ([place])  Discharge Instructions:  Allergies as of 06/13/2021       Reactions   Broccoli [brassica Oleracea] Nausea And Vomiting   Celery Oil Nausea And Vomiting        Medication List     STOP taking these medications    aspirin 325 MG EC tablet   cefpodoxime 200 MG tablet Commonly known as: VANTIN   metFORMIN 500 MG tablet Commonly known as: GLUCOPHAGE   naproxen 500 MG  tablet Commonly known as: NAPROSYN       TAKE these medications    acetaminophen 325 MG tablet Commonly known as: TYLENOL Take 2 tablets (650 mg total) by mouth every 6 (six) hours for 14 days.   amLODipine 5 MG tablet Commonly known as: NORVASC Take 1 tablet (5 mg total) by mouth daily. What changed:  medication strength how much to take   Calcium Carbonate-Vitamin D 600-400 MG-UNIT tablet Take 1 tablet by mouth daily.   fexofenadine 180 MG tablet Commonly known as: ALLEGRA Take 1 tablet (180 mg total) by mouth daily as needed for allergies. What changed: See the new instructions.   fluconazole 200 MG tablet Commonly known as: DIFLUCAN Take 1 tablet (200 mg total) by mouth daily for 12 doses. Start taking on: June 14, 2021   ForaCare premium V10 Test test strip Generic drug: glucose blood USE AS DIRECTED.   Global Ease Inject Pen Needles 32G X 4 MM Misc Generic drug: Insulin Pen Needle USE TO INJECT LANTUS EVERY EVENING.   insulin lispro 100 UNIT/ML KwikPen Commonly known as: HumaLOG KwikPen Inject 5 Units into the skin 3 (three) times daily with meals. Give only if eats 50% or more of the meal.   Lantus SoloStar 100 UNIT/ML Solostar Pen Generic drug: insulin glargine Inject 14 Units into the skin at bedtime. What changed:  how much to take how to take this when to take this additional instructions   metoprolol tartrate 25 MG tablet Commonly known as: LOPRESSOR Take 0.5 tablets (12.5 mg total) by mouth 2 (two) times daily.   multivitamin tablet Take 1 tablet by mouth daily.   nitroGLYCERIN 0.4 MG SL tablet Commonly known as: NITROSTAT Place 1 tablet (0.4 mg total) under the tongue every 5 (five) minutes as needed for chest pain. What changed: See the new instructions.   pregabalin 25 MG capsule Commonly known as: LYRICA Take 1 capsule (25 mg total) by mouth at bedtime as needed (neuropathy pain). 1 in the morning 2 in the evening What changed:   medication strength how much to take how to take this when to take this reasons to take this   rosuvastatin 5 MG tablet Commonly known as: CRESTOR Take 1 tablet (5 mg total) by mouth daily. What changed:  medication strength how much to take       ASK your doctor about these medications    ketoconazole 2 % cream Commonly known as: NIZORAL APPLY THIN AMOUNT TWICE DAILY TO INFLAMMED AREA OF GENITALS.               Durable Medical Equipment  (From admission, onward)           Start     Ordered   06/12/21 1345  For home use only DME Hospital bed  Once       Question Answer Comment  Length of Need Lifetime   The above medical condition requires: Patient requires the ability to reposition frequently   Bed type Semi-electric  06/12/21 1344   06/11/21 1408  For home use only DME 3 n 1  Once        06/11/21 1407   06/10/21 1649  For home use only DME Walker rolling  Once       Question Answer Comment  Walker: With [place]   Patient needs a walker to treat with the following condition Gait instability      06/10/21 1648            Follow-up Information     [name], [name], MD Follow up in 1 week(s).   Specialty: Family Medicine Why: Hospital Follow Up Contact information: [place] [address] [phone]         Satira Sark, MD .   Specialty: Cardiology Contact information: [place] [address] [phone]         Myrlene Broker, MD. Schedule an appointment as soon as possible for a visit in 2 week(s).   Specialty: Urology Why: Hospital Follow Up Contact information: [address] [place] [address] [phone]                Allergies  Allergen Reactions   Broccoli [Brassica Oleracea] Nausea And Vomiting   Celery Oil Nausea And Vomiting   Allergies as of 06/13/2021       Reactions   Broccoli [brassica Oleracea] Nausea And Vomiting   Celery Oil  Nausea And Vomiting        Medication List     STOP taking these medications    aspirin 325 MG EC tablet   cefpodoxime 200 MG tablet Commonly known as: VANTIN   metFORMIN 500 MG tablet Commonly known as: GLUCOPHAGE   naproxen 500 MG tablet Commonly known as: NAPROSYN       TAKE these medications    acetaminophen 325 MG tablet Commonly known as: TYLENOL Take 2 tablets (650 mg total) by mouth every 6 (six) hours for 14 days.   amLODipine 5 MG tablet Commonly known as: NORVASC Take 1 tablet (5 mg total) by mouth daily. What changed:  medication strength how much to take   Calcium Carbonate-Vitamin D 600-400 MG-UNIT tablet Take 1 tablet by mouth daily.   fexofenadine 180 MG tablet Commonly known as: ALLEGRA Take 1 tablet (180 mg total) by mouth daily as needed for allergies. What changed: See the new instructions.   fluconazole 200 MG tablet Commonly known as: DIFLUCAN Take 1 tablet (200 mg total) by mouth daily for 12 doses. Start taking on: June 14, 2021   ForaCare premium V10 Test test strip Generic drug: glucose blood USE AS DIRECTED.   Global Ease Inject Pen Needles 32G X 4 MM Misc Generic drug: Insulin Pen Needle USE TO INJECT LANTUS EVERY EVENING.   insulin lispro 100 UNIT/ML KwikPen Commonly known as: HumaLOG KwikPen Inject 5 Units into the skin 3 (three) times daily with meals. Give only if eats 50% or more of the meal.   Lantus SoloStar 100 UNIT/ML Solostar Pen Generic drug: insulin glargine Inject 14 Units into the skin at bedtime. What changed:  how much to take how to take this when to take this additional instructions   metoprolol tartrate 25 MG tablet Commonly known as: LOPRESSOR Take 0.5 tablets (12.5 mg total) by mouth 2 (two) times daily.   multivitamin tablet Take 1 tablet by mouth daily.   nitroGLYCERIN 0.4 MG SL tablet Commonly known as: NITROSTAT Place 1  tablet (0.4 mg total) under the tongue every 5 (five) minutes as  needed for chest pain. What changed: See the new instructions.   pregabalin 25 MG capsule Commonly known as: LYRICA Take 1 capsule (25 mg total) by mouth at bedtime as needed (neuropathy pain). 1 in the morning 2 in the evening What changed:  medication strength how much to take how to take this when to take this reasons to take this   rosuvastatin 5 MG tablet Commonly known as: CRESTOR Take 1 tablet (5 mg total) by mouth daily. What changed:  medication strength how much to take       ASK your doctor about these medications    ketoconazole 2 % cream Commonly known as: NIZORAL APPLY THIN AMOUNT TWICE DAILY TO INFLAMMED AREA OF GENITALS.               Durable Medical Equipment  (From admission, onward)           Start     Ordered   06/12/21 1345  For home use only DME Hospital bed  Once       Question Answer Comment  Length of Need Lifetime   The above medical condition requires: Patient requires the ability to reposition frequently   Bed type Semi-electric      06/12/21 1344   06/11/21 1408  For home use only DME 3 n 1  Once        06/11/21 1407   06/10/21 1649  For home use only DME Walker rolling  Once       Question Answer Comment  Walker: With [place]   Patient needs a walker to treat with the following condition Gait instability      06/10/21 1648            Procedures/Studies: US Pelvis Limited  Result Date: 06/05/2021 CLINICAL DATA:  Pelvic pain, possible urinary retention EXAM: LIMITED ULTRASOUND OF PELVIS TECHNIQUE: Limited transabdominal ultrasound examination of the pelvis was performed. COMPARISON:  None. FINDINGS: Bladder is well distended. No filling defect is noted. Prevoid volume is 500 mL. Postvoid volume is 479 mL. IMPRESSION: Findings suggestive of bladder outlet obstruction. Electronically Signed   By: Inez Catalina M.D.   On: 06/05/2021 16:07   DG Chest Port 1 View  Result Date: 06/08/2021 CLINICAL DATA:  Possible  sepsis fever EXAM: PORTABLE CHEST 1 VIEW COMPARISON:  04/01/2015, 06/19/2020 FINDINGS: No focal opacity or pleural effusion. Stable cardiomediastinal silhouette with aortic atherosclerosis. No pneumothorax. IMPRESSION: No active disease. Electronically Signed   By: Donavan Foil M.D.   On: 06/08/2021 18:43   DG Swallowing Func-Speech Pathology  Result Date: 06/10/2021 Formatting of this result is different from the original. Objective Swallowing Evaluation: Type of Study: MBS-Modified Barium Swallow Study  Patient Details Name: Luke Solis MRN: [id number] Date of Birth: Oct 25, 1931 Today's Date: 06/10/2021 Time: SLP Start Time (ACUTE ONLY): 1149 -SLP Stop Time (ACUTE ONLY): 1208 SLP Time Calculation (min) (ACUTE ONLY): 19 min Past Medical History: Past Medical History: Diagnosis Date  Coronary atherosclerosis of native coronary artery   BMS proximal and mid RCA as well as BMS circumflex - 2002 (had residual 70% distal LAD), LVEF 60%  Diabetes mellitus without complication ([place])   Essential hypertension   Hyperlipidemia   Neuropathy   NSTEMI (non-ST elevated myocardial infarction) ([place])   2002  Prostate cancer ([place])   Sleep apnea   On CPAP  Stroke [facility])  Past Surgical History: Past Surgical History: Procedure  Laterality Date  COLONOSCOPY    INGUINAL HERNIA REPAIR    KNEE SURGERY    NOSE SURGERY    PROSTATE SURGERY    TONSILLECTOMY   HPI: Luke Solis is a 87 y.o. male with medical history significant for prostate cancer (follows with [facility] urology), T2DM, CKD stage 3A/B, hypertension, hyperlipidemia who resented to the ED accompanied by wife due to fever and abdominal pain.  Patient was recently treated for UTI (cefpodoxime) by his urologist about a week ago, few days ago, he continued to have suprapubic pain with difficulty in urination, PCP was consulted and recommended a bladder ultrasound after which patient was asked to go to an ED for Foley catheterization due to urinary retention, this was placed at [facility] ED on 06/05/2021, this morning patient was noted to be weak and as the day progressed, he became febrile and started to have chills, patient's urologist was contacted by phone and recommended that patient should go to the ED for further evaluation and management.  Patient denies nausea, vomiting, chest pain or shortness of breath. BSE requested due to Pt clearing throat and coughing after po per RN.  Subjective: "I like Pepsi, low sugar." Assessment / Plan / Recommendation CHL IP CLINICAL IMPRESSIONS 06/10/2021 Clinical Impression Pt presents with mild sensorimotor oropharyngeal dysphagia characterized by trace to mild silent aspiration with large presentations of thin liquids; also note slightly decreased oral coordination of bolus questionably related to decreased strength. Two visualized episodes of silent aspiration, one with large cup presentations and the second with consecutive sips via straw note premature spillage to the pyriforms resulting in trace to mild silent aspiration of thin liquids. Occasional flash penetration but no aspiration of thin liquids was noted with smaller sips and Pt benefits from the cue to hold liquids in oral cavity and swallow "hard and fast". Note mild valleculae residue across consistencies and trace pyriform residue. Pt did require additional time to thoroughly masticate solid textures; liquid wash facilitated AP transfer and swallowing trigger. Barium tablet was consumed without incident. Trials of NTL consumed without residue or penetration/aspiration. Note good epiglottic deflection and slightly decreased pharyngeal squeeze. Recommend continue with D3/mech soft diet (plan to upgrade to regular with next diet check if Pt continues to improve) and thin liquids with NO STRAWS and recommendation to take small sips. It is notable that in future hospitalizations or when Pt is deconditioned that a temporary downgrade to NTL may be indicated; this was also reviewed with Pt and  wife. Reviewed recommendations and results with Pt and Pt's wife at length. Also reviewed recommendations with RN. ST will continue to follow acutely. SLP Visit Diagnosis Dysphagia, unspecified (R13.10) Attention and concentration deficit following -- Frontal lobe and executive function deficit following -- Impact on safety and function Mild aspiration risk   CHL IP TREATMENT RECOMMENDATION 06/10/2021 Treatment Recommendations Therapy as outlined in treatment plan below   Prognosis 06/10/2021 Prognosis for Safe Diet Advancement Good Barriers to Reach Goals -- Barriers/Prognosis Comment -- CHL IP DIET RECOMMENDATION 06/10/2021 SLP Diet Recommendations Dysphagia 3 (Mech soft) solids;Thin liquid Liquid Administration via Cup;Straw Medication Administration Whole meds with liquid Compensations Slow rate;Small sips/bites;Lingual sweep for clearance of pocketing;Multiple dry swallows after each bite/sip;Follow solids with liquid Postural Changes Remain semi-upright after after feeds/meals (Comment);Seated upright at 90 degrees   CHL IP OTHER RECOMMENDATIONS 06/10/2021 Recommended Consults -- Oral Care Recommendations Oral care BID Other Recommendations Clarify dietary restrictions   CHL IP FOLLOW UP RECOMMENDATIONS 06/09/2021 Follow up Recommendations (  No Data)   CHL IP FREQUENCY AND DURATION 06/10/2021 Speech Therapy Frequency (ACUTE ONLY) min 2x/week Treatment Duration 1 week      CHL IP ORAL PHASE 06/10/2021 Oral Phase WFL Oral - Pudding Teaspoon -- Oral - Pudding Cup -- Oral - Honey Teaspoon -- Oral - Honey Cup -- Oral - Nectar Teaspoon -- Oral - Nectar Cup -- Oral - Nectar Straw -- Oral - Thin Teaspoon -- Oral - Thin Cup -- Oral - Thin Straw -- Oral - Puree -- Oral - Mech Soft -- Oral - Regular -- Oral - Multi-Consistency -- Oral - Pill -- Oral Phase - Comment --  CHL IP PHARYNGEAL PHASE 06/10/2021 Pharyngeal Phase Impaired Pharyngeal- Pudding Teaspoon -- Pharyngeal -- Pharyngeal- Pudding Cup -- Pharyngeal -- Pharyngeal- Honey  Teaspoon -- Pharyngeal -- Pharyngeal- Honey Cup -- Pharyngeal -- Pharyngeal- Nectar Teaspoon -- Pharyngeal -- Pharyngeal- Nectar Cup -- Pharyngeal -- Pharyngeal- Nectar Straw -- Pharyngeal -- Pharyngeal- Thin Teaspoon Pharyngeal residue - valleculae;Pharyngeal residue - pyriform;Reduced pharyngeal peristalsis;Delayed swallow initiation-pyriform sinuses;Reduced airway/laryngeal closure;Penetration/Aspiration before swallow Pharyngeal Material enters airway, remains ABOVE vocal cords then ejected out Pharyngeal- Thin Cup Pharyngeal residue - valleculae;Pharyngeal residue - pyriform;Reduced pharyngeal peristalsis;Delayed swallow initiation-pyriform sinuses;Reduced airway/laryngeal closure;Penetration/Aspiration before swallow Pharyngeal Material enters airway, passes BELOW cords without attempt by patient to eject out (silent aspiration) Pharyngeal- Thin Straw Pharyngeal residue - valleculae;Pharyngeal residue - pyriform;Reduced pharyngeal peristalsis;Delayed swallow initiation-pyriform sinuses;Reduced airway/laryngeal closure;Penetration/Aspiration before swallow Pharyngeal Material enters airway, passes BELOW cords without attempt by patient to eject out (silent aspiration) Pharyngeal- Puree Reduced pharyngeal peristalsis;Pharyngeal residue - valleculae Pharyngeal Material does not enter airway Pharyngeal- Mechanical Soft NT Pharyngeal -- Pharyngeal- Regular Reduced pharyngeal peristalsis;Pharyngeal residue - valleculae Pharyngeal Material does not enter airway Pharyngeal- Multi-consistency NT Pharyngeal -- Pharyngeal- Pill WFL Pharyngeal Material does not enter airway Pharyngeal Comment --  CHL IP CERVICAL ESOPHAGEAL PHASE 06/10/2021 Cervical Esophageal Phase WFL Pudding Teaspoon -- Pudding Cup -- Honey Teaspoon -- Honey Cup -- Nectar Teaspoon -- Nectar Cup -- Nectar Straw -- Thin Teaspoon -- Thin Cup -- Thin Straw -- Puree -- Mechanical Soft -- Regular -- Multi-consistency -- Pill -- Cervical Esophageal Comment --  Amelia H. Roddie Mc, CCC-SLP Speech Language Pathologist Wende Bushy 06/10/2021, 4:32 PM              ECHOCARDIOGRAM COMPLETE BUBBLE STUDY  Result Date: 06/11/2021    ECHOCARDIOGRAM REPORT   Patient Name:   Luke Solis Date of Exam: 06/11/2021 Medical Rec #:  [id number]     Height:       74.0 in Accession #:    [id number]    Weight:       160.1 lb Date of Birth:  Dec 22, 1930      BSA:          1.977 m Patient Age:    66 years      BP:           149/72 mmHg Patient Gender: M             HR:           66 bpm. Exam Location:  [facility] Procedure: 2D Echo, Cardiac Doppler and Color Doppler Indications:    Candidemia ([place])  History:        Patient has prior history of Echocardiogram examinations, most                 recent 03/01/2018. CAD; Risk Factors:Hypertension and Diabetes.  Sonographer:    Wenda Low  Referring Phys: Joseph  1. Left ventricular ejection fraction, by estimation, is 70 to 75%. The left ventricle has hyperdynamic function. The left ventricle has no regional wall motion abnormalities. There is mild left ventricular hypertrophy. Left ventricular diastolic parameters are indeterminate.  2. Right ventricular systolic function is normal. The right ventricular size is normal.  3. The mitral valve is normal in structure. Mild mitral valve regurgitation.  4. The aortic valve is tricuspid. Aortic valve regurgitation is not visualized. Mild to moderate aortic valve sclerosis/calcification is present, without any evidence of aortic stenosis. FINDINGS  Left Ventricle: Left ventricular ejection fraction, by estimation, is 70 to 75%. The left ventricle has hyperdynamic function. The left ventricle has no regional wall motion abnormalities. The left ventricular internal cavity size was normal in size. There is mild left ventricular hypertrophy. Left ventricular diastolic parameters are indeterminate. Right Ventricle: The right ventricular size is normal. Right vetricular  wall thickness was not assessed. Right ventricular systolic function is normal. Left Atrium: Left atrial size was normal in size. Right Atrium: Right atrial size was normal in size. Pericardium: There is no evidence of pericardial effusion. Mitral Valve: The mitral valve is normal in structure. Mild mitral valve regurgitation. MV peak gradient, 4.9 mmHg. The mean mitral valve gradient is 2.0 mmHg. Tricuspid Valve: The tricuspid valve is normal in structure. Tricuspid valve regurgitation is trivial. Aortic Valve: The aortic valve is tricuspid. Aortic valve regurgitation is not visualized. Mild to moderate aortic valve sclerosis/calcification is present, without any evidence of aortic stenosis. Aortic valve mean gradient measures 4.0 mmHg. Aortic valve peak gradient measures 8.6 mmHg. Pulmonic Valve: The pulmonic valve was grossly normal. Pulmonic valve regurgitation is not visualized. Aorta: The aortic root is normal in size and structure. IAS/Shunts: No atrial level shunt detected by color flow Doppler.  LEFT VENTRICLE PLAX 2D LVIDd:         4.25 cm Diastology LVIDs:         2.57 cm LV e' medial:    7.97 cm/s LV PW:         1.07 cm LV E/e' medial:  10.7 LV IVS:        1.35 cm LV e' lateral:   8.41 cm/s                        LV E/e' lateral: 10.1  RIGHT VENTRICLE RV Basal diam:  3.27 cm RV Mid diam:    3.28 cm RV S prime:     16.40 cm/s TAPSE (M-mode): 2.7 cm LEFT ATRIUM             Index       RIGHT ATRIUM           Index LA diam:        4.10 cm 2.07 cm/m  RA Area:     17.40 cm LA Vol (A2C):   55.1 ml 27.87 ml/m RA Volume:   43.40 ml  21.95 ml/m LA Vol (A4C):   50.9 ml 25.74 ml/m LA Biplane Vol: 56.8 ml 28.73 ml/m  AORTIC VALVE AV Vmax:           147.00 cm/s AV Vmean:          98.000 cm/s AV VTI:            0.341 m AV Peak Grad:      8.6 mmHg AV Mean Grad:      4.0 mmHg LVOT Vmax:  113.00 cm/s LVOT Vmean:        76.800 cm/s LVOT VTI:          0.269 m LVOT/AV VTI ratio: 0.79  AORTA Ao Root diam: 2.65 cm  MITRAL VALVE MV Area (PHT): 3.54 cm     SHUNTS MV Peak grad:  4.9 mmHg     Systemic VTI: 0.27 m MV Mean grad:  2.0 mmHg MV Vmax:       1.11 m/s MV Vmean:      60.4 cm/s MV Decel Time: 214 msec MV E velocity: 85.30 cm/s MV A velocity: 102.00 cm/s MV E/A ratio:  0.84 Dorris Carnes MD Electronically signed by Dorris Carnes MD Signature Date/Time: 06/11/2021/4:43:14 PM    Final      Subjective: Pt has been resting much better in last several hours, he has been eating well and not complaining of pain or discomfort.  Urine is flowing freely from foley catheter.  No further blood seen in foley bag. Pt wanting to go home, asking for ice cream.    Discharge Exam: Vitals:   06/13/21 0531 06/13/21 1312  BP: (!) 155/77 133/73  Pulse: 75 70  Resp: 16 17  Temp: 98.7 F (37.1 C) 98.2 F (36.8 C)  SpO2: 91% 92%   Vitals:   06/12/21 1351 06/12/21 2113 06/13/21 0531 06/13/21 1312  BP: 124/79 (!) 131/59 (!) 155/77 133/73  Pulse: 76 71 75 70  Resp: 18 20 16 17   Temp: 97.7 F (36.5 C) 99 F (37.2 C) 98.7 F (37.1 C) 98.2 F (36.8 C)  TempSrc: Oral Oral  Oral  SpO2: 95% 94% 91% 92%  Weight:      Height:       General exam: frail, elderly, intermittently confused, chronically ill appearing male, Appears calm and comfortable, lying in bed with no acute distress.  He remembers my name.  He is interactive with family at bedside.  Respiratory system: Clear to auscultation. Respiratory effort normal. Cardiovascular system: S1 & S2 heard. No JVD, murmurs, rubs, gallops or clicks. No pedal edema. Gastrointestinal system: Abdomen is nondistended, soft and nontender. No organomegaly or masses felt. Normal bowel sounds heard. Central nervous system: Alert and oriented to person only. No focal neurological deficits. confused - keeps raising right hand without intentionally want to do so. Responding to questions. CN II-XII grossly intact.  He has dementia.  Extremities: Symmetric 5 x 5 power. Skin: No rashes, lesions or  ulcers.   Psychiatry: Mood & affect appropriate.   The results of significant diagnostics from this hospitalization (including imaging, microbiology, ancillary and laboratory) are listed below for reference.     Microbiology: Recent Results (from the past 240 hour(s))  Resp Panel by RT-PCR (Flu A&B, Covid) Nasopharyngeal Swab     Status: None   Collection Time: 06/08/21  6:22 PM   Specimen: Nasopharyngeal Swab; Nasopharyngeal(NP) swabs in vial transport medium  Result Value Ref Range Status   SARS Coronavirus 2 by RT PCR NEGATIVE NEGATIVE Final    Comment: (NOTE) SARS-CoV-2 target nucleic acids are NOT DETECTED.  The SARS-CoV-2 RNA is generally detectable in upper respiratory specimens during the acute phase of infection. The lowest concentration of SARS-CoV-2 viral copies this assay can detect is 138 copies/mL. A negative result does not preclude SARS-Cov-2 infection and should not be used as the sole basis for treatment or other patient management decisions. A negative result may occur with  improper specimen collection/handling, submission of specimen other than nasopharyngeal swab, presence of viral  mutation(s) within the areas targeted by this assay, and inadequate number of viral copies(<138 copies/mL). A negative result must be combined with clinical observations, patient history, and epidemiological information. The expected result is Negative.  Fact Sheet for Patients:  EntrepreneurPulse.com.au  Fact Sheet for Healthcare Providers:  IncredibleEmployment.be  This test is no t yet approved or cleared by the [place] FDA and  has been authorized for detection and/or diagnosis of SARS-CoV-2 by FDA under an Emergency Use Authorization (EUA). This EUA will remain  in effect (meaning this test can be used) for the duration of the COVID-19 declaration under Section 564(b)(1) of the Act, 21 U.S.C.section 360bbb-3(b)(1), unless the  authorization is terminated  or revoked sooner.       Influenza A by PCR NEGATIVE NEGATIVE Final   Influenza B by PCR NEGATIVE NEGATIVE Final    Comment: (NOTE) The Xpert Xpress SARS-CoV-2/FLU/RSV plus assay is intended as an aid in the diagnosis of influenza from Nasopharyngeal swab specimens and should not be used as a sole basis for treatment. Nasal washings and aspirates are unacceptable for Xpert Xpress SARS-CoV-2/FLU/RSV testing.  Fact Sheet for Patients: EntrepreneurPulse.com.au  Fact Sheet for Healthcare Providers: IncredibleEmployment.be  This test is not yet approved or cleared by the [place] FDA and has been authorized for detection and/or diagnosis of SARS-CoV-2 by FDA under an Emergency Use Authorization (EUA). This EUA will remain in effect (meaning this test can be used) for the duration of the COVID-19 declaration under Section 564(b)(1) of the Act, 21 U.S.C. section 360bbb-3(b)(1), unless the authorization is terminated or revoked.  Performed at [facility], [address] [place], [place] [address]   Blood Culture (routine x 2)     Status: Abnormal (Preliminary result)   Collection Time: 06/08/21  6:40 PM   Specimen: BLOOD  Result Value Ref Range Status   Specimen Description   Final    BLOOD LEFT ANTECUBITAL Performed at [facility], [address], [place], [place] [address]    Special Requests   Final    BOTTLES DRAWN AEROBIC AND ANAEROBIC Blood Culture adequate volume Performed at [facility], [address] [place], [place] [address]    Culture  Setup Time   Final    AEROBIC BOTTLE ONLY YEAST Gram Stain Report Called to,Read Back By and Verified With: R MAYNARD,[id number]  06/09/21 [facility] CRITICAL RESULT CALLED TO, READ BACK BY AND VERIFIED WITH: Shawnee Knapp RN, AT [phone] 06/10/21 D.VANHOOK    Culture (A)  Final    CANDIDA ALBICANS Sent to [facility] for further susceptibility testing. Performed at [facility], [place] [address] [place], [place] [address]    Report Status PENDING  Incomplete  Blood Culture ID Panel (Reflexed)     Status: Abnormal   Collection Time: 06/08/21  6:40 PM  Result Value Ref Range Status   Enterococcus faecalis NOT DETECTED NOT DETECTED Final   Enterococcus Faecium NOT DETECTED NOT DETECTED Final   Listeria monocytogenes NOT DETECTED NOT DETECTED Final   Staphylococcus species NOT DETECTED NOT DETECTED Final   Staphylococcus aureus (BCID) NOT DETECTED NOT DETECTED Final   Staphylococcus epidermidis NOT DETECTED NOT DETECTED Final   Staphylococcus lugdunensis NOT DETECTED NOT DETECTED Final   Streptococcus species NOT DETECTED NOT DETECTED Final   Streptococcus agalactiae NOT DETECTED NOT DETECTED Final   Streptococcus pneumoniae NOT DETECTED NOT DETECTED Final   Streptococcus pyogenes NOT DETECTED NOT DETECTED Final   A.calcoaceticus-baumannii NOT DETECTED NOT DETECTED Final   Bacteroides fragilis NOT  DETECTED NOT DETECTED Final   Enterobacterales NOT DETECTED NOT DETECTED Final   Enterobacter cloacae complex NOT DETECTED NOT DETECTED Final   Escherichia coli NOT DETECTED NOT DETECTED Final   Klebsiella aerogenes NOT DETECTED NOT DETECTED Final   Klebsiella oxytoca NOT DETECTED NOT DETECTED Final   Klebsiella pneumoniae NOT DETECTED NOT DETECTED Final   Proteus species NOT DETECTED NOT DETECTED Final   Salmonella species NOT DETECTED NOT DETECTED Final   Serratia marcescens NOT DETECTED NOT DETECTED Final   Haemophilus influenzae NOT DETECTED NOT DETECTED Final   Neisseria meningitidis NOT DETECTED NOT DETECTED Final   Pseudomonas aeruginosa NOT DETECTED NOT DETECTED Final   Stenotrophomonas maltophilia NOT DETECTED NOT DETECTED Final   Candida albicans DETECTED (A) NOT DETECTED Final    Comment: CRITICAL RESULT CALLED TO, READ BACK BY AND VERIFIED WITH: REnriqueta Shutter RN, AT [phone] 06/10/21 D.[facility]ANHOOK    Candida auris NOT DETECTED NOT DETECTED Final   Candida  glabrata NOT DETECTED NOT DETECTED Final   Candida krusei NOT DETECTED NOT DETECTED Final   Candida parapsilosis NOT DETECTED NOT DETECTED Final   Candida tropicalis NOT DETECTED NOT DETECTED Final   Cryptococcus neoformans/gattii NOT DETECTED NOT DETECTED Final    Comment: Performed at [facility], [address], [place], [place] [address]  Blood Culture (routine x 2)     Status: Abnormal (Preliminary result)   Collection Time: 06/08/21  6:57 PM   Specimen: BLOOD  Result Value Ref Range Status   Specimen Description   Final    BLOOD RIGHT ANTECUBITAL Performed at [facility], [address], [place], [place] [address]    Special Requests   Final    BOTTLES DRAWN AEROBIC AND ANAEROBIC Blood Culture adequate volume Performed at [facility], [address], [place], [place] [address]    Culture  Setup Time   Final    IN BOTH AEROBIC AND ANAEROBIC BOTTLES YEAST Gram Stain Report Called to,Read Back By and Verified With: R MAYNARD,[id number]  06/09/21 [facility] Performed at [facility], [address] [place], [place] [address]    Culture CANDIDA ALBICANS (A)  Final   Report Status PENDING  Incomplete  Urine culture     Status: Abnormal   Collection Time: 06/08/21  7:30 PM   Specimen: In/Out Cath Urine  Result Value Ref Range Status   Specimen Description   Final    IN/OUT CATH URINE Performed at [facility], [address] [place], [place] [address]    Special Requests   Final    NONE Performed at [facility], [address] [place], [place] [address]    Culture >=100,000 COLONIES/mL YEAST (A)  Final   Report Status 06/10/2021 FINAL  Final  MRSA Next Gen by PCR, Nasal     Status: None   Collection Time: 06/09/21  5:51 AM   Specimen: Nasal Mucosa; Nasal Swab  Result Value Ref Range Status   MRSA by PCR Next Gen NOT DETECTED NOT DETECTED Final    Comment: (NOTE) The GeneXpert MRSA Assay (FDA approved for NASAL specimens only), is one component of a comprehensive MRSA  colonization surveillance program. It is not intended to diagnose MRSA infection nor to guide or monitor treatment for MRSA infections. Test performance is not FDA approved in patients less than 33 years old. Performed at [facility], [address], [place], [place] [address]   Culture, blood (Routine X 2) w Reflex to ID Panel     Status: None (Preliminary result)  Collection Time: 06/11/21  3:09 AM   Specimen: BLOOD RIGHT HAND  Result Value Ref Range Status   Specimen Description BLOOD RIGHT HAND  Final   Special Requests   Final    BOTTLES DRAWN AEROBIC AND ANAEROBIC Blood Culture adequate volume   Culture   Final    NO GROWTH 2 DAYS Performed at [facility], [address] [place], [place] [address]    Report Status PENDING  Incomplete  Culture, blood (Routine X 2) w Reflex to ID Panel     Status: None (Preliminary result)   Collection Time: 06/11/21  3:13 AM   Specimen: BLOOD LEFT HAND  Result Value Ref Range Status   Specimen Description BLOOD LEFT HAND  Final   Special Requests   Final    BOTTLES DRAWN AEROBIC AND ANAEROBIC Blood Culture adequate volume   Culture   Final    NO GROWTH 2 DAYS Performed at [facility], [address] [place], [place] [address]    Report Status PENDING  Incomplete     Labs: BNP (last 3 results) No results for input(s): BNP in the last 8760 hours. Basic Metabolic Panel: Recent Labs  Lab 06/09/21 0517 06/10/21 0405 06/11/21 0308 06/12/21 0435 06/13/21 0346  NA 137 140 139 142 141  K 3.3* 3.8 3.2* 3.2* 3.6  CL 104 105 105 105 109  CO2 22 24 26 24 24   GLUCOSE 245* 226* 156* 200* 174*  BUN 25* 34* 38* 43* 50*  CREATININE 1.55* 1.81* 1.73* 2.53* 3.31*  CALCIUM 8.5* 8.6* 8.5* 8.9 8.5*  MG 1.6* 2.3 2.1 2.1  --   PHOS 1.6*  --   --   --   --    Liver Function Tests: Recent Labs  Lab 06/08/21 1856 06/09/21 0517 06/11/21 0308  AST 49* 63* 43*  ALT 32 52* 42  ALKPHOS 88 73 77  BILITOT 1.1 0.7 0.5  PROT 8.4* 6.5 6.1*  ALBUMIN  3.7 2.8* 2.5*   No results for input(s): LIPASE, AMYLASE in the last 168 hours. No results for input(s): AMMONIA in the last 168 hours. CBC: Recent Labs  Lab 06/08/21 1856 06/09/21 0517 06/10/21 0405 06/11/21 0308 06/12/21 0435 06/13/21 0346  WBC 11.1* 11.5* 13.6* 10.3 14.2* 13.3*  NEUTROABS 9.8*  --   --  8.5* 12.4* 11.1*  HGB 13.9 11.4* 12.1* 10.7* 12.5* 11.6*  HCT 42.5 33.9* 37.6* 32.6* 38.0* 34.9*  MCV 93.6 92.1 94.9 92.1 92.7 92.6  PLT 189 144* 150 143* 173 163   Cardiac Enzymes: No results for input(s): CKTOTAL, CKMB, CKMBINDEX, TROPONINI in the last 168 hours. BNP: Invalid input(s): POCBNP CBG: Recent Labs  Lab 06/12/21 1613 06/12/21 2111 06/13/21 0352 06/13/21 0735 06/13/21 1201  GLUCAP 201* 358* 152* 204* 233*   D-Dimer No results for input(s): DDIMER in the last 72 hours. Hgb A1c No results for input(s): HGBA1C in the last 72 hours. Lipid Profile No results for input(s): CHOL, HDL, LDLCALC, TRIG, CHOLHDL, LDLDIRECT in the last 72 hours. Thyroid function studies No results for input(s): TSH, T4TOTAL, T3FREE, THYROIDAB in the last 72 hours.  Invalid input(s): FREET3 Anemia work up No results for input(s): VITAMINB12, FOLATE, FERRITIN, TIBC, IRON, RETICCTPCT in the last 72 hours. Urinalysis    Component Value Date/Time   COLORURINE YELLOW 06/08/2021 1930   APPEARANCEUR CLOUDY (A) 06/08/2021 1930   LABSPEC 1.018 06/08/2021 1930   PHURINE 5.0 06/08/2021 1930   GLUCOSEU >=500 (A) 06/08/2021 1930   HGBUR LARGE (A) 06/08/2021 1930  BILIRUBINUR NEGATIVE 06/08/2021 1930   BILIRUBINUR + 12/22/2018 1540   KETONESUR NEGATIVE 06/08/2021 1930   PROTEINUR 100 (A) 06/08/2021 1930   UROBILINOGEN 0.2 09/11/2007 1600   NITRITE NEGATIVE 06/08/2021 1930   LEUKOCYTESUR LARGE (A) 06/08/2021 1930   Sepsis Labs Invalid input(s): PROCALCITONIN,  WBC,  LACTICIDVEN Microbiology Recent Results (from the past 240 hour(s))  Resp Panel by RT-PCR (Flu A&B, Covid)  Nasopharyngeal Swab     Status: None   Collection Time: 06/08/21  6:22 PM   Specimen: Nasopharyngeal Swab; Nasopharyngeal(NP) swabs in vial transport medium  Result Value Ref Range Status   SARS Coronavirus 2 by RT PCR NEGATIVE NEGATIVE Final    Comment: (NOTE) SARS-CoV-2 target nucleic acids are NOT DETECTED.  The SARS-CoV-2 RNA is generally detectable in upper respiratory specimens during the acute phase of infection. The lowest concentration of SARS-CoV-2 viral copies this assay can detect is 138 copies/mL. A negative result does not preclude SARS-Cov-2 infection and should not be used as the sole basis for treatment or other patient management decisions. A negative result may occur with  improper specimen collection/handling, submission of specimen other than nasopharyngeal swab, presence of viral mutation(s) within the areas targeted by this assay, and inadequate number of viral copies(<138 copies/mL). A negative result must be combined with clinical observations, patient history, and epidemiological information. The expected result is Negative.  Fact Sheet for Patients:  EntrepreneurPulse.com.au  Fact Sheet for Healthcare Providers:  IncredibleEmployment.be  This test is no t yet approved or cleared by the [place] FDA and  has been authorized for detection and/or diagnosis of SARS-CoV-2 by FDA under an Emergency Use Authorization (EUA). This EUA will remain  in effect (meaning this test can be used) for the duration of the COVID-19 declaration under Section 564(b)(1) of the Act, 21 U.S.C.section 360bbb-3(b)(1), unless the authorization is terminated  or revoked sooner.       Influenza A by PCR NEGATIVE NEGATIVE Final   Influenza B by PCR NEGATIVE NEGATIVE Final    Comment: (NOTE) The Xpert Xpress SARS-CoV-2/FLU/RSV plus assay is intended as an aid in the diagnosis of influenza from Nasopharyngeal swab specimens and should not be  used as a sole basis for treatment. Nasal washings and aspirates are unacceptable for Xpert Xpress SARS-CoV-2/FLU/RSV testing.  Fact Sheet for Patients: EntrepreneurPulse.com.au  Fact Sheet for Healthcare Providers: IncredibleEmployment.be  This test is not yet approved or cleared by the [place] FDA and has been authorized for detection and/or diagnosis of SARS-CoV-2 by FDA under an Emergency Use Authorization (EUA). This EUA will remain in effect (meaning this test can be used) for the duration of the COVID-19 declaration under Section 564(b)(1) of the Act, 21 U.S.C. section 360bbb-3(b)(1), unless the authorization is terminated or revoked.  Performed at [facility], [address] [place], [place] [address]   Blood Culture (routine x 2)     Status: Abnormal (Preliminary result)   Collection Time: 06/08/21  6:40 PM   Specimen: BLOOD  Result Value Ref Range Status   Specimen Description   Final    BLOOD LEFT ANTECUBITAL Performed at [facility], [address], [place], [place] [address]    Special Requests   Final    BOTTLES DRAWN AEROBIC AND ANAEROBIC Blood Culture adequate volume Performed at [facility], [address], [place], [place] [address]    Culture  Setup Time   Final    AEROBIC BOTTLE ONLY YEAST Gram Stain Report Called to,Read Back By and Verified With:  R MAYNARD,[email or web address]  06/09/21 [facility] CRITICAL RESULT CALLED TO, READ BACK BY AND VERIFIED WITHShawnee Knapp RN, AT [phone] 06/10/21 D.VANHOOK    Culture (A)  Final    CANDIDA ALBICANS Sent to [facility] for further susceptibility testing. Performed at [facility], [address] [place], [place] [address]    Report Status PENDING  Incomplete  Blood Culture ID Panel (Reflexed)     Status: Abnormal   Collection Time: 06/08/21  6:40 PM  Result Value Ref Range Status   Enterococcus faecalis NOT DETECTED NOT DETECTED Final   Enterococcus Faecium NOT DETECTED NOT DETECTED  Final   Listeria monocytogenes NOT DETECTED NOT DETECTED Final   Staphylococcus species NOT DETECTED NOT DETECTED Final   Staphylococcus aureus (BCID) NOT DETECTED NOT DETECTED Final   Staphylococcus epidermidis NOT DETECTED NOT DETECTED Final   Staphylococcus lugdunensis NOT DETECTED NOT DETECTED Final   Streptococcus species NOT DETECTED NOT DETECTED Final   Streptococcus agalactiae NOT DETECTED NOT DETECTED Final   Streptococcus pneumoniae NOT DETECTED NOT DETECTED Final   Streptococcus pyogenes NOT DETECTED NOT DETECTED Final   A.calcoaceticus-baumannii NOT DETECTED NOT DETECTED Final   Bacteroides fragilis NOT DETECTED NOT DETECTED Final   Enterobacterales NOT DETECTED NOT DETECTED Final   Enterobacter cloacae complex NOT DETECTED NOT DETECTED Final   Escherichia coli NOT DETECTED NOT DETECTED Final   Klebsiella aerogenes NOT DETECTED NOT DETECTED Final   Klebsiella oxytoca NOT DETECTED NOT DETECTED Final   Klebsiella pneumoniae NOT DETECTED NOT DETECTED Final   Proteus species NOT DETECTED NOT DETECTED Final   Salmonella species NOT DETECTED NOT DETECTED Final   Serratia marcescens NOT DETECTED NOT DETECTED Final   Haemophilus influenzae NOT DETECTED NOT DETECTED Final   Neisseria meningitidis NOT DETECTED NOT DETECTED Final   Pseudomonas aeruginosa NOT DETECTED NOT DETECTED Final   Stenotrophomonas maltophilia NOT DETECTED NOT DETECTED Final   Candida albicans DETECTED (A) NOT DETECTED Final    Comment: CRITICAL RESULT CALLED TO, READ BACK BY AND VERIFIED WITH: REnriqueta Shutter RN, AT [phone] 06/10/21 D.VANHOOK    Candida auris NOT DETECTED NOT DETECTED Final   Candida glabrata NOT DETECTED NOT DETECTED Final   Candida krusei NOT DETECTED NOT DETECTED Final   Candida parapsilosis NOT DETECTED NOT DETECTED Final   Candida tropicalis NOT DETECTED NOT DETECTED Final   Cryptococcus neoformans/gattii NOT DETECTED NOT DETECTED Final    Comment: Performed at [facility], [address] [place], [place] [address]  Blood Culture (routine x 2)     Status: Abnormal (Preliminary result)   Collection Time: 06/08/21  6:57 PM   Specimen: BLOOD  Result Value Ref Range Status   Specimen Description   Final    BLOOD RIGHT ANTECUBITAL Performed at [facility], [address] [place], [place] [address]    Special Requests   Final    BOTTLES DRAWN AEROBIC AND ANAEROBIC Blood Culture adequate volume Performed at [facility], [address] [place], [place] [address]    Culture  Setup Time   Final    IN BOTH AEROBIC AND ANAEROBIC BOTTLES YEAST Gram Stain Report Called to,Read Back By and Verified With: R MAYNARD,[id number]  06/09/21 [facility] Performed at [facility], [address], [place], [place] [address]    Culture CANDIDA ALBICANS (A)  Final   Report Status PENDING  Incomplete  Urine culture     Status: Abnormal   Collection Time: 06/08/21  7:30 PM   Specimen: In/Out Cath Urine  Result Value Ref  Range Status   Specimen Description   Final    IN/OUT CATH URINE Performed at [facility], [address], [place], [place] [address]    Special Requests   Final    NONE Performed at [facility], [address], [place], [place] [address]    Culture >=100,000 COLONIES/mL YEAST (A)  Final   Report Status 06/10/2021 FINAL  Final  MRSA Next Gen by PCR, Nasal     Status: None   Collection Time: 06/09/21  5:51 AM   Specimen: Nasal Mucosa; Nasal Swab  Result Value Ref Range Status   MRSA by PCR Next Gen NOT DETECTED NOT DETECTED Final    Comment: (NOTE) The GeneXpert MRSA Assay (FDA approved for NASAL specimens only), is one component of a comprehensive MRSA colonization surveillance program. It is not intended to diagnose MRSA infection nor to guide or monitor treatment for MRSA infections. Test performance is not FDA approved in patients less than 63 years old. Performed at [facility], [address], [place], [place] [address]   Culture, blood (Routine X 2) w Reflex to ID  Panel     Status: None (Preliminary result)   Collection Time: 06/11/21  3:09 AM   Specimen: BLOOD RIGHT HAND  Result Value Ref Range Status   Specimen Description BLOOD RIGHT HAND  Final   Special Requests   Final    BOTTLES DRAWN AEROBIC AND ANAEROBIC Blood Culture adequate volume   Culture   Final    NO GROWTH 2 DAYS Performed at [facility], [address] [place], [place] [address]    Report Status PENDING  Incomplete  Culture, blood (Routine X 2) w Reflex to ID Panel     Status: None (Preliminary result)   Collection Time: 06/11/21  3:13 AM   Specimen: BLOOD LEFT HAND  Result Value Ref Range Status   Specimen Description BLOOD LEFT HAND  Final   Special Requests   Final    BOTTLES DRAWN AEROBIC AND ANAEROBIC Blood Culture adequate volume   Culture   Final    NO GROWTH 2 DAYS Performed at [facility], [address] [place], [place] [address]    Report Status PENDING  Incomplete    Time coordinating discharge: 48 mins   SIGNED:  Irwin Brakeman, MD  [facility] 06/13/2021, 1:27 PM How to contact the [facility] Attending or Consulting provider [place] or covering provider during after hours [place], for this patient?  Check the care team in [facility] and look for a) attending/consulting [facility] provider listed and b) the [facility] team listed Log into [email or web address] and use [place]'s universal password to access. If you do not have the password, please contact the hospital operator. Locate the [facility] provider you are looking for under [facility] and page to a number that you can be directly reached. If you still have difficulty reaching the provider, please page the [facility] (Director on Call) for the Hospitalists listed on amion for assistance.

## 2021-06-13 NOTE — Progress Notes (Signed)
Patient has been sleeping sine this nurse arrived on shift. This nurse entered room to give patient his medications and daughter requested to hold medications at this time due to patient sleeping and reported patient not sleeping "this good" since admission. Will attempt meds after patient awakes, will continue to monitor.

## 2021-06-14 DIAGNOSIS — Z794 Long term (current) use of insulin: Secondary | ICD-10-CM | POA: Diagnosis not present

## 2021-06-14 DIAGNOSIS — N1832 Chronic kidney disease, stage 3b: Secondary | ICD-10-CM | POA: Diagnosis not present

## 2021-06-14 DIAGNOSIS — N390 Urinary tract infection, site not specified: Secondary | ICD-10-CM | POA: Diagnosis not present

## 2021-06-14 DIAGNOSIS — N35919 Unspecified urethral stricture, male, unspecified site: Secondary | ICD-10-CM | POA: Diagnosis not present

## 2021-06-14 DIAGNOSIS — E1165 Type 2 diabetes mellitus with hyperglycemia: Secondary | ICD-10-CM | POA: Diagnosis not present

## 2021-06-14 DIAGNOSIS — E785 Hyperlipidemia, unspecified: Secondary | ICD-10-CM | POA: Diagnosis not present

## 2021-06-14 DIAGNOSIS — E1122 Type 2 diabetes mellitus with diabetic chronic kidney disease: Secondary | ICD-10-CM | POA: Diagnosis not present

## 2021-06-14 DIAGNOSIS — R159 Full incontinence of feces: Secondary | ICD-10-CM | POA: Diagnosis not present

## 2021-06-14 DIAGNOSIS — R4189 Other symptoms and signs involving cognitive functions and awareness: Secondary | ICD-10-CM | POA: Diagnosis not present

## 2021-06-14 DIAGNOSIS — E1142 Type 2 diabetes mellitus with diabetic polyneuropathy: Secondary | ICD-10-CM | POA: Diagnosis not present

## 2021-06-14 DIAGNOSIS — Z8673 Personal history of transient ischemic attack (TIA), and cerebral infarction without residual deficits: Secondary | ICD-10-CM | POA: Diagnosis not present

## 2021-06-14 DIAGNOSIS — Z936 Other artificial openings of urinary tract status: Secondary | ICD-10-CM | POA: Diagnosis not present

## 2021-06-14 DIAGNOSIS — R531 Weakness: Secondary | ICD-10-CM | POA: Diagnosis not present

## 2021-06-14 DIAGNOSIS — I2510 Atherosclerotic heart disease of native coronary artery without angina pectoris: Secondary | ICD-10-CM | POA: Diagnosis not present

## 2021-06-14 DIAGNOSIS — Z8546 Personal history of malignant neoplasm of prostate: Secondary | ICD-10-CM | POA: Diagnosis not present

## 2021-06-14 DIAGNOSIS — I129 Hypertensive chronic kidney disease with stage 1 through stage 4 chronic kidney disease, or unspecified chronic kidney disease: Secondary | ICD-10-CM | POA: Diagnosis not present

## 2021-06-14 DIAGNOSIS — G4733 Obstructive sleep apnea (adult) (pediatric): Secondary | ICD-10-CM | POA: Diagnosis not present

## 2021-06-14 DIAGNOSIS — B3749 Other urogenital candidiasis: Secondary | ICD-10-CM | POA: Diagnosis not present

## 2021-06-15 ENCOUNTER — Telehealth: Payer: Self-pay | Admitting: Family Medicine

## 2021-06-15 ENCOUNTER — Other Ambulatory Visit: Payer: Self-pay

## 2021-06-15 ENCOUNTER — Ambulatory Visit: Payer: Medicare Other | Admitting: Family Medicine

## 2021-06-15 DIAGNOSIS — N179 Acute kidney failure, unspecified: Secondary | ICD-10-CM | POA: Diagnosis not present

## 2021-06-15 DIAGNOSIS — R6520 Severe sepsis without septic shock: Secondary | ICD-10-CM

## 2021-06-15 DIAGNOSIS — N184 Chronic kidney disease, stage 4 (severe): Secondary | ICD-10-CM

## 2021-06-15 DIAGNOSIS — A419 Sepsis, unspecified organism: Secondary | ICD-10-CM

## 2021-06-15 DIAGNOSIS — B377 Candidal sepsis: Secondary | ICD-10-CM

## 2021-06-15 LAB — ANTIFUNGAL AST 9 DRUG PANEL
Amphotericin B MIC: 1
Fluconazole Islt MIC: 0.5
Flucytosine MIC: 1
Itraconazole MIC: 0.12
Posaconazole MIC: 0.03
Source: 183119

## 2021-06-15 NOTE — Telephone Encounter (Signed)
Pt has home visit scheduled. Wife is unaware of what meds pt should be on.

## 2021-06-15 NOTE — Telephone Encounter (Signed)
Nurse's-patient recently discharged from the hospital.   Please call patient, let them know that we are aware that they were discharged from the hospital.  We need for patient to do standard hospital follow up visit.  Please schedule them to follow-up with [place] within the next 7 to 14 days. Advised the patient to bring all of their medications with him to the visit.   Review meds with patient does patient understand meds?  Is patient having any immediate needs or questions?  Should be noted that I spoke with the family on Sunday discussing their medications and seen if the patient had any needs and I set him up for a home visit for today  Nurses I also spoke with home health [facility] phone number [phone] Fax number [phone]  Nurses-please order CBC, metabolic 7, liver profile Diagnosis urosepsis, CKD stage IV  Home health will draw these labs. It would be fine to put on there our office contact information  Please fax the order to home health this afternoon thank you  (Side note information they do have hospice care available through this organization as well)

## 2021-06-15 NOTE — Progress Notes (Signed)
   Subjective:    Patient ID: Luke Solis, male    DOB: [birth year], 87 y.o.   MRN: [id number]  HPI Follow up home visit today. Wife Metta Clines is unsure of which meds he needs to be taking and would like dr to go over meds.   We had a good discussion today with the patient and his family regarding his hospitalization.  Also regarding his creatinine and also the need to do follow-up lab work.  In addition to this we went over his medications today.  Review of Systems     Objective:   Physical Exam  Lungs are clear heart is regular abdomen soft      Assessment & Plan:  Continue Diflucan Check lab work try to get it this week May need revision of his medications May continue amlodipine 5 mg daily for blood pressure We will utilize short acting insulin 5 units with each meal Long-acting insulin 16 units in the evening Freestyle libre will be prescribed Home health along with physical therapy occupational therapy recommended Hold off on hospice currently Did discuss in the wife issues briefly will do further discussion at another time patient too tired to cover all this today Hold off on metformin Reduce Lyrica to 25 mg in the morning, 25 mg 2 in the evening

## 2021-06-15 NOTE — Telephone Encounter (Signed)
Attempted to contact patient; no answer. Lab orders placed and mailed to [facility]

## 2021-06-16 ENCOUNTER — Telehealth: Payer: Self-pay | Admitting: Family Medicine

## 2021-06-16 LAB — CULTURE, BLOOD (ROUTINE X 2)
Culture: NO GROWTH
Culture: NO GROWTH
Special Requests: ADEQUATE
Special Requests: ADEQUATE

## 2021-06-16 MED ORDER — FREESTYLE LIBRE 14 DAY READER DEVI: 0 refills | Status: DC

## 2021-06-16 MED ORDER — FREESTYLE LIBRE 14 DAY SENSOR MISC
0 refills | Status: DC
Start: 2021-06-16 — End: 2021-07-01

## 2021-06-16 NOTE — Telephone Encounter (Signed)
Nurses  Yesterday I called his home health agency.  Asked them to do blood work within 1 week. I saw the patient yesterday.  I am concerned about his kidney function because it was significantly off when he left the hospital.  If any way possible please see if home health agency could draw blood potentially Thursday?  So we would have results before the weekend if possible  He already had a intake person see him over the weekend so they should be familiar with him  Home health agency [facility] [phone]  Also notify his pharmacy lanes in [place] that patient is willing to go ahead and start his short acting insulin which was ordered by the hospitalist is already on the med list they should have the order  Also place order for freestyle libre patient is on 4 shots of insulin per day due to type 2 diabetes insulin-dependent If I need to sign an order please have me do so thank you  Also informed them that we are stopping metformin at this time  Thank you

## 2021-06-16 NOTE — Telephone Encounter (Signed)
Spoke with Home health Nurse who was give the order to draw labs(CBC, Met 7 and Liver) on Thursday and call report to Dr Sallee Lange.    [place] states that the short acting insulin has already been picked up by the patient or family.  Freestyle libre Meter and sensor prescription sent electronically to pharmacy.

## 2021-06-18 ENCOUNTER — Telehealth: Payer: Self-pay | Admitting: Family Medicine

## 2021-06-18 ENCOUNTER — Other Ambulatory Visit: Payer: Self-pay | Admitting: *Deleted

## 2021-06-18 DIAGNOSIS — E1122 Type 2 diabetes mellitus with diabetic chronic kidney disease: Secondary | ICD-10-CM | POA: Diagnosis not present

## 2021-06-18 DIAGNOSIS — Z79899 Other long term (current) drug therapy: Secondary | ICD-10-CM | POA: Diagnosis not present

## 2021-06-18 DIAGNOSIS — N390 Urinary tract infection, site not specified: Secondary | ICD-10-CM | POA: Diagnosis not present

## 2021-06-18 DIAGNOSIS — B3749 Other urogenital candidiasis: Secondary | ICD-10-CM | POA: Diagnosis not present

## 2021-06-18 DIAGNOSIS — I129 Hypertensive chronic kidney disease with stage 1 through stage 4 chronic kidney disease, or unspecified chronic kidney disease: Secondary | ICD-10-CM | POA: Diagnosis not present

## 2021-06-18 DIAGNOSIS — R531 Weakness: Secondary | ICD-10-CM | POA: Diagnosis not present

## 2021-06-18 DIAGNOSIS — E1165 Type 2 diabetes mellitus with hyperglycemia: Secondary | ICD-10-CM | POA: Diagnosis not present

## 2021-06-18 NOTE — Telephone Encounter (Signed)
I had phone consultation with family a couple different times On Wednesday evening glucose was running in the 400s all the way up to low 500s We gave him 5 additional units of Humalog Also gave him 16 units of Levemir Numbers came down to 117 this morning was in the mid 200s at lunchtime Instructed family to utilize 3 units of insulin in the morning time if between 100-and 200 If above 200 utilize 5 units Hold if under 100 Continue Levemir 16 Give [place] update within a couple days Lab work ordered await results Family educated regarding how to monitor for low sugars

## 2021-06-19 ENCOUNTER — Other Ambulatory Visit: Payer: Self-pay | Admitting: Family Medicine

## 2021-06-19 DIAGNOSIS — E1165 Type 2 diabetes mellitus with hyperglycemia: Secondary | ICD-10-CM | POA: Diagnosis not present

## 2021-06-19 DIAGNOSIS — E1122 Type 2 diabetes mellitus with diabetic chronic kidney disease: Secondary | ICD-10-CM | POA: Diagnosis not present

## 2021-06-19 DIAGNOSIS — B3749 Other urogenital candidiasis: Secondary | ICD-10-CM | POA: Diagnosis not present

## 2021-06-19 DIAGNOSIS — N390 Urinary tract infection, site not specified: Secondary | ICD-10-CM | POA: Diagnosis not present

## 2021-06-19 DIAGNOSIS — I129 Hypertensive chronic kidney disease with stage 1 through stage 4 chronic kidney disease, or unspecified chronic kidney disease: Secondary | ICD-10-CM | POA: Diagnosis not present

## 2021-06-19 DIAGNOSIS — E876 Hypokalemia: Secondary | ICD-10-CM

## 2021-06-19 DIAGNOSIS — R531 Weakness: Secondary | ICD-10-CM | POA: Diagnosis not present

## 2021-06-19 LAB — CBC WITH DIFFERENTIAL/PLATELET
Basophils Absolute: 0 10*3/uL (ref 0.0–0.2)
Basos: 0 %
EOS (ABSOLUTE): 0.2 10*3/uL (ref 0.0–0.4)
Eos: 1 %
Hematocrit: 34.6 % — ABNORMAL LOW (ref 37.5–51.0)
Hemoglobin: 11.5 g/dL — ABNORMAL LOW (ref 13.0–17.7)
Immature Grans (Abs): 0.1 10*3/uL (ref 0.0–0.1)
Immature Granulocytes: 1 %
Lymphocytes Absolute: 1.6 10*3/uL (ref 0.7–3.1)
Lymphs: 12 %
MCH: 30.1 pg (ref 26.6–33.0)
MCHC: 33.2 g/dL (ref 31.5–35.7)
MCV: 91 fL (ref 79–97)
Monocytes Absolute: 0.7 10*3/uL (ref 0.1–0.9)
Monocytes: 5 %
Neutrophils Absolute: 11 10*3/uL — ABNORMAL HIGH (ref 1.4–7.0)
Neutrophils: 81 %
Platelets: 262 10*3/uL (ref 150–450)
RBC: 3.82 x10E6/uL — ABNORMAL LOW (ref 4.14–5.80)
RDW: 12.2 % (ref 11.6–15.4)
WBC: 13.5 10*3/uL — ABNORMAL HIGH (ref 3.4–10.8)

## 2021-06-19 LAB — BASIC METABOLIC PANEL
BUN/Creatinine Ratio: 16 (ref 10–24)
BUN: 24 mg/dL (ref 8–27)
CO2: 22 mmol/L (ref 20–29)
Calcium: 8.1 mg/dL — ABNORMAL LOW (ref 8.6–10.2)
Chloride: 97 mmol/L (ref 96–106)
Creatinine, Ser: 1.5 mg/dL — ABNORMAL HIGH (ref 0.76–1.27)
Glucose: 288 mg/dL — ABNORMAL HIGH (ref 65–99)
Potassium: 2.5 mmol/L — ABNORMAL LOW (ref 3.5–5.2)
Sodium: 139 mmol/L (ref 134–144)
eGFR: 45 mL/min/{1.73_m2} — ABNORMAL LOW (ref >59)

## 2021-06-19 LAB — HEPATIC FUNCTION PANEL
ALT: 40 IU/L (ref 0–44)
AST: 21 IU/L (ref 0–40)
Albumin: 3.1 g/dL — ABNORMAL LOW (ref 3.6–4.6)
Alkaline Phosphatase: 165 IU/L — ABNORMAL HIGH (ref 44–121)
Bilirubin Total: 0.2 mg/dL (ref 0.0–1.2)
Bilirubin, Direct: <0.1 mg/dL (ref 0.00–0.40)
Total Protein: 7 g/dL (ref 6.0–8.5)

## 2021-06-19 MED ORDER — POTASSIUM CHLORIDE CRYS ER 10 MEQ PO TBCR: EXTENDED_RELEASE_TABLET | ORAL | 0 refills | Status: DC

## 2021-06-19 NOTE — Progress Notes (Signed)
Error

## 2021-06-20 LAB — CULTURE, BLOOD (ROUTINE X 2)
Special Requests: ADEQUATE
Special Requests: ADEQUATE

## 2021-06-23 ENCOUNTER — Telehealth: Payer: Self-pay | Admitting: Family Medicine

## 2021-06-23 DIAGNOSIS — E1122 Type 2 diabetes mellitus with diabetic chronic kidney disease: Secondary | ICD-10-CM | POA: Diagnosis not present

## 2021-06-23 DIAGNOSIS — N390 Urinary tract infection, site not specified: Secondary | ICD-10-CM | POA: Diagnosis not present

## 2021-06-23 DIAGNOSIS — E1165 Type 2 diabetes mellitus with hyperglycemia: Secondary | ICD-10-CM | POA: Diagnosis not present

## 2021-06-23 DIAGNOSIS — B3749 Other urogenital candidiasis: Secondary | ICD-10-CM | POA: Diagnosis not present

## 2021-06-23 DIAGNOSIS — R531 Weakness: Secondary | ICD-10-CM | POA: Diagnosis not present

## 2021-06-23 DIAGNOSIS — I129 Hypertensive chronic kidney disease with stage 1 through stage 4 chronic kidney disease, or unspecified chronic kidney disease: Secondary | ICD-10-CM | POA: Diagnosis not present

## 2021-06-23 NOTE — Telephone Encounter (Signed)
Physical Therapist [place] [facility] calling and states that patient pulse is 38 and pt is acting lethargic. PT has been there about 10 min. Advised PT to have family call EMS to take him to ED for further evaluation.

## 2021-06-24 ENCOUNTER — Telehealth: Payer: Self-pay | Admitting: *Deleted

## 2021-06-24 DIAGNOSIS — E1122 Type 2 diabetes mellitus with diabetic chronic kidney disease: Secondary | ICD-10-CM | POA: Diagnosis not present

## 2021-06-24 DIAGNOSIS — I129 Hypertensive chronic kidney disease with stage 1 through stage 4 chronic kidney disease, or unspecified chronic kidney disease: Secondary | ICD-10-CM | POA: Diagnosis not present

## 2021-06-24 DIAGNOSIS — B3749 Other urogenital candidiasis: Secondary | ICD-10-CM | POA: Diagnosis not present

## 2021-06-24 DIAGNOSIS — N390 Urinary tract infection, site not specified: Secondary | ICD-10-CM | POA: Diagnosis not present

## 2021-06-24 DIAGNOSIS — R531 Weakness: Secondary | ICD-10-CM | POA: Diagnosis not present

## 2021-06-24 DIAGNOSIS — E1165 Type 2 diabetes mellitus with hyperglycemia: Secondary | ICD-10-CM | POA: Diagnosis not present

## 2021-06-24 NOTE — Telephone Encounter (Addendum)
Home Health nurse came by office and stated patient's pulse yesterday was 38 and today th highest pulse reading was 52. The family would like order to stop or hold Metoprolol. Consult with Jackalyn Lombard NP- STOP Metoprolol and call with pulse reading tomorrow. Written order given to home health nurse.

## 2021-06-25 ENCOUNTER — Telehealth: Payer: Self-pay | Admitting: Family Medicine

## 2021-06-25 DIAGNOSIS — R531 Weakness: Secondary | ICD-10-CM | POA: Diagnosis not present

## 2021-06-25 DIAGNOSIS — E1122 Type 2 diabetes mellitus with diabetic chronic kidney disease: Secondary | ICD-10-CM | POA: Diagnosis not present

## 2021-06-25 DIAGNOSIS — E1165 Type 2 diabetes mellitus with hyperglycemia: Secondary | ICD-10-CM | POA: Diagnosis not present

## 2021-06-25 DIAGNOSIS — I129 Hypertensive chronic kidney disease with stage 1 through stage 4 chronic kidney disease, or unspecified chronic kidney disease: Secondary | ICD-10-CM | POA: Diagnosis not present

## 2021-06-25 DIAGNOSIS — B3749 Other urogenital candidiasis: Secondary | ICD-10-CM | POA: Diagnosis not present

## 2021-06-25 DIAGNOSIS — N390 Urinary tract infection, site not specified: Secondary | ICD-10-CM | POA: Diagnosis not present

## 2021-06-25 NOTE — Telephone Encounter (Signed)
Nurses  Home health stated that they drew blood on the patient this week as an yesterday Apparently it was brought to [place]? I do not see any results at this point Please connect with home health for this patient see if they can locate the results of the metabolic 7 I do not plan on being on the computer tomorrow being that I am on vacation. If you get results then please work with Dr. Lovena Le (Specifically following up on kidney function and potassium) if you need my input you will need to text me

## 2021-06-26 ENCOUNTER — Other Ambulatory Visit: Payer: Self-pay | Admitting: *Deleted

## 2021-06-26 DIAGNOSIS — R531 Weakness: Secondary | ICD-10-CM | POA: Diagnosis not present

## 2021-06-26 DIAGNOSIS — I129 Hypertensive chronic kidney disease with stage 1 through stage 4 chronic kidney disease, or unspecified chronic kidney disease: Secondary | ICD-10-CM | POA: Diagnosis not present

## 2021-06-26 DIAGNOSIS — N390 Urinary tract infection, site not specified: Secondary | ICD-10-CM | POA: Diagnosis not present

## 2021-06-26 DIAGNOSIS — B3749 Other urogenital candidiasis: Secondary | ICD-10-CM | POA: Diagnosis not present

## 2021-06-26 DIAGNOSIS — E1122 Type 2 diabetes mellitus with diabetic chronic kidney disease: Secondary | ICD-10-CM | POA: Diagnosis not present

## 2021-06-26 DIAGNOSIS — E1165 Type 2 diabetes mellitus with hyperglycemia: Secondary | ICD-10-CM | POA: Diagnosis not present

## 2021-06-26 MED ORDER — GLOBAL EASE INJECT PEN NEEDLES 32G X 4 MM MISC: 99 refills | Status: DC

## 2021-06-26 NOTE — Telephone Encounter (Signed)
Spoke with [employer]: The blood work did not make it to the back for processing (lab error) they will upgrade to STAT and run the test for results this afternoon- they will call with results and fax results

## 2021-06-26 NOTE — Telephone Encounter (Signed)
[facility] called back and state they were having difficulty locating the patient's blood and would continue looking but the results will likely not be ready today as requested

## 2021-06-29 ENCOUNTER — Other Ambulatory Visit: Payer: Self-pay | Admitting: Family Medicine

## 2021-06-29 DIAGNOSIS — R531 Weakness: Secondary | ICD-10-CM | POA: Diagnosis not present

## 2021-06-29 DIAGNOSIS — E876 Hypokalemia: Secondary | ICD-10-CM | POA: Diagnosis not present

## 2021-06-29 DIAGNOSIS — E1165 Type 2 diabetes mellitus with hyperglycemia: Secondary | ICD-10-CM | POA: Diagnosis not present

## 2021-06-29 DIAGNOSIS — E1122 Type 2 diabetes mellitus with diabetic chronic kidney disease: Secondary | ICD-10-CM | POA: Diagnosis not present

## 2021-06-29 DIAGNOSIS — B3749 Other urogenital candidiasis: Secondary | ICD-10-CM | POA: Diagnosis not present

## 2021-06-29 DIAGNOSIS — N390 Urinary tract infection, site not specified: Secondary | ICD-10-CM | POA: Diagnosis not present

## 2021-06-29 DIAGNOSIS — I129 Hypertensive chronic kidney disease with stage 1 through stage 4 chronic kidney disease, or unspecified chronic kidney disease: Secondary | ICD-10-CM | POA: Diagnosis not present

## 2021-06-29 LAB — BASIC METABOLIC PANEL

## 2021-06-29 NOTE — Telephone Encounter (Signed)
Lab called and stated they found the specimen but it can not be processed and will need to be redrawn.  Spoke with Demetria at [facility] and they stated they would work on getting someone out today to get the lab redrawn.

## 2021-06-30 ENCOUNTER — Ambulatory Visit: Payer: Medicare Other | Admitting: Family Medicine

## 2021-06-30 VITALS — BP 124/70 | HR 64 | Wt 144.0 lb

## 2021-06-30 DIAGNOSIS — E1140 Type 2 diabetes mellitus with diabetic neuropathy, unspecified: Secondary | ICD-10-CM

## 2021-06-30 DIAGNOSIS — C61 Malignant neoplasm of prostate: Secondary | ICD-10-CM | POA: Diagnosis not present

## 2021-06-30 DIAGNOSIS — E1165 Type 2 diabetes mellitus with hyperglycemia: Secondary | ICD-10-CM

## 2021-06-30 DIAGNOSIS — N1832 Chronic kidney disease, stage 3b: Secondary | ICD-10-CM | POA: Diagnosis not present

## 2021-06-30 DIAGNOSIS — I10 Essential (primary) hypertension: Secondary | ICD-10-CM

## 2021-06-30 DIAGNOSIS — E1142 Type 2 diabetes mellitus with diabetic polyneuropathy: Secondary | ICD-10-CM

## 2021-06-30 DIAGNOSIS — E876 Hypokalemia: Secondary | ICD-10-CM | POA: Diagnosis not present

## 2021-06-30 LAB — BASIC METABOLIC PANEL
BUN/Creatinine Ratio: 13 (ref 10–24)
BUN: 22 mg/dL (ref 8–27)
CO2: 23 mmol/L (ref 20–29)
Calcium: 9 mg/dL (ref 8.6–10.2)
Chloride: 96 mmol/L (ref 96–106)
Creatinine, Ser: 1.73 mg/dL — ABNORMAL HIGH (ref 0.76–1.27)
Glucose: 373 mg/dL — ABNORMAL HIGH (ref 65–99)
Potassium: 4 mmol/L (ref 3.5–5.2)
Sodium: 138 mmol/L (ref 134–144)
eGFR: 38 mL/min/{1.73_m2} — ABNORMAL LOW (ref >59)

## 2021-06-30 NOTE — Telephone Encounter (Signed)
Home health recheck lab work this has been resolved

## 2021-06-30 NOTE — Progress Notes (Signed)
   Subjective:    Patient ID: Luke Solis, male    DOB: 09-26-1931, 87 y.o.   MRN: [id number]  HPI Pt here for follow up. Provider will be doing home visit with patient due to pt being home bound.   Home visit Patient in home because of recent UTI/sepsis.  Also type 2 diabetes on insulin Uncontrolled diabetes Indwelling catheter History of prostate cancer Also under the care of Dr. Rosana Hoes urology Recently had lab work completed  Results for orders placed or performed in visit on 17/51/02  Basic metabolic panel  Result Value Ref Range   Glucose 373 (H) 65 - 99 mg/dL   BUN 22 8 - 27 mg/dL   Creatinine, Ser 1.73 (H) 0.76 - 1.27 mg/dL   eGFR 38 (L) >59 mL/min/1.73   BUN/Creatinine Ratio 13 10 - 24   Sodium 138 134 - 144 mmol/L   Potassium 4.0 3.5 - 5.2 mmol/L   Chloride 96 96 - 106 mmol/L   CO2 23 20 - 29 mmol/L   Calcium 9.0 8.6 - 10.2 mg/dL    Review of Systems     Objective:   Physical Exam Lungs clear respiratory rate is normal blood pressure 124/70 Heart regular abdomen soft extremities no edema Urinary catheter in position clear appearance      Assessment & Plan:  1. Essential hypertension, benign Blood pressure decent control continue current measures hold off on metoprolol because patient does have some issues with bradycardia heart rate currently 64 off of metoprolol  2. Type 2 diabetes mellitus with diabetic neuropathy, without long-term current use of insulin (HCC) Poor control will need to increase long-acting insulin to 18 units Short acting insulin sliding scale as follows Under 100-no units 101-175-3 units 176-250-4 units 251-350-5 units [phone] units-call provider [phone] units-call provider Greater than 525 call provider  They were also instructed that if numbers are coming in above 350 to call provider.  3. Poorly controlled type 2 diabetes mellitus with peripheral neuropathy ([place]) We will hopefully get the sugars under better control family  will send [place] readings every 7 to 14 days Try to get freestyle libre covered by his insurance  4. Prostate cancer [facility]) Under the care of Dr. Rosana Hoes Will try to find out how often they recommend changing the catheter Difficult catheter changing would probably be best to be done at urology  5. Stage 3b chronic kidney disease (HCC) Creatinine stable.  Continue current measures stay away from metformin  6. Hypokalemia Reduce the potassium to 3 days/week we will recheck again the met 7 at the end in about 6 weeks  I have encouraged the family to learn how to do the insulin shots so that they can be cross trained

## 2021-07-01 ENCOUNTER — Other Ambulatory Visit: Payer: Self-pay

## 2021-07-01 DIAGNOSIS — E1165 Type 2 diabetes mellitus with hyperglycemia: Secondary | ICD-10-CM | POA: Diagnosis not present

## 2021-07-01 DIAGNOSIS — B3749 Other urogenital candidiasis: Secondary | ICD-10-CM | POA: Diagnosis not present

## 2021-07-01 DIAGNOSIS — I129 Hypertensive chronic kidney disease with stage 1 through stage 4 chronic kidney disease, or unspecified chronic kidney disease: Secondary | ICD-10-CM | POA: Diagnosis not present

## 2021-07-01 DIAGNOSIS — N390 Urinary tract infection, site not specified: Secondary | ICD-10-CM | POA: Diagnosis not present

## 2021-07-01 DIAGNOSIS — R531 Weakness: Secondary | ICD-10-CM | POA: Diagnosis not present

## 2021-07-01 DIAGNOSIS — E1122 Type 2 diabetes mellitus with diabetic chronic kidney disease: Secondary | ICD-10-CM | POA: Diagnosis not present

## 2021-07-01 MED ORDER — POTASSIUM CHLORIDE CRYS ER 10 MEQ PO TBCR: EXTENDED_RELEASE_TABLET | ORAL | 2 refills | Status: DC

## 2021-07-01 MED ORDER — FREESTYLE LIBRE 14 DAY SENSOR MISC: 12 refills | Status: DC

## 2021-07-01 MED ORDER — FREESTYLE LIBRE 14 DAY READER DEVI: 12 refills | Status: DC

## 2021-07-01 MED ORDER — INSULIN LISPRO (1 UNIT DIAL) 100 UNIT/ML (KWIKPEN): 5.0000 [IU] | PEN_INJECTOR | Freq: Three times a day (TID) | SUBCUTANEOUS | 0 refills | Status: DC

## 2021-07-01 NOTE — Progress Notes (Signed)
07/01/21- left message with Dr.Davis receptionist

## 2021-07-01 NOTE — Progress Notes (Signed)
07/01/21- spoke with [place]. They can take care of the [facility]. Please advise. Thank you Med dosage for Potassium changed and sliding scale incorporated in directions.

## 2021-07-01 NOTE — Addendum Note (Signed)
Addended by: Sallee Lange A on: 07/01/2021 04:58 PM   Modules accepted: Orders

## 2021-07-02 DIAGNOSIS — R531 Weakness: Secondary | ICD-10-CM | POA: Diagnosis not present

## 2021-07-02 DIAGNOSIS — E1165 Type 2 diabetes mellitus with hyperglycemia: Secondary | ICD-10-CM | POA: Diagnosis not present

## 2021-07-02 DIAGNOSIS — N390 Urinary tract infection, site not specified: Secondary | ICD-10-CM | POA: Diagnosis not present

## 2021-07-02 DIAGNOSIS — E1122 Type 2 diabetes mellitus with diabetic chronic kidney disease: Secondary | ICD-10-CM | POA: Diagnosis not present

## 2021-07-02 DIAGNOSIS — I129 Hypertensive chronic kidney disease with stage 1 through stage 4 chronic kidney disease, or unspecified chronic kidney disease: Secondary | ICD-10-CM | POA: Diagnosis not present

## 2021-07-02 DIAGNOSIS — B3749 Other urogenital candidiasis: Secondary | ICD-10-CM | POA: Diagnosis not present

## 2021-07-03 DIAGNOSIS — R531 Weakness: Secondary | ICD-10-CM | POA: Diagnosis not present

## 2021-07-03 DIAGNOSIS — B3749 Other urogenital candidiasis: Secondary | ICD-10-CM | POA: Diagnosis not present

## 2021-07-03 DIAGNOSIS — E1165 Type 2 diabetes mellitus with hyperglycemia: Secondary | ICD-10-CM | POA: Diagnosis not present

## 2021-07-03 DIAGNOSIS — N390 Urinary tract infection, site not specified: Secondary | ICD-10-CM | POA: Diagnosis not present

## 2021-07-03 DIAGNOSIS — I129 Hypertensive chronic kidney disease with stage 1 through stage 4 chronic kidney disease, or unspecified chronic kidney disease: Secondary | ICD-10-CM | POA: Diagnosis not present

## 2021-07-03 DIAGNOSIS — E1122 Type 2 diabetes mellitus with diabetic chronic kidney disease: Secondary | ICD-10-CM | POA: Diagnosis not present

## 2021-07-07 DIAGNOSIS — E1122 Type 2 diabetes mellitus with diabetic chronic kidney disease: Secondary | ICD-10-CM | POA: Diagnosis not present

## 2021-07-07 DIAGNOSIS — R531 Weakness: Secondary | ICD-10-CM | POA: Diagnosis not present

## 2021-07-07 DIAGNOSIS — E1165 Type 2 diabetes mellitus with hyperglycemia: Secondary | ICD-10-CM | POA: Diagnosis not present

## 2021-07-07 DIAGNOSIS — I129 Hypertensive chronic kidney disease with stage 1 through stage 4 chronic kidney disease, or unspecified chronic kidney disease: Secondary | ICD-10-CM | POA: Diagnosis not present

## 2021-07-07 DIAGNOSIS — N390 Urinary tract infection, site not specified: Secondary | ICD-10-CM | POA: Diagnosis not present

## 2021-07-07 DIAGNOSIS — B3749 Other urogenital candidiasis: Secondary | ICD-10-CM | POA: Diagnosis not present

## 2021-07-08 ENCOUNTER — Other Ambulatory Visit: Payer: Self-pay

## 2021-07-08 ENCOUNTER — Telehealth: Payer: Self-pay | Admitting: Family Medicine

## 2021-07-08 ENCOUNTER — Telehealth: Payer: Self-pay | Admitting: *Deleted

## 2021-07-08 ENCOUNTER — Other Ambulatory Visit: Payer: Self-pay | Admitting: Family Medicine

## 2021-07-08 ENCOUNTER — Other Ambulatory Visit: Payer: Self-pay | Admitting: *Deleted

## 2021-07-08 DIAGNOSIS — N390 Urinary tract infection, site not specified: Secondary | ICD-10-CM | POA: Diagnosis not present

## 2021-07-08 DIAGNOSIS — B3749 Other urogenital candidiasis: Secondary | ICD-10-CM | POA: Diagnosis not present

## 2021-07-08 DIAGNOSIS — E1122 Type 2 diabetes mellitus with diabetic chronic kidney disease: Secondary | ICD-10-CM | POA: Diagnosis not present

## 2021-07-08 DIAGNOSIS — I129 Hypertensive chronic kidney disease with stage 1 through stage 4 chronic kidney disease, or unspecified chronic kidney disease: Secondary | ICD-10-CM | POA: Diagnosis not present

## 2021-07-08 DIAGNOSIS — E1165 Type 2 diabetes mellitus with hyperglycemia: Secondary | ICD-10-CM | POA: Diagnosis not present

## 2021-07-08 DIAGNOSIS — R531 Weakness: Secondary | ICD-10-CM | POA: Diagnosis not present

## 2021-07-08 MED ORDER — INSULIN LISPRO (1 UNIT DIAL) 100 UNIT/ML (KWIKPEN): 5.0000 [IU] | PEN_INJECTOR | Freq: Three times a day (TID) | SUBCUTANEOUS | 0 refills | Status: DC

## 2021-07-08 MED ORDER — AMLODIPINE BESYLATE 5 MG PO TABS: 5.0000 mg | ORAL_TABLET | Freq: Every day | ORAL | 0 refills | Status: DC

## 2021-07-08 NOTE — Telephone Encounter (Signed)
Nurses  A prescription for hospital bed with rails will need to be sent to [facility]  Diagnosis urosepsis, muscle weakness Please fill out the prescription Please have Dr. Lovena Le sign Please send to [place] thank you

## 2021-07-08 NOTE — Telephone Encounter (Signed)
Left message to return call with Urology- Dr Shann Medal office concerning foley. Patient has appt with urology 07/30/21

## 2021-07-09 ENCOUNTER — Other Ambulatory Visit: Payer: Self-pay

## 2021-07-09 ENCOUNTER — Telehealth: Payer: Self-pay | Admitting: Family Medicine

## 2021-07-09 ENCOUNTER — Other Ambulatory Visit: Payer: Self-pay | Admitting: Family Medicine

## 2021-07-09 MED ORDER — GLOBAL EASE INJECT PEN NEEDLES 32G X 4 MM MISC: 99 refills | Status: DC

## 2021-07-09 NOTE — Telephone Encounter (Signed)
Script faxed to [facility]

## 2021-07-09 NOTE — Telephone Encounter (Signed)
[facility] requesting refill on Metoprolol 25 mg -take 1/2 tablet po BID. Not on current med list.  Also requesting refill on Pregabalin 25 mg capsule. Take one capsule po at bedtime prn (neuropathy pain) one in the morning 2 in the evening. Please advise. Thank you

## 2021-07-09 NOTE — Telephone Encounter (Signed)
Script written out and placed on Dr.Taylor door for signature

## 2021-07-09 NOTE — Telephone Encounter (Signed)
[facility] left voicemail on Dietitian. Returned call to Urology office but had to leave message on nurse line. Left detailed message on what information we are seeking.  [phone]

## 2021-07-10 ENCOUNTER — Telehealth: Payer: Self-pay | Admitting: Family Medicine

## 2021-07-10 ENCOUNTER — Other Ambulatory Visit: Payer: Self-pay | Admitting: Family Medicine

## 2021-07-10 DIAGNOSIS — B3749 Other urogenital candidiasis: Secondary | ICD-10-CM | POA: Diagnosis not present

## 2021-07-10 DIAGNOSIS — R531 Weakness: Secondary | ICD-10-CM | POA: Diagnosis not present

## 2021-07-10 DIAGNOSIS — N390 Urinary tract infection, site not specified: Secondary | ICD-10-CM | POA: Diagnosis not present

## 2021-07-10 DIAGNOSIS — I129 Hypertensive chronic kidney disease with stage 1 through stage 4 chronic kidney disease, or unspecified chronic kidney disease: Secondary | ICD-10-CM | POA: Diagnosis not present

## 2021-07-10 DIAGNOSIS — E1122 Type 2 diabetes mellitus with diabetic chronic kidney disease: Secondary | ICD-10-CM | POA: Diagnosis not present

## 2021-07-10 DIAGNOSIS — E1165 Type 2 diabetes mellitus with hyperglycemia: Secondary | ICD-10-CM | POA: Diagnosis not present

## 2021-07-10 MED ORDER — PREGABALIN 25 MG PO CAPS: 25.0000 mg | ORAL_CAPSULE | Freq: Every evening | ORAL | 5 refills | Status: DC | PRN

## 2021-07-10 NOTE — Telephone Encounter (Signed)
Pen needles 1 year  Please refuse the Lyrica and the metoprolol I already refilled the Lyrica the metoprolol has been discontinued

## 2021-07-10 NOTE — Telephone Encounter (Signed)
Family dropped off blood sugar for patient. Family states they also need scripts for pen needles, hospital bed and alcohol wipes. Pen needle and hospital bed scripts have been faxed to [facility]. Alcohol wipes written out on script pad and taped to provider door for signature.  Blood sugars in provider office for review. Please advise. Thank you

## 2021-07-10 NOTE — Telephone Encounter (Signed)
May have 6 refills on Lyrica(send through prescription request and I can sign off on this later today) Not to continue metoprolol

## 2021-07-10 NOTE — Telephone Encounter (Signed)
Dr.Taylor signed script and has been faxed to pharmacy

## 2021-07-10 NOTE — Telephone Encounter (Signed)
Lyrica pended via prescription request. Please advise. Thank you

## 2021-07-10 NOTE — Telephone Encounter (Signed)
Please have Dr. Lovena Le signed the prescription for the alcohol pads and fax it to the pharmacy thank you

## 2021-07-13 NOTE — Telephone Encounter (Signed)
FYI Pt had foley change on 07/10/21

## 2021-07-13 NOTE — Telephone Encounter (Signed)
Medication was refilled as requested  I reviewed over the glucose readings that were presented Keep all as is for now Schedule follow-up office visit toward the end of the month This can be in the office unless patient is unable to come then I could readjust and do it as a home visit

## 2021-07-13 NOTE — Telephone Encounter (Signed)
Telephone call no answer 

## 2021-07-14 DIAGNOSIS — R4189 Other symptoms and signs involving cognitive functions and awareness: Secondary | ICD-10-CM | POA: Diagnosis not present

## 2021-07-14 DIAGNOSIS — I129 Hypertensive chronic kidney disease with stage 1 through stage 4 chronic kidney disease, or unspecified chronic kidney disease: Secondary | ICD-10-CM | POA: Diagnosis not present

## 2021-07-14 DIAGNOSIS — R531 Weakness: Secondary | ICD-10-CM | POA: Diagnosis not present

## 2021-07-14 DIAGNOSIS — E1165 Type 2 diabetes mellitus with hyperglycemia: Secondary | ICD-10-CM | POA: Diagnosis not present

## 2021-07-14 DIAGNOSIS — N390 Urinary tract infection, site not specified: Secondary | ICD-10-CM | POA: Diagnosis not present

## 2021-07-14 DIAGNOSIS — G4733 Obstructive sleep apnea (adult) (pediatric): Secondary | ICD-10-CM | POA: Diagnosis not present

## 2021-07-14 DIAGNOSIS — I2510 Atherosclerotic heart disease of native coronary artery without angina pectoris: Secondary | ICD-10-CM | POA: Diagnosis not present

## 2021-07-14 DIAGNOSIS — E785 Hyperlipidemia, unspecified: Secondary | ICD-10-CM | POA: Diagnosis not present

## 2021-07-14 DIAGNOSIS — E1122 Type 2 diabetes mellitus with diabetic chronic kidney disease: Secondary | ICD-10-CM | POA: Diagnosis not present

## 2021-07-14 DIAGNOSIS — Z936 Other artificial openings of urinary tract status: Secondary | ICD-10-CM | POA: Diagnosis not present

## 2021-07-14 DIAGNOSIS — N35919 Unspecified urethral stricture, male, unspecified site: Secondary | ICD-10-CM | POA: Diagnosis not present

## 2021-07-14 DIAGNOSIS — E1142 Type 2 diabetes mellitus with diabetic polyneuropathy: Secondary | ICD-10-CM | POA: Diagnosis not present

## 2021-07-14 DIAGNOSIS — N1832 Chronic kidney disease, stage 3b: Secondary | ICD-10-CM | POA: Diagnosis not present

## 2021-07-14 DIAGNOSIS — Z794 Long term (current) use of insulin: Secondary | ICD-10-CM | POA: Diagnosis not present

## 2021-07-14 DIAGNOSIS — B3749 Other urogenital candidiasis: Secondary | ICD-10-CM | POA: Diagnosis not present

## 2021-07-14 DIAGNOSIS — R159 Full incontinence of feces: Secondary | ICD-10-CM | POA: Diagnosis not present

## 2021-07-14 DIAGNOSIS — Z8673 Personal history of transient ischemic attack (TIA), and cerebral infarction without residual deficits: Secondary | ICD-10-CM | POA: Diagnosis not present

## 2021-07-14 DIAGNOSIS — Z8546 Personal history of malignant neoplasm of prostate: Secondary | ICD-10-CM | POA: Diagnosis not present

## 2021-07-15 DIAGNOSIS — E1122 Type 2 diabetes mellitus with diabetic chronic kidney disease: Secondary | ICD-10-CM | POA: Diagnosis not present

## 2021-07-15 DIAGNOSIS — E1165 Type 2 diabetes mellitus with hyperglycemia: Secondary | ICD-10-CM | POA: Diagnosis not present

## 2021-07-15 DIAGNOSIS — N390 Urinary tract infection, site not specified: Secondary | ICD-10-CM | POA: Diagnosis not present

## 2021-07-15 DIAGNOSIS — R531 Weakness: Secondary | ICD-10-CM | POA: Diagnosis not present

## 2021-07-15 DIAGNOSIS — I129 Hypertensive chronic kidney disease with stage 1 through stage 4 chronic kidney disease, or unspecified chronic kidney disease: Secondary | ICD-10-CM | POA: Diagnosis not present

## 2021-07-15 DIAGNOSIS — B3749 Other urogenital candidiasis: Secondary | ICD-10-CM | POA: Diagnosis not present

## 2021-07-15 NOTE — Telephone Encounter (Signed)
Pt wife contacted and verbalized understanding. Pt wife would like to try to bring patient in. Scheduled for 07/29/21 at 11 am

## 2021-07-16 DIAGNOSIS — E1165 Type 2 diabetes mellitus with hyperglycemia: Secondary | ICD-10-CM | POA: Diagnosis not present

## 2021-07-16 DIAGNOSIS — R531 Weakness: Secondary | ICD-10-CM | POA: Diagnosis not present

## 2021-07-16 DIAGNOSIS — I129 Hypertensive chronic kidney disease with stage 1 through stage 4 chronic kidney disease, or unspecified chronic kidney disease: Secondary | ICD-10-CM | POA: Diagnosis not present

## 2021-07-16 DIAGNOSIS — E1122 Type 2 diabetes mellitus with diabetic chronic kidney disease: Secondary | ICD-10-CM | POA: Diagnosis not present

## 2021-07-16 DIAGNOSIS — N390 Urinary tract infection, site not specified: Secondary | ICD-10-CM | POA: Diagnosis not present

## 2021-07-16 DIAGNOSIS — B3749 Other urogenital candidiasis: Secondary | ICD-10-CM | POA: Diagnosis not present

## 2021-07-17 DIAGNOSIS — I129 Hypertensive chronic kidney disease with stage 1 through stage 4 chronic kidney disease, or unspecified chronic kidney disease: Secondary | ICD-10-CM | POA: Diagnosis not present

## 2021-07-17 DIAGNOSIS — E1165 Type 2 diabetes mellitus with hyperglycemia: Secondary | ICD-10-CM | POA: Diagnosis not present

## 2021-07-17 DIAGNOSIS — N390 Urinary tract infection, site not specified: Secondary | ICD-10-CM | POA: Diagnosis not present

## 2021-07-17 DIAGNOSIS — E1122 Type 2 diabetes mellitus with diabetic chronic kidney disease: Secondary | ICD-10-CM | POA: Diagnosis not present

## 2021-07-17 DIAGNOSIS — B3749 Other urogenital candidiasis: Secondary | ICD-10-CM | POA: Diagnosis not present

## 2021-07-17 DIAGNOSIS — R531 Weakness: Secondary | ICD-10-CM | POA: Diagnosis not present

## 2021-07-20 DIAGNOSIS — I129 Hypertensive chronic kidney disease with stage 1 through stage 4 chronic kidney disease, or unspecified chronic kidney disease: Secondary | ICD-10-CM | POA: Diagnosis not present

## 2021-07-20 DIAGNOSIS — E1122 Type 2 diabetes mellitus with diabetic chronic kidney disease: Secondary | ICD-10-CM | POA: Diagnosis not present

## 2021-07-20 DIAGNOSIS — N390 Urinary tract infection, site not specified: Secondary | ICD-10-CM | POA: Diagnosis not present

## 2021-07-20 DIAGNOSIS — B3749 Other urogenital candidiasis: Secondary | ICD-10-CM | POA: Diagnosis not present

## 2021-07-20 DIAGNOSIS — R531 Weakness: Secondary | ICD-10-CM | POA: Diagnosis not present

## 2021-07-20 DIAGNOSIS — E1165 Type 2 diabetes mellitus with hyperglycemia: Secondary | ICD-10-CM | POA: Diagnosis not present

## 2021-07-21 DIAGNOSIS — R531 Weakness: Secondary | ICD-10-CM | POA: Diagnosis not present

## 2021-07-21 DIAGNOSIS — E1122 Type 2 diabetes mellitus with diabetic chronic kidney disease: Secondary | ICD-10-CM | POA: Diagnosis not present

## 2021-07-21 DIAGNOSIS — I129 Hypertensive chronic kidney disease with stage 1 through stage 4 chronic kidney disease, or unspecified chronic kidney disease: Secondary | ICD-10-CM | POA: Diagnosis not present

## 2021-07-21 DIAGNOSIS — N390 Urinary tract infection, site not specified: Secondary | ICD-10-CM | POA: Diagnosis not present

## 2021-07-21 DIAGNOSIS — B3749 Other urogenital candidiasis: Secondary | ICD-10-CM | POA: Diagnosis not present

## 2021-07-21 DIAGNOSIS — E1165 Type 2 diabetes mellitus with hyperglycemia: Secondary | ICD-10-CM | POA: Diagnosis not present

## 2021-07-22 DIAGNOSIS — R531 Weakness: Secondary | ICD-10-CM | POA: Diagnosis not present

## 2021-07-22 DIAGNOSIS — B3749 Other urogenital candidiasis: Secondary | ICD-10-CM | POA: Diagnosis not present

## 2021-07-22 DIAGNOSIS — N390 Urinary tract infection, site not specified: Secondary | ICD-10-CM | POA: Diagnosis not present

## 2021-07-22 DIAGNOSIS — I129 Hypertensive chronic kidney disease with stage 1 through stage 4 chronic kidney disease, or unspecified chronic kidney disease: Secondary | ICD-10-CM | POA: Diagnosis not present

## 2021-07-22 DIAGNOSIS — E1122 Type 2 diabetes mellitus with diabetic chronic kidney disease: Secondary | ICD-10-CM | POA: Diagnosis not present

## 2021-07-22 DIAGNOSIS — E1165 Type 2 diabetes mellitus with hyperglycemia: Secondary | ICD-10-CM | POA: Diagnosis not present

## 2021-07-25 DIAGNOSIS — R531 Weakness: Secondary | ICD-10-CM | POA: Diagnosis not present

## 2021-07-25 DIAGNOSIS — T839XXA Unspecified complication of genitourinary prosthetic device, implant and graft, initial encounter: Secondary | ICD-10-CM | POA: Diagnosis not present

## 2021-07-25 DIAGNOSIS — E1122 Type 2 diabetes mellitus with diabetic chronic kidney disease: Secondary | ICD-10-CM | POA: Diagnosis not present

## 2021-07-25 DIAGNOSIS — R339 Retention of urine, unspecified: Secondary | ICD-10-CM | POA: Diagnosis not present

## 2021-07-25 DIAGNOSIS — I129 Hypertensive chronic kidney disease with stage 1 through stage 4 chronic kidney disease, or unspecified chronic kidney disease: Secondary | ICD-10-CM | POA: Diagnosis not present

## 2021-07-25 DIAGNOSIS — Y998 Other external cause status: Secondary | ICD-10-CM | POA: Diagnosis not present

## 2021-07-25 DIAGNOSIS — B3749 Other urogenital candidiasis: Secondary | ICD-10-CM | POA: Diagnosis not present

## 2021-07-25 DIAGNOSIS — N390 Urinary tract infection, site not specified: Secondary | ICD-10-CM | POA: Diagnosis not present

## 2021-07-25 DIAGNOSIS — E1165 Type 2 diabetes mellitus with hyperglycemia: Secondary | ICD-10-CM | POA: Diagnosis not present

## 2021-07-25 DIAGNOSIS — X58XXXA Exposure to other specified factors, initial encounter: Secondary | ICD-10-CM | POA: Diagnosis not present

## 2021-07-26 DIAGNOSIS — T83098A Other mechanical complication of other indwelling urethral catheter, initial encounter: Secondary | ICD-10-CM | POA: Diagnosis not present

## 2021-07-27 DIAGNOSIS — N390 Urinary tract infection, site not specified: Secondary | ICD-10-CM | POA: Diagnosis not present

## 2021-07-27 DIAGNOSIS — E1122 Type 2 diabetes mellitus with diabetic chronic kidney disease: Secondary | ICD-10-CM | POA: Diagnosis not present

## 2021-07-27 DIAGNOSIS — I129 Hypertensive chronic kidney disease with stage 1 through stage 4 chronic kidney disease, or unspecified chronic kidney disease: Secondary | ICD-10-CM | POA: Diagnosis not present

## 2021-07-27 DIAGNOSIS — R531 Weakness: Secondary | ICD-10-CM | POA: Diagnosis not present

## 2021-07-27 DIAGNOSIS — B3749 Other urogenital candidiasis: Secondary | ICD-10-CM | POA: Diagnosis not present

## 2021-07-27 DIAGNOSIS — E1165 Type 2 diabetes mellitus with hyperglycemia: Secondary | ICD-10-CM | POA: Diagnosis not present

## 2021-07-28 DIAGNOSIS — E1165 Type 2 diabetes mellitus with hyperglycemia: Secondary | ICD-10-CM | POA: Diagnosis not present

## 2021-07-28 DIAGNOSIS — R531 Weakness: Secondary | ICD-10-CM | POA: Diagnosis not present

## 2021-07-28 DIAGNOSIS — B3749 Other urogenital candidiasis: Secondary | ICD-10-CM | POA: Diagnosis not present

## 2021-07-28 DIAGNOSIS — M79672 Pain in left foot: Secondary | ICD-10-CM | POA: Diagnosis not present

## 2021-07-28 DIAGNOSIS — N390 Urinary tract infection, site not specified: Secondary | ICD-10-CM | POA: Diagnosis not present

## 2021-07-28 DIAGNOSIS — E1140 Type 2 diabetes mellitus with diabetic neuropathy, unspecified: Secondary | ICD-10-CM | POA: Diagnosis not present

## 2021-07-28 DIAGNOSIS — I129 Hypertensive chronic kidney disease with stage 1 through stage 4 chronic kidney disease, or unspecified chronic kidney disease: Secondary | ICD-10-CM | POA: Diagnosis not present

## 2021-07-28 DIAGNOSIS — M79671 Pain in right foot: Secondary | ICD-10-CM | POA: Diagnosis not present

## 2021-07-28 DIAGNOSIS — E1122 Type 2 diabetes mellitus with diabetic chronic kidney disease: Secondary | ICD-10-CM | POA: Diagnosis not present

## 2021-07-28 DIAGNOSIS — L609 Nail disorder, unspecified: Secondary | ICD-10-CM | POA: Diagnosis not present

## 2021-07-29 ENCOUNTER — Ambulatory Visit (INDEPENDENT_AMBULATORY_CARE_PROVIDER_SITE_OTHER): Payer: Medicare Other | Admitting: Family Medicine

## 2021-07-29 ENCOUNTER — Other Ambulatory Visit: Payer: Self-pay

## 2021-07-29 VITALS — BP 126/68 | Wt 153.0 lb

## 2021-07-29 DIAGNOSIS — E1142 Type 2 diabetes mellitus with diabetic polyneuropathy: Secondary | ICD-10-CM | POA: Diagnosis not present

## 2021-07-29 DIAGNOSIS — N1832 Chronic kidney disease, stage 3b: Secondary | ICD-10-CM

## 2021-07-29 DIAGNOSIS — E782 Mixed hyperlipidemia: Secondary | ICD-10-CM

## 2021-07-29 DIAGNOSIS — I10 Essential (primary) hypertension: Secondary | ICD-10-CM | POA: Diagnosis not present

## 2021-07-29 DIAGNOSIS — E1165 Type 2 diabetes mellitus with hyperglycemia: Secondary | ICD-10-CM

## 2021-07-29 MED ORDER — LANTUS SOLOSTAR 100 UNIT/ML ~~LOC~~ SOPN: 20.0000 [IU] | PEN_INJECTOR | Freq: Every day | SUBCUTANEOUS | 2 refills | Status: DC

## 2021-07-29 NOTE — Progress Notes (Signed)
   Subjective:    Patient ID: Luke Solis, male    DOB: 06-10-1931, 87 y.o.   MRN: [id number]  HPI  Patient arrives for a follow up. Patient was seen in the ER over the weekend for UTI and foley issues and is currently on antibiotic. Patient has bladder outlet obstruction related to his prostate cancer.  Under the care of Dr. Rosana Hoes urology Has an appointment coming up with him in mid September Has had a recent hospitalization for urosepsis and still working hard trying to get back from that [place] with a lot of weakness and unsteadiness but this is improving Strength is improving to some degree with physical therapy he should continue to physical therapy as long as he is making progress  Therapy 2 times a week Diabetes subpar control The patient was seen today as part of a comprehensive diabetic check up. Patient has diabetes  Compliance- fairly good compliance but the dietary effort could be better Low sugars- he has not had any low sugars his overall readings look good but his morning sugars are slightly higher than what I like to see Dietary effort- could be doing better takes in too much carbohydrates Foot exam and ophthalmology exam requirements were reviewed  Patient for blood pressure check up.  The patient does have hypertension.    Medication compliance- overall good compliance  Blood pressure control recently- blood pressure control is under good control  Dietary compliance- denies any dietary issues regarding blood pressure   Review of Systems     Objective:   Physical Exam  Lungs are clear heart regular pulse normal extremities no edema skin warm dry      Assessment & Plan:   1. Essential hypertension, benign  blood pressure decent control continue current measures  2. Poorly controlled type 2 diabetes mellitus with peripheral neuropathy (HCC)  check A1c in October bump up long-acting insulin to 20 units continue on sliding scale do better job of cutting  back on starches - Hemoglobin W0J - Basic metabolic panel  3. Stage 3b chronic kidney disease ([place])  check labs in October continue current medications - Basic metabolic panel  4. Mixed hyperlipidemia  watch diet closely continue current medication Currently due to his kidney function he is only on 25 mg Lyrica each evening we may be able to bump up the dose that if his upcoming kidney function look good   follow-up in October

## 2021-07-30 DIAGNOSIS — E1165 Type 2 diabetes mellitus with hyperglycemia: Secondary | ICD-10-CM | POA: Diagnosis not present

## 2021-07-30 DIAGNOSIS — N390 Urinary tract infection, site not specified: Secondary | ICD-10-CM | POA: Diagnosis not present

## 2021-07-30 DIAGNOSIS — R531 Weakness: Secondary | ICD-10-CM | POA: Diagnosis not present

## 2021-07-30 DIAGNOSIS — E1122 Type 2 diabetes mellitus with diabetic chronic kidney disease: Secondary | ICD-10-CM | POA: Diagnosis not present

## 2021-07-30 DIAGNOSIS — B3749 Other urogenital candidiasis: Secondary | ICD-10-CM | POA: Diagnosis not present

## 2021-07-30 DIAGNOSIS — I129 Hypertensive chronic kidney disease with stage 1 through stage 4 chronic kidney disease, or unspecified chronic kidney disease: Secondary | ICD-10-CM | POA: Diagnosis not present

## 2021-07-31 ENCOUNTER — Other Ambulatory Visit: Payer: Self-pay | Admitting: Family Medicine

## 2021-08-02 ENCOUNTER — Other Ambulatory Visit: Payer: Self-pay | Admitting: Family Medicine

## 2021-08-03 DIAGNOSIS — E1122 Type 2 diabetes mellitus with diabetic chronic kidney disease: Secondary | ICD-10-CM | POA: Diagnosis not present

## 2021-08-03 DIAGNOSIS — R531 Weakness: Secondary | ICD-10-CM | POA: Diagnosis not present

## 2021-08-03 DIAGNOSIS — E1165 Type 2 diabetes mellitus with hyperglycemia: Secondary | ICD-10-CM | POA: Diagnosis not present

## 2021-08-03 DIAGNOSIS — N390 Urinary tract infection, site not specified: Secondary | ICD-10-CM | POA: Diagnosis not present

## 2021-08-03 DIAGNOSIS — B3749 Other urogenital candidiasis: Secondary | ICD-10-CM | POA: Diagnosis not present

## 2021-08-03 DIAGNOSIS — I129 Hypertensive chronic kidney disease with stage 1 through stage 4 chronic kidney disease, or unspecified chronic kidney disease: Secondary | ICD-10-CM | POA: Diagnosis not present

## 2021-08-05 DIAGNOSIS — E1122 Type 2 diabetes mellitus with diabetic chronic kidney disease: Secondary | ICD-10-CM | POA: Diagnosis not present

## 2021-08-05 DIAGNOSIS — R531 Weakness: Secondary | ICD-10-CM | POA: Diagnosis not present

## 2021-08-05 DIAGNOSIS — B3749 Other urogenital candidiasis: Secondary | ICD-10-CM | POA: Diagnosis not present

## 2021-08-05 DIAGNOSIS — I129 Hypertensive chronic kidney disease with stage 1 through stage 4 chronic kidney disease, or unspecified chronic kidney disease: Secondary | ICD-10-CM | POA: Diagnosis not present

## 2021-08-05 DIAGNOSIS — N390 Urinary tract infection, site not specified: Secondary | ICD-10-CM | POA: Diagnosis not present

## 2021-08-05 DIAGNOSIS — E1165 Type 2 diabetes mellitus with hyperglycemia: Secondary | ICD-10-CM | POA: Diagnosis not present

## 2021-08-06 DIAGNOSIS — R339 Retention of urine, unspecified: Secondary | ICD-10-CM | POA: Diagnosis not present

## 2021-08-07 ENCOUNTER — Telehealth: Payer: Self-pay | Admitting: Family Medicine

## 2021-08-07 ENCOUNTER — Ambulatory Visit (INDEPENDENT_AMBULATORY_CARE_PROVIDER_SITE_OTHER): Payer: Medicare Other | Admitting: Family Medicine

## 2021-08-07 ENCOUNTER — Other Ambulatory Visit: Payer: Self-pay

## 2021-08-07 DIAGNOSIS — N320 Bladder-neck obstruction: Secondary | ICD-10-CM

## 2021-08-07 NOTE — Telephone Encounter (Signed)
Mr. shimon, [place] are scheduled for a virtual visit with your provider today.    Just as we do with appointments in the office, we must obtain your consent to participate.  Your consent will be active for this visit and any virtual visit you may have with one of our providers in the next 365 days.    If you have a MyChart account, I can also send a copy of this consent to you electronically.  All virtual visits are billed to your insurance company just like a traditional visit in the office.  As this is a virtual visit, video technology does not allow for your provider to perform a traditional examination.  This may limit your provider's ability to fully assess your condition.  If your provider identifies any concerns that need to be evaluated in person or the need to arrange testing such as labs, EKG, etc, we will make arrangements to do so.    Although advances in technology are sophisticated, we cannot ensure that it will always work on either your end or our end.  If the connection with a video visit is poor, we may have to switch to a telephone visit.  With either a video or telephone visit, we are not always able to ensure that we have a secure connection.   I need to obtain your verbal consent now.   Are you willing to proceed with your visit today?   ACELIN FERDIG has provided verbal consent on 08/07/2021 for a virtual visit (video or telephone).   Vicente Males, LPN 03/12/3084  69:43 AM

## 2021-08-07 NOTE — Progress Notes (Addendum)
   Subjective:    Patient ID: Luke Solis, male    DOB: [birth year], 87 y.o.   MRN: [id number]  HPI Pt wife would like to discuss procedure that patient urologist is suggesting. Urology believes pt would benefit from suprapubic tube. Pt wife states that would like to discuss this with Dr.Marliyah Reid because they are just now getting back on feet with everything else that has been going on. No date for procedure at this time.  Virtual Visit via Telephone Note  I connected with [name] on 08/07/21 at 11:00 AM EDT by telephone and verified that I am speaking with the correct person using two identifiers.  Location: Patient: home Provider: office   I discussed the limitations, risks, security and privacy concerns of performing an evaluation and management service by telephone and the availability of in person appointments. I also discussed with the patient that there may be a patient responsible charge related to this service. The patient expressed understanding and agreed to proceed.   History of Present Illness:    Observations/Objective:   Assessment and Plan:   Follow Up Instructions:    I discussed the assessment and treatment plan with the patient. The patient was provided an opportunity to ask questions and all were answered. The patient agreed with the plan and demonstrated an understanding of the instructions.   The patient was advised to call back or seek an in-person evaluation if the symptoms worsen or if the condition fails to improve as anticipated.  I provided  20 minutes of non-face-to-face time during this encounter.      Review of Systems     Objective:   Physical Exam    Today's visit was via telephone Physical exam was not possible for this visit     Assessment & Plan:   Bladder outlet obstruction Urology proposing suprapubic catheter This seems to be a reasonable step Family is going to consider it and think about it before they commit Patient  will do regular follow-up this fall

## 2021-08-11 DIAGNOSIS — E1165 Type 2 diabetes mellitus with hyperglycemia: Secondary | ICD-10-CM | POA: Diagnosis not present

## 2021-08-11 DIAGNOSIS — N390 Urinary tract infection, site not specified: Secondary | ICD-10-CM | POA: Diagnosis not present

## 2021-08-11 DIAGNOSIS — R531 Weakness: Secondary | ICD-10-CM | POA: Diagnosis not present

## 2021-08-11 DIAGNOSIS — B3749 Other urogenital candidiasis: Secondary | ICD-10-CM | POA: Diagnosis not present

## 2021-08-11 DIAGNOSIS — I129 Hypertensive chronic kidney disease with stage 1 through stage 4 chronic kidney disease, or unspecified chronic kidney disease: Secondary | ICD-10-CM | POA: Diagnosis not present

## 2021-08-11 DIAGNOSIS — E1122 Type 2 diabetes mellitus with diabetic chronic kidney disease: Secondary | ICD-10-CM | POA: Diagnosis not present

## 2021-08-12 DIAGNOSIS — N390 Urinary tract infection, site not specified: Secondary | ICD-10-CM | POA: Diagnosis not present

## 2021-08-12 DIAGNOSIS — B3749 Other urogenital candidiasis: Secondary | ICD-10-CM | POA: Diagnosis not present

## 2021-08-12 DIAGNOSIS — I129 Hypertensive chronic kidney disease with stage 1 through stage 4 chronic kidney disease, or unspecified chronic kidney disease: Secondary | ICD-10-CM | POA: Diagnosis not present

## 2021-08-12 DIAGNOSIS — R339 Retention of urine, unspecified: Secondary | ICD-10-CM | POA: Insufficient documentation

## 2021-08-12 DIAGNOSIS — E1165 Type 2 diabetes mellitus with hyperglycemia: Secondary | ICD-10-CM | POA: Diagnosis not present

## 2021-08-12 DIAGNOSIS — E1122 Type 2 diabetes mellitus with diabetic chronic kidney disease: Secondary | ICD-10-CM | POA: Diagnosis not present

## 2021-08-12 DIAGNOSIS — R531 Weakness: Secondary | ICD-10-CM | POA: Diagnosis not present

## 2021-08-13 ENCOUNTER — Other Ambulatory Visit: Payer: Self-pay

## 2021-08-13 ENCOUNTER — Ambulatory Visit (INDEPENDENT_AMBULATORY_CARE_PROVIDER_SITE_OTHER): Payer: Medicare Other | Admitting: Cardiology

## 2021-08-13 ENCOUNTER — Encounter: Payer: Self-pay | Admitting: Cardiology

## 2021-08-13 VITALS — BP 118/72 | HR 69 | Ht 74.0 in | Wt 154.0 lb

## 2021-08-13 DIAGNOSIS — E1142 Type 2 diabetes mellitus with diabetic polyneuropathy: Secondary | ICD-10-CM | POA: Diagnosis not present

## 2021-08-13 DIAGNOSIS — N35919 Unspecified urethral stricture, male, unspecified site: Secondary | ICD-10-CM | POA: Diagnosis not present

## 2021-08-13 DIAGNOSIS — E782 Mixed hyperlipidemia: Secondary | ICD-10-CM | POA: Diagnosis not present

## 2021-08-13 DIAGNOSIS — I2510 Atherosclerotic heart disease of native coronary artery without angina pectoris: Secondary | ICD-10-CM | POA: Diagnosis not present

## 2021-08-13 DIAGNOSIS — I10 Essential (primary) hypertension: Secondary | ICD-10-CM | POA: Diagnosis not present

## 2021-08-13 DIAGNOSIS — E1165 Type 2 diabetes mellitus with hyperglycemia: Secondary | ICD-10-CM | POA: Diagnosis not present

## 2021-08-13 DIAGNOSIS — Z466 Encounter for fitting and adjustment of urinary device: Secondary | ICD-10-CM | POA: Diagnosis not present

## 2021-08-13 DIAGNOSIS — N1832 Chronic kidney disease, stage 3b: Secondary | ICD-10-CM | POA: Diagnosis not present

## 2021-08-13 DIAGNOSIS — R4189 Other symptoms and signs involving cognitive functions and awareness: Secondary | ICD-10-CM | POA: Diagnosis not present

## 2021-08-13 DIAGNOSIS — I25119 Atherosclerotic heart disease of native coronary artery with unspecified angina pectoris: Secondary | ICD-10-CM

## 2021-08-13 DIAGNOSIS — Z8673 Personal history of transient ischemic attack (TIA), and cerebral infarction without residual deficits: Secondary | ICD-10-CM | POA: Diagnosis not present

## 2021-08-13 DIAGNOSIS — Z794 Long term (current) use of insulin: Secondary | ICD-10-CM | POA: Diagnosis not present

## 2021-08-13 DIAGNOSIS — Z8546 Personal history of malignant neoplasm of prostate: Secondary | ICD-10-CM | POA: Diagnosis not present

## 2021-08-13 DIAGNOSIS — G4733 Obstructive sleep apnea (adult) (pediatric): Secondary | ICD-10-CM | POA: Diagnosis not present

## 2021-08-13 DIAGNOSIS — E1122 Type 2 diabetes mellitus with diabetic chronic kidney disease: Secondary | ICD-10-CM | POA: Diagnosis not present

## 2021-08-13 DIAGNOSIS — I129 Hypertensive chronic kidney disease with stage 1 through stage 4 chronic kidney disease, or unspecified chronic kidney disease: Secondary | ICD-10-CM | POA: Diagnosis not present

## 2021-08-13 DIAGNOSIS — E785 Hyperlipidemia, unspecified: Secondary | ICD-10-CM | POA: Diagnosis not present

## 2021-08-13 NOTE — Progress Notes (Signed)
Cardiology Office Note   Date:  08/13/2021   ID:  Luke Solis, DOB [birth year], MRN [id number]  PCP:  Kathyrn Drown, MD  Cardiologist:  Dr. Domenic Polite    Chief Complaint  Patient presents with   Coronary Artery Disease      History of Present Illness: Luke Solis is a 87 y.o. male who presents for CAD.  Hx CAD with BMS to RCA and LCX has been stable.  Mixed HLD on crestor, HTN . Lipids reviewed in chart with LDL 101, Tchol 168, HDL 49 03/2021  A1c at that time 10.9 more recently 9.0   Hgb 07/2021 11.4, plts 255 Cr 1.69 BUN 26  Reading chart, pt has urinary retention due to Bladder outlet obstruction.  Plans for suprapubic cath.  He has hx of prostate cancer.  Has had permanent foley cath now with issues in draining. Marland Kitchen  Hx of hospitalization in 06/2021 UTI and Sepsis/SIRS  Blood culture collected on 7/4 grew C. Albicans and echo done and was stable. Thought to TEE to rule out endocarditis but pt and family refused.   Pt discharged with hospice.   But he has improved so much no longer with hospice.  PT and OT come to his home. He denies any chest pain or SOB   he feels well. No fevers or any issues.   EKG during hospitalization stable. At this point no need for TEE.     Past Medical History:  Diagnosis Date   Coronary atherosclerosis of native coronary artery    BMS proximal and mid RCA as well as BMS circumflex - 2002 (had residual 70% distal LAD), LVEF 60%   Diabetes mellitus without complication (HCC)    Essential hypertension    Hyperlipidemia    Neuropathy    NSTEMI (non-ST elevated myocardial infarction) ([place])    2002   Prostate cancer ([place])    Sleep apnea    On CPAP   Stroke [facility])     Past Surgical History:  Procedure Laterality Date   COLONOSCOPY     INGUINAL HERNIA REPAIR     KNEE SURGERY     NOSE SURGERY     PROSTATE SURGERY     TONSILLECTOMY       Current Outpatient Medications  Medication Sig Dispense Refill   amLODipine (NORVASC) 5 MG tablet Take 1 tablet  (5 mg total) by mouth daily. 90 tablet 0   Calcium Carbonate-Vitamin D 600-400 MG-UNIT per tablet Take 1 tablet by mouth daily.     Continuous Blood Gluc Receiver (FREESTYLE LIBRE 14 DAY READER) DEVI On insulin shots 4 times a day Dx E11.9 2 each 12   Continuous Blood Gluc Sensor (FREESTYLE LIBRE 14 DAY SENSOR) MISC On insulin 4 times daily type 2 dm on insulin 2 each 12   fexofenadine (ALLEGRA) 180 MG tablet Take 1 tablet (180 mg total) by mouth daily as needed for allergies.     FORACARE PREMIUM V10 TEST test strip USE AS DIRECTED. 50 each 0   GLOBAL EASE INJECT PEN NEEDLES 32G X 4 MM MISC USE TO INJECT LANTUS EVERY EVENING AND HUMALOG THREE (3) TIMES DAILY. 120 each 0   insulin glargine (LANTUS SOLOSTAR) 100 UNIT/ML Solostar Pen Inject 20 Units into the skin at bedtime. 15 mL 2   insulin lispro (HUMALOG KWIKPEN) 100 UNIT/ML KwikPen Inject 5 Units into the skin 3 (three) times daily with meals. Under 100-no units 101-175-3 units 176-250-4 units 251-350-5 units [phone] units-call provider [phone]  units-call provider Greater than 525 call providerGive only if eats 50% or more of the meal. 15 mL 0   nitroGLYCERIN (NITROSTAT) 0.4 MG SL tablet Place 1 tablet (0.4 mg total) under the tongue every 5 (five) minutes as needed for chest pain.     potassium chloride (KLOR-CON) 10 MEQ tablet Take one tablet po Monday, Wednesday and Friday 15 tablet 2   pregabalin (LYRICA) 25 MG capsule Take 1 capsule (25 mg total) by mouth at bedtime as needed (neuropathy pain). 1 in the morning 2 in the evening 30 capsule 5   rosuvastatin (CRESTOR) 5 MG tablet TAKE 1 TABLET ONCE DAILY. 90 tablet 0   ketoconazole (NIZORAL) 2 % cream APPLY THIN AMOUNT TWICE DAILY TO INFLAMMED AREA OF GENITALS. (Patient not taking: Reported on 08/13/2021) 30 g 0   Multiple Vitamin (MULTIVITAMIN) tablet Take 1 tablet by mouth daily. (Patient not taking: Reported on 08/13/2021)     No current facility-administered medications for this visit.     Allergies:   Broccoli [brassica oleracea] and Celery oil    Social History:  The patient  reports that he has never smoked. He has never used smokeless tobacco. He reports that he does not drink alcohol and does not use drugs.   Family History:  The patient's family history includes Coronary artery disease in his father; Diabetes in his mother; Heart attack in his father and mother; Hypertension in his father; Stroke in his maternal grandfather.    ROS:  General:no colds or fevers, no weight changes Skin:no rashes or ulcers HEENT:no blurred vision, no congestion CV:see HPI PUL:see HPI GI:no diarrhea constipation or melena, no indigestion GU:no hematuria, no dysuria MS:no joint pain, no claudication Neuro:no syncope, no lightheadedness Endo:+ diabetes, wife states it has been controlled, no thyroid disease  Wt Readings from Last 3 Encounters:  08/13/21 154 lb (69.9 kg)  07/29/21 153 lb (69.4 kg)  06/30/21 144 lb (65.3 kg)     PHYSICAL EXAM: VS:  BP 118/72   Pulse 69   Ht 6\' 2"  (1.88 m)   Wt 154 lb (69.9 kg)   SpO2 98%   BMI 19.77 kg/m  , BMI Body mass index is 19.77 kg/m. General:Pleasant affect, NAD Skin:Warm and dry, brisk capillary refill HEENT:normocephalic, sclera clear, mucus membranes moist Neck:supple, no JVD, no bruits  Heart:S1S2 RRR with soft sytolic murmur, no gallup, rub or click Lungs:clear without rales, rhonchi, or wheezes IHK:VQQV, non tender, + BS, do not palpate liver spleen or masses Ext:no lower ext edema, 2+ pedal pulses, 2+ radial pulses Neuro:alert and oriented X 3, MAE, follows commands, + facial symmetry    EKG:  EKG is NOT ordered today.   Recent Labs: 10/20/2020: TSH 1.740 06/12/2021: Magnesium 2.1 06/18/2021: ALT 40; Hemoglobin 11.5; Platelets 262 06/29/2021: BUN 22; Creatinine, Ser 1.73; Potassium 4.0; Sodium 138    Lipid Panel    Component Value Date/Time   CHOL 168 03/25/2021 0825   TRIG 98 03/25/2021 0825   HDL 49  03/25/2021 0825   CHOLHDL 3.4 03/25/2021 0825   CHOLHDL 2.8 03/02/2018 0543   VLDL 13 03/02/2018 0543   LDLCALC 101 (H) 03/25/2021 0825       Other studies Reviewed: Additional studies/ records that were reviewed today include: Marland Kitchen Exercise Cardiolite 05/22/2015: No diagnostic ST segment changes, fixed small defect of mild intensity in the basal inferoseptal and mid to basal inferior wall with LVEF greater than 65%. Overall low risk. Patient did achieve 85% MPHR.   Echocardiogram 03/01/2018:  Study Conclusions   - Left ventricle: The cavity size was normal. Wall thickness was   increased in a pattern of moderate LVH. Systolic function was   normal. The estimated ejection fraction was in the range of 60%   to 65%. Wall motion was normal; there were no regional wall   motion abnormalities. Doppler parameters are consistent with   abnormal left ventricular relaxation (grade 1 diastolic   dysfunction). - Aortic valve: Mildly calcified annulus. Trileaflet; mildly   thickened leaflets. Valve area (VTI): 2.98 cm^2. Valve area   (Vmax): 3.12 cm^2. Valve area (Vmean): 2.63 cm^2. - Mitral valve: Mildly calcified annulus. Mildly thickened leaflets   Cardiac monitor 03/15/2018: Available strips from 14-day event recorder reviewed.  Limited strips were presented, both showing sinus rhythm.  No obvious arrhythmias were reported, no pauses.  Heart rate ranged from 56 bpm up to 107 bpm with average heart rate 75 bpm.   Echo 06/12/2019 IMPRESSIONS     1. Left ventricular ejection fraction, by estimation, is 70 to 75%. The  left ventricle has hyperdynamic function. The left ventricle has no  regional wall motion abnormalities. There is mild left ventricular  hypertrophy. Left ventricular diastolic  parameters are indeterminate.   2. Right ventricular systolic function is normal. The right ventricular  size is normal.   3. The mitral valve is normal in structure. Mild mitral valve  regurgitation.    4. The aortic valve is tricuspid. Aortic valve regurgitation is not  visualized. Mild to moderate aortic valve sclerosis/calcification is  present, without any evidence of aortic stenosis.   FINDINGS   Left Ventricle: Left ventricular ejection fraction, by estimation, is 70  to 75%. The left ventricle has hyperdynamic function. The left ventricle  has no regional wall motion abnormalities. The left ventricular internal  cavity size was normal in size.  There is mild left ventricular hypertrophy. Left ventricular diastolic  parameters are indeterminate.   Right Ventricle: The right ventricular size is normal. Right vetricular  wall thickness was not assessed. Right ventricular systolic function is  normal.   Left Atrium: Left atrial size was normal in size.   Right Atrium: Right atrial size was normal in size.   Pericardium: There is no evidence of pericardial effusion.   Mitral Valve: The mitral valve is normal in structure. Mild mitral valve  regurgitation. MV peak gradient, 4.9 mmHg. The mean mitral valve gradient  is 2.0 mmHg.   Tricuspid Valve: The tricuspid valve is normal in structure. Tricuspid  valve regurgitation is trivial.   Aortic Valve: The aortic valve is tricuspid. Aortic valve regurgitation is  not visualized. Mild to moderate aortic valve sclerosis/calcification is  present, without any evidence of aortic stenosis. Aortic valve mean  gradient measures 4.0 mmHg. Aortic  valve peak gradient measures 8.6 mmHg.   Pulmonic Valve: The pulmonic valve was grossly normal. Pulmonic valve  regurgitation is not visualized.   Aorta: The aortic root is normal in size and structure.   IAS/Shunts: No atrial level shunt detected by color flow Doppler.   ASSESSMENT AND PLAN:  1.  CAD with hx BMS to RCA and LCX with mod LAD disease.  On medical therapy and no angina.  EKG in July with sepsis stable.  Stable echo with no vegetation in July.  I reviewed records from  hospitaliztion.   2.  Mixed HLD with elevated LDL but with recent illness no med changes currently.   3.  HTN well controlled. No changes.  4.  Urinary retention with foley but leaking. Possible supra pubic cath in future, pt is thinking about it.  Per urology  Follow up in 6 months with Dr. Domenic Polite     Current medicines are reviewed with the patient today.  The patient Has no concerns regarding medicines.  The following changes have been made:  See above Labs/ tests ordered today include:see above  Disposition:   FU:  see above  Signed, Cecilie Kicks, NP  08/13/2021 3:17 PM    [address], [address] [place], [place] Phone: [phone]; Fax: [phone]

## 2021-08-13 NOTE — Patient Instructions (Addendum)

## 2021-08-17 DIAGNOSIS — Z9189 Other specified personal risk factors, not elsewhere classified: Secondary | ICD-10-CM | POA: Diagnosis not present

## 2021-08-17 DIAGNOSIS — R54 Age-related physical debility: Secondary | ICD-10-CM | POA: Diagnosis not present

## 2021-08-18 DIAGNOSIS — Z466 Encounter for fitting and adjustment of urinary device: Secondary | ICD-10-CM | POA: Diagnosis not present

## 2021-08-18 DIAGNOSIS — E1165 Type 2 diabetes mellitus with hyperglycemia: Secondary | ICD-10-CM | POA: Diagnosis not present

## 2021-08-18 DIAGNOSIS — E1122 Type 2 diabetes mellitus with diabetic chronic kidney disease: Secondary | ICD-10-CM | POA: Diagnosis not present

## 2021-08-18 DIAGNOSIS — E1142 Type 2 diabetes mellitus with diabetic polyneuropathy: Secondary | ICD-10-CM | POA: Diagnosis not present

## 2021-08-18 DIAGNOSIS — I129 Hypertensive chronic kidney disease with stage 1 through stage 4 chronic kidney disease, or unspecified chronic kidney disease: Secondary | ICD-10-CM | POA: Diagnosis not present

## 2021-08-18 DIAGNOSIS — N35919 Unspecified urethral stricture, male, unspecified site: Secondary | ICD-10-CM | POA: Diagnosis not present

## 2021-08-20 DIAGNOSIS — E1142 Type 2 diabetes mellitus with diabetic polyneuropathy: Secondary | ICD-10-CM | POA: Diagnosis not present

## 2021-08-20 DIAGNOSIS — Z466 Encounter for fitting and adjustment of urinary device: Secondary | ICD-10-CM | POA: Diagnosis not present

## 2021-08-20 DIAGNOSIS — E1122 Type 2 diabetes mellitus with diabetic chronic kidney disease: Secondary | ICD-10-CM | POA: Diagnosis not present

## 2021-08-20 DIAGNOSIS — I129 Hypertensive chronic kidney disease with stage 1 through stage 4 chronic kidney disease, or unspecified chronic kidney disease: Secondary | ICD-10-CM | POA: Diagnosis not present

## 2021-08-20 DIAGNOSIS — N35919 Unspecified urethral stricture, male, unspecified site: Secondary | ICD-10-CM | POA: Diagnosis not present

## 2021-08-20 DIAGNOSIS — E1165 Type 2 diabetes mellitus with hyperglycemia: Secondary | ICD-10-CM | POA: Diagnosis not present

## 2021-08-23 DIAGNOSIS — E1142 Type 2 diabetes mellitus with diabetic polyneuropathy: Secondary | ICD-10-CM | POA: Diagnosis not present

## 2021-08-23 DIAGNOSIS — N35919 Unspecified urethral stricture, male, unspecified site: Secondary | ICD-10-CM | POA: Diagnosis not present

## 2021-08-23 DIAGNOSIS — I129 Hypertensive chronic kidney disease with stage 1 through stage 4 chronic kidney disease, or unspecified chronic kidney disease: Secondary | ICD-10-CM | POA: Diagnosis not present

## 2021-08-23 DIAGNOSIS — Z466 Encounter for fitting and adjustment of urinary device: Secondary | ICD-10-CM | POA: Diagnosis not present

## 2021-08-23 DIAGNOSIS — E1122 Type 2 diabetes mellitus with diabetic chronic kidney disease: Secondary | ICD-10-CM | POA: Diagnosis not present

## 2021-08-23 DIAGNOSIS — E1165 Type 2 diabetes mellitus with hyperglycemia: Secondary | ICD-10-CM | POA: Diagnosis not present

## 2021-08-25 DIAGNOSIS — E1165 Type 2 diabetes mellitus with hyperglycemia: Secondary | ICD-10-CM | POA: Diagnosis not present

## 2021-08-25 DIAGNOSIS — E1142 Type 2 diabetes mellitus with diabetic polyneuropathy: Secondary | ICD-10-CM | POA: Diagnosis not present

## 2021-08-25 DIAGNOSIS — Z466 Encounter for fitting and adjustment of urinary device: Secondary | ICD-10-CM | POA: Diagnosis not present

## 2021-08-25 DIAGNOSIS — N35919 Unspecified urethral stricture, male, unspecified site: Secondary | ICD-10-CM | POA: Diagnosis not present

## 2021-08-25 DIAGNOSIS — E1122 Type 2 diabetes mellitus with diabetic chronic kidney disease: Secondary | ICD-10-CM | POA: Diagnosis not present

## 2021-08-25 DIAGNOSIS — I129 Hypertensive chronic kidney disease with stage 1 through stage 4 chronic kidney disease, or unspecified chronic kidney disease: Secondary | ICD-10-CM | POA: Diagnosis not present

## 2021-08-26 ENCOUNTER — Ambulatory Visit (INDEPENDENT_AMBULATORY_CARE_PROVIDER_SITE_OTHER): Payer: Medicare Other | Admitting: Family Medicine

## 2021-08-26 ENCOUNTER — Other Ambulatory Visit: Payer: Self-pay

## 2021-08-26 ENCOUNTER — Encounter: Payer: Self-pay | Admitting: Family Medicine

## 2021-08-26 VITALS — BP 134/74 | HR 70 | Temp 97.3°F | Wt 157.6 lb

## 2021-08-26 DIAGNOSIS — Z9989 Dependence on other enabling machines and devices: Secondary | ICD-10-CM | POA: Diagnosis not present

## 2021-08-26 DIAGNOSIS — G4733 Obstructive sleep apnea (adult) (pediatric): Secondary | ICD-10-CM

## 2021-08-26 DIAGNOSIS — R5383 Other fatigue: Secondary | ICD-10-CM

## 2021-08-26 DIAGNOSIS — I10 Essential (primary) hypertension: Secondary | ICD-10-CM | POA: Diagnosis not present

## 2021-08-26 DIAGNOSIS — E1165 Type 2 diabetes mellitus with hyperglycemia: Secondary | ICD-10-CM

## 2021-08-26 DIAGNOSIS — R531 Weakness: Secondary | ICD-10-CM

## 2021-08-26 DIAGNOSIS — I25119 Atherosclerotic heart disease of native coronary artery with unspecified angina pectoris: Secondary | ICD-10-CM | POA: Diagnosis not present

## 2021-08-26 DIAGNOSIS — Z23 Encounter for immunization: Secondary | ICD-10-CM | POA: Diagnosis not present

## 2021-08-26 MED ORDER — HYDROCODONE-ACETAMINOPHEN 5-325 MG PO TABS: 1.0000 | ORAL_TABLET | Freq: Four times a day (QID) | ORAL | 0 refills | Status: AC | PRN

## 2021-08-26 NOTE — Progress Notes (Signed)
   Subjective:    Patient ID: Luke Solis, male    DOB: [birth year], 87 y.o.   MRN: [id number]  HPI Pt having supapubic catheter placed at [facility] on Tuesday. Pt would like to possibly get pain med for when he comes home. Pt will be kept overnight afterwards. Last couple of days pt has been tired and not feeling to good.  Weakness - Plan: CBC with Differential, Basic Metabolic Panel (BMET), Hepatic function panel  Hyperglycemia due to diabetes mellitus ([place]) - Plan: CBC with Differential, Basic Metabolic Panel (BMET), Hepatic function panel  Other fatigue - Plan: CBC with Differential, Basic Metabolic Panel (BMET), Hepatic function panel  Essential hypertension, benign - Plan: CBC with Differential, Basic Metabolic Panel (BMET), Hepatic function panel  Need for vaccination - Plan: Flu Vaccine QUAD High Dose(Fluad)  OSA on CPAP  Patient with previous urosepsis had a indwelling Foley catheter placed while he was in the hospital this is been present since then now has a scheduled procedure for a suprapubic cath coming up  Patient relates feeling fatigued rundown sugars in the morning are typically in the 120-140 range but later in the day 2 50-300 range tries to eat relatively healthy Does not tolerate CPAP machine currently because of everything going on  Review of Systems     Objective:   Physical Exam  Lungs are clear heart regular pulse normal weight steady extremities no edema skin warm dry neurologic grossly normal      Assessment & Plan:  Weakness - Plan: CBC with Differential, Basic Metabolic Panel (BMET), Hepatic function panel  Hyperglycemia due to diabetes mellitus ([place]) - Plan: CBC with Differential, Basic Metabolic Panel (BMET), Hepatic function panel  Other fatigue - Plan: CBC with Differential, Basic Metabolic Panel (BMET), Hepatic function panel  Essential hypertension, benign - Plan: CBC with Differential, Basic Metabolic Panel (BMET), Hepatic function  panel  Need for vaccination - Plan: Flu Vaccine QUAD High Dose(Fluad)  OSA on CPAPmed  1. Weakness Fatigue and weakness could be related to lack of use of CPAP as well as deconditioning but we will check lab work to make sure there is not underlying issue - CBC with Differential - Basic Metabolic Panel (BMET) - Hepatic function panel  2. Hyperglycemia due to diabetes mellitus ([place]) Important for patient try to eat as healthy as possible utilize insulin to keep numbers down check A1c again in October - CBC with Differential - Basic Metabolic Panel (BMET) - Hepatic function panel  3. Other fatigue Blood pressure decent control check lab work to make sure no anemia - CBC with Differential - Basic Metabolic Panel (BMET) - Hepatic function panel  4. Essential hypertension, benign Continue current medication blood pressure decent control - CBC with Differential - Basic Metabolic Panel (BMET) - Hepatic function panel  5. Need for vaccination Flu shot today - Flu Vaccine QUAD High Dose(Fluad)  6. OSA on CPAP Once patient gets through suprapubic placement very important for the patient to go ahead with CPAP.

## 2021-08-27 LAB — CBC WITH DIFFERENTIAL/PLATELET
Basophils Absolute: 0 10*3/uL (ref 0.0–0.2)
Basos: 1 %
EOS (ABSOLUTE): 0.3 10*3/uL (ref 0.0–0.4)
Eos: 4 %
Hematocrit: 37 % — ABNORMAL LOW (ref 37.5–51.0)
Hemoglobin: 12.2 g/dL — ABNORMAL LOW (ref 13.0–17.7)
Immature Grans (Abs): 0 10*3/uL (ref 0.0–0.1)
Immature Granulocytes: 1 %
Lymphocytes Absolute: 2.7 10*3/uL (ref 0.7–3.1)
Lymphs: 37 %
MCH: 30.1 pg (ref 26.6–33.0)
MCHC: 33 g/dL (ref 31.5–35.7)
MCV: 91 fL (ref 79–97)
Monocytes Absolute: 0.5 10*3/uL (ref 0.1–0.9)
Monocytes: 6 %
Neutrophils Absolute: 3.7 10*3/uL (ref 1.4–7.0)
Neutrophils: 51 %
Platelets: 267 10*3/uL (ref 150–450)
RBC: 4.05 x10E6/uL — ABNORMAL LOW (ref 4.14–5.80)
RDW: 12.8 % (ref 11.6–15.4)
WBC: 7.2 10*3/uL (ref 3.4–10.8)

## 2021-08-27 LAB — HEPATIC FUNCTION PANEL
ALT: 8 IU/L (ref 0–44)
AST: 12 IU/L (ref 0–40)
Albumin: 4 g/dL (ref 3.6–4.6)
Alkaline Phosphatase: 74 IU/L (ref 44–121)
Bilirubin Total: <0.2 mg/dL (ref 0.0–1.2)
Bilirubin, Direct: <0.1 mg/dL (ref 0.00–0.40)
Total Protein: 7.4 g/dL (ref 6.0–8.5)

## 2021-08-27 LAB — BASIC METABOLIC PANEL
BUN/Creatinine Ratio: 17 (ref 10–24)
BUN: 24 mg/dL (ref 8–27)
CO2: 23 mmol/L (ref 20–29)
Calcium: 9 mg/dL (ref 8.6–10.2)
Chloride: 100 mmol/L (ref 96–106)
Creatinine, Ser: 1.39 mg/dL — ABNORMAL HIGH (ref 0.76–1.27)
Glucose: 296 mg/dL — ABNORMAL HIGH (ref 65–99)
Potassium: 3.9 mmol/L (ref 3.5–5.2)
Sodium: 138 mmol/L (ref 134–144)
eGFR: 49 mL/min/{1.73_m2} — ABNORMAL LOW (ref >59)

## 2021-08-28 DIAGNOSIS — N35919 Unspecified urethral stricture, male, unspecified site: Secondary | ICD-10-CM | POA: Diagnosis not present

## 2021-08-28 DIAGNOSIS — E1122 Type 2 diabetes mellitus with diabetic chronic kidney disease: Secondary | ICD-10-CM | POA: Diagnosis not present

## 2021-08-28 DIAGNOSIS — I129 Hypertensive chronic kidney disease with stage 1 through stage 4 chronic kidney disease, or unspecified chronic kidney disease: Secondary | ICD-10-CM | POA: Diagnosis not present

## 2021-08-28 DIAGNOSIS — Z466 Encounter for fitting and adjustment of urinary device: Secondary | ICD-10-CM | POA: Diagnosis not present

## 2021-08-28 DIAGNOSIS — E1142 Type 2 diabetes mellitus with diabetic polyneuropathy: Secondary | ICD-10-CM | POA: Diagnosis not present

## 2021-08-28 DIAGNOSIS — E1165 Type 2 diabetes mellitus with hyperglycemia: Secondary | ICD-10-CM | POA: Diagnosis not present

## 2021-09-01 DIAGNOSIS — I10 Essential (primary) hypertension: Secondary | ICD-10-CM | POA: Diagnosis not present

## 2021-09-01 DIAGNOSIS — R338 Other retention of urine: Secondary | ICD-10-CM | POA: Diagnosis not present

## 2021-09-01 DIAGNOSIS — N35014 Post-traumatic urethral stricture, male, unspecified: Secondary | ICD-10-CM | POA: Diagnosis not present

## 2021-09-01 DIAGNOSIS — Z9889 Other specified postprocedural states: Secondary | ICD-10-CM | POA: Diagnosis not present

## 2021-09-01 DIAGNOSIS — Z8546 Personal history of malignant neoplasm of prostate: Secondary | ICD-10-CM | POA: Diagnosis not present

## 2021-09-01 DIAGNOSIS — Z79899 Other long term (current) drug therapy: Secondary | ICD-10-CM | POA: Diagnosis not present

## 2021-09-01 DIAGNOSIS — I2510 Atherosclerotic heart disease of native coronary artery without angina pectoris: Secondary | ICD-10-CM | POA: Diagnosis not present

## 2021-09-01 DIAGNOSIS — N35919 Unspecified urethral stricture, male, unspecified site: Secondary | ICD-10-CM | POA: Diagnosis not present

## 2021-09-01 DIAGNOSIS — I252 Old myocardial infarction: Secondary | ICD-10-CM | POA: Diagnosis not present

## 2021-09-01 DIAGNOSIS — R339 Retention of urine, unspecified: Secondary | ICD-10-CM | POA: Diagnosis not present

## 2021-09-01 DIAGNOSIS — E785 Hyperlipidemia, unspecified: Secondary | ICD-10-CM | POA: Diagnosis not present

## 2021-09-01 DIAGNOSIS — N99114 Postprocedural urethral stricture, male, unspecified: Secondary | ICD-10-CM | POA: Diagnosis not present

## 2021-09-01 DIAGNOSIS — C61 Malignant neoplasm of prostate: Secondary | ICD-10-CM | POA: Diagnosis not present

## 2021-09-04 ENCOUNTER — Other Ambulatory Visit: Payer: Self-pay | Admitting: Family Medicine

## 2021-09-07 DIAGNOSIS — I129 Hypertensive chronic kidney disease with stage 1 through stage 4 chronic kidney disease, or unspecified chronic kidney disease: Secondary | ICD-10-CM | POA: Diagnosis not present

## 2021-09-07 DIAGNOSIS — N35919 Unspecified urethral stricture, male, unspecified site: Secondary | ICD-10-CM | POA: Diagnosis not present

## 2021-09-07 DIAGNOSIS — E1165 Type 2 diabetes mellitus with hyperglycemia: Secondary | ICD-10-CM | POA: Diagnosis not present

## 2021-09-07 DIAGNOSIS — Z466 Encounter for fitting and adjustment of urinary device: Secondary | ICD-10-CM | POA: Diagnosis not present

## 2021-09-07 DIAGNOSIS — E1142 Type 2 diabetes mellitus with diabetic polyneuropathy: Secondary | ICD-10-CM | POA: Diagnosis not present

## 2021-09-07 DIAGNOSIS — E1122 Type 2 diabetes mellitus with diabetic chronic kidney disease: Secondary | ICD-10-CM | POA: Diagnosis not present

## 2021-09-08 DIAGNOSIS — Z466 Encounter for fitting and adjustment of urinary device: Secondary | ICD-10-CM | POA: Diagnosis not present

## 2021-09-08 DIAGNOSIS — I129 Hypertensive chronic kidney disease with stage 1 through stage 4 chronic kidney disease, or unspecified chronic kidney disease: Secondary | ICD-10-CM | POA: Diagnosis not present

## 2021-09-08 DIAGNOSIS — E1142 Type 2 diabetes mellitus with diabetic polyneuropathy: Secondary | ICD-10-CM | POA: Diagnosis not present

## 2021-09-08 DIAGNOSIS — N35919 Unspecified urethral stricture, male, unspecified site: Secondary | ICD-10-CM | POA: Diagnosis not present

## 2021-09-08 DIAGNOSIS — E1165 Type 2 diabetes mellitus with hyperglycemia: Secondary | ICD-10-CM | POA: Diagnosis not present

## 2021-09-08 DIAGNOSIS — E1122 Type 2 diabetes mellitus with diabetic chronic kidney disease: Secondary | ICD-10-CM | POA: Diagnosis not present

## 2021-09-12 DIAGNOSIS — N35919 Unspecified urethral stricture, male, unspecified site: Secondary | ICD-10-CM | POA: Diagnosis not present

## 2021-09-12 DIAGNOSIS — E1165 Type 2 diabetes mellitus with hyperglycemia: Secondary | ICD-10-CM | POA: Diagnosis not present

## 2021-09-12 DIAGNOSIS — E1142 Type 2 diabetes mellitus with diabetic polyneuropathy: Secondary | ICD-10-CM | POA: Diagnosis not present

## 2021-09-12 DIAGNOSIS — E1122 Type 2 diabetes mellitus with diabetic chronic kidney disease: Secondary | ICD-10-CM | POA: Diagnosis not present

## 2021-09-12 DIAGNOSIS — Z466 Encounter for fitting and adjustment of urinary device: Secondary | ICD-10-CM | POA: Diagnosis not present

## 2021-09-12 DIAGNOSIS — E785 Hyperlipidemia, unspecified: Secondary | ICD-10-CM | POA: Diagnosis not present

## 2021-09-12 DIAGNOSIS — Z794 Long term (current) use of insulin: Secondary | ICD-10-CM | POA: Diagnosis not present

## 2021-09-12 DIAGNOSIS — I129 Hypertensive chronic kidney disease with stage 1 through stage 4 chronic kidney disease, or unspecified chronic kidney disease: Secondary | ICD-10-CM | POA: Diagnosis not present

## 2021-09-12 DIAGNOSIS — Z8546 Personal history of malignant neoplasm of prostate: Secondary | ICD-10-CM | POA: Diagnosis not present

## 2021-09-12 DIAGNOSIS — R4189 Other symptoms and signs involving cognitive functions and awareness: Secondary | ICD-10-CM | POA: Diagnosis not present

## 2021-09-12 DIAGNOSIS — Z8673 Personal history of transient ischemic attack (TIA), and cerebral infarction without residual deficits: Secondary | ICD-10-CM | POA: Diagnosis not present

## 2021-09-12 DIAGNOSIS — G4733 Obstructive sleep apnea (adult) (pediatric): Secondary | ICD-10-CM | POA: Diagnosis not present

## 2021-09-12 DIAGNOSIS — N1832 Chronic kidney disease, stage 3b: Secondary | ICD-10-CM | POA: Diagnosis not present

## 2021-09-12 DIAGNOSIS — I2510 Atherosclerotic heart disease of native coronary artery without angina pectoris: Secondary | ICD-10-CM | POA: Diagnosis not present

## 2021-09-14 ENCOUNTER — Telehealth: Payer: Self-pay | Admitting: *Deleted

## 2021-09-14 NOTE — Chronic Care Management (AMB) (Signed)
  Chronic Care Management   Outreach Note  09/14/2021 Name: Luke Solis MRN: [id number] DOB: 02/02/1931  Luke Solis is a 87 y.o. year old male who is a primary care patient of [name], [name], MD. I reached out to Luke Solis by phone today in response to a referral sent by Luke Solis primary care provider.  An unsuccessful telephone outreach was attempted today. The patient was referred to the case management team for assistance with care management and care coordination.   Follow Up Plan: The care management team will reach out to the patient again over the next 7 days. If patient returns call to provider office, please advise to call [place]  at [phone].  [facility]  Direct Dial: [phone]

## 2021-09-15 DIAGNOSIS — E1165 Type 2 diabetes mellitus with hyperglycemia: Secondary | ICD-10-CM | POA: Diagnosis not present

## 2021-09-15 DIAGNOSIS — E1142 Type 2 diabetes mellitus with diabetic polyneuropathy: Secondary | ICD-10-CM | POA: Diagnosis not present

## 2021-09-15 DIAGNOSIS — E1122 Type 2 diabetes mellitus with diabetic chronic kidney disease: Secondary | ICD-10-CM | POA: Diagnosis not present

## 2021-09-15 DIAGNOSIS — I129 Hypertensive chronic kidney disease with stage 1 through stage 4 chronic kidney disease, or unspecified chronic kidney disease: Secondary | ICD-10-CM | POA: Diagnosis not present

## 2021-09-15 DIAGNOSIS — N35919 Unspecified urethral stricture, male, unspecified site: Secondary | ICD-10-CM | POA: Diagnosis not present

## 2021-09-15 DIAGNOSIS — Z466 Encounter for fitting and adjustment of urinary device: Secondary | ICD-10-CM | POA: Diagnosis not present

## 2021-09-21 NOTE — Chronic Care Management (AMB) (Signed)
  Chronic Care Management   Note  09/21/2021 Name: Luke Solis MRN: [id number] DOB: 10/14/1931  Luke Solis is a 87 y.o. year old male who is a primary care patient of [name], [name], MD. I reached out to Luke Solis by phone today in response to a referral sent by Luke Solis PCP.  Luke Solis was given information about Chronic Care Management services today including:  CCM service includes personalized support from designated clinical staff supervised by his physician, including individualized plan of care and coordination with other care providers 24/7 contact phone numbers for assistance for urgent and routine care needs. Service will only be billed when office clinical staff spend 20 minutes or more in a month to coordinate care. Only one practitioner may furnish and bill the service in a calendar month. The patient may stop CCM services at any time (effective at the end of the month) by phone call to the office staff. The patient is responsible for co-pay (up to 20% after annual deductible is met) if co-pay is required by the individual health plan.   Patient agreed to services and verbal consent obtained.   Follow up plan: Telephone appointment with care management team member scheduled for:10/07/21  [facility]  Direct Dial: [phone]

## 2021-09-22 DIAGNOSIS — H3561 Retinal hemorrhage, right eye: Secondary | ICD-10-CM | POA: Diagnosis not present

## 2021-09-22 DIAGNOSIS — H02831 Dermatochalasis of right upper eyelid: Secondary | ICD-10-CM | POA: Diagnosis not present

## 2021-09-22 DIAGNOSIS — H43813 Vitreous degeneration, bilateral: Secondary | ICD-10-CM | POA: Diagnosis not present

## 2021-09-22 DIAGNOSIS — H40013 Open angle with borderline findings, low risk, bilateral: Secondary | ICD-10-CM | POA: Diagnosis not present

## 2021-09-22 DIAGNOSIS — H26492 Other secondary cataract, left eye: Secondary | ICD-10-CM | POA: Diagnosis not present

## 2021-09-22 DIAGNOSIS — Z961 Presence of intraocular lens: Secondary | ICD-10-CM | POA: Diagnosis not present

## 2021-09-22 DIAGNOSIS — E119 Type 2 diabetes mellitus without complications: Secondary | ICD-10-CM | POA: Diagnosis not present

## 2021-09-22 DIAGNOSIS — H02834 Dermatochalasis of left upper eyelid: Secondary | ICD-10-CM | POA: Diagnosis not present

## 2021-09-23 DIAGNOSIS — H353211 Exudative age-related macular degeneration, right eye, with active choroidal neovascularization: Secondary | ICD-10-CM | POA: Diagnosis not present

## 2021-09-23 DIAGNOSIS — H353121 Nonexudative age-related macular degeneration, left eye, early dry stage: Secondary | ICD-10-CM | POA: Diagnosis not present

## 2021-09-23 DIAGNOSIS — H35363 Drusen (degenerative) of macula, bilateral: Secondary | ICD-10-CM | POA: Diagnosis not present

## 2021-09-23 DIAGNOSIS — H3561 Retinal hemorrhage, right eye: Secondary | ICD-10-CM | POA: Diagnosis not present

## 2021-09-23 DIAGNOSIS — Z961 Presence of intraocular lens: Secondary | ICD-10-CM | POA: Diagnosis not present

## 2021-09-23 DIAGNOSIS — H35453 Secondary pigmentary degeneration, bilateral: Secondary | ICD-10-CM | POA: Diagnosis not present

## 2021-09-23 DIAGNOSIS — H35451 Secondary pigmentary degeneration, right eye: Secondary | ICD-10-CM | POA: Diagnosis not present

## 2021-09-23 DIAGNOSIS — H35721 Serous detachment of retinal pigment epithelium, right eye: Secondary | ICD-10-CM | POA: Diagnosis not present

## 2021-09-25 ENCOUNTER — Encounter: Payer: Self-pay | Admitting: Family Medicine

## 2021-09-25 ENCOUNTER — Ambulatory Visit (INDEPENDENT_AMBULATORY_CARE_PROVIDER_SITE_OTHER): Payer: Medicare Other | Admitting: Family Medicine

## 2021-09-25 ENCOUNTER — Other Ambulatory Visit: Payer: Self-pay

## 2021-09-25 DIAGNOSIS — E1165 Type 2 diabetes mellitus with hyperglycemia: Secondary | ICD-10-CM | POA: Diagnosis not present

## 2021-09-25 DIAGNOSIS — N35919 Unspecified urethral stricture, male, unspecified site: Secondary | ICD-10-CM | POA: Diagnosis not present

## 2021-09-25 DIAGNOSIS — E1142 Type 2 diabetes mellitus with diabetic polyneuropathy: Secondary | ICD-10-CM | POA: Diagnosis not present

## 2021-09-25 DIAGNOSIS — U071 COVID-19: Secondary | ICD-10-CM | POA: Diagnosis not present

## 2021-09-25 DIAGNOSIS — I25119 Atherosclerotic heart disease of native coronary artery with unspecified angina pectoris: Secondary | ICD-10-CM | POA: Diagnosis not present

## 2021-09-25 DIAGNOSIS — I129 Hypertensive chronic kidney disease with stage 1 through stage 4 chronic kidney disease, or unspecified chronic kidney disease: Secondary | ICD-10-CM | POA: Diagnosis not present

## 2021-09-25 DIAGNOSIS — Z466 Encounter for fitting and adjustment of urinary device: Secondary | ICD-10-CM | POA: Diagnosis not present

## 2021-09-25 DIAGNOSIS — E1122 Type 2 diabetes mellitus with diabetic chronic kidney disease: Secondary | ICD-10-CM | POA: Diagnosis not present

## 2021-09-25 MED ORDER — BENZONATATE 200 MG PO CAPS: 200.0000 mg | ORAL_CAPSULE | Freq: Three times a day (TID) | ORAL | 0 refills | Status: DC | PRN

## 2021-09-25 MED ORDER — NIRMATRELVIR/RITONAVIR (PAXLOVID) TABLET (RENAL DOSING): ORAL_TABLET | ORAL | 0 refills | Status: DC

## 2021-09-25 NOTE — Patient Instructions (Addendum)
Do NOT take the Crestor while on the Paxlovid.  Use the cough medication as directed.   Call with concerns.  Take care  Dr. Lacinda Axon

## 2021-09-26 DIAGNOSIS — U071 COVID-19: Secondary | ICD-10-CM | POA: Insufficient documentation

## 2021-09-26 NOTE — Assessment & Plan Note (Signed)
High risk. Benign exam. Starting on Paxlovid (renally dosed). Hold Statin while on Paxlovid.

## 2021-09-26 NOTE — Progress Notes (Signed)
Subjective:  Patient ID: Luke Solis, male    DOB: [birth year]  Age: 87 y.o. MRN: [id number]  CC: Chief Complaint  Patient presents with   Covid Positive    Woke up with cough and headache this AM. Took 2 Tylenol to help headache. Not feeling good but not feeling real bad. COVID test this morning at home.     HPI:  87 year old male presents for evaluation of the above.  Symptoms started last night. Reports headache and cough. Tylenol this am resolved headache quickly. No fever. COVID test positive at home. No known sick contacts. No chest pain or SOB. No other complaints at this time.  Patient Active Problem List   Diagnosis Date Noted   COVID 09/26/2021   CKD (chronic kidney disease) stage 3, GFR 30-59 ml/min (HCC) 06/09/2021   UTI (urinary tract infection) 06/08/2021   Hypokalemia 06/08/2021   Leukocytosis 06/08/2021   Hyperglycemia due to diabetes mellitus ([place]) 06/08/2021   Chronic kidney disease 06/08/2021   Poorly controlled type 2 diabetes mellitus with peripheral neuropathy ([place]) 08/11/2019   OSA on CPAP 09/05/2018   Altered mental status 02/28/2018   Anemia 02/28/2018   Fatigue 08/03/2017   Prostate cancer ([place]) 10/22/2015   Forgetfulness 04/29/2015   Type 2 diabetes mellitus with diabetic neuropathy ([place]) 07/17/2013   Hyperlipidemia 01/19/2010   OSA (obstructive sleep apnea) 01/19/2010   Essential hypertension, benign 01/19/2010   Coronary atherosclerosis of native coronary artery 01/19/2010    Social Hx   Social History   Socioeconomic History   Marital status: Married    Spouse name: Not on file   Number of children: Not on file   Years of education: Not on file   Highest education level: Not on file  Occupational History   Not on file  Tobacco Use   Smoking status: Never   Smokeless tobacco: Never  Vaping Use   Vaping Use: Never used  Substance and Sexual Activity   Alcohol use: No    Alcohol/week: 0.0 standard drinks   Drug use: No   Sexual  activity: Not on file  Other Topics Concern   Not on file  Social History Narrative   [facility] (08/03/17):   Originally from [facility]. Has always lived in [place]. 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 No pets currently. No bird or mold exposure.    Social Determinants of Health   Financial Resource Strain: Not on file  Food Insecurity: Not on file  Transportation Needs: Not on file  Physical Activity: Not on file  Stress: Not on file  Social Connections: Not on file    Review of Systems  Constitutional:  Negative for fever.  Respiratory:  Positive for cough. Negative for shortness of breath.   Cardiovascular: Negative.   Neurological:  Positive for headaches.    Objective:  BP (!) 175/81   Pulse (!) 52   Temp 98.8 F (37.1 C)   Ht 6\' 2"  (1.88 m)   Wt 157 lb 6.4 oz (71.4 kg)   SpO2 98%   BMI 20.21 kg/m   BP/Weight 09/25/2021 6/96/7893 07/06/174  Systolic BP 102 585 277  Diastolic BP 81 74 72  Wt. (Lbs) 157.4 157.6 154  BMI 20.21 20.23 19.77    Physical Exam Vitals and nursing note reviewed.  Constitutional:      General: He is not in acute distress.    Appearance: Normal appearance. He is not ill-appearing.  HENT:  Head: Normocephalic and atraumatic.  Cardiovascular:     Rate and Rhythm: Normal rate and regular rhythm.  Pulmonary:     Effort: Pulmonary effort is normal.     Breath sounds: Normal breath sounds. No wheezing or rales.  Neurological:     Mental Status: He is alert.  Psychiatric:        Mood and Affect: Mood normal.        Behavior: Behavior normal.    Lab Results  Component Value Date   WBC 7.2 08/26/2021   HGB 12.2 (L) 08/26/2021   HCT 37.0 (L) 08/26/2021   PLT 267 08/26/2021   GLUCOSE 296 (H) 08/26/2021   CHOL 168 03/25/2021   TRIG 98 03/25/2021   HDL 49 03/25/2021   LDLCALC 101 (H) 03/25/2021   ALT 8 08/26/2021   AST 12 08/26/2021   NA 138 08/26/2021   K 3.9 08/26/2021   CL 100 08/26/2021   CREATININE  1.39 (H) 08/26/2021   BUN 24 08/26/2021   CO2 23 08/26/2021   TSH 1.740 10/20/2020   INR 1.2 06/09/2021   HGBA1C 9.0 (H) 06/08/2021   MICROALBUR 3.18 (H) 03/12/2014     Assessment & Plan:   Problem List Items Addressed This Visit       Other   COVID    High risk. Benign exam. Starting on Paxlovid (renally dosed). Hold Statin while on Paxlovid.      Relevant Medications   nirmatrelvir/ritonavir EUA, renal dosing, (PAXLOVID) 10 x 150 MG & 10 x 100MG  TABS    Meds ordered this encounter  Medications   nirmatrelvir/ritonavir EUA, renal dosing, (PAXLOVID) 10 x 150 MG & 10 x 100MG  TABS    Sig: Take nirmatrelvir 150 mg one tablet twice daily for 5 days and ritonavir 100 mg one tablet twice daily for 5 days. Patient GFR is 49.    Dispense:  20 tablet    Refill:  0   benzonatate (TESSALON) 200 MG capsule    Sig: Take 1 capsule (200 mg total) by mouth 3 (three) times daily as needed for cough.    Dispense:  30 capsule    Refill:  0    Follow-up:  PRN  [place]

## 2021-09-29 ENCOUNTER — Other Ambulatory Visit: Payer: Self-pay | Admitting: Family Medicine

## 2021-09-30 ENCOUNTER — Ambulatory Visit: Payer: Medicare Other | Admitting: Family Medicine

## 2021-10-07 ENCOUNTER — Ambulatory Visit (INDEPENDENT_AMBULATORY_CARE_PROVIDER_SITE_OTHER): Payer: Medicare Other

## 2021-10-07 ENCOUNTER — Telehealth: Payer: Medicare Other

## 2021-10-07 DIAGNOSIS — E1140 Type 2 diabetes mellitus with diabetic neuropathy, unspecified: Secondary | ICD-10-CM

## 2021-10-07 DIAGNOSIS — E785 Hyperlipidemia, unspecified: Secondary | ICD-10-CM

## 2021-10-07 DIAGNOSIS — I10 Essential (primary) hypertension: Secondary | ICD-10-CM

## 2021-10-07 DIAGNOSIS — I2510 Atherosclerotic heart disease of native coronary artery without angina pectoris: Secondary | ICD-10-CM

## 2021-10-07 NOTE — Patient Instructions (Addendum)
Visit Information:  Thank you for taking the time to speak with me today.   Next follow up visit:  November 11, 2021 at 11:30 am   PATIENT GOALS/PLAN OF CARE:  Care Plan : RN- Care Manager Plan of Care  Updates made by Dannielle Karvonen, RN since 10/07/2021 12:00 AM     Problem: Chronic Disease management, Education, and care coordination needs ( Diabetes II, Cardiovascular disease)   Priority: High     Long-Range Goal: Development of plan of care to address chronic disease management and care coordination needs ( Diabetes II, Cardiovascular disease)   Start Date: 10/07/2021  Expected End Date: 02/02/2022  Priority: High  Note:   Current Barriers:  Knowledge Deficits related to plan of care for management of CAD, HTN, HLD, DMII, and falls Chronic Disease Management support and education needs related to CAD, HTN, HLD, DMII, and falls HIPAA verified with patient.  Patient gave verbal permission to speak with his wife, Luke Solis who was also on call with patient.  Patient states he is receiving PT/ OT from [facility] due to weakness/ deconditioning.  He states he was in the hospital over the summer due to urinary tract infection and sepsis and became weak and fatigued.  Patient reports having 2 falls in the past year without injury. Uses walker for ambulation. He states he uses the free style libre for blood sugar monitoring. He reports today's fasting blood sugar was 96 and states blood sugars range from 100- 140's.  Denies blood sugars <70. Patient reports last follow up with cardiologist was 08/13/2021.  Next follow up with primary care provider is 10/15/2021.  Patient reports having suprapubic catheter and is followed by urologist every 6 months.  RNCM Clinical Goal(s):  Patient will verbalize understanding of plan for management of CAD, HTN, HLD, DMII, and Falls verbalize basic understanding of  CAD, HTN, HLD, DMII, and Falls disease process and self health management plan    through  collaboration with RN Care manager, provider, and care team.   Interventions: 1:1 collaboration with primary care provider regarding development and update of comprehensive plan of care as evidenced by provider attestation and co-signature Inter-disciplinary care team collaboration (see longitudinal plan of care) Evaluation of current treatment plan related to  self management and patient's adherence to plan as established by provider  Diabetes Interventions:  New Goal  Assessed patient's understanding of A1c goal: <7% Reviewed medications with patient and discussed importance of medication adherence; Discussed plans with patient for ongoing care management follow up and provided patient with direct contact information for care management team; Provided patient with written educational materials related to hypo and hyperglycemia and importance of correct treatment; Reviewed scheduled/upcoming provider appointments including:  ; Lab Results  Component Value Date   HGBA1C 9.0 (H) 06/08/2021  Cardiovascular Interventions (HTN, CAD, HLD) : New Goal  Assessed understanding of CAD diagnosis Medications reviewed including medications utilized in CAD treatment plan Provided education on importance of blood pressure control in management of CAD; Provided education on Importance of limiting foods high in cholesterol; Reviewed Importance of taking all medications as prescribed Reviewed Importance of attending all scheduled provider appointments  Patient Goals/Self-Care Activities: Patient will self administer medications as prescribed Patient will attend all scheduled provider appointments Patient will call provider office for new concerns or questions Patient will review education articles sent by RN case manager regarding:  Diabetes ( hypoglycemia/ hyperglycemia management and  Follow RULE OF 15 for low blood sugar management:  How to treat low blood sugars (Blood sugar less than 70  mg/dl  Please follow the RULE OF 15 for the treatment of hypoglycemia treatment (When your blood sugars are less than 70 mg/ dl) STEP  1:  Take 15 grams of carbohydrates when your blood sugar is low, which includes:   3-4 glucose tabs or  3-4 oz of juice or regular soda or  One tube of glucose gel STEP 2:  Recheck blood sugar in 15 minutes STEP 3:  If your blood sugar is still low at the 15 minute recheck ---then, go back to STEP 1 and treat again with another 15 grams of carbohydrates       Consent to CCM Services: Luke Solis was given information about Chronic Care Management services including:  CCM service includes personalized support from designated clinical staff supervised by his physician, including individualized plan of care and coordination with other care providers 24/7 contact phone numbers for assistance for urgent and routine care needs. Service will only be billed when office clinical staff spend 20 minutes or more in a month to coordinate care. Only one practitioner may furnish and bill the service in a calendar month. The patient may stop CCM services at any time (effective at the end of the month) by phone call to the office staff. The patient will be responsible for cost sharing (co-pay) of up to 20% of the service fee (after annual deductible is met).  Patient agreed to services and verbal consent obtained.   The patient verbalized understanding of instructions, educational materials, and care plan provided today and agreed to receive a mailed copy of patient instructions, educational materials, and care plan.   The patient has been provided with contact information for the care management team and has been advised to call with any health related questions or concerns.  The care management team will reach out to the patient again over the next November 11, 2021 at 11:30 am .   Covering RNCM:   Quinn Plowman RN,BSN,CCM RN Case Manager [phone]

## 2021-10-07 NOTE — Chronic Care Management (AMB) (Signed)
Chronic Care Management   CCM RN Visit Note  10/07/2021 Name: Luke Solis MRN: [id number] DOB: 04-25-1931  Subjective: Luke Solis is a 87 y.o. year old male who is a primary care patient of [name], [name], MD. The care management team was consulted for assistance with disease management and care coordination needs.    Engaged with patient by telephone for initial visit in response to provider referral for case management and/or care coordination services.   Consent to Services:  The patient was given the following information about Chronic Care Management services today, agreed to services, and gave verbal consent: 1. CCM service includes personalized support from designated clinical staff supervised by the primary care provider, including individualized plan of care and coordination with other care providers 2. 24/7 contact phone numbers for assistance for urgent and routine care needs. 3. Service will only be billed when office clinical staff spend 20 minutes or more in a month to coordinate care. 4. Only one practitioner may furnish and bill the service in a calendar month. 5.The patient may stop CCM services at any time (effective at the end of the month) by phone call to the office staff. 6. The patient will be responsible for cost sharing (co-pay) of up to 20% of the service fee (after annual deductible is met). Patient agreed to services and consent obtained.  Patient agreed to services and verbal consent obtained.   Assessment: Review of patient past medical history, allergies, medications, health status, including review of consultants reports, laboratory and other test data, was performed as part of comprehensive evaluation and provision of chronic care management services.   SDOH (Social Determinants of Health) assessments and interventions performed:  SDOH Interventions    Flowsheet Row Most Recent Value  SDOH Interventions   Food Insecurity Interventions Intervention Not  Indicated  Housing Interventions Intervention Not Indicated  Transportation Interventions Intervention Not Indicated        CCM Care Plan  Allergies  Allergen Reactions   Broccoli [Brassica Oleracea] Swelling    Patient states causes swelling in throat.   Celery Oil Nausea And Vomiting    Outpatient Encounter Medications as of 10/07/2021  Medication Sig   amLODipine (NORVASC) 5 MG tablet TAKE 1 TABLET ONCE DAILY.   benzonatate (TESSALON) 200 MG capsule Take 1 capsule (200 mg total) by mouth 3 (three) times daily as needed for cough.   Calcium Carbonate-Vitamin D 600-400 MG-UNIT per tablet Take 1 tablet by mouth daily.   fexofenadine (ALLEGRA) 180 MG tablet Take 1 tablet (180 mg total) by mouth daily as needed for allergies.   insulin glargine (LANTUS SOLOSTAR) 100 UNIT/ML Solostar Pen Inject 20 Units into the skin at bedtime.   insulin lispro (HUMALOG KWIKPEN) 100 UNIT/ML KwikPen Inject 5 Units into the skin 3 (three) times daily with meals. Under 100-no units 101-175-3 units 176-250-4 units 251-350-5 units 351-450-6 units-call provider [phone] units-call provider Greater than 525 call providerGive only if eats 50% or more of the meal.   ketoconazole (NIZORAL) 2 % cream APPLY THIN AMOUNT TWICE DAILY TO INFLAMMED AREA OF GENITALS.   Multiple Vitamin (MULTIVITAMIN) tablet Take 1 tablet by mouth daily.   nitroGLYCERIN (NITROSTAT) 0.4 MG SL tablet Place 1 tablet (0.4 mg total) under the tongue every 5 (five) minutes as needed for chest pain.   potassium chloride (KLOR-CON) 10 MEQ tablet TAKE 1 TABLET MONDAY, WEDNESDAY & FRIDAY   pregabalin (LYRICA) 25 MG capsule Take 1 capsule (25 mg total) by mouth at bedtime  as needed (neuropathy pain). 1 in the morning 2 in the evening   rosuvastatin (CRESTOR) 5 MG tablet TAKE 1 TABLET ONCE DAILY.   Continuous Blood Gluc Receiver (FREESTYLE LIBRE 14 DAY READER) DEVI On insulin shots 4 times a day Dx E11.9   Continuous Blood Gluc Sensor (FREESTYLE  LIBRE 14 DAY SENSOR) MISC On insulin 4 times daily type 2 dm on insulin   FORACARE PREMIUM V10 TEST test strip USE AS DIRECTED.   GLOBAL EASE INJECT PEN NEEDLES 32G X 4 MM MISC USE TO INJECT LANTUS EVERY EVENING AND HUMALOG THREE (3) TIMES DAILY.   nirmatrelvir/ritonavir EUA, renal dosing, (PAXLOVID) 10 x 150 MG & 10 x 100MG TABS Take nirmatrelvir 150 mg one tablet twice daily for 5 days and ritonavir 100 mg one tablet twice daily for 5 days. Patient GFR is 49. (Patient not taking: Reported on 10/07/2021)   No facility-administered encounter medications on file as of 10/07/2021.    Patient Active Problem List   Diagnosis Date Noted   COVID 09/26/2021   CKD (chronic kidney disease) stage 3, GFR 30-59 ml/min (HCC) 06/09/2021   UTI (urinary tract infection) 06/08/2021   Hypokalemia 06/08/2021   Leukocytosis 06/08/2021   Hyperglycemia due to diabetes mellitus ([place]) 06/08/2021   Chronic kidney disease 06/08/2021   Poorly controlled type 2 diabetes mellitus with peripheral neuropathy ([place]) 08/11/2019   OSA on CPAP 09/05/2018   Altered mental status 02/28/2018   Anemia 02/28/2018   Fatigue 08/03/2017   Prostate cancer ([place]) 10/22/2015   Forgetfulness 04/29/2015   Type 2 diabetes mellitus with diabetic neuropathy ([place]) 07/17/2013   Hyperlipidemia 01/19/2010   OSA (obstructive sleep apnea) 01/19/2010   Essential hypertension, benign 01/19/2010   Coronary atherosclerosis of native coronary artery 01/19/2010    Conditions to be addressed/monitored:CAD, HTN, HLD, and DMII  Care Plan : RN- Care Manager Plan of Care  Updates made by Dannielle Karvonen, RN since 10/07/2021 12:00 AM     Problem: Chronic Disease management, Education, and care coordination needs ( Diabetes II, Cardiovascular disease)   Priority: High     Long-Range Goal: Development of plan of care to address chronic disease management and care coordination needs ( Diabetes II, Cardiovascular disease)   Start Date: 10/07/2021   Expected End Date: 02/02/2022  Priority: High  Note:   Current Barriers:  Knowledge Deficits related to plan of care for management of CAD, HTN, HLD, DMII, and falls Chronic Disease Management support and education needs related to CAD, HTN, HLD, DMII, and falls HIPAA verified with patient.  Patient gave verbal permission to speak with his wife, Luke Solis who was also on call with patient.  Patient states he is receiving PT/ OT from [facility] due to weakness/ deconditioning.  He states he was in the hospital over the summer due to urinary tract infection and sepsis and became weak and fatigued.  Patient reports having 2 falls in the past year without injury. Uses walker for ambulation. He states he uses the free style libre for blood sugar monitoring. He reports today's fasting blood sugar was 96 and states blood sugars range from 100- 140's.  Denies blood sugars <70. Patient reports last follow up with cardiologist was 08/13/2021.  Next follow up with primary care provider is 10/15/2021.  Patient reports having suprapubic catheter and is followed by urologist every 6 months.  RNCM Clinical Goal(s):  Patient will verbalize understanding of plan for management of CAD, HTN, HLD, DMII, and Falls verbalize basic understanding  of  CAD, HTN, HLD, DMII, and Falls disease process and self health management plan    through collaboration with RN Care manager, provider, and care team.   Interventions: 1:1 collaboration with primary care provider regarding development and update of comprehensive plan of care as evidenced by provider attestation and co-signature Inter-disciplinary care team collaboration (see longitudinal plan of care) Evaluation of current treatment plan related to  self management and patient's adherence to plan as established by provider  Diabetes Interventions:  New Goal  Assessed patient's understanding of A1c goal: <7% Reviewed medications with patient and discussed  importance of medication adherence; Discussed plans with patient for ongoing care management follow up and provided patient with direct contact information for care management team; Provided patient with written educational materials related to hypo and hyperglycemia and importance of correct treatment; Reviewed scheduled/upcoming provider appointments including:  ; Lab Results  Component Value Date   HGBA1C 9.0 (H) 06/08/2021  Cardiovascular Interventions (HTN, CAD, HLD) : New Goal  Assessed understanding of CAD diagnosis Medications reviewed including medications utilized in CAD treatment plan Provided education on importance of blood pressure control in management of CAD; Provided education on Importance of limiting foods high in cholesterol; Reviewed Importance of taking all medications as prescribed Reviewed Importance of attending all scheduled provider appointments  Patient Goals/Self-Care Activities: Patient will self administer medications as prescribed Patient will attend all scheduled provider appointments Patient will call provider office for new concerns or questions Patient will review education articles sent by RN case manager regarding:  Diabetes ( hypoglycemia/ hyperglycemia management and  Follow RULE OF 15 for low blood sugar management:  How to treat low blood sugars (Blood sugar less than 70 mg/dl  Please follow the RULE OF 15 for the treatment of hypoglycemia treatment (When your blood sugars are less than 70 mg/ dl) STEP  1:  Take 15 grams of carbohydrates when your blood sugar is low, which includes:   3-4 glucose tabs or  3-4 oz of juice or regular soda or  One tube of glucose gel STEP 2:  Recheck blood sugar in 15 minutes STEP 3:  If your blood sugar is still low at the 15 minute recheck ---then, go back to STEP 1 and treat again with another 15 grams of carbohydrates       Plan:The patient has been provided with contact information for the care management  team and has been advised to call with any health related questions or concerns.  The care management team will reach out to the patient again over the next  November 11, 2021 at 11:30 am  days.  Covering RNCM:  Quinn Plowman RN,BSN,CCM RN Case Manager [phone]

## 2021-10-08 DIAGNOSIS — Z4803 Encounter for change or removal of drains: Secondary | ICD-10-CM | POA: Diagnosis not present

## 2021-10-09 ENCOUNTER — Telehealth: Payer: Self-pay | Admitting: Family Medicine

## 2021-10-09 ENCOUNTER — Other Ambulatory Visit: Payer: Self-pay | Admitting: Family Medicine

## 2021-10-09 DIAGNOSIS — Z466 Encounter for fitting and adjustment of urinary device: Secondary | ICD-10-CM | POA: Diagnosis not present

## 2021-10-09 DIAGNOSIS — E1122 Type 2 diabetes mellitus with diabetic chronic kidney disease: Secondary | ICD-10-CM | POA: Diagnosis not present

## 2021-10-09 DIAGNOSIS — I129 Hypertensive chronic kidney disease with stage 1 through stage 4 chronic kidney disease, or unspecified chronic kidney disease: Secondary | ICD-10-CM | POA: Diagnosis not present

## 2021-10-09 DIAGNOSIS — E1165 Type 2 diabetes mellitus with hyperglycemia: Secondary | ICD-10-CM | POA: Diagnosis not present

## 2021-10-09 DIAGNOSIS — E1142 Type 2 diabetes mellitus with diabetic polyneuropathy: Secondary | ICD-10-CM | POA: Diagnosis not present

## 2021-10-09 DIAGNOSIS — N35919 Unspecified urethral stricture, male, unspecified site: Secondary | ICD-10-CM | POA: Diagnosis not present

## 2021-10-09 NOTE — Telephone Encounter (Signed)
FYI Daughter called to inform Dr. Nicki Solis that he missed his home health therapy this due to a tooth ache but will be taking care of that next week.  [phone] Luke Solis [phone] Daughter

## 2021-10-12 DIAGNOSIS — Z794 Long term (current) use of insulin: Secondary | ICD-10-CM | POA: Diagnosis not present

## 2021-10-12 DIAGNOSIS — I2510 Atherosclerotic heart disease of native coronary artery without angina pectoris: Secondary | ICD-10-CM | POA: Diagnosis not present

## 2021-10-12 DIAGNOSIS — N35919 Unspecified urethral stricture, male, unspecified site: Secondary | ICD-10-CM | POA: Diagnosis not present

## 2021-10-12 DIAGNOSIS — R4189 Other symptoms and signs involving cognitive functions and awareness: Secondary | ICD-10-CM | POA: Diagnosis not present

## 2021-10-12 DIAGNOSIS — N1832 Chronic kidney disease, stage 3b: Secondary | ICD-10-CM | POA: Diagnosis not present

## 2021-10-12 DIAGNOSIS — Z435 Encounter for attention to cystostomy: Secondary | ICD-10-CM | POA: Diagnosis not present

## 2021-10-12 DIAGNOSIS — E113211 Type 2 diabetes mellitus with mild nonproliferative diabetic retinopathy with macular edema, right eye: Secondary | ICD-10-CM | POA: Diagnosis not present

## 2021-10-12 DIAGNOSIS — G4733 Obstructive sleep apnea (adult) (pediatric): Secondary | ICD-10-CM | POA: Diagnosis not present

## 2021-10-12 DIAGNOSIS — E1142 Type 2 diabetes mellitus with diabetic polyneuropathy: Secondary | ICD-10-CM | POA: Diagnosis not present

## 2021-10-12 DIAGNOSIS — I129 Hypertensive chronic kidney disease with stage 1 through stage 4 chronic kidney disease, or unspecified chronic kidney disease: Secondary | ICD-10-CM | POA: Diagnosis not present

## 2021-10-12 DIAGNOSIS — Z8546 Personal history of malignant neoplasm of prostate: Secondary | ICD-10-CM | POA: Diagnosis not present

## 2021-10-12 DIAGNOSIS — E1165 Type 2 diabetes mellitus with hyperglycemia: Secondary | ICD-10-CM | POA: Diagnosis not present

## 2021-10-12 DIAGNOSIS — Z8673 Personal history of transient ischemic attack (TIA), and cerebral infarction without residual deficits: Secondary | ICD-10-CM | POA: Diagnosis not present

## 2021-10-12 DIAGNOSIS — E785 Hyperlipidemia, unspecified: Secondary | ICD-10-CM | POA: Diagnosis not present

## 2021-10-12 DIAGNOSIS — E1122 Type 2 diabetes mellitus with diabetic chronic kidney disease: Secondary | ICD-10-CM | POA: Diagnosis not present

## 2021-10-13 ENCOUNTER — Other Ambulatory Visit: Payer: Self-pay | Admitting: Family Medicine

## 2021-10-13 DIAGNOSIS — E1142 Type 2 diabetes mellitus with diabetic polyneuropathy: Secondary | ICD-10-CM | POA: Diagnosis not present

## 2021-10-13 DIAGNOSIS — E1122 Type 2 diabetes mellitus with diabetic chronic kidney disease: Secondary | ICD-10-CM | POA: Diagnosis not present

## 2021-10-13 DIAGNOSIS — I129 Hypertensive chronic kidney disease with stage 1 through stage 4 chronic kidney disease, or unspecified chronic kidney disease: Secondary | ICD-10-CM | POA: Diagnosis not present

## 2021-10-13 DIAGNOSIS — E1165 Type 2 diabetes mellitus with hyperglycemia: Secondary | ICD-10-CM | POA: Diagnosis not present

## 2021-10-13 DIAGNOSIS — N35919 Unspecified urethral stricture, male, unspecified site: Secondary | ICD-10-CM | POA: Diagnosis not present

## 2021-10-13 DIAGNOSIS — Z435 Encounter for attention to cystostomy: Secondary | ICD-10-CM | POA: Diagnosis not present

## 2021-10-14 DIAGNOSIS — E1122 Type 2 diabetes mellitus with diabetic chronic kidney disease: Secondary | ICD-10-CM | POA: Diagnosis not present

## 2021-10-14 DIAGNOSIS — I129 Hypertensive chronic kidney disease with stage 1 through stage 4 chronic kidney disease, or unspecified chronic kidney disease: Secondary | ICD-10-CM | POA: Diagnosis not present

## 2021-10-14 DIAGNOSIS — E1142 Type 2 diabetes mellitus with diabetic polyneuropathy: Secondary | ICD-10-CM | POA: Diagnosis not present

## 2021-10-14 DIAGNOSIS — Z435 Encounter for attention to cystostomy: Secondary | ICD-10-CM | POA: Diagnosis not present

## 2021-10-14 DIAGNOSIS — N35919 Unspecified urethral stricture, male, unspecified site: Secondary | ICD-10-CM | POA: Diagnosis not present

## 2021-10-14 DIAGNOSIS — E1165 Type 2 diabetes mellitus with hyperglycemia: Secondary | ICD-10-CM | POA: Diagnosis not present

## 2021-10-15 ENCOUNTER — Ambulatory Visit (INDEPENDENT_AMBULATORY_CARE_PROVIDER_SITE_OTHER): Payer: Medicare Other | Admitting: Family Medicine

## 2021-10-15 ENCOUNTER — Other Ambulatory Visit: Payer: Self-pay

## 2021-10-15 VITALS — BP 122/78 | Temp 96.6°F | Wt 156.8 lb

## 2021-10-15 DIAGNOSIS — Z794 Long term (current) use of insulin: Secondary | ICD-10-CM

## 2021-10-15 DIAGNOSIS — E1140 Type 2 diabetes mellitus with diabetic neuropathy, unspecified: Secondary | ICD-10-CM | POA: Diagnosis not present

## 2021-10-15 DIAGNOSIS — I2510 Atherosclerotic heart disease of native coronary artery without angina pectoris: Secondary | ICD-10-CM

## 2021-10-15 DIAGNOSIS — I10 Essential (primary) hypertension: Secondary | ICD-10-CM

## 2021-10-15 MED ORDER — AMLODIPINE BESYLATE 2.5 MG PO TABS: 2.5000 mg | ORAL_TABLET | Freq: Every day | ORAL | 5 refills | Status: DC

## 2021-10-15 MED ORDER — MUPIROCIN 2 % EX OINT: TOPICAL_OINTMENT | CUTANEOUS | 5 refills | Status: DC

## 2021-10-15 NOTE — Progress Notes (Signed)
   Subjective:    Patient ID: Luke Solis, male    DOB: Mar 06, 1931, 87 y.o.   MRN: [id number]  HPI Pt here for follow up. Pt states he has improved since last time here. Pt states he had some set back with COVID but has improved. Patient is trying to do better with diet Has a suprapubic catheter Will be followed by urology for this has an appointment tomorrow Feels like he is overall improving Trying to keep his weight up Sugars at times are running on the low side in the morning but later in the day are in the moderate level but not excessively high Patient still walks with a walker but trying to do physical therapy to gain strength in his legs  Review of Systems     Objective:   Physical Exam He is a good physical therapy candidate No edema in the legs Suprapubic catheter overall looks good Abdomen soft Lungs are clear heart is regular pulse normal extremities no edema skin warm dry       Assessment & Plan:   He is a good candidate for physical therapy continue home physical therapy to strengthen the legs Blood pressure unfortunately drops when he stands up reduce amlodipine new dose 2.5 mg  Glucose readings to strict recommend reducing the long-acting from 20 new dosing of 18  Warning signs and what to watch for regarding low sugars was discussed  Suprapubic catheter recommend Bactroban ointment to be applied to that area follow-up if ongoing troubles see urology  Recheck patient in 4  months to avoid being around sick people here at the office in the wintertime but we are available to him if he would like to be seen sooner

## 2021-10-16 DIAGNOSIS — E1122 Type 2 diabetes mellitus with diabetic chronic kidney disease: Secondary | ICD-10-CM | POA: Diagnosis not present

## 2021-10-16 DIAGNOSIS — E1165 Type 2 diabetes mellitus with hyperglycemia: Secondary | ICD-10-CM | POA: Diagnosis not present

## 2021-10-16 DIAGNOSIS — I129 Hypertensive chronic kidney disease with stage 1 through stage 4 chronic kidney disease, or unspecified chronic kidney disease: Secondary | ICD-10-CM | POA: Diagnosis not present

## 2021-10-16 DIAGNOSIS — E1142 Type 2 diabetes mellitus with diabetic polyneuropathy: Secondary | ICD-10-CM | POA: Diagnosis not present

## 2021-10-16 DIAGNOSIS — Z435 Encounter for attention to cystostomy: Secondary | ICD-10-CM | POA: Diagnosis not present

## 2021-10-16 DIAGNOSIS — N35919 Unspecified urethral stricture, male, unspecified site: Secondary | ICD-10-CM | POA: Diagnosis not present

## 2021-10-16 LAB — HEMOGLOBIN A1C
Est. average glucose Bld gHb Est-mCnc: 169 mg/dL
Hgb A1c MFr Bld: 7.5 % — ABNORMAL HIGH (ref 4.8–5.6)

## 2021-10-16 LAB — BASIC METABOLIC PANEL
BUN/Creatinine Ratio: 15 (ref 10–24)
BUN: 20 mg/dL (ref 8–27)
CO2: 24 mmol/L (ref 20–29)
Calcium: 9.2 mg/dL (ref 8.6–10.2)
Chloride: 106 mmol/L (ref 96–106)
Creatinine, Ser: 1.36 mg/dL — ABNORMAL HIGH (ref 0.76–1.27)
Glucose: 153 mg/dL — ABNORMAL HIGH (ref 70–99)
Potassium: 4.1 mmol/L (ref 3.5–5.2)
Sodium: 147 mmol/L — ABNORMAL HIGH (ref 134–144)
eGFR: 50 mL/min/{1.73_m2} — ABNORMAL LOW (ref >59)

## 2021-10-19 ENCOUNTER — Other Ambulatory Visit: Payer: Self-pay | Admitting: Family Medicine

## 2021-10-19 DIAGNOSIS — I10 Essential (primary) hypertension: Secondary | ICD-10-CM

## 2021-10-19 DIAGNOSIS — E1142 Type 2 diabetes mellitus with diabetic polyneuropathy: Secondary | ICD-10-CM | POA: Diagnosis not present

## 2021-10-19 DIAGNOSIS — E1165 Type 2 diabetes mellitus with hyperglycemia: Secondary | ICD-10-CM | POA: Diagnosis not present

## 2021-10-19 DIAGNOSIS — Z794 Long term (current) use of insulin: Secondary | ICD-10-CM

## 2021-10-19 DIAGNOSIS — E1140 Type 2 diabetes mellitus with diabetic neuropathy, unspecified: Secondary | ICD-10-CM

## 2021-10-19 DIAGNOSIS — I129 Hypertensive chronic kidney disease with stage 1 through stage 4 chronic kidney disease, or unspecified chronic kidney disease: Secondary | ICD-10-CM | POA: Diagnosis not present

## 2021-10-19 DIAGNOSIS — E1122 Type 2 diabetes mellitus with diabetic chronic kidney disease: Secondary | ICD-10-CM | POA: Diagnosis not present

## 2021-10-19 DIAGNOSIS — N35919 Unspecified urethral stricture, male, unspecified site: Secondary | ICD-10-CM | POA: Diagnosis not present

## 2021-10-19 DIAGNOSIS — Z435 Encounter for attention to cystostomy: Secondary | ICD-10-CM | POA: Diagnosis not present

## 2021-10-20 DIAGNOSIS — E1165 Type 2 diabetes mellitus with hyperglycemia: Secondary | ICD-10-CM | POA: Diagnosis not present

## 2021-10-20 DIAGNOSIS — N35919 Unspecified urethral stricture, male, unspecified site: Secondary | ICD-10-CM | POA: Diagnosis not present

## 2021-10-20 DIAGNOSIS — Z435 Encounter for attention to cystostomy: Secondary | ICD-10-CM | POA: Diagnosis not present

## 2021-10-20 DIAGNOSIS — E1142 Type 2 diabetes mellitus with diabetic polyneuropathy: Secondary | ICD-10-CM | POA: Diagnosis not present

## 2021-10-20 DIAGNOSIS — I129 Hypertensive chronic kidney disease with stage 1 through stage 4 chronic kidney disease, or unspecified chronic kidney disease: Secondary | ICD-10-CM | POA: Diagnosis not present

## 2021-10-20 DIAGNOSIS — E1122 Type 2 diabetes mellitus with diabetic chronic kidney disease: Secondary | ICD-10-CM | POA: Diagnosis not present

## 2021-10-21 DIAGNOSIS — M79672 Pain in left foot: Secondary | ICD-10-CM | POA: Diagnosis not present

## 2021-10-21 DIAGNOSIS — L609 Nail disorder, unspecified: Secondary | ICD-10-CM | POA: Diagnosis not present

## 2021-10-21 DIAGNOSIS — M79671 Pain in right foot: Secondary | ICD-10-CM | POA: Diagnosis not present

## 2021-10-21 DIAGNOSIS — E1140 Type 2 diabetes mellitus with diabetic neuropathy, unspecified: Secondary | ICD-10-CM | POA: Diagnosis not present

## 2021-10-22 DIAGNOSIS — E1122 Type 2 diabetes mellitus with diabetic chronic kidney disease: Secondary | ICD-10-CM | POA: Diagnosis not present

## 2021-10-22 DIAGNOSIS — E1142 Type 2 diabetes mellitus with diabetic polyneuropathy: Secondary | ICD-10-CM | POA: Diagnosis not present

## 2021-10-22 DIAGNOSIS — I129 Hypertensive chronic kidney disease with stage 1 through stage 4 chronic kidney disease, or unspecified chronic kidney disease: Secondary | ICD-10-CM | POA: Diagnosis not present

## 2021-10-22 DIAGNOSIS — E1165 Type 2 diabetes mellitus with hyperglycemia: Secondary | ICD-10-CM | POA: Diagnosis not present

## 2021-10-22 DIAGNOSIS — N35919 Unspecified urethral stricture, male, unspecified site: Secondary | ICD-10-CM | POA: Diagnosis not present

## 2021-10-22 DIAGNOSIS — Z435 Encounter for attention to cystostomy: Secondary | ICD-10-CM | POA: Diagnosis not present

## 2021-10-23 DIAGNOSIS — Z435 Encounter for attention to cystostomy: Secondary | ICD-10-CM | POA: Diagnosis not present

## 2021-10-23 DIAGNOSIS — E1165 Type 2 diabetes mellitus with hyperglycemia: Secondary | ICD-10-CM | POA: Diagnosis not present

## 2021-10-23 DIAGNOSIS — I129 Hypertensive chronic kidney disease with stage 1 through stage 4 chronic kidney disease, or unspecified chronic kidney disease: Secondary | ICD-10-CM | POA: Diagnosis not present

## 2021-10-23 DIAGNOSIS — E1142 Type 2 diabetes mellitus with diabetic polyneuropathy: Secondary | ICD-10-CM | POA: Diagnosis not present

## 2021-10-23 DIAGNOSIS — E1122 Type 2 diabetes mellitus with diabetic chronic kidney disease: Secondary | ICD-10-CM | POA: Diagnosis not present

## 2021-10-23 DIAGNOSIS — N35919 Unspecified urethral stricture, male, unspecified site: Secondary | ICD-10-CM | POA: Diagnosis not present

## 2021-10-26 DIAGNOSIS — N35919 Unspecified urethral stricture, male, unspecified site: Secondary | ICD-10-CM | POA: Diagnosis not present

## 2021-10-26 DIAGNOSIS — E1122 Type 2 diabetes mellitus with diabetic chronic kidney disease: Secondary | ICD-10-CM | POA: Diagnosis not present

## 2021-10-26 DIAGNOSIS — Z435 Encounter for attention to cystostomy: Secondary | ICD-10-CM | POA: Diagnosis not present

## 2021-10-26 DIAGNOSIS — E1142 Type 2 diabetes mellitus with diabetic polyneuropathy: Secondary | ICD-10-CM | POA: Diagnosis not present

## 2021-10-26 DIAGNOSIS — E1165 Type 2 diabetes mellitus with hyperglycemia: Secondary | ICD-10-CM | POA: Diagnosis not present

## 2021-10-26 DIAGNOSIS — I129 Hypertensive chronic kidney disease with stage 1 through stage 4 chronic kidney disease, or unspecified chronic kidney disease: Secondary | ICD-10-CM | POA: Diagnosis not present

## 2021-10-27 DIAGNOSIS — I129 Hypertensive chronic kidney disease with stage 1 through stage 4 chronic kidney disease, or unspecified chronic kidney disease: Secondary | ICD-10-CM | POA: Diagnosis not present

## 2021-10-27 DIAGNOSIS — E1142 Type 2 diabetes mellitus with diabetic polyneuropathy: Secondary | ICD-10-CM | POA: Diagnosis not present

## 2021-10-27 DIAGNOSIS — H43813 Vitreous degeneration, bilateral: Secondary | ICD-10-CM | POA: Diagnosis not present

## 2021-10-27 DIAGNOSIS — Z435 Encounter for attention to cystostomy: Secondary | ICD-10-CM | POA: Diagnosis not present

## 2021-10-27 DIAGNOSIS — N35919 Unspecified urethral stricture, male, unspecified site: Secondary | ICD-10-CM | POA: Diagnosis not present

## 2021-10-27 DIAGNOSIS — H353122 Nonexudative age-related macular degeneration, left eye, intermediate dry stage: Secondary | ICD-10-CM | POA: Diagnosis not present

## 2021-10-27 DIAGNOSIS — H35373 Puckering of macula, bilateral: Secondary | ICD-10-CM | POA: Diagnosis not present

## 2021-10-27 DIAGNOSIS — E1122 Type 2 diabetes mellitus with diabetic chronic kidney disease: Secondary | ICD-10-CM | POA: Diagnosis not present

## 2021-10-27 DIAGNOSIS — H353211 Exudative age-related macular degeneration, right eye, with active choroidal neovascularization: Secondary | ICD-10-CM | POA: Diagnosis not present

## 2021-10-27 DIAGNOSIS — E1165 Type 2 diabetes mellitus with hyperglycemia: Secondary | ICD-10-CM | POA: Diagnosis not present

## 2021-10-28 ENCOUNTER — Other Ambulatory Visit: Payer: Self-pay | Admitting: Family Medicine

## 2021-10-28 DIAGNOSIS — N35919 Unspecified urethral stricture, male, unspecified site: Secondary | ICD-10-CM | POA: Diagnosis not present

## 2021-10-28 DIAGNOSIS — Z435 Encounter for attention to cystostomy: Secondary | ICD-10-CM | POA: Diagnosis not present

## 2021-10-28 DIAGNOSIS — E1142 Type 2 diabetes mellitus with diabetic polyneuropathy: Secondary | ICD-10-CM | POA: Diagnosis not present

## 2021-10-28 DIAGNOSIS — I129 Hypertensive chronic kidney disease with stage 1 through stage 4 chronic kidney disease, or unspecified chronic kidney disease: Secondary | ICD-10-CM | POA: Diagnosis not present

## 2021-10-28 DIAGNOSIS — E1122 Type 2 diabetes mellitus with diabetic chronic kidney disease: Secondary | ICD-10-CM | POA: Diagnosis not present

## 2021-10-28 DIAGNOSIS — E1165 Type 2 diabetes mellitus with hyperglycemia: Secondary | ICD-10-CM | POA: Diagnosis not present

## 2021-11-03 DIAGNOSIS — E1122 Type 2 diabetes mellitus with diabetic chronic kidney disease: Secondary | ICD-10-CM | POA: Diagnosis not present

## 2021-11-03 DIAGNOSIS — N35919 Unspecified urethral stricture, male, unspecified site: Secondary | ICD-10-CM | POA: Diagnosis not present

## 2021-11-03 DIAGNOSIS — E1165 Type 2 diabetes mellitus with hyperglycemia: Secondary | ICD-10-CM | POA: Diagnosis not present

## 2021-11-03 DIAGNOSIS — I129 Hypertensive chronic kidney disease with stage 1 through stage 4 chronic kidney disease, or unspecified chronic kidney disease: Secondary | ICD-10-CM | POA: Diagnosis not present

## 2021-11-03 DIAGNOSIS — E1142 Type 2 diabetes mellitus with diabetic polyneuropathy: Secondary | ICD-10-CM | POA: Diagnosis not present

## 2021-11-03 DIAGNOSIS — Z435 Encounter for attention to cystostomy: Secondary | ICD-10-CM | POA: Diagnosis not present

## 2021-11-04 DIAGNOSIS — I2510 Atherosclerotic heart disease of native coronary artery without angina pectoris: Secondary | ICD-10-CM

## 2021-11-04 DIAGNOSIS — I10 Essential (primary) hypertension: Secondary | ICD-10-CM

## 2021-11-04 DIAGNOSIS — E1140 Type 2 diabetes mellitus with diabetic neuropathy, unspecified: Secondary | ICD-10-CM

## 2021-11-04 DIAGNOSIS — E785 Hyperlipidemia, unspecified: Secondary | ICD-10-CM | POA: Diagnosis not present

## 2021-11-04 DIAGNOSIS — Z794 Long term (current) use of insulin: Secondary | ICD-10-CM

## 2021-11-06 DIAGNOSIS — E1142 Type 2 diabetes mellitus with diabetic polyneuropathy: Secondary | ICD-10-CM | POA: Diagnosis not present

## 2021-11-06 DIAGNOSIS — E1122 Type 2 diabetes mellitus with diabetic chronic kidney disease: Secondary | ICD-10-CM | POA: Diagnosis not present

## 2021-11-06 DIAGNOSIS — I129 Hypertensive chronic kidney disease with stage 1 through stage 4 chronic kidney disease, or unspecified chronic kidney disease: Secondary | ICD-10-CM | POA: Diagnosis not present

## 2021-11-06 DIAGNOSIS — N35919 Unspecified urethral stricture, male, unspecified site: Secondary | ICD-10-CM | POA: Diagnosis not present

## 2021-11-06 DIAGNOSIS — E1165 Type 2 diabetes mellitus with hyperglycemia: Secondary | ICD-10-CM | POA: Diagnosis not present

## 2021-11-06 DIAGNOSIS — Z435 Encounter for attention to cystostomy: Secondary | ICD-10-CM | POA: Diagnosis not present

## 2021-11-10 DIAGNOSIS — E1142 Type 2 diabetes mellitus with diabetic polyneuropathy: Secondary | ICD-10-CM | POA: Diagnosis not present

## 2021-11-10 DIAGNOSIS — E1165 Type 2 diabetes mellitus with hyperglycemia: Secondary | ICD-10-CM | POA: Diagnosis not present

## 2021-11-10 DIAGNOSIS — I129 Hypertensive chronic kidney disease with stage 1 through stage 4 chronic kidney disease, or unspecified chronic kidney disease: Secondary | ICD-10-CM | POA: Diagnosis not present

## 2021-11-10 DIAGNOSIS — E1122 Type 2 diabetes mellitus with diabetic chronic kidney disease: Secondary | ICD-10-CM | POA: Diagnosis not present

## 2021-11-10 DIAGNOSIS — Z435 Encounter for attention to cystostomy: Secondary | ICD-10-CM | POA: Diagnosis not present

## 2021-11-10 DIAGNOSIS — N35919 Unspecified urethral stricture, male, unspecified site: Secondary | ICD-10-CM | POA: Diagnosis not present

## 2021-11-11 ENCOUNTER — Ambulatory Visit: Payer: Medicare Other | Admitting: *Deleted

## 2021-11-11 DIAGNOSIS — G4733 Obstructive sleep apnea (adult) (pediatric): Secondary | ICD-10-CM | POA: Diagnosis not present

## 2021-11-11 DIAGNOSIS — E113211 Type 2 diabetes mellitus with mild nonproliferative diabetic retinopathy with macular edema, right eye: Secondary | ICD-10-CM | POA: Diagnosis not present

## 2021-11-11 DIAGNOSIS — I2510 Atherosclerotic heart disease of native coronary artery without angina pectoris: Secondary | ICD-10-CM | POA: Diagnosis not present

## 2021-11-11 DIAGNOSIS — E1122 Type 2 diabetes mellitus with diabetic chronic kidney disease: Secondary | ICD-10-CM | POA: Diagnosis not present

## 2021-11-11 DIAGNOSIS — Z8673 Personal history of transient ischemic attack (TIA), and cerebral infarction without residual deficits: Secondary | ICD-10-CM | POA: Diagnosis not present

## 2021-11-11 DIAGNOSIS — Z435 Encounter for attention to cystostomy: Secondary | ICD-10-CM | POA: Diagnosis not present

## 2021-11-11 DIAGNOSIS — E1142 Type 2 diabetes mellitus with diabetic polyneuropathy: Secondary | ICD-10-CM | POA: Diagnosis not present

## 2021-11-11 DIAGNOSIS — E1140 Type 2 diabetes mellitus with diabetic neuropathy, unspecified: Secondary | ICD-10-CM

## 2021-11-11 DIAGNOSIS — R4189 Other symptoms and signs involving cognitive functions and awareness: Secondary | ICD-10-CM | POA: Diagnosis not present

## 2021-11-11 DIAGNOSIS — I129 Hypertensive chronic kidney disease with stage 1 through stage 4 chronic kidney disease, or unspecified chronic kidney disease: Secondary | ICD-10-CM | POA: Diagnosis not present

## 2021-11-11 DIAGNOSIS — N1832 Chronic kidney disease, stage 3b: Secondary | ICD-10-CM | POA: Diagnosis not present

## 2021-11-11 DIAGNOSIS — E785 Hyperlipidemia, unspecified: Secondary | ICD-10-CM | POA: Diagnosis not present

## 2021-11-11 DIAGNOSIS — E1165 Type 2 diabetes mellitus with hyperglycemia: Secondary | ICD-10-CM | POA: Diagnosis not present

## 2021-11-11 DIAGNOSIS — Z8546 Personal history of malignant neoplasm of prostate: Secondary | ICD-10-CM | POA: Diagnosis not present

## 2021-11-11 DIAGNOSIS — N35919 Unspecified urethral stricture, male, unspecified site: Secondary | ICD-10-CM | POA: Diagnosis not present

## 2021-11-11 DIAGNOSIS — Z794 Long term (current) use of insulin: Secondary | ICD-10-CM | POA: Diagnosis not present

## 2021-11-11 NOTE — Patient Instructions (Signed)
Visit Information  Thank you for taking time to visit with me today. Please don't hesitate to contact me if I can be of assistance to you before our next scheduled telephone appointment.  Following are the goals we discussed today:  Patient will self administer medications as prescribed Patient will attend all scheduled provider appointments Patient will call provider office for new concerns or questions Continue to work with home health RN/ PT/ OT- complete exercises as prescribed Drink adequate fluids, preferably water Follow heart healthy diet- avoid trans/ saturated fats Follow RULE OF 15 for low blood sugar management:  How to treat low blood sugars (Blood sugar less than 70 mg/dl  Please follow the RULE OF 15 for the treatment of hypoglycemia treatment (When your blood sugars are less than 70 mg/ dl) STEP  1:  Take 15 grams of carbohydrates when your blood sugar is low, which includes:   3-4 glucose tabs or  3-4 oz of juice or regular soda or  One tube of glucose gel STEP 2:  Recheck blood sugar in 15 minutes STEP 3:  If your blood sugar is still low at the 15 minute recheck ---then, go back to STEP 1 and treat again with another 15 grams of carbohydrates  Our next appointment is by telephone on 12/16/2021 at 1045 am  Please call the care guide team at [phone] if you need to cancel or reschedule your appointment.   If you are experiencing a Mental Health or [employer] or need someone to talk to, please call the [place] National Suicide Prevention Lifeline: 610-121-7957 or TTY: 570 008 7048 TTY [phone]) to talk to a trained counselor call 1-800-273-TALK (toll free, 24 hour hotline) go to [facility] [address] [place] [phone]) call the [place]: [phone] call 911   The patient verbalized understanding of instructions, educational materials, and care plan provided today and declined  offer to receive copy of patient instructions, educational materials, and care plan.   Jacqlyn Larsen [facility], BSN RN Case Manager [facility] [phone]

## 2021-11-11 NOTE — Chronic Care Management (AMB) (Signed)
Chronic Care Management   CCM RN Visit Note  11/11/2021 Name: Luke Solis MRN: [id number] DOB: 09/19/1931  Subjective: Luke Solis is a 87 y.o. year old male who is a primary care patient of [name], [name], MD. The care management team was consulted for assistance with disease management and care coordination needs.    Engaged with patient by telephone for follow up visit in response to provider referral for case management and/or care coordination services.   Consent to Services:  The patient was given information about Chronic Care Management services, agreed to services, and gave verbal consent prior to initiation of services.  Please see initial visit note for detailed documentation.   Patient agreed to services and verbal consent obtained.   Assessment: Review of patient past medical history, allergies, medications, health status, including review of consultants reports, laboratory and other test data, was performed as part of comprehensive evaluation and provision of chronic care management services.   SDOH (Social Determinants of Health) assessments and interventions performed:    CCM Care Plan  Allergies  Allergen Reactions   Broccoli [Brassica Oleracea] Swelling    Patient states causes swelling in throat.   Celery Oil Nausea And Vomiting    Outpatient Encounter Medications as of 11/11/2021  Medication Sig   amLODipine (NORVASC) 2.5 MG tablet Take 1 tablet (2.5 mg total) by mouth daily.   benzonatate (TESSALON) 200 MG capsule Take 1 capsule (200 mg total) by mouth 3 (three) times daily as needed for cough.   Calcium Carbonate-Vitamin D 600-400 MG-UNIT per tablet Take 1 tablet by mouth daily.   Continuous Blood Gluc Receiver (FREESTYLE LIBRE 14 DAY READER) DEVI On insulin shots 4 times a day Dx E11.9   Continuous Blood Gluc Sensor (FREESTYLE LIBRE 14 DAY SENSOR) MISC On insulin 4 times daily type 2 dm on insulin   fexofenadine (ALLEGRA) 180 MG tablet Take 1 tablet (180  mg total) by mouth daily as needed for allergies.   FORACARE PREMIUM V10 TEST test strip USE AS DIRECTED.   GLOBAL EASE INJECT PEN NEEDLES 32G X 4 MM MISC USE TO INJECT LANTUS EVERY EVENING AND HUMALOG THREE (3) TIMES DAILY.   insulin glargine (LANTUS SOLOSTAR) 100 UNIT/ML Solostar Pen Inject 20 Units into the skin at bedtime. (Patient taking differently: Inject 18 Units into the skin at bedtime.)   insulin lispro (HUMALOG KWIKPEN) 100 UNIT/ML KwikPen Inject 5 Units into the skin 3 (three) times daily with meals. Under 100-no units 101-175-3 units 176-250-4 units 251-350-5 units 351-450-6 units-call provider [phone] units-call provider Greater than 525 call providerGive only if eats 50% or more of the meal.   ketoconazole (NIZORAL) 2 % cream APPLY THIN AMOUNT TWICE DAILY TO INFLAMMED AREA OF GENITALS.   Multiple Vitamin (MULTIVITAMIN) tablet Take 1 tablet by mouth daily.   mupirocin ointment (BACTROBAN) 2 % Apply small amount to suprapubic catheter site when irritated 3 times daily as needed   nitroGLYCERIN (NITROSTAT) 0.4 MG SL tablet Place 1 tablet (0.4 mg total) under the tongue every 5 (five) minutes as needed for chest pain.   potassium chloride (KLOR-CON) 10 MEQ tablet TAKE 1 TABLET MONDAY, WEDNESDAY & FRIDAY   pregabalin (LYRICA) 25 MG capsule Take 1 capsule (25 mg total) by mouth at bedtime as needed (neuropathy pain). 1 in the morning 2 in the evening   rosuvastatin (CRESTOR) 5 MG tablet TAKE 1 TABLET ONCE DAILY.   No facility-administered encounter medications on file as of 11/11/2021.    Patient  Active Problem List   Diagnosis Date Noted   COVID 09/26/2021   CKD (chronic kidney disease) stage 3, GFR 30-59 ml/min (HCC) 06/09/2021   UTI (urinary tract infection) 06/08/2021   Hypokalemia 06/08/2021   Leukocytosis 06/08/2021   Hyperglycemia due to diabetes mellitus ([place]) 06/08/2021   Chronic kidney disease 06/08/2021   Poorly controlled type 2 diabetes mellitus with peripheral  neuropathy ([place]) 08/11/2019   OSA on CPAP 09/05/2018   Altered mental status 02/28/2018   Anemia 02/28/2018   Fatigue 08/03/2017   Prostate cancer ([place]) 10/22/2015   Forgetfulness 04/29/2015   Type 2 diabetes mellitus with diabetic neuropathy ([place]) 07/17/2013   Hyperlipidemia 01/19/2010   OSA (obstructive sleep apnea) 01/19/2010   Essential hypertension, benign 01/19/2010   Coronary atherosclerosis of native coronary artery 01/19/2010    Conditions to be addressed/monitored:CAD and DMII  Care Plan : RN- Care Manager Plan of Care  Updates made by Kassie Mends, RN since 11/11/2021 12:00 AM     Problem: Chronic Disease management, Education, and care coordination needs ( Diabetes II, Cardiovascular disease)   Priority: High     Long-Range Goal: Development of plan of care to address chronic disease management and care coordination needs ( Diabetes II, Cardiovascular disease)   Start Date: 10/07/2021  Expected End Date: 02/02/2022  Priority: High  Note:   Current Barriers:  Knowledge Deficits related to plan of care for management of CAD, HTN, HLD, DMII, and falls Chronic Disease Management support and education needs related to CAD, HTN, HLD, DMII, and falls HIPAA verified with patient.  Patient gave verbal permission to speak with his wife, Keyante Durio who was also on call with patient.  Patient states he is receiving PT/ OT/RN from [facility] due to weakness/ deconditioning.  He states he was in the hospital over the summer due to urinary tract infection and sepsis and became weak and fatigued.  Patient reports having 2 falls in the past year without injury. Uses walker for ambulation. He states he uses the free style libre for blood sugar monitoring. He reports today's fasting blood sugar was 118 and states blood sugars range in low 100's fasting, mid day ranges 100-150's, and later evening ranges 130-180's with occasional 200 range. Denies blood sugars <70. Patient  reports last follow up with cardiologist was 08/13/2021.  Next follow up with primary care provider is 02/12/2022. Patient reports having suprapubic catheter and is followed by urologist every 6 months.  RNCM Clinical Goal(s):  Patient will verbalize understanding of plan for management of CAD, HTN, HLD, DMII, and Falls verbalize basic understanding of  CAD, HTN, HLD, DMII, and Falls disease process and self health management plan , review of EHR and  through collaboration with RN Care manager, provider, and care team.   Interventions: 1:1 collaboration with primary care provider regarding development and update of comprehensive plan of care as evidenced by provider attestation and co-signature Inter-disciplinary care team collaboration (see longitudinal plan of care) Evaluation of current treatment plan related to  self management and patient's adherence to plan as established by provider  Diabetes Interventions:  New Goal  Assessed patient's understanding of A1c goal: <7% Provided education to patient about basic DM disease process Reviewed medications with patient and discussed importance of medication adherence Review of patient status, including review of consultants reports, relevant laboratory and other test results, and medications completed Reinforced signs/ symptoms hypo and hyperglycemia and actions to take Reviewed carbohydrate modified diet Reviewed upcoming scheduled appointments- primary care  provider 02/12/22 Lab Results  Component Value Date   HGBA1C 9.0 (H) 06/08/2021  Cardiovascular Interventions (HTN, CAD, HLD) : New Goal  Assessed understanding of CAD diagnosis Medications reviewed including medications utilized in CAD treatment plan Provided education on Importance of limiting foods high in cholesterol Counseled on importance of regular laboratory monitoring as prescribed Counseled on the importance of exercise goals with target of 150 minutes per week Reviewed Importance  of taking all medications as prescribed Reviewed Importance of attending all scheduled provider appointments Reviewed importance of working with home health RN/ PT/ OT for management of chronic health conditions, strengthening, fall prevention  Patient Goals/Self-Care Activities: Patient will self administer medications as prescribed Patient will attend all scheduled provider appointments Patient will call provider office for new concerns or questions Continue to work with home health RN/ PT/ OT- complete exercises as prescribed Drink adequate fluids, preferably water Follow heart healthy diet- avoid trans/ saturated fats Follow RULE OF 15 for low blood sugar management:  How to treat low blood sugars (Blood sugar less than 70 mg/dl  Please follow the RULE OF 15 for the treatment of hypoglycemia treatment (When your blood sugars are less than 70 mg/ dl) STEP  1:  Take 15 grams of carbohydrates when your blood sugar is low, which includes:   3-4 glucose tabs or  3-4 oz of juice or regular soda or  One tube of glucose gel STEP 2:  Recheck blood sugar in 15 minutes STEP 3:  If your blood sugar is still low at the 15 minute recheck ---then, go back to STEP 1 and treat again with another 15 grams of carbohydrates  Follow up telephone outreach - 12/16/2021       Plan:Telephone follow up appointment with care management team member scheduled for:  12/16/2021  Jacqlyn Larsen [facility], BSN RN Case Manager [place] [phone]

## 2021-11-12 DIAGNOSIS — E1142 Type 2 diabetes mellitus with diabetic polyneuropathy: Secondary | ICD-10-CM | POA: Diagnosis not present

## 2021-11-12 DIAGNOSIS — E1122 Type 2 diabetes mellitus with diabetic chronic kidney disease: Secondary | ICD-10-CM | POA: Diagnosis not present

## 2021-11-12 DIAGNOSIS — Z435 Encounter for attention to cystostomy: Secondary | ICD-10-CM | POA: Diagnosis not present

## 2021-11-12 DIAGNOSIS — N35919 Unspecified urethral stricture, male, unspecified site: Secondary | ICD-10-CM | POA: Diagnosis not present

## 2021-11-12 DIAGNOSIS — E1165 Type 2 diabetes mellitus with hyperglycemia: Secondary | ICD-10-CM | POA: Diagnosis not present

## 2021-11-12 DIAGNOSIS — I129 Hypertensive chronic kidney disease with stage 1 through stage 4 chronic kidney disease, or unspecified chronic kidney disease: Secondary | ICD-10-CM | POA: Diagnosis not present

## 2021-11-13 DIAGNOSIS — N35919 Unspecified urethral stricture, male, unspecified site: Secondary | ICD-10-CM | POA: Diagnosis not present

## 2021-11-13 DIAGNOSIS — E1122 Type 2 diabetes mellitus with diabetic chronic kidney disease: Secondary | ICD-10-CM | POA: Diagnosis not present

## 2021-11-13 DIAGNOSIS — E1165 Type 2 diabetes mellitus with hyperglycemia: Secondary | ICD-10-CM | POA: Diagnosis not present

## 2021-11-13 DIAGNOSIS — E1142 Type 2 diabetes mellitus with diabetic polyneuropathy: Secondary | ICD-10-CM | POA: Diagnosis not present

## 2021-11-13 DIAGNOSIS — I129 Hypertensive chronic kidney disease with stage 1 through stage 4 chronic kidney disease, or unspecified chronic kidney disease: Secondary | ICD-10-CM | POA: Diagnosis not present

## 2021-11-13 DIAGNOSIS — Z435 Encounter for attention to cystostomy: Secondary | ICD-10-CM | POA: Diagnosis not present

## 2021-11-19 DIAGNOSIS — Z435 Encounter for attention to cystostomy: Secondary | ICD-10-CM | POA: Diagnosis not present

## 2021-11-19 DIAGNOSIS — E1142 Type 2 diabetes mellitus with diabetic polyneuropathy: Secondary | ICD-10-CM | POA: Diagnosis not present

## 2021-11-19 DIAGNOSIS — E1165 Type 2 diabetes mellitus with hyperglycemia: Secondary | ICD-10-CM | POA: Diagnosis not present

## 2021-11-19 DIAGNOSIS — E1122 Type 2 diabetes mellitus with diabetic chronic kidney disease: Secondary | ICD-10-CM | POA: Diagnosis not present

## 2021-11-19 DIAGNOSIS — N35919 Unspecified urethral stricture, male, unspecified site: Secondary | ICD-10-CM | POA: Diagnosis not present

## 2021-11-19 DIAGNOSIS — I129 Hypertensive chronic kidney disease with stage 1 through stage 4 chronic kidney disease, or unspecified chronic kidney disease: Secondary | ICD-10-CM | POA: Diagnosis not present

## 2021-11-24 DIAGNOSIS — H43813 Vitreous degeneration, bilateral: Secondary | ICD-10-CM | POA: Diagnosis not present

## 2021-11-24 DIAGNOSIS — H353211 Exudative age-related macular degeneration, right eye, with active choroidal neovascularization: Secondary | ICD-10-CM | POA: Diagnosis not present

## 2021-11-24 DIAGNOSIS — H353122 Nonexudative age-related macular degeneration, left eye, intermediate dry stage: Secondary | ICD-10-CM | POA: Diagnosis not present

## 2021-11-24 DIAGNOSIS — H35373 Puckering of macula, bilateral: Secondary | ICD-10-CM | POA: Diagnosis not present

## 2021-11-25 DIAGNOSIS — I129 Hypertensive chronic kidney disease with stage 1 through stage 4 chronic kidney disease, or unspecified chronic kidney disease: Secondary | ICD-10-CM | POA: Diagnosis not present

## 2021-11-25 DIAGNOSIS — Z435 Encounter for attention to cystostomy: Secondary | ICD-10-CM | POA: Diagnosis not present

## 2021-11-25 DIAGNOSIS — N35919 Unspecified urethral stricture, male, unspecified site: Secondary | ICD-10-CM | POA: Diagnosis not present

## 2021-11-25 DIAGNOSIS — E1122 Type 2 diabetes mellitus with diabetic chronic kidney disease: Secondary | ICD-10-CM | POA: Diagnosis not present

## 2021-11-25 DIAGNOSIS — E1165 Type 2 diabetes mellitus with hyperglycemia: Secondary | ICD-10-CM | POA: Diagnosis not present

## 2021-11-25 DIAGNOSIS — E1142 Type 2 diabetes mellitus with diabetic polyneuropathy: Secondary | ICD-10-CM | POA: Diagnosis not present

## 2021-11-26 ENCOUNTER — Other Ambulatory Visit: Payer: Self-pay | Admitting: Family Medicine

## 2021-11-27 ENCOUNTER — Other Ambulatory Visit: Payer: Self-pay | Admitting: Family Medicine

## 2021-11-27 NOTE — Telephone Encounter (Signed)
Telephone call-voicemail not accepting messages at this time

## 2021-11-27 NOTE — Telephone Encounter (Signed)
Please verify with patient how he is currently taking this? 1 daily?  Or several per day? Previous prescription was not clear

## 2021-12-02 ENCOUNTER — Encounter: Payer: Self-pay | Admitting: Family Medicine

## 2021-12-02 ENCOUNTER — Other Ambulatory Visit: Payer: Self-pay

## 2021-12-02 ENCOUNTER — Ambulatory Visit: Payer: Medicare Other | Admitting: Family Medicine

## 2021-12-02 ENCOUNTER — Ambulatory Visit (INDEPENDENT_AMBULATORY_CARE_PROVIDER_SITE_OTHER): Payer: Medicare Other | Admitting: Family Medicine

## 2021-12-02 VITALS — BP 138/78 | HR 71 | Temp 97.7°F | Ht 74.0 in | Wt 157.0 lb

## 2021-12-02 DIAGNOSIS — E1140 Type 2 diabetes mellitus with diabetic neuropathy, unspecified: Secondary | ICD-10-CM

## 2021-12-02 DIAGNOSIS — I2510 Atherosclerotic heart disease of native coronary artery without angina pectoris: Secondary | ICD-10-CM | POA: Diagnosis not present

## 2021-12-02 DIAGNOSIS — Z794 Long term (current) use of insulin: Secondary | ICD-10-CM | POA: Diagnosis not present

## 2021-12-02 DIAGNOSIS — N390 Urinary tract infection, site not specified: Secondary | ICD-10-CM

## 2021-12-02 DIAGNOSIS — I10 Essential (primary) hypertension: Secondary | ICD-10-CM

## 2021-12-02 DIAGNOSIS — N4889 Other specified disorders of penis: Secondary | ICD-10-CM

## 2021-12-02 MED ORDER — DOXYCYCLINE HYCLATE 100 MG PO TABS: 100.0000 mg | ORAL_TABLET | Freq: Two times a day (BID) | ORAL | 0 refills | Status: DC

## 2021-12-02 NOTE — Progress Notes (Signed)
° °  Subjective:    Patient ID: Luke Solis, male    DOB: Sep 22, 1931, 87 y.o.   MRN: [id number]  HPI Pt reports pain in penis area , has been getting his suprapubic catheter changed monthly by urology We did review over his sugar readings with diabetes for the most part pretty good but recently somewhat elevated because of increased starch with the holidays  Patient denies abdominal pain does relate penile pain denies penile discharge denies fever does have occasional chills no sweats  Review of Systems     Objective:   Physical Exam  Lungs clear heart regular pulse normal extremities no edema Suprapubic looked at     Assessment & Plan:  Blood pressure decent control after setting Will treat with doxycycline to cover for the possibility of infection Will discuss case with his urologist Dr. Rosana Hoes Diabetes decent control Recent labs reviewed

## 2021-12-03 ENCOUNTER — Other Ambulatory Visit: Payer: Self-pay | Admitting: Family Medicine

## 2021-12-03 ENCOUNTER — Telehealth: Payer: Self-pay | Admitting: Family Medicine

## 2021-12-03 MED ORDER — LIDOCAINE HCL URETHRAL/MUCOSAL 2 % EX GEL: CUTANEOUS | 1 refills | Status: DC

## 2021-12-03 MED ORDER — PREGABALIN 25 MG PO CAPS: 25.0000 mg | ORAL_CAPSULE | Freq: Every day | ORAL | 5 refills | Status: DC

## 2021-12-03 NOTE — Telephone Encounter (Signed)
Prescription sent electronically to pharmacy. Wife(DPR) notified. 

## 2021-12-03 NOTE — Telephone Encounter (Signed)
Per pt wife, she stated Dr Nicki Reaper had discussed a cream/ointment to help with pain. She requested it be called in to [facility]. [phone]

## 2021-12-03 NOTE — Progress Notes (Signed)
12/03/21-left detailed message on Dr.Davis nurse voicemail

## 2021-12-03 NOTE — Telephone Encounter (Signed)
Lidocaine jelly Small tube with 1 refill Apply small amount at the end of the penis (urethral opening )q 4 prn

## 2021-12-04 DIAGNOSIS — Z435 Encounter for attention to cystostomy: Secondary | ICD-10-CM | POA: Diagnosis not present

## 2021-12-04 DIAGNOSIS — E1165 Type 2 diabetes mellitus with hyperglycemia: Secondary | ICD-10-CM | POA: Diagnosis not present

## 2021-12-04 DIAGNOSIS — E1142 Type 2 diabetes mellitus with diabetic polyneuropathy: Secondary | ICD-10-CM | POA: Diagnosis not present

## 2021-12-04 DIAGNOSIS — N35919 Unspecified urethral stricture, male, unspecified site: Secondary | ICD-10-CM | POA: Diagnosis not present

## 2021-12-04 DIAGNOSIS — E1122 Type 2 diabetes mellitus with diabetic chronic kidney disease: Secondary | ICD-10-CM | POA: Diagnosis not present

## 2021-12-04 DIAGNOSIS — I129 Hypertensive chronic kidney disease with stage 1 through stage 4 chronic kidney disease, or unspecified chronic kidney disease: Secondary | ICD-10-CM | POA: Diagnosis not present

## 2021-12-08 DIAGNOSIS — N35919 Unspecified urethral stricture, male, unspecified site: Secondary | ICD-10-CM | POA: Diagnosis not present

## 2021-12-08 DIAGNOSIS — E1142 Type 2 diabetes mellitus with diabetic polyneuropathy: Secondary | ICD-10-CM | POA: Diagnosis not present

## 2021-12-08 DIAGNOSIS — E1165 Type 2 diabetes mellitus with hyperglycemia: Secondary | ICD-10-CM | POA: Diagnosis not present

## 2021-12-08 DIAGNOSIS — E1122 Type 2 diabetes mellitus with diabetic chronic kidney disease: Secondary | ICD-10-CM | POA: Diagnosis not present

## 2021-12-08 DIAGNOSIS — Z435 Encounter for attention to cystostomy: Secondary | ICD-10-CM | POA: Diagnosis not present

## 2021-12-08 DIAGNOSIS — I129 Hypertensive chronic kidney disease with stage 1 through stage 4 chronic kidney disease, or unspecified chronic kidney disease: Secondary | ICD-10-CM | POA: Diagnosis not present

## 2021-12-09 DIAGNOSIS — N35919 Unspecified urethral stricture, male, unspecified site: Secondary | ICD-10-CM | POA: Diagnosis not present

## 2021-12-09 DIAGNOSIS — I129 Hypertensive chronic kidney disease with stage 1 through stage 4 chronic kidney disease, or unspecified chronic kidney disease: Secondary | ICD-10-CM | POA: Diagnosis not present

## 2021-12-09 DIAGNOSIS — E1142 Type 2 diabetes mellitus with diabetic polyneuropathy: Secondary | ICD-10-CM | POA: Diagnosis not present

## 2021-12-09 DIAGNOSIS — E1165 Type 2 diabetes mellitus with hyperglycemia: Secondary | ICD-10-CM | POA: Diagnosis not present

## 2021-12-09 DIAGNOSIS — E1122 Type 2 diabetes mellitus with diabetic chronic kidney disease: Secondary | ICD-10-CM | POA: Diagnosis not present

## 2021-12-09 DIAGNOSIS — Z435 Encounter for attention to cystostomy: Secondary | ICD-10-CM | POA: Diagnosis not present

## 2021-12-11 ENCOUNTER — Telehealth: Payer: Self-pay | Admitting: Family Medicine

## 2021-12-11 DIAGNOSIS — E785 Hyperlipidemia, unspecified: Secondary | ICD-10-CM | POA: Diagnosis not present

## 2021-12-11 DIAGNOSIS — Z9181 History of falling: Secondary | ICD-10-CM | POA: Diagnosis not present

## 2021-12-11 DIAGNOSIS — Z8673 Personal history of transient ischemic attack (TIA), and cerebral infarction without residual deficits: Secondary | ICD-10-CM | POA: Diagnosis not present

## 2021-12-11 DIAGNOSIS — Z8546 Personal history of malignant neoplasm of prostate: Secondary | ICD-10-CM | POA: Diagnosis not present

## 2021-12-11 DIAGNOSIS — I2510 Atherosclerotic heart disease of native coronary artery without angina pectoris: Secondary | ICD-10-CM | POA: Diagnosis not present

## 2021-12-11 DIAGNOSIS — N390 Urinary tract infection, site not specified: Secondary | ICD-10-CM | POA: Diagnosis not present

## 2021-12-11 DIAGNOSIS — I951 Orthostatic hypotension: Secondary | ICD-10-CM | POA: Diagnosis not present

## 2021-12-11 DIAGNOSIS — E1142 Type 2 diabetes mellitus with diabetic polyneuropathy: Secondary | ICD-10-CM | POA: Diagnosis not present

## 2021-12-11 DIAGNOSIS — N1832 Chronic kidney disease, stage 3b: Secondary | ICD-10-CM | POA: Diagnosis not present

## 2021-12-11 DIAGNOSIS — Z435 Encounter for attention to cystostomy: Secondary | ICD-10-CM | POA: Diagnosis not present

## 2021-12-11 DIAGNOSIS — R4189 Other symptoms and signs involving cognitive functions and awareness: Secondary | ICD-10-CM | POA: Diagnosis not present

## 2021-12-11 DIAGNOSIS — G4733 Obstructive sleep apnea (adult) (pediatric): Secondary | ICD-10-CM | POA: Diagnosis not present

## 2021-12-11 DIAGNOSIS — I129 Hypertensive chronic kidney disease with stage 1 through stage 4 chronic kidney disease, or unspecified chronic kidney disease: Secondary | ICD-10-CM | POA: Diagnosis not present

## 2021-12-11 DIAGNOSIS — E113211 Type 2 diabetes mellitus with mild nonproliferative diabetic retinopathy with macular edema, right eye: Secondary | ICD-10-CM | POA: Diagnosis not present

## 2021-12-11 DIAGNOSIS — Z794 Long term (current) use of insulin: Secondary | ICD-10-CM | POA: Diagnosis not present

## 2021-12-11 DIAGNOSIS — N35919 Unspecified urethral stricture, male, unspecified site: Secondary | ICD-10-CM | POA: Diagnosis not present

## 2021-12-11 DIAGNOSIS — E1122 Type 2 diabetes mellitus with diabetic chronic kidney disease: Secondary | ICD-10-CM | POA: Diagnosis not present

## 2021-12-11 NOTE — Telephone Encounter (Signed)
Pt wife called and wanted to know if Dr Nicki Reaper had spoken with his urologist. Pt is still not feeling any better.   [phone]

## 2021-12-11 NOTE — Telephone Encounter (Signed)
Never got a call back  Nurses please let family know we will try again Put a call into Dr Lawerance Bach urology with [facility] Call his office  Not emergent He is a patient of Dr Rosana Hoes He has a supra pubic cath and is having penile pain For Dr Rosana Hoes or his PA to call my cell number please  ( Not the urologist on call at hospital)

## 2021-12-16 ENCOUNTER — Ambulatory Visit (INDEPENDENT_AMBULATORY_CARE_PROVIDER_SITE_OTHER): Payer: Medicare Other | Admitting: *Deleted

## 2021-12-16 DIAGNOSIS — I2510 Atherosclerotic heart disease of native coronary artery without angina pectoris: Secondary | ICD-10-CM

## 2021-12-16 DIAGNOSIS — E1140 Type 2 diabetes mellitus with diabetic neuropathy, unspecified: Secondary | ICD-10-CM

## 2021-12-16 DIAGNOSIS — I129 Hypertensive chronic kidney disease with stage 1 through stage 4 chronic kidney disease, or unspecified chronic kidney disease: Secondary | ICD-10-CM | POA: Diagnosis not present

## 2021-12-16 DIAGNOSIS — N1832 Chronic kidney disease, stage 3b: Secondary | ICD-10-CM | POA: Diagnosis not present

## 2021-12-16 DIAGNOSIS — N390 Urinary tract infection, site not specified: Secondary | ICD-10-CM

## 2021-12-16 DIAGNOSIS — N35919 Unspecified urethral stricture, male, unspecified site: Secondary | ICD-10-CM

## 2021-12-16 DIAGNOSIS — E1122 Type 2 diabetes mellitus with diabetic chronic kidney disease: Secondary | ICD-10-CM | POA: Diagnosis not present

## 2021-12-16 DIAGNOSIS — I951 Orthostatic hypotension: Secondary | ICD-10-CM | POA: Diagnosis not present

## 2021-12-16 DIAGNOSIS — E1142 Type 2 diabetes mellitus with diabetic polyneuropathy: Secondary | ICD-10-CM

## 2021-12-16 DIAGNOSIS — Z435 Encounter for attention to cystostomy: Secondary | ICD-10-CM | POA: Diagnosis not present

## 2021-12-16 NOTE — Patient Instructions (Signed)
Visit Information  Thank you for taking time to visit with me today. Please don't hesitate to contact me if I can be of assistance to you before our next scheduled telephone appointment.  Following are the goals we discussed today:  Patient will self administer medications as prescribed Patient will attend all scheduled provider appointments Patient will call provider office for new concerns or questions Continue to work with home health RN/ PT- complete exercises as prescribed Drink adequate fluids, preferably water Follow heart healthy diet- avoid trans/ saturated fats, fast food fall prevention strategies: change position slowly, use assistive device such as walker or cane (per provider recommendations) when walking, keep walkways clear, have good lighting in room. It is important to contact your provider if you have any falls, maintain muscle strength/tone by exercise per provider recommendations.  Our next appointment is by telephone on 02/03/2022 at 130 pm  Please call the care guide team at [phone] if you need to cancel or reschedule your appointment.   If you are experiencing a Mental Health or [place] or need someone to talk to, please call the Suicide and Crisis Lifeline: 988 call the [place] National Suicide Prevention Lifeline: (725) 634-3698 or TTY: (445)196-3331 TTY 520-283-1958) to talk to a trained counselor call 1-800-273-TALK (toll free, 24 hour hotline) go to [facility] [address], [place] [phone]) call the Central: 2026763354 call 911   The patient verbalized understanding of instructions, educational materials, and care plan provided today and agreed to receive a mailed copy of patient instructions, educational materials, and care plan.   Jacqlyn Larsen [facility], BSN RN Case Manager [facility] [phone]

## 2021-12-16 NOTE — Chronic Care Management (AMB) (Signed)
Chronic Care Management   CCM RN Visit Note  12/16/2021 Name: Luke Solis MRN: [id number] DOB: Jul 11, 1931  Subjective: Luke Solis is a 88 y.o. year old male who is a primary care patient of [name], [name], MD. The care management team was consulted for assistance with disease management and care coordination needs.    Engaged with patient by telephone for follow up visit in response to provider referral for case management and/or care coordination services.   Consent to Services:  The patient was given information about Chronic Care Management services, agreed to services, and gave verbal consent prior to initiation of services.  Please see initial visit note for detailed documentation.   Patient agreed to services and verbal consent obtained.   Assessment: Review of patient past medical history, allergies, medications, health status, including review of consultants reports, laboratory and other test data, was performed as part of comprehensive evaluation and provision of chronic care management services.   SDOH (Social Determinants of Health) assessments and interventions performed:    CCM Care Plan  Allergies  Allergen Reactions   Broccoli [Brassica Oleracea] Swelling    Patient states causes swelling in throat.   Celery Oil Nausea And Vomiting    Outpatient Encounter Medications as of 12/16/2021  Medication Sig   amLODipine (NORVASC) 2.5 MG tablet Take 1 tablet (2.5 mg total) by mouth daily.   Calcium Carbonate-Vitamin D 600-400 MG-UNIT per tablet Take 1 tablet by mouth daily.   Continuous Blood Gluc Receiver (FREESTYLE LIBRE 14 DAY READER) DEVI On insulin shots 4 times a day Dx E11.9   Continuous Blood Gluc Sensor (FREESTYLE LIBRE 14 DAY SENSOR) MISC On insulin 4 times daily type 2 dm on insulin   fexofenadine (ALLEGRA) 180 MG tablet Take 1 tablet (180 mg total) by mouth daily as needed for allergies.   FORACARE PREMIUM V10 TEST test strip USE AS DIRECTED.   GLOBAL EASE  INJECT PEN NEEDLES 32G X 4 MM MISC USE TO INJECT LANTUS EVERY EVENING AND HUMALOG THREE (3) TIMES DAILY.   insulin glargine (LANTUS SOLOSTAR) 100 UNIT/ML Solostar Pen Inject 20 Units into the skin at bedtime. (Patient taking differently: Inject 18 Units into the skin at bedtime.)   insulin lispro (HUMALOG KWIKPEN) 100 UNIT/ML KwikPen Inject 5 Units into the skin 3 (three) times daily with meals. Under 100-no units 101-175-3 units 176-250-4 units 251-350-5 units 351-450-6 units-call provider [phone] units-call provider Greater than 525 call providerGive only if eats 50% or more of the meal.   ketoconazole (NIZORAL) 2 % cream APPLY THIN AMOUNT TWICE DAILY TO INFLAMMED AREA OF GENITALS.   lidocaine (XYLOCAINE) 2 % jelly Apply small amount at the end of the penis (urethral opening )q 4 prn   Multiple Vitamin (MULTIVITAMIN) tablet Take 1 tablet by mouth daily.   mupirocin ointment (BACTROBAN) 2 % Apply small amount to suprapubic catheter site when irritated 3 times daily as needed   nitroGLYCERIN (NITROSTAT) 0.4 MG SL tablet Place 1 tablet (0.4 mg total) under the tongue every 5 (five) minutes as needed for chest pain.   potassium chloride (KLOR-CON) 10 MEQ tablet TAKE 1 TABLET MONDAY, WEDNESDAY & FRIDAY   pregabalin (LYRICA) 25 MG capsule Take 1 capsule (25 mg total) by mouth daily.   rosuvastatin (CRESTOR) 5 MG tablet TAKE 1 TABLET ONCE DAILY.   benzonatate (TESSALON) 200 MG capsule Take 1 capsule (200 mg total) by mouth 3 (three) times daily as needed for cough. (Patient not taking: Reported on 12/16/2021)  doxycycline (VIBRA-TABS) 100 MG tablet Take 1 tablet (100 mg total) by mouth 2 (two) times daily.   pregabalin (LYRICA) 25 MG capsule Take 1 capsule (25 mg total) by mouth at bedtime as needed (neuropathy pain). 1 in the morning 2 in the evening (Patient not taking: Reported on 12/16/2021)   No facility-administered encounter medications on file as of 12/16/2021.    Patient Active Problem  List   Diagnosis Date Noted   COVID 09/26/2021   CKD (chronic kidney disease) stage 3, GFR 30-59 ml/min (HCC) 06/09/2021   UTI (urinary tract infection) 06/08/2021   Hypokalemia 06/08/2021   Leukocytosis 06/08/2021   Hyperglycemia due to diabetes mellitus ([place]) 06/08/2021   Chronic kidney disease 06/08/2021   Poorly controlled type 2 diabetes mellitus with peripheral neuropathy ([place]) 08/11/2019   OSA on CPAP 09/05/2018   Altered mental status 02/28/2018   Anemia 02/28/2018   Fatigue 08/03/2017   Prostate cancer ([place]) 10/22/2015   Forgetfulness 04/29/2015   Type 2 diabetes mellitus with diabetic neuropathy ([place]) 07/17/2013   Hyperlipidemia 01/19/2010   OSA (obstructive sleep apnea) 01/19/2010   Essential hypertension, benign 01/19/2010   Coronary atherosclerosis of native coronary artery 01/19/2010    Conditions to be addressed/monitored:CAD and DMII  Care Plan : RN- Care Manager Plan of Care  Updates made by Kassie Mends, RN since 12/16/2021 12:00 AM     Problem: Chronic Disease management, Education, and care coordination needs ( Diabetes II, Cardiovascular disease)   Priority: High     Long-Range Goal: Development of plan of care to address chronic disease management and care coordination needs ( Diabetes II, Cardiovascular disease)   Start Date: 10/07/2021  Expected End Date: 02/02/2022  Priority: High  Note:   Current Barriers:  Knowledge Deficits related to plan of care for management of CAD, HTN, HLD, DMII, and falls Chronic Disease Management support and education needs related to CAD, HTN, HLD, DMII, and falls HIPAA verified with patient.  Patient gave verbal permission to speak with his wife, Tod Abrahamsen who was also on call with patient.  Patient states he is receiving PT/ RN from [facility] due to weakness/ deconditioning.  He states he was in the hospital over the summer due to urinary tract infection and sepsis and became weak and fatigued.  Patient  reports having 2 falls in the past year without injury. Uses walker for ambulation. He states he uses the free style libre for blood sugar monitoring. He reports fasting CBG ranges 100-120's range and random readings 134-185 with one reading of 231 Denies blood sugars <70. Patient reports he is to have conference call with primary care provider today to discuss issues with penile pain with suprapubic catheter and getting appointment with urologist.  Next follow up with primary care provider is 02/12/2022.   RNCM Clinical Goal(s):  Patient will verbalize understanding of plan for management of CAD, HTN, HLD, DMII, and Falls verbalize basic understanding of  CAD, HTN, HLD, DMII, and Falls disease process and self health management plan , review of EHR and  through collaboration with RN Care manager, provider, and care team.   Interventions: 1:1 collaboration with primary care provider regarding development and update of comprehensive plan of care as evidenced by provider attestation and co-signature Inter-disciplinary care team collaboration (see longitudinal plan of care) Evaluation of current treatment plan related to  self management and patient's adherence to plan as established by provider  Diabetes Interventions:  New Goal  Assessed patient's understanding of A1c  goal: <7% Reviewed medications with patient and discussed importance of medication adherence Counseled on importance of regular laboratory monitoring as prescribed Review of patient status, including review of consultants reports, relevant laboratory and other test results, and medications completed Reinforced carbohydrate modified diet Reviewed upcoming scheduled appointments- primary care provider 02/12/22 Lab Results  Component Value Date   HGBA1C 9.0 (H) 06/08/2021  Cardiovascular Interventions (HTN, CAD, HLD) : New Goal  Assessed understanding of CAD diagnosis Medications reviewed including medications utilized in CAD treatment  plan Provided education on importance of blood pressure control in management of CAD Provided education on Importance of limiting foods high in cholesterol Counseled on the importance of exercise goals with target of 150 minutes per week Reviewed Importance of taking all medications as prescribed Reviewed Importance of attending all scheduled provider appointments Reviewed importance of working with home health RN/ PT/ for management of chronic health conditions, strengthening, fall prevention Reviewed importance of drinking adequate fluids with suprapubic catheter and preferably water Pain assessment completed  Patient Goals/Self-Care Activities: Patient will self administer medications as prescribed Patient will attend all scheduled provider appointments Patient will call provider office for new concerns or questions Continue to work with home health RN/ PT- complete exercises as prescribed Drink adequate fluids, preferably water Follow heart healthy diet- avoid trans/ saturated fats, fast food fall prevention strategies: change position slowly, use assistive device such as walker or cane (per provider recommendations) when walking, keep walkways clear, have good lighting in room. It is important to contact your provider if you have any falls, maintain muscle strength/tone by exercise per provider recommendations.  Follow up telephone outreach - 02/03/2022       Plan:Telephone follow up appointment with care management team member scheduled for:  02/03/2022  Jacqlyn Larsen [facility], BSN RN Case Manager [place] [phone]

## 2021-12-21 ENCOUNTER — Telehealth: Payer: Self-pay | Admitting: Family Medicine

## 2021-12-21 DIAGNOSIS — N1832 Chronic kidney disease, stage 3b: Secondary | ICD-10-CM | POA: Diagnosis not present

## 2021-12-21 DIAGNOSIS — N390 Urinary tract infection, site not specified: Secondary | ICD-10-CM | POA: Diagnosis not present

## 2021-12-21 DIAGNOSIS — Z435 Encounter for attention to cystostomy: Secondary | ICD-10-CM | POA: Diagnosis not present

## 2021-12-21 DIAGNOSIS — I129 Hypertensive chronic kidney disease with stage 1 through stage 4 chronic kidney disease, or unspecified chronic kidney disease: Secondary | ICD-10-CM | POA: Diagnosis not present

## 2021-12-21 DIAGNOSIS — I951 Orthostatic hypotension: Secondary | ICD-10-CM | POA: Diagnosis not present

## 2021-12-21 DIAGNOSIS — E1122 Type 2 diabetes mellitus with diabetic chronic kidney disease: Secondary | ICD-10-CM | POA: Diagnosis not present

## 2021-12-21 NOTE — Telephone Encounter (Signed)
Per Dr.Scott: Please put another call into Dr. Shann Medal office.  To have Dr. Rosana Hoes or assistant call my cell number regarding his penile pain.  Nonemergent but I would like to talk with him this week if possible.  Contacted Dr.Davis office [phone]) but they are closed today. Will need to call back on Tuesday 12/22/21.

## 2021-12-21 NOTE — Telephone Encounter (Signed)
Mickel Baas contacted and verbal OK given for PT.

## 2021-12-21 NOTE — Telephone Encounter (Signed)
Donalynn Furlong calling to request Home Health Physical Therapy. They would like to do a phone call with patient next week and then see him in the home once a week for 5 weeks. May leave message on [facility]. Please advise. Thank you  [phone]

## 2021-12-21 NOTE — Telephone Encounter (Signed)
1.  That would be fine please give verbal order  2.  Please put another call into Dr. Shann Medal office.  To have Dr. Rosana Hoes or assistant call my cell number regarding his penile pain.  Nonemergent but I would like to talk with him this week if possible

## 2021-12-22 DIAGNOSIS — N1832 Chronic kidney disease, stage 3b: Secondary | ICD-10-CM | POA: Diagnosis not present

## 2021-12-22 DIAGNOSIS — N390 Urinary tract infection, site not specified: Secondary | ICD-10-CM | POA: Diagnosis not present

## 2021-12-22 DIAGNOSIS — H43813 Vitreous degeneration, bilateral: Secondary | ICD-10-CM | POA: Diagnosis not present

## 2021-12-22 DIAGNOSIS — E1122 Type 2 diabetes mellitus with diabetic chronic kidney disease: Secondary | ICD-10-CM | POA: Diagnosis not present

## 2021-12-22 DIAGNOSIS — I951 Orthostatic hypotension: Secondary | ICD-10-CM | POA: Diagnosis not present

## 2021-12-22 DIAGNOSIS — H35373 Puckering of macula, bilateral: Secondary | ICD-10-CM | POA: Diagnosis not present

## 2021-12-22 DIAGNOSIS — Z435 Encounter for attention to cystostomy: Secondary | ICD-10-CM | POA: Diagnosis not present

## 2021-12-22 DIAGNOSIS — H353122 Nonexudative age-related macular degeneration, left eye, intermediate dry stage: Secondary | ICD-10-CM | POA: Diagnosis not present

## 2021-12-22 DIAGNOSIS — H353211 Exudative age-related macular degeneration, right eye, with active choroidal neovascularization: Secondary | ICD-10-CM | POA: Diagnosis not present

## 2021-12-22 DIAGNOSIS — I129 Hypertensive chronic kidney disease with stage 1 through stage 4 chronic kidney disease, or unspecified chronic kidney disease: Secondary | ICD-10-CM | POA: Diagnosis not present

## 2021-12-22 NOTE — Telephone Encounter (Signed)
Left message to have Dr Rosana Hoes or his assistant return Dr Bary Leriche call on Dr Bary Leriche cellphone

## 2021-12-23 NOTE — Telephone Encounter (Signed)
Informed patient's wife Metta Clines) of Dr. Bary Leriche message and instructions. Verbalized understanding

## 2021-12-23 NOTE — Telephone Encounter (Signed)
I did speak with Dr. Shann Medal office.  They stated that someone from within their office will be calling Luke Solis this week. The PA with the specialist stated that we could try a bladder spasm medicine and see if that would help but I recommend holding off on this and for seeing what neurology tries directly with the patient and if that is not helping then we could try a bladder spasm medicine.  Bladder spasm medicines can cause drowsiness as a side effect. Specialist office says that their electronic notation stated that they did try calling the patient early this month and did not get an answer.  FYI

## 2021-12-25 DIAGNOSIS — R339 Retention of urine, unspecified: Secondary | ICD-10-CM | POA: Diagnosis not present

## 2021-12-25 DIAGNOSIS — N35014 Post-traumatic urethral stricture, male, unspecified: Secondary | ICD-10-CM | POA: Diagnosis not present

## 2021-12-25 DIAGNOSIS — N3946 Mixed incontinence: Secondary | ICD-10-CM | POA: Diagnosis not present

## 2021-12-25 DIAGNOSIS — N4889 Other specified disorders of penis: Secondary | ICD-10-CM | POA: Diagnosis not present

## 2021-12-25 DIAGNOSIS — Z9359 Other cystostomy status: Secondary | ICD-10-CM | POA: Diagnosis not present

## 2021-12-25 DIAGNOSIS — N3040 Irradiation cystitis without hematuria: Secondary | ICD-10-CM | POA: Diagnosis not present

## 2021-12-25 DIAGNOSIS — C61 Malignant neoplasm of prostate: Secondary | ICD-10-CM | POA: Diagnosis not present

## 2021-12-29 ENCOUNTER — Telehealth: Payer: Self-pay | Admitting: Family Medicine

## 2021-12-29 DIAGNOSIS — Z435 Encounter for attention to cystostomy: Secondary | ICD-10-CM | POA: Diagnosis not present

## 2021-12-29 DIAGNOSIS — N390 Urinary tract infection, site not specified: Secondary | ICD-10-CM | POA: Diagnosis not present

## 2021-12-29 DIAGNOSIS — N1832 Chronic kidney disease, stage 3b: Secondary | ICD-10-CM | POA: Diagnosis not present

## 2021-12-29 DIAGNOSIS — E1122 Type 2 diabetes mellitus with diabetic chronic kidney disease: Secondary | ICD-10-CM | POA: Diagnosis not present

## 2021-12-29 DIAGNOSIS — I129 Hypertensive chronic kidney disease with stage 1 through stage 4 chronic kidney disease, or unspecified chronic kidney disease: Secondary | ICD-10-CM | POA: Diagnosis not present

## 2021-12-29 DIAGNOSIS — I951 Orthostatic hypotension: Secondary | ICD-10-CM | POA: Diagnosis not present

## 2021-12-29 NOTE — Telephone Encounter (Signed)
Front Please reach out to patient.  His daughter called stating he was still having pain and discomfort.  Looking at having to do additional test.  Please set him up for a visit with me this week.  Wherever he can be fit into open slot thank you

## 2021-12-30 DIAGNOSIS — E1140 Type 2 diabetes mellitus with diabetic neuropathy, unspecified: Secondary | ICD-10-CM | POA: Diagnosis not present

## 2021-12-30 DIAGNOSIS — M79671 Pain in right foot: Secondary | ICD-10-CM | POA: Diagnosis not present

## 2021-12-30 DIAGNOSIS — M79672 Pain in left foot: Secondary | ICD-10-CM | POA: Diagnosis not present

## 2021-12-30 DIAGNOSIS — L609 Nail disorder, unspecified: Secondary | ICD-10-CM | POA: Diagnosis not present

## 2022-01-01 ENCOUNTER — Ambulatory Visit (INDEPENDENT_AMBULATORY_CARE_PROVIDER_SITE_OTHER): Payer: Medicare Other | Admitting: Family Medicine

## 2022-01-01 ENCOUNTER — Other Ambulatory Visit: Payer: Self-pay

## 2022-01-01 VITALS — BP 134/81 | HR 81 | Temp 97.3°F | Ht 74.0 in | Wt 157.8 lb

## 2022-01-01 DIAGNOSIS — I25119 Atherosclerotic heart disease of native coronary artery with unspecified angina pectoris: Secondary | ICD-10-CM | POA: Diagnosis not present

## 2022-01-01 DIAGNOSIS — C61 Malignant neoplasm of prostate: Secondary | ICD-10-CM | POA: Diagnosis not present

## 2022-01-01 DIAGNOSIS — E1140 Type 2 diabetes mellitus with diabetic neuropathy, unspecified: Secondary | ICD-10-CM

## 2022-01-01 MED ORDER — PREGABALIN 25 MG PO CAPS: ORAL_CAPSULE | ORAL | 5 refills | Status: DC

## 2022-01-01 NOTE — Progress Notes (Signed)
° °  Subjective:    Patient ID: Luke Solis, male    DOB: 11/10/1931, 88 y.o.   MRN: [id number]  HPI Patient here for follow up pain. Patient says the pain is still the same. Dr. Rosana Hoes had prescribed URO-MP capsules 1 capsule 4 times per day as needed, up to 30 days and Nitrofurantoin 100 mg BID, patient finished the medications yesterday.  The notes from urology reviewed Patient relates significant pain discomfort in the distal penile area persistent last 10 to 15 minutes at a time several times per day also keeps him up at night denies fever chills hematuria Review of Systems     Objective:   Physical Exam  Abdomen soft lungs clear heart regular extremities no edema      Assessment & Plan:  Complex Recommend Lyrica Will increase the dose Recommend 25 mg 3 times daily   If ongoing pain and discomfort may consider CT scan of abdomen pelvis because of prostate cancer Has appointment with Dr. Rosana Hoes coming up It would be wise for him to follow-up Also consider importance of keeping sugars under good control his numbers do look good Chronic care management referral was given because of diabetes.  Would benefit from ongoing care Given his prostate cancer patient states he may at some point move urology to closer by because of distance and his age

## 2022-01-04 ENCOUNTER — Telehealth: Payer: Self-pay | Admitting: Family Medicine

## 2022-01-04 DIAGNOSIS — I129 Hypertensive chronic kidney disease with stage 1 through stage 4 chronic kidney disease, or unspecified chronic kidney disease: Secondary | ICD-10-CM | POA: Diagnosis not present

## 2022-01-04 DIAGNOSIS — N1832 Chronic kidney disease, stage 3b: Secondary | ICD-10-CM | POA: Diagnosis not present

## 2022-01-04 DIAGNOSIS — E1122 Type 2 diabetes mellitus with diabetic chronic kidney disease: Secondary | ICD-10-CM | POA: Diagnosis not present

## 2022-01-04 DIAGNOSIS — N390 Urinary tract infection, site not specified: Secondary | ICD-10-CM | POA: Diagnosis not present

## 2022-01-04 DIAGNOSIS — N35919 Unspecified urethral stricture, male, unspecified site: Secondary | ICD-10-CM | POA: Diagnosis not present

## 2022-01-04 DIAGNOSIS — Z435 Encounter for attention to cystostomy: Secondary | ICD-10-CM | POA: Diagnosis not present

## 2022-01-04 DIAGNOSIS — I951 Orthostatic hypotension: Secondary | ICD-10-CM | POA: Diagnosis not present

## 2022-01-04 NOTE — Telephone Encounter (Signed)
Pt wife called and said he would need a refill on his Lyrica since Dr Nicki Reaper increased it to taking 3 times a day. [facility] in [place].   [phone]

## 2022-01-04 NOTE — Telephone Encounter (Signed)
Prescription faxed to [facility]. Wife(DPR) notified.

## 2022-01-05 ENCOUNTER — Telehealth: Payer: Self-pay | Admitting: *Deleted

## 2022-01-05 DIAGNOSIS — I2510 Atherosclerotic heart disease of native coronary artery without angina pectoris: Secondary | ICD-10-CM | POA: Diagnosis not present

## 2022-01-05 DIAGNOSIS — E1140 Type 2 diabetes mellitus with diabetic neuropathy, unspecified: Secondary | ICD-10-CM | POA: Diagnosis not present

## 2022-01-05 MED ORDER — PREGABALIN 25 MG PO CAPS: ORAL_CAPSULE | ORAL | 5 refills | Status: DC

## 2022-01-05 NOTE — Chronic Care Management (AMB) (Signed)
°  Care Management   Note  01/05/2022 Name: DEMARI GALES MRN: [id number] DOB: 1931-08-26  Luke Solis is a 88 y.o. year old male who is a primary care patient of [name], [name], MD and is actively engaged with the care management team. I reached out to [name] by phone today to assist with scheduling an initial visit with the Pharmacist  Follow up plan: Patient declines further follow up and engagement by the care management team with Pharmacist. Appropriate care team members and provider have been notified via electronic communication. The patient has been provided with contact information for the care management team and has been advised to call with any health related questions or concerns.   [facility]  Direct Dial: [phone]

## 2022-01-06 DIAGNOSIS — E1122 Type 2 diabetes mellitus with diabetic chronic kidney disease: Secondary | ICD-10-CM | POA: Diagnosis not present

## 2022-01-06 DIAGNOSIS — N390 Urinary tract infection, site not specified: Secondary | ICD-10-CM | POA: Diagnosis not present

## 2022-01-06 DIAGNOSIS — N1832 Chronic kidney disease, stage 3b: Secondary | ICD-10-CM | POA: Diagnosis not present

## 2022-01-06 DIAGNOSIS — I951 Orthostatic hypotension: Secondary | ICD-10-CM | POA: Diagnosis not present

## 2022-01-06 DIAGNOSIS — I129 Hypertensive chronic kidney disease with stage 1 through stage 4 chronic kidney disease, or unspecified chronic kidney disease: Secondary | ICD-10-CM | POA: Diagnosis not present

## 2022-01-06 DIAGNOSIS — Z435 Encounter for attention to cystostomy: Secondary | ICD-10-CM | POA: Diagnosis not present

## 2022-01-10 DIAGNOSIS — Z794 Long term (current) use of insulin: Secondary | ICD-10-CM | POA: Diagnosis not present

## 2022-01-10 DIAGNOSIS — G4733 Obstructive sleep apnea (adult) (pediatric): Secondary | ICD-10-CM | POA: Diagnosis not present

## 2022-01-10 DIAGNOSIS — I129 Hypertensive chronic kidney disease with stage 1 through stage 4 chronic kidney disease, or unspecified chronic kidney disease: Secondary | ICD-10-CM | POA: Diagnosis not present

## 2022-01-10 DIAGNOSIS — E785 Hyperlipidemia, unspecified: Secondary | ICD-10-CM | POA: Diagnosis not present

## 2022-01-10 DIAGNOSIS — N1832 Chronic kidney disease, stage 3b: Secondary | ICD-10-CM | POA: Diagnosis not present

## 2022-01-10 DIAGNOSIS — Z8673 Personal history of transient ischemic attack (TIA), and cerebral infarction without residual deficits: Secondary | ICD-10-CM | POA: Diagnosis not present

## 2022-01-10 DIAGNOSIS — R4189 Other symptoms and signs involving cognitive functions and awareness: Secondary | ICD-10-CM | POA: Diagnosis not present

## 2022-01-10 DIAGNOSIS — Z9181 History of falling: Secondary | ICD-10-CM | POA: Diagnosis not present

## 2022-01-10 DIAGNOSIS — I2510 Atherosclerotic heart disease of native coronary artery without angina pectoris: Secondary | ICD-10-CM | POA: Diagnosis not present

## 2022-01-10 DIAGNOSIS — I951 Orthostatic hypotension: Secondary | ICD-10-CM | POA: Diagnosis not present

## 2022-01-10 DIAGNOSIS — Z8546 Personal history of malignant neoplasm of prostate: Secondary | ICD-10-CM | POA: Diagnosis not present

## 2022-01-10 DIAGNOSIS — E113211 Type 2 diabetes mellitus with mild nonproliferative diabetic retinopathy with macular edema, right eye: Secondary | ICD-10-CM | POA: Diagnosis not present

## 2022-01-10 DIAGNOSIS — N390 Urinary tract infection, site not specified: Secondary | ICD-10-CM | POA: Diagnosis not present

## 2022-01-10 DIAGNOSIS — Z435 Encounter for attention to cystostomy: Secondary | ICD-10-CM | POA: Diagnosis not present

## 2022-01-10 DIAGNOSIS — E1122 Type 2 diabetes mellitus with diabetic chronic kidney disease: Secondary | ICD-10-CM | POA: Diagnosis not present

## 2022-01-10 DIAGNOSIS — E1142 Type 2 diabetes mellitus with diabetic polyneuropathy: Secondary | ICD-10-CM | POA: Diagnosis not present

## 2022-01-10 DIAGNOSIS — N35919 Unspecified urethral stricture, male, unspecified site: Secondary | ICD-10-CM | POA: Diagnosis not present

## 2022-01-11 ENCOUNTER — Ambulatory Visit (INDEPENDENT_AMBULATORY_CARE_PROVIDER_SITE_OTHER): Payer: Medicare Other | Admitting: Family Medicine

## 2022-01-11 ENCOUNTER — Other Ambulatory Visit: Payer: Self-pay

## 2022-01-11 ENCOUNTER — Encounter: Payer: Self-pay | Admitting: Family Medicine

## 2022-01-11 VITALS — BP 178/80 | HR 75 | Temp 98.9°F | Ht 74.0 in | Wt 160.0 lb

## 2022-01-11 DIAGNOSIS — I25119 Atherosclerotic heart disease of native coronary artery with unspecified angina pectoris: Secondary | ICD-10-CM | POA: Diagnosis not present

## 2022-01-11 DIAGNOSIS — R102 Pelvic and perineal pain: Secondary | ICD-10-CM | POA: Diagnosis not present

## 2022-01-11 DIAGNOSIS — Z8546 Personal history of malignant neoplasm of prostate: Secondary | ICD-10-CM

## 2022-01-11 DIAGNOSIS — R1030 Lower abdominal pain, unspecified: Secondary | ICD-10-CM | POA: Diagnosis not present

## 2022-01-11 MED ORDER — HYDROCODONE-ACETAMINOPHEN 5-325 MG PO TABS
1.0000 | ORAL_TABLET | ORAL | 0 refills | Status: DC | PRN
Start: 2022-01-11 — End: 2022-01-13

## 2022-01-11 NOTE — Progress Notes (Addendum)
° °  Subjective:    Patient ID: Luke Solis, male    DOB: [birth year], 88 y.o.   MRN: [id number]  HPI Extreme pelvic pain , kept him up all night Has suprapubic cath in place Taking tylenol for pain Patient having progressive pelvic pain Denies fevers States when the pain is severe it does give him a slight chill Occasionally with lower abdominal discomfort No vomiting or diarrhea no bloody stools Urine has been orange in color but no obvious troubles Patient was taking Lyrica frequently 3 times daily without any success Was originally taking Lyrica for neuropathy in the feet  Review of Systems     Objective:   Physical Exam Lungs clear heart regular abdomen is soft no guarding or rebound  Patient describes the pain deep in his penis that aches gets worse then can radiate into the lower pelvis he states is just very painful sometimes 10 out of 10 other times not as severe       Assessment & Plan:  Pelvic pain This is a progressive issue that started a while back He is seeing urology again on Thursday I would recommend stat labs as well as setting up stat CAT scan.  This could be done on Tuesday I did give the patient the option of going to the ER this evening but I do not necessarily feel like he has to do that If patient has progressive troubles he is to notify us Hydrocodone 5 mg/325 for pain caution drowsiness May use 1 every 4 hours Has history of prostate cancer Will do later today at [place] this [facility] rec CT abd pelv with contrast

## 2022-01-12 ENCOUNTER — Ambulatory Visit (HOSPITAL_BASED_OUTPATIENT_CLINIC_OR_DEPARTMENT_OTHER)
Admission: RE | Admit: 2022-01-12 | Discharge: 2022-01-12 | Disposition: A | Discharge status: Home / Self Care | Payer: Medicare Other | Source: Ambulatory Visit | Attending: Family Medicine | Admitting: Family Medicine

## 2022-01-12 ENCOUNTER — Other Ambulatory Visit: Payer: Self-pay | Admitting: Family Medicine

## 2022-01-12 ENCOUNTER — Other Ambulatory Visit (HOSPITAL_COMMUNITY)
Admission: RE | Admit: 2022-01-12 | Discharge: 2022-01-12 | Disposition: A | Discharge status: Home / Self Care | Payer: Medicare Other | Source: Ambulatory Visit | Attending: Family Medicine | Admitting: Family Medicine

## 2022-01-12 DIAGNOSIS — R102 Pelvic and perineal pain: Secondary | ICD-10-CM | POA: Insufficient documentation

## 2022-01-12 DIAGNOSIS — R1030 Lower abdominal pain, unspecified: Secondary | ICD-10-CM | POA: Diagnosis not present

## 2022-01-12 DIAGNOSIS — E876 Hypokalemia: Secondary | ICD-10-CM

## 2022-01-12 DIAGNOSIS — R109 Unspecified abdominal pain: Secondary | ICD-10-CM | POA: Diagnosis not present

## 2022-01-12 DIAGNOSIS — Z8042 Family history of malignant neoplasm of prostate: Secondary | ICD-10-CM | POA: Insufficient documentation

## 2022-01-12 DIAGNOSIS — Z8546 Personal history of malignant neoplasm of prostate: Secondary | ICD-10-CM | POA: Insufficient documentation

## 2022-01-12 LAB — COMPREHENSIVE METABOLIC PANEL
ALT: 10 U/L (ref 0–44)
AST: 14 U/L — ABNORMAL LOW (ref 15–41)
Albumin: 3.6 g/dL (ref 3.5–5.0)
Alkaline Phosphatase: 79 U/L (ref 38–126)
Anion gap: 5 (ref 5–15)
BUN: 24 mg/dL — ABNORMAL HIGH (ref 8–23)
CO2: 24 mmol/L (ref 22–32)
Calcium: 8.6 mg/dL — ABNORMAL LOW (ref 8.9–10.3)
Chloride: 108 mmol/L (ref 98–111)
Creatinine, Ser: 1.36 mg/dL — ABNORMAL HIGH (ref 0.61–1.24)
GFR, Estimated: 50 mL/min — ABNORMAL LOW (ref >60)
Glucose, Bld: 198 mg/dL — ABNORMAL HIGH (ref 70–99)
Potassium: 3.1 mmol/L — ABNORMAL LOW (ref 3.5–5.1)
Sodium: 137 mmol/L (ref 135–145)
Total Bilirubin: 0.5 mg/dL (ref 0.3–1.2)
Total Protein: 7.5 g/dL (ref 6.5–8.1)

## 2022-01-12 LAB — CBC WITH DIFFERENTIAL/PLATELET
Abs Immature Granulocytes: 0.04 10*3/uL (ref 0.00–0.07)
Basophils Absolute: 0 10*3/uL (ref 0.0–0.1)
Basophils Relative: 0 %
Eosinophils Absolute: 0.6 10*3/uL — ABNORMAL HIGH (ref 0.0–0.5)
Eosinophils Relative: 5 %
HCT: 37.5 % — ABNORMAL LOW (ref 39.0–52.0)
Hemoglobin: 12.1 g/dL — ABNORMAL LOW (ref 13.0–17.0)
Immature Granulocytes: 0 %
Lymphocytes Relative: 18 %
Lymphs Abs: 1.9 10*3/uL (ref 0.7–4.0)
MCH: 29.8 pg (ref 26.0–34.0)
MCHC: 32.3 g/dL (ref 30.0–36.0)
MCV: 92.4 fL (ref 80.0–100.0)
Monocytes Absolute: 0.6 10*3/uL (ref 0.1–1.0)
Monocytes Relative: 6 %
Neutro Abs: 7.1 10*3/uL (ref 1.7–7.7)
Neutrophils Relative %: 71 %
Platelets: 253 10*3/uL (ref 150–400)
RBC: 4.06 MIL/uL — ABNORMAL LOW (ref 4.22–5.81)
RDW: 12.8 % (ref 11.5–15.5)
WBC: 10.3 10*3/uL (ref 4.0–10.5)
nRBC: 0 % (ref 0.0–0.2)

## 2022-01-12 LAB — PSA: Prostatic Specific Antigen: 0.99 ng/mL (ref 0.00–4.00)

## 2022-01-12 MED ORDER — POTASSIUM CHLORIDE ER 10 MEQ PO TBCR: EXTENDED_RELEASE_TABLET | ORAL | 0 refills | Status: DC

## 2022-01-12 MED ORDER — IOHEXOL 300 MG/ML  SOLN
80.0000 mL | Freq: Once | INTRAMUSCULAR | Status: AC | PRN
  Administered 2022-01-12: 80 mL via INTRAVENOUS

## 2022-01-12 NOTE — Addendum Note (Signed)
Addended by: Dairl Ponder on: 01/12/2022 08:36 AM   Modules accepted: Orders

## 2022-01-13 ENCOUNTER — Telehealth: Payer: Self-pay | Admitting: Family Medicine

## 2022-01-13 ENCOUNTER — Other Ambulatory Visit (HOSPITAL_COMMUNITY): Payer: Medicare Other

## 2022-01-13 ENCOUNTER — Other Ambulatory Visit: Payer: Self-pay | Admitting: Family Medicine

## 2022-01-13 ENCOUNTER — Encounter: Payer: Self-pay | Admitting: Family Medicine

## 2022-01-13 DIAGNOSIS — E1122 Type 2 diabetes mellitus with diabetic chronic kidney disease: Secondary | ICD-10-CM | POA: Diagnosis not present

## 2022-01-13 DIAGNOSIS — Z435 Encounter for attention to cystostomy: Secondary | ICD-10-CM | POA: Diagnosis not present

## 2022-01-13 DIAGNOSIS — I951 Orthostatic hypotension: Secondary | ICD-10-CM | POA: Diagnosis not present

## 2022-01-13 DIAGNOSIS — I129 Hypertensive chronic kidney disease with stage 1 through stage 4 chronic kidney disease, or unspecified chronic kidney disease: Secondary | ICD-10-CM | POA: Diagnosis not present

## 2022-01-13 DIAGNOSIS — N1832 Chronic kidney disease, stage 3b: Secondary | ICD-10-CM | POA: Diagnosis not present

## 2022-01-13 DIAGNOSIS — N390 Urinary tract infection, site not specified: Secondary | ICD-10-CM | POA: Diagnosis not present

## 2022-01-13 MED ORDER — HYDROCODONE-ACETAMINOPHEN 10-325 MG PO TABS
1.0000 | ORAL_TABLET | ORAL | 0 refills | Status: AC | PRN
Start: 2022-01-13 — End: 2022-01-18

## 2022-01-13 NOTE — Telephone Encounter (Signed)
I did discuss with daughter-Georgie-patient having severe pain 5 mg hydrocodone not helping much at all.  He has had a CT scan which did not show any tumor or growth or abscess.  Lab work looked reassuring.  Patient has appointment with the urologist on Thursday.  We will go ahead with 10 mg hydrocodone 1 every 4 to 6 hours as needed for severe pain caution drowsiness If the medication does cause drowsiness reduce it to a half a tablet every 4-6 hours  We will follow-up later this week to see how he is doing Family to give [place] feedback if any problems  It should be noted that the pregabalin/Lyrica was advised to cut back to just 1/day since it was not helping

## 2022-01-13 NOTE — Telephone Encounter (Signed)
We will provide a copy of recent lab notes CAT scan and office notes for the family to take with them when they see Dr. Rosana Hoes later on Thursday

## 2022-01-14 DIAGNOSIS — N4889 Other specified disorders of penis: Secondary | ICD-10-CM | POA: Diagnosis not present

## 2022-01-14 DIAGNOSIS — Z9359 Other cystostomy status: Secondary | ICD-10-CM | POA: Diagnosis not present

## 2022-01-14 DIAGNOSIS — G8929 Other chronic pain: Secondary | ICD-10-CM | POA: Diagnosis not present

## 2022-01-14 DIAGNOSIS — N3040 Irradiation cystitis without hematuria: Secondary | ICD-10-CM | POA: Diagnosis not present

## 2022-01-14 DIAGNOSIS — N35014 Post-traumatic urethral stricture, male, unspecified: Secondary | ICD-10-CM | POA: Diagnosis not present

## 2022-01-14 DIAGNOSIS — C61 Malignant neoplasm of prostate: Secondary | ICD-10-CM | POA: Diagnosis not present

## 2022-01-14 NOTE — Telephone Encounter (Signed)
done

## 2022-01-18 DIAGNOSIS — E876 Hypokalemia: Secondary | ICD-10-CM | POA: Diagnosis not present

## 2022-01-19 DIAGNOSIS — H35373 Puckering of macula, bilateral: Secondary | ICD-10-CM | POA: Diagnosis not present

## 2022-01-19 DIAGNOSIS — H43813 Vitreous degeneration, bilateral: Secondary | ICD-10-CM | POA: Diagnosis not present

## 2022-01-19 DIAGNOSIS — H353122 Nonexudative age-related macular degeneration, left eye, intermediate dry stage: Secondary | ICD-10-CM | POA: Diagnosis not present

## 2022-01-19 DIAGNOSIS — H353211 Exudative age-related macular degeneration, right eye, with active choroidal neovascularization: Secondary | ICD-10-CM | POA: Diagnosis not present

## 2022-01-19 LAB — BASIC METABOLIC PANEL
BUN/Creatinine Ratio: 17 (ref 10–24)
BUN: 20 mg/dL (ref 8–27)
CO2: 25 mmol/L (ref 20–29)
Calcium: 9.2 mg/dL (ref 8.6–10.2)
Chloride: 104 mmol/L (ref 96–106)
Creatinine, Ser: 1.17 mg/dL (ref 0.76–1.27)
Glucose: 164 mg/dL — ABNORMAL HIGH (ref 70–99)
Potassium: 4.1 mmol/L (ref 3.5–5.2)
Sodium: 143 mmol/L (ref 134–144)
eGFR: 60 mL/min/{1.73_m2} (ref >59)

## 2022-01-19 LAB — HM DIABETES EYE EXAM

## 2022-01-20 DIAGNOSIS — I951 Orthostatic hypotension: Secondary | ICD-10-CM | POA: Diagnosis not present

## 2022-01-20 DIAGNOSIS — E1122 Type 2 diabetes mellitus with diabetic chronic kidney disease: Secondary | ICD-10-CM | POA: Diagnosis not present

## 2022-01-20 DIAGNOSIS — I129 Hypertensive chronic kidney disease with stage 1 through stage 4 chronic kidney disease, or unspecified chronic kidney disease: Secondary | ICD-10-CM | POA: Diagnosis not present

## 2022-01-20 DIAGNOSIS — Z435 Encounter for attention to cystostomy: Secondary | ICD-10-CM | POA: Diagnosis not present

## 2022-01-20 DIAGNOSIS — N1832 Chronic kidney disease, stage 3b: Secondary | ICD-10-CM | POA: Diagnosis not present

## 2022-01-20 DIAGNOSIS — N390 Urinary tract infection, site not specified: Secondary | ICD-10-CM | POA: Diagnosis not present

## 2022-01-21 DIAGNOSIS — I951 Orthostatic hypotension: Secondary | ICD-10-CM | POA: Diagnosis not present

## 2022-01-21 DIAGNOSIS — I129 Hypertensive chronic kidney disease with stage 1 through stage 4 chronic kidney disease, or unspecified chronic kidney disease: Secondary | ICD-10-CM | POA: Diagnosis not present

## 2022-01-21 DIAGNOSIS — E1122 Type 2 diabetes mellitus with diabetic chronic kidney disease: Secondary | ICD-10-CM | POA: Diagnosis not present

## 2022-01-21 DIAGNOSIS — Z435 Encounter for attention to cystostomy: Secondary | ICD-10-CM | POA: Diagnosis not present

## 2022-01-21 DIAGNOSIS — N390 Urinary tract infection, site not specified: Secondary | ICD-10-CM | POA: Diagnosis not present

## 2022-01-21 DIAGNOSIS — N1832 Chronic kidney disease, stage 3b: Secondary | ICD-10-CM | POA: Diagnosis not present

## 2022-01-22 ENCOUNTER — Other Ambulatory Visit: Payer: Self-pay | Admitting: Family Medicine

## 2022-01-25 ENCOUNTER — Other Ambulatory Visit: Payer: Self-pay | Admitting: Family Medicine

## 2022-01-26 DIAGNOSIS — Z435 Encounter for attention to cystostomy: Secondary | ICD-10-CM | POA: Diagnosis not present

## 2022-01-26 DIAGNOSIS — E1122 Type 2 diabetes mellitus with diabetic chronic kidney disease: Secondary | ICD-10-CM | POA: Diagnosis not present

## 2022-01-26 DIAGNOSIS — I129 Hypertensive chronic kidney disease with stage 1 through stage 4 chronic kidney disease, or unspecified chronic kidney disease: Secondary | ICD-10-CM | POA: Diagnosis not present

## 2022-01-26 DIAGNOSIS — I951 Orthostatic hypotension: Secondary | ICD-10-CM | POA: Diagnosis not present

## 2022-01-26 DIAGNOSIS — N1832 Chronic kidney disease, stage 3b: Secondary | ICD-10-CM | POA: Diagnosis not present

## 2022-01-26 DIAGNOSIS — N390 Urinary tract infection, site not specified: Secondary | ICD-10-CM | POA: Diagnosis not present

## 2022-01-27 DIAGNOSIS — Z435 Encounter for attention to cystostomy: Secondary | ICD-10-CM | POA: Diagnosis not present

## 2022-01-27 DIAGNOSIS — N1832 Chronic kidney disease, stage 3b: Secondary | ICD-10-CM | POA: Diagnosis not present

## 2022-01-27 DIAGNOSIS — I129 Hypertensive chronic kidney disease with stage 1 through stage 4 chronic kidney disease, or unspecified chronic kidney disease: Secondary | ICD-10-CM | POA: Diagnosis not present

## 2022-01-27 DIAGNOSIS — N390 Urinary tract infection, site not specified: Secondary | ICD-10-CM | POA: Diagnosis not present

## 2022-01-27 DIAGNOSIS — I951 Orthostatic hypotension: Secondary | ICD-10-CM | POA: Diagnosis not present

## 2022-01-27 DIAGNOSIS — E1122 Type 2 diabetes mellitus with diabetic chronic kidney disease: Secondary | ICD-10-CM | POA: Diagnosis not present

## 2022-02-03 ENCOUNTER — Ambulatory Visit (INDEPENDENT_AMBULATORY_CARE_PROVIDER_SITE_OTHER): Payer: Medicare Other | Admitting: *Deleted

## 2022-02-03 DIAGNOSIS — Z435 Encounter for attention to cystostomy: Secondary | ICD-10-CM | POA: Diagnosis not present

## 2022-02-03 DIAGNOSIS — N390 Urinary tract infection, site not specified: Secondary | ICD-10-CM | POA: Diagnosis not present

## 2022-02-03 DIAGNOSIS — I129 Hypertensive chronic kidney disease with stage 1 through stage 4 chronic kidney disease, or unspecified chronic kidney disease: Secondary | ICD-10-CM | POA: Diagnosis not present

## 2022-02-03 DIAGNOSIS — E1140 Type 2 diabetes mellitus with diabetic neuropathy, unspecified: Secondary | ICD-10-CM

## 2022-02-03 DIAGNOSIS — E1122 Type 2 diabetes mellitus with diabetic chronic kidney disease: Secondary | ICD-10-CM | POA: Diagnosis not present

## 2022-02-03 DIAGNOSIS — Z794 Long term (current) use of insulin: Secondary | ICD-10-CM

## 2022-02-03 DIAGNOSIS — N1832 Chronic kidney disease, stage 3b: Secondary | ICD-10-CM | POA: Diagnosis not present

## 2022-02-03 DIAGNOSIS — I951 Orthostatic hypotension: Secondary | ICD-10-CM | POA: Diagnosis not present

## 2022-02-03 DIAGNOSIS — I2510 Atherosclerotic heart disease of native coronary artery without angina pectoris: Secondary | ICD-10-CM

## 2022-02-03 NOTE — Patient Instructions (Addendum)
Visit Information ? ?Thank you for taking time to visit with me today. Please don't hesitate to contact me if I can be of assistance to you before our next scheduled telephone appointment. ? ?Following are the goals we discussed today:  ?Patient will self administer medications as prescribed ?Patient will attend all scheduled provider appointments ?Patient will call provider office for new concerns or questions ?Continue to work with home health RN/ PT- complete exercises as prescribed ?Drink adequate fluids, preferably water ?Follow heart healthy diet- avoid trans/ saturated fats, fast food ?Continue to stay in touch with home health agency and urologist for any issues with suprapubic catheter ?Talk to home health nurse about obtaining another type of leg bag  ?fall prevention strategies: change position slowly, use assistive device such as walker or cane (per provider recommendations) when walking, keep walkways clear, have good lighting in room. It is important to contact your provider if you have any falls, maintain muscle strength/tone by exercise per provider recommendations. ? ?Our next appointment is by telephone on 03/31/22 at 215 pm ? ?Please call the care guide team at [phone] if you need to cancel or reschedule your appointment.  ? ?If you are experiencing a Mental Health or [place] or need someone to talk to, please call the Suicide and Crisis Lifeline: 988 ?call the [place] National Suicide Prevention Lifeline: 661-534-7764 or TTY: 319-440-0067 TTY 8580325387) to talk to a trained counselor ?call 1-800-273-TALK (toll free, 24 hour hotline) ?go to [facility] [address] [place] [phone]) ?call the [facility]: [phone] ?call 911  ? ?The patient verbalized understanding of instructions, educational materials, and care plan provided today and declined offer to receive copy of patient instructions, educational  materials, and care plan.  ? ?Jacqlyn Larsen RNC, BSN ?RN Case Manager ?[place] ?[phone] ? ? ?

## 2022-02-03 NOTE — Chronic Care Management (AMB) (Addendum)
Chronic Care Management   CCM RN Visit Note  02/03/2022 Name: Luke Solis MRN: [id number] DOB: Mar 14, 1931  Subjective: Luke Solis is a 88 y.o. year old male who is a primary care patient of [name], [name], MD. The care management team was consulted for assistance with disease management and care coordination needs.    Engaged with patient by telephone for follow up visit in response to provider referral for case management and/or care coordination services.   Consent to Services:  The patient was given information about Chronic Care Management services, agreed to services, and gave verbal consent prior to initiation of services.  Please see initial visit note for detailed documentation.   Patient agreed to services and verbal consent obtained.   Assessment: Review of patient past medical history, allergies, medications, health status, including review of consultants reports, laboratory and other test data, was performed as part of comprehensive evaluation and provision of chronic care management services.   SDOH (Social Determinants of Health) assessments and interventions performed:    CCM Care Plan  Allergies  Allergen Reactions   Broccoli [Brassica Oleracea] Swelling    Patient states causes swelling in throat.   Celery Oil Nausea And Vomiting    Outpatient Encounter Medications as of 02/03/2022  Medication Sig   amLODipine (NORVASC) 2.5 MG tablet Take 1 tablet (2.5 mg total) by mouth daily.   Calcium Carbonate-Vitamin D 600-400 MG-UNIT per tablet Take 1 tablet by mouth daily.   Continuous Blood Gluc Receiver (FREESTYLE LIBRE 14 DAY READER) DEVI On insulin shots 4 times a day Dx E11.9   Continuous Blood Gluc Sensor (FREESTYLE LIBRE 14 DAY SENSOR) MISC On insulin 4 times daily type 2 dm on insulin   fexofenadine (ALLEGRA) 180 MG tablet Take 1 tablet (180 mg total) by mouth daily as needed for allergies.   FORACARE PREMIUM V10 TEST test strip USE AS DIRECTED.   GLOBAL EASE  INJECT PEN NEEDLES 32G X 4 MM MISC USE TO INJECT LANTUS EVERY EVENING AND HUMALOG THREE (3) TIMES DAILY.   insulin lispro (HUMALOG KWIKPEN) 100 UNIT/ML KwikPen Inject 5 Units into the skin 3 (three) times daily with meals. Under 100-no units 101-175-3 units 176-250-4 units 251-350-5 units 351-450-6 units-call provider [phone] units-call provider Greater than 525 call providerGive only if eats 50% or more of the meal.   ketoconazole (NIZORAL) 2 % cream APPLY THIN AMOUNT TWICE DAILY TO INFLAMMED AREA OF GENITALS.   LANTUS SOLOSTAR 100 UNIT/ML Solostar Pen INJECT 20 UNITS SUB-Q AT BEDTIME.   lidocaine (XYLOCAINE) 2 % jelly Apply small amount at the end of the penis (urethral opening )q 4 prn   Multiple Vitamin (MULTIVITAMIN) tablet Take 1 tablet by mouth daily.   mupirocin ointment (BACTROBAN) 2 % Apply small amount to suprapubic catheter site when irritated 3 times daily as needed   nitroGLYCERIN (NITROSTAT) 0.4 MG SL tablet Place 1 tablet (0.4 mg total) under the tongue every 5 (five) minutes as needed for chest pain.   potassium chloride (KLOR-CON) 10 MEQ tablet Take 2 tablets po today, 2 pill twice daily for Wednesday, then one tablet po daily thereafter   pregabalin (LYRICA) 25 MG capsule Take one tablet by mouth Three times a day   rosuvastatin (CRESTOR) 5 MG tablet TAKE 1 TABLET ONCE DAILY.   benzonatate (TESSALON) 200 MG capsule Take 1 capsule (200 mg total) by mouth 3 (three) times daily as needed for cough. (Patient not taking: Reported on 01/11/2022)   doxycycline (VIBRA-TABS) 100 MG  tablet Take 1 tablet (100 mg total) by mouth 2 (two) times daily. (Patient not taking: Reported on 02/03/2022)   No facility-administered encounter medications on file as of 02/03/2022.    Patient Active Problem List   Diagnosis Date Noted   COVID 09/26/2021   CKD (chronic kidney disease) stage 3, GFR 30-59 ml/min (HCC) 06/09/2021   UTI (urinary tract infection) 06/08/2021   Hypokalemia 06/08/2021    Leukocytosis 06/08/2021   Hyperglycemia due to diabetes mellitus ([place]) 06/08/2021   Chronic kidney disease 06/08/2021   Poorly controlled type 2 diabetes mellitus with peripheral neuropathy ([place]) 08/11/2019   OSA on CPAP 09/05/2018   Altered mental status 02/28/2018   Anemia 02/28/2018   Fatigue 08/03/2017   Prostate cancer ([place]) 10/22/2015   Forgetfulness 04/29/2015   Type 2 diabetes mellitus with diabetic neuropathy ([place]) 07/17/2013   Hyperlipidemia 01/19/2010   OSA (obstructive sleep apnea) 01/19/2010   Essential hypertension, benign 01/19/2010   Coronary atherosclerosis of native coronary artery 01/19/2010    Conditions to be addressed/monitored:CAD and DMII  Care Plan : RN- Care Manager Plan of Care  Updates made by Kassie Mends, RN since 02/03/2022 12:00 AM     Problem: Chronic Disease management, Education, and care coordination needs ( Diabetes II, Cardiovascular disease)   Priority: High     Long-Range Goal: Development of plan of care to address chronic disease management and care coordination needs ( Diabetes II, Cardiovascular disease)   Start Date: 10/07/2021  Expected End Date: 02/02/2022  Priority: High  Note:   Current Barriers:  Knowledge Deficits related to plan of care for management of CAD, HTN, HLD, DMII, and falls Chronic Disease Management support and education needs related to CAD, HTN, HLD, DMII, and falls HIPAA verified with patient.  Patient gave verbal permission to speak with his wife, Amado Andal who was also on call with patient.  Patient states he continues to receive PT/ RN home health services from [facility] due to weakness/ deconditioning.  He states he was in the hospital over the summer due to urinary tract infection and sepsis and became weak and fatigued, now has suprapubic catheter, had "lots of trouble with penile pain and pelvic pain" which has now subsided, wife reports the only issue with catheter now is leg bag "keeps coming  loose and falling off"  wife states she called home health agency and is awaiting call back, wife has tried taping this together but it does not work and she feels they need a new type of leg bag, spouse states there is no issue with the catheter itself at insertion site.  Patient reports having 2 falls in the past year without injury. Uses walker for ambulation. He states he uses the free style libre for blood sugar monitoring. He reports fasting CBG ranges 97-123 range and random readings 79-257. Denies blood sugars <70.   Next follow up with primary care provider is 02/11/2022.   RNCM Clinical Goal(s):  Patient will verbalize understanding of plan for management of CAD, HTN, HLD, DMII, and Falls verbalize basic understanding of  CAD, HTN, HLD, DMII, and Falls disease process and self health management plan , review of EHR and  through collaboration with RN Care manager, provider, and care team.   Interventions: 1:1 collaboration with primary care provider regarding development and update of comprehensive plan of care as evidenced by provider attestation and co-signature Inter-disciplinary care team collaboration (see longitudinal plan of care) Evaluation of current treatment plan related to  self management and patient's adherence to plan as established by provider  Diabetes Interventions:  New Goal  Assessed patient's understanding of A1c goal: <7% Reviewed medications with patient and discussed importance of medication adherence Counseled on importance of regular laboratory monitoring as prescribed Review of patient status, including review of consultants reports, relevant laboratory and other test results, and medications completed Reviewed CBG log with patient's spouse Reviewed carbohydrate modified diet Reviewed upcoming scheduled appointments- primary care provider 02/11/22 Lab Results  Component Value Date   HGBA1C 9.0 (H) 06/08/2021  Cardiovascular Interventions (HTN, CAD, HLD) : New  Goal  Assessed understanding of CAD diagnosis Medications reviewed including medications utilized in CAD treatment plan Provided education on importance of blood pressure control in management of CAD Provided education on Importance of limiting foods high in cholesterol Counseled on the importance of exercise goals with target of 150 minutes per week Reviewed Importance of taking all medications as prescribed Reviewed Importance of attending all scheduled provider appointments Reinforced importance of working with home health RN/ PT for management of chronic health conditions, strengthening, fall prevention and maintenance of suprapubic catheter Reinforced importance of drinking adequate fluids with suprapubic catheter and preferably water Pain assessment completed Encouraged spouse to ask home health nurse for another type of leg bag or maybe urologist office has another type of bag that fits better  Patient Goals/Self-Care Activities: Patient will self administer medications as prescribed Patient will attend all scheduled provider appointments Patient will call provider office for new concerns or questions Continue to work with home health RN/ PT- complete exercises as prescribed Drink adequate fluids, preferably water Follow heart healthy diet- avoid trans/ saturated fats, fast food Continue to stay in touch with home health agency and urologist for any issues with suprapubic catheter Talk to home health nurse about obtaining another type of leg bag  fall prevention strategies: change position slowly, use assistive device such as walker or cane (per provider recommendations) when walking, keep walkways clear, have good lighting in room. It is important to contact your provider if you have any falls, maintain muscle strength/tone by exercise per provider recommendations.         Plan:Telephone follow up appointment with care management team member scheduled for:  03/31/22  Jacqlyn Larsen [facility], BSN RN Case Manager [place] [phone]

## 2022-02-04 DIAGNOSIS — I129 Hypertensive chronic kidney disease with stage 1 through stage 4 chronic kidney disease, or unspecified chronic kidney disease: Secondary | ICD-10-CM | POA: Diagnosis not present

## 2022-02-04 DIAGNOSIS — Z435 Encounter for attention to cystostomy: Secondary | ICD-10-CM | POA: Diagnosis not present

## 2022-02-04 DIAGNOSIS — N390 Urinary tract infection, site not specified: Secondary | ICD-10-CM | POA: Diagnosis not present

## 2022-02-04 DIAGNOSIS — N1832 Chronic kidney disease, stage 3b: Secondary | ICD-10-CM | POA: Diagnosis not present

## 2022-02-04 DIAGNOSIS — I951 Orthostatic hypotension: Secondary | ICD-10-CM | POA: Diagnosis not present

## 2022-02-04 DIAGNOSIS — E1122 Type 2 diabetes mellitus with diabetic chronic kidney disease: Secondary | ICD-10-CM | POA: Diagnosis not present

## 2022-02-06 DIAGNOSIS — N390 Urinary tract infection, site not specified: Secondary | ICD-10-CM | POA: Diagnosis not present

## 2022-02-06 DIAGNOSIS — N1832 Chronic kidney disease, stage 3b: Secondary | ICD-10-CM | POA: Diagnosis not present

## 2022-02-06 DIAGNOSIS — I129 Hypertensive chronic kidney disease with stage 1 through stage 4 chronic kidney disease, or unspecified chronic kidney disease: Secondary | ICD-10-CM | POA: Diagnosis not present

## 2022-02-06 DIAGNOSIS — I951 Orthostatic hypotension: Secondary | ICD-10-CM | POA: Diagnosis not present

## 2022-02-06 DIAGNOSIS — Z435 Encounter for attention to cystostomy: Secondary | ICD-10-CM | POA: Diagnosis not present

## 2022-02-06 DIAGNOSIS — E1122 Type 2 diabetes mellitus with diabetic chronic kidney disease: Secondary | ICD-10-CM | POA: Diagnosis not present

## 2022-02-11 ENCOUNTER — Other Ambulatory Visit: Payer: Self-pay

## 2022-02-11 ENCOUNTER — Ambulatory Visit (INDEPENDENT_AMBULATORY_CARE_PROVIDER_SITE_OTHER): Payer: Medicare Other | Admitting: Family Medicine

## 2022-02-11 VITALS — BP 118/72 | Wt 157.2 lb

## 2022-02-11 DIAGNOSIS — Z794 Long term (current) use of insulin: Secondary | ICD-10-CM | POA: Diagnosis not present

## 2022-02-11 DIAGNOSIS — E1140 Type 2 diabetes mellitus with diabetic neuropathy, unspecified: Secondary | ICD-10-CM | POA: Diagnosis not present

## 2022-02-11 DIAGNOSIS — D72829 Elevated white blood cell count, unspecified: Secondary | ICD-10-CM

## 2022-02-11 DIAGNOSIS — I25119 Atherosclerotic heart disease of native coronary artery with unspecified angina pectoris: Secondary | ICD-10-CM

## 2022-02-11 DIAGNOSIS — K5900 Constipation, unspecified: Secondary | ICD-10-CM | POA: Diagnosis not present

## 2022-02-11 NOTE — Progress Notes (Signed)
? ?  Subjective:  ? ? Patient ID: Luke Solis, male    DOB: [birth year], 88 y.o.   MRN: [id number] ? ?HPI ?Patient doing fairly well ?He is his penile pain is cleared up ?No longer having this ?Tolerating the catheter well ?No fever chills abdominal pain vomiting ? ?He does have intermittent loose stools he wonders if this is related to medications or his diet or just the way his bowels are.  Denies any blood in his bowel movement.  In the past has been seen by gastroenterology.  Holding his weight okay ?We did discuss trying to cut back on simple sugars starches possibly this will make a difference.  Offered referral to gastroenterology family defers currently if they do not feel there is much that would be done for this they will monitor for now ? ?His diabetes been under decent control but does need repeat check.  Takes his medication denies any low sugar spells. ? ?Patient does relate a lot of fatigue tiredness in his muscles and just gives out has to use a walker intermittently or limit his walking. ? ?Denies being depressed ? ? ?Review of Systems ? ?   ?Objective:  ? Physical Exam ?General-in no acute distress ?Eyes-no discharge ?Lungs-respiratory rate normal, CTA ?CV-no murmurs,RRR ?Extremities skin warm dry no edema ?Neuro grossly normal ?Behavior normal, alert ? ?Diabetic foot exam ?Has ingrown toenails but not causing any problems currently but if they get worse podiatry will have to take care of these he does see podiatrist regular basis ? ? ?   ?Assessment & Plan:  ?1. Type 2 diabetes mellitus with diabetic neuropathy, with long-term current use of insulin ([place]) ?Diabetes decent control has neuropathy takes Lyrica once daily.  Continue current measures.  Follow-up if progressive troubles or worse. ?- Hemoglobin A1C ?- CBC with Differential ?- Comprehensive Metabolic Panel ([facility]) ? ?2. Leukocytosis, unspecified type ?Previous leukocytosis repeat CBC.  Possibility of CLL.  May or may not need oncology  hematology depending on results ?- Hemoglobin A1C ?- CBC with Differential ?- Comprehensive Metabolic Panel (CMET) ? ?3. Difficulty passing stool ?Recommend utilizing some mild MiraLAX 1/4-1/2 capful daily he states he does have bowel movements but is just difficult for him to pass them hopefully by keeping the bowel movements on the softer side it would make it easier ?- Hemoglobin A1C ?- CBC with Differential ?- Comprehensive Metabolic Panel (CMET) ? ?He is to follow-up in 3 to 4 months ?

## 2022-02-11 NOTE — Progress Notes (Signed)
? ?  Subjective:  ? ? Patient ID: Luke Solis, male    DOB: February 14, 1931, 88 y.o.   MRN: [id number] ? ?HPI ? ? ? ?Review of Systems ? ?   ?Objective:  ? Physical Exam ? ? ? ? ?   ?Assessment & Plan:  ? ? ?

## 2022-02-12 ENCOUNTER — Ambulatory Visit: Payer: Medicare Other | Admitting: Family Medicine

## 2022-02-13 LAB — HEMOGLOBIN A1C
Est. average glucose Bld gHb Est-mCnc: 157 mg/dL
Hgb A1c MFr Bld: 7.1 % — ABNORMAL HIGH (ref 4.8–5.6)

## 2022-02-13 LAB — COMPREHENSIVE METABOLIC PANEL
ALT: 9 IU/L (ref 0–44)
AST: 12 IU/L (ref 0–40)
Albumin/Globulin Ratio: 1.4 (ref 1.2–2.2)
Albumin: 4.3 g/dL (ref 3.6–4.6)
Alkaline Phosphatase: 100 IU/L (ref 44–121)
BUN/Creatinine Ratio: 18 (ref 10–24)
BUN: 27 mg/dL (ref 8–27)
Bilirubin Total: 0.2 mg/dL (ref 0.0–1.2)
CO2: 23 mmol/L (ref 20–29)
Calcium: 9.1 mg/dL (ref 8.6–10.2)
Chloride: 103 mmol/L (ref 96–106)
Creatinine, Ser: 1.46 mg/dL — ABNORMAL HIGH (ref 0.76–1.27)
Globulin, Total: 3.1 g/dL (ref 1.5–4.5)
Glucose: 205 mg/dL — ABNORMAL HIGH (ref 70–99)
Potassium: 4.5 mmol/L (ref 3.5–5.2)
Sodium: 141 mmol/L (ref 134–144)
Total Protein: 7.4 g/dL (ref 6.0–8.5)
eGFR: 46 mL/min/{1.73_m2} — ABNORMAL LOW (ref >59)

## 2022-02-13 LAB — CBC WITH DIFFERENTIAL/PLATELET
Basophils Absolute: 0 10*3/uL (ref 0.0–0.2)
Basos: 1 %
EOS (ABSOLUTE): 0.4 10*3/uL (ref 0.0–0.4)
Eos: 5 %
Hematocrit: 39.2 % (ref 37.5–51.0)
Hemoglobin: 13 g/dL (ref 13.0–17.7)
Immature Grans (Abs): 0 10*3/uL (ref 0.0–0.1)
Immature Granulocytes: 0 %
Lymphocytes Absolute: 2.3 10*3/uL (ref 0.7–3.1)
Lymphs: 28 %
MCH: 30.1 pg (ref 26.6–33.0)
MCHC: 33.2 g/dL (ref 31.5–35.7)
MCV: 91 fL (ref 79–97)
Monocytes Absolute: 0.5 10*3/uL (ref 0.1–0.9)
Monocytes: 7 %
Neutrophils Absolute: 4.9 10*3/uL (ref 1.4–7.0)
Neutrophils: 59 %
Platelets: 239 10*3/uL (ref 150–450)
RBC: 4.32 x10E6/uL (ref 4.14–5.80)
RDW: 12.3 % (ref 11.6–15.4)
WBC: 8.1 10*3/uL (ref 3.4–10.8)

## 2022-02-14 ENCOUNTER — Other Ambulatory Visit: Payer: Self-pay | Admitting: Family Medicine

## 2022-02-14 ENCOUNTER — Encounter: Payer: Self-pay | Admitting: Family Medicine

## 2022-02-14 MED ORDER — PREGABALIN 25 MG PO CAPS: ORAL_CAPSULE | ORAL | 1 refills | Status: DC

## 2022-02-15 DIAGNOSIS — Z466 Encounter for fitting and adjustment of urinary device: Secondary | ICD-10-CM | POA: Diagnosis not present

## 2022-02-15 DIAGNOSIS — R338 Other retention of urine: Secondary | ICD-10-CM | POA: Diagnosis not present

## 2022-02-16 DIAGNOSIS — H43813 Vitreous degeneration, bilateral: Secondary | ICD-10-CM | POA: Diagnosis not present

## 2022-02-16 DIAGNOSIS — H353122 Nonexudative age-related macular degeneration, left eye, intermediate dry stage: Secondary | ICD-10-CM | POA: Diagnosis not present

## 2022-02-16 DIAGNOSIS — H353211 Exudative age-related macular degeneration, right eye, with active choroidal neovascularization: Secondary | ICD-10-CM | POA: Diagnosis not present

## 2022-02-16 DIAGNOSIS — H35373 Puckering of macula, bilateral: Secondary | ICD-10-CM | POA: Diagnosis not present

## 2022-02-17 ENCOUNTER — Other Ambulatory Visit: Payer: Self-pay | Admitting: Family Medicine

## 2022-02-17 MED ORDER — PREGABALIN 25 MG PO CAPS: ORAL_CAPSULE | ORAL | 1 refills | Status: DC

## 2022-02-18 ENCOUNTER — Other Ambulatory Visit: Payer: Self-pay | Admitting: Family Medicine

## 2022-02-19 DIAGNOSIS — Z435 Encounter for attention to cystostomy: Secondary | ICD-10-CM | POA: Diagnosis not present

## 2022-02-19 DIAGNOSIS — N35919 Unspecified urethral stricture, male, unspecified site: Secondary | ICD-10-CM | POA: Diagnosis not present

## 2022-02-19 DIAGNOSIS — I951 Orthostatic hypotension: Secondary | ICD-10-CM | POA: Diagnosis not present

## 2022-02-19 DIAGNOSIS — E1122 Type 2 diabetes mellitus with diabetic chronic kidney disease: Secondary | ICD-10-CM | POA: Diagnosis not present

## 2022-02-19 DIAGNOSIS — I129 Hypertensive chronic kidney disease with stage 1 through stage 4 chronic kidney disease, or unspecified chronic kidney disease: Secondary | ICD-10-CM | POA: Diagnosis not present

## 2022-02-19 DIAGNOSIS — Z466 Encounter for fitting and adjustment of urinary device: Secondary | ICD-10-CM | POA: Diagnosis not present

## 2022-02-19 DIAGNOSIS — N1832 Chronic kidney disease, stage 3b: Secondary | ICD-10-CM | POA: Diagnosis not present

## 2022-02-23 ENCOUNTER — Other Ambulatory Visit: Payer: Self-pay

## 2022-02-23 ENCOUNTER — Ambulatory Visit (INDEPENDENT_AMBULATORY_CARE_PROVIDER_SITE_OTHER): Payer: Medicare Other

## 2022-02-23 ENCOUNTER — Telehealth: Payer: Self-pay

## 2022-02-23 VITALS — Ht 74.0 in | Wt 157.0 lb

## 2022-02-23 DIAGNOSIS — Z0000 Encounter for general adult medical examination without abnormal findings: Secondary | ICD-10-CM

## 2022-02-23 NOTE — Progress Notes (Signed)
? ?Subjective:  ? Luke Solis is a 88 y.o. male who presents for an Initial Medicare Annual Wellness Visit. ?Virtual Visit via Telephone Note ? ?I connected with  Luke Solis on 02/23/22 at  2:20 PM EDT by telephone and verified that I am speaking with the correct person using two identifiers. ? ?Location: ?Patient: HOME ?Provider: [facility] ?Persons participating in the virtual visit: patient/Nurse Health Advisor ?  ?I discussed the limitations, risks, security and privacy concerns of performing an evaluation and management service by telephone and the availability of in person appointments. The patient expressed understanding and agreed to proceed. ? ?Interactive audio and video telecommunications were attempted between this nurse and patient, however failed, due to patient having technical difficulties OR patient did not have access to video capability.  We continued and completed visit with audio only. ? ?Some vital signs may be absent or patient reported.  ? ?Luke Driver, LPN ? ?Review of Systems    ? ?Cardiac Risk Factors include: advanced age (>35mn, >>80women);diabetes mellitus;hypertension;dyslipidemia;male gender;sedentary lifestyle ? ?   ?Objective:  ?  ?Today's Vitals  ? 02/23/22 1500 02/23/22 1502  ?Weight: 157 lb (71.2 kg)   ?Height: '6\' 2"'$  (1.88 m)   ?PainSc:  0-No pain  ? ?Body mass index is 20.16 kg/m?. ? ?Advanced Directives 02/23/2022 10/07/2021 06/09/2021 02/02/2021 06/19/2020 02/13/2019 08/13/2018  ?Does Patient Have a Medical Advance Directive? Yes Yes No Yes Yes Yes Yes  ?Type of AParamedicof ACrestonLiving will HMullanLiving will - Living will - H[place]olaLiving will -  ?Does patient want to make changes to medical advance directive? - No - Patient declined - - - No - Patient declined -  ?Copy of HR[place]in Chart? No - copy requested - - - - No - copy requested -  ?Would patient like information on creating a  medical advance directive? - - No - Patient declined - - No - Patient declined -  ? ? ?Current Medications (verified) ?Outpatient Encounter Medications as of 02/23/2022  ?Medication Sig  ? amLODipine (NORVASC) 5 MG tablet Take 1 tablet by mouth daily.  ? benzonatate (TESSALON) 200 MG capsule Take 1 capsule (200 mg total) by mouth 3 (three) times daily as needed for cough.  ? Calcium Carbonate-Vitamin D 600-400 MG-UNIT per tablet Take 1 tablet by mouth daily.  ? Continuous Blood Gluc Receiver (FREESTYLE LIBRE 14 DAY READER) DEVI On insulin shots 4 times a day Dx E11.9  ? Continuous Blood Gluc Sensor (FREESTYLE LIBRE 14 DAY SENSOR) MISC On insulin 4 times daily type 2 dm on insulin  ? fexofenadine (ALLEGRA) 180 MG tablet Take 1 tablet (180 mg total) by mouth daily as needed for allergies.  ? FORACARE PREMIUM V10 TEST test strip USE AS DIRECTED.  ? GLOBAL EASE INJECT PEN NEEDLES 32G X 4 MM MISC USE TO INJECT LANTUS EVERY EVENING AND HUMALOG THREE (3) TIMES DAILY.  ? insulin lispro (HUMALOG KWIKPEN) 100 UNIT/ML KwikPen Inject 5 Units into the skin 3 (three) times daily with meals. Under 100-no units ?101-175-3 units ?176-250-4 units ?251-350-5 units ?3606-301-6units-call provider ?[phone]-call provider ?Greater than 525 call providerGive only if eats 50% or more of the meal.  ? ketoconazole (NIZORAL) 2 % cream APPLY THIN AMOUNT TWICE DAILY TO INFLAMMED AREA OF GENITALS.  ? LANTUS SOLOSTAR 100 UNIT/ML Solostar Pen INJECT 20 UNITS SUB-Q AT BEDTIME.  ? lidocaine (XYLOCAINE) 2 % jelly Apply small  amount at the end of the penis (urethral opening )q 4 prn  ? Multiple Vitamins-Minerals (PRESERVISION AREDS 2) CAPS Take 1 capsule by mouth 2 (two) times daily.  ? mupirocin ointment (BACTROBAN) 2 % Apply small amount to suprapubic catheter site when irritated 3 times daily as needed  ? nitroGLYCERIN (NITROSTAT) 0.4 MG SL tablet Place 1 tablet (0.4 mg total) under the tongue every 5 (five) minutes as needed for chest pain.   ? ofloxacin (OCUFLOX) 0.3 % ophthalmic solution   ? potassium chloride (KLOR-CON M) 10 MEQ tablet Take by mouth.  ? pregabalin (LYRICA) 50 MG capsule Take 50 mg by mouth 3 (three) times daily.  ? rosuvastatin (CRESTOR) 5 MG tablet TAKE 1 TABLET ONCE DAILY.  ? [DISCONTINUED] amLODipine (NORVASC) 2.5 MG tablet Take 1 tablet (2.5 mg total) by mouth daily.  ? [DISCONTINUED] Multiple Vitamin (MULTIVITAMIN) tablet Take 1 tablet by mouth daily.  ? [DISCONTINUED] potassium chloride (KLOR-CON) 10 MEQ tablet TAKE 1 TABLET ONCE DAILY.  ? [DISCONTINUED] pregabalin (LYRICA) 25 MG capsule Take one tablet by mouth each night  ? HYDROcodone-acetaminophen (NORCO/VICODIN) 5-325 MG tablet  (Patient not taking: Reported on 02/23/2022)  ? ?No facility-administered encounter medications on file as of 02/23/2022.  ? ? ?Allergies (verified) ?Broccoli [brassica oleracea] and Celery oil  ? ?History: ?Past Medical History:  ?Diagnosis Date  ? Cataract   ? Coronary atherosclerosis of native coronary artery   ? BMS proximal and mid RCA as well as BMS circumflex - 2002 (had residual 70% distal LAD), LVEF 60%  ? COVID   ? Diabetes mellitus without complication ([place])   ? Essential hypertension   ? Hyperlipidemia   ? Neuropathy   ? NSTEMI (non-ST elevated myocardial infarction) [facility])   ? 2002  ? Prostate cancer ([place])   ? Sleep apnea   ? On CPAP  ? Stroke [facility])   ? ?Past Surgical History:  ?Procedure Laterality Date  ? COLONOSCOPY    ? EYE SURGERY    ? INGUINAL HERNIA REPAIR    ? KNEE SURGERY    ? Patient states he has never had knee surgery  ? NOSE SURGERY    ? PROSTATE SURGERY    ? TONSILLECTOMY    ? ?Family History  ?Problem Relation Age of Onset  ? Heart attack Mother   ? Diabetes Mother   ? Coronary artery disease Father   ? Hypertension Father   ? Heart attack Father   ? Stroke Maternal Grandfather   ? Lung disease Neg Hx   ? Cancer Neg Hx   ? ?Social History  ? ?Socioeconomic History  ? Marital status: Married  ?  Spouse name: Metta Clines  ?  Number of children: 4  ? Years of education: Not on file  ? Highest education level: Not on file  ?Occupational History  ? Not on file  ?Tobacco Use  ? Smoking status: Never  ? Smokeless tobacco: Never  ?Vaping Use  ? Vaping Use: Never used  ?Substance and Sexual Activity  ? Alcohol use: No  ?  Alcohol/week: 0.0 standard drinks  ? Drug use: No  ? Sexual activity: Not on file  ?Other Topics Concern  ? Not on file  ?Social History Narrative  ? [facility] (08/03/17):  ? Originally from [facility] [place] [facility] [place] [facility]. Has always lived in [place]. 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 No pets currently. No bird or mold exposure.   ? Married x 63 years, 2023.   ? 3 daughters, 1  son.   ? ?Social Determinants of Health  ? ?Financial Resource Strain: Low Risk   ? Difficulty of Paying Living Expenses: Not hard at all  ?Food Insecurity: No Food Insecurity  ? Worried About Charity fundraiser in the Last Year: Never true  ? Ran Out of Food in the Last Year: Never true  ?Transportation Needs: No Transportation Needs  ? Lack of Transportation (Medical): No  ? Lack of Transportation (Non-Medical): No  ?Physical Activity: Insufficiently Active  ? Days of Exercise per Week: 3 days  ? Minutes of Exercise per Session: 30 min  ?Stress: No Stress Concern Present  ? Feeling of Stress : Not at all  ?Social Connections: Socially Integrated  ? Frequency of Communication with Friends and Family: More than three times a week  ? Frequency of Social Gatherings with Friends and Family: More than three times a week  ? Attends Religious Services: More than 4 times per year  ? Active Member of Clubs or Organizations: Yes  ? Attends Archivist Meetings: More than 4 times per year  ? Marital Status: Married  ? ? ?Tobacco Counseling ?Counseling given: Not Answered ? ? ?Clinical Intake: ? ?Pre-visit preparation completed: Yes ? ?Pain : No/denies pain ?Pain Score: 0-No pain ? ?  ? ?BMI - recorded: 20.16 ?Nutritional Status: BMI of 19-24   Normal ?Nutritional Risks: None ? ?How often do you need to have someone help you when you read instructions, pamphlets, or other written materials from your doctor or pharmacy?: 1 - Never ? ?Diabetic?Nutrition Ri

## 2022-02-23 NOTE — Telephone Encounter (Signed)
I would recommend no more than 3 mg of melatonin at nighttime to help with sleep.  We do not recommend other sleeping measures including Benadryl etc. because of significant risk for side effects.  Also try to minimize taking naps during the day if possible ?

## 2022-02-23 NOTE — Telephone Encounter (Signed)
Pt's wife asked, during AWV, if you can recommend something to help patient sleep. I advised Mrs. Kerman that the patient could possibly do Melatonin but that I would check with you. Thank you.  ?

## 2022-02-23 NOTE — Patient Instructions (Signed)
Mr. Stavely , ?Thank you for taking time to come for your Medicare Wellness Visit. I appreciate your ongoing commitment to your health goals. Please review the following plan we discussed and let me know if I can assist you in the future.  ? ?Screening recommendations/referrals: ?Colonoscopy: No longer required due to age.  ? ?Recommended yearly ophthalmology/optometry visit for glaucoma screening and checkup ?Recommended yearly dental visit for hygiene and checkup ? ?Vaccinations: ?Influenza vaccine: Done 08/26/2021 Repeat annually ? ?Pneumococcal vaccine: Done 08/28/2014 and 12/07/2011. ?Tdap vaccine: Done 04/22/2010 Repeat in 10 years ? ?Shingles vaccine: Done 08/16/2019. Second dose due at your convenience.  ?Covid-19: Done 10/13/2020, 01/14/2020 and 12/17/2019 ? ?Advanced directives: Please bring a copy of your health care power of attorney and living will to the office to be added to your chart at your convenience. ? ? ?Conditions/risks identified: Aim for 30 minutes of exercise or brisk walking, 6-8 glasses of water, and 5 servings of fruits and vegetables each day. ? ? ?Next appointment: Follow up in one year for your annual wellness visit. 2024. ? ?Preventive Care 48 Years and Older, Male ? ?Preventive care refers to lifestyle choices and visits with your health care provider that can promote health and wellness. ?What does preventive care include? ?A yearly physical exam. This is also called an annual well check. ?Dental exams once or twice a year. ?Routine eye exams. Ask your health care provider how often you should have your eyes checked. ?Personal lifestyle choices, including: ?Daily care of your teeth and gums. ?Regular physical activity. ?Eating a healthy diet. ?Avoiding tobacco and drug use. ?Limiting alcohol use. ?Practicing safe sex. ?Taking low doses of aspirin every day. ?Taking vitamin and mineral supplements as recommended by your health care provider. ?What happens during an annual well check? ?The  services and screenings done by your health care provider during your annual well check will depend on your age, overall health, lifestyle risk factors, and family history of disease. ?Counseling  ?Your health care provider may ask you questions about your: ?Alcohol use. ?Tobacco use. ?Drug use. ?Emotional well-being. ?Home and relationship well-being. ?Sexual activity. ?Eating habits. ?History of falls. ?Memory and ability to understand (cognition). ?Work and work Statistician. ?Screening  ?You may have the following tests or measurements: ?Height, weight, and BMI. ?Blood pressure. ?Lipid and cholesterol levels. These may be checked every 5 years, or more frequently if you are over 16 years old. ?Skin check. ?Lung cancer screening. You may have this screening every year starting at age 30 if you have a 30-pack-year history of smoking and currently smoke or have quit within the past 15 years. ?Fecal occult blood test (FOBT) of the stool. You may have this test every year starting at age 69. ?Flexible sigmoidoscopy or colonoscopy. You may have a sigmoidoscopy every 5 years or a colonoscopy every 10 years starting at age 39. ?Prostate cancer screening. Recommendations will vary depending on your family history and other risks. ?Hepatitis C blood test. ?Hepatitis B blood test. ?Sexually transmitted disease (STD) testing. ?Diabetes screening. This is done by checking your blood sugar (glucose) after you have not eaten for a while (fasting). You may have this done every 1-3 years. ?Abdominal aortic aneurysm (AAA) screening. You may need this if you are a current or former smoker. ?Osteoporosis. You may be screened starting at age 47 if you are at high risk. ?Talk with your health care provider about your test results, treatment options, and if necessary, the need for more tests. ?Vaccines  ?  Your health care provider may recommend certain vaccines, such as: ?Influenza vaccine. This is recommended every year. ?Tetanus,  diphtheria, and acellular pertussis (Tdap, Td) vaccine. You may need a Td booster every 10 years. ?Zoster vaccine. You may need this after age 54. ?Pneumococcal 13-valent conjugate (PCV13) vaccine. One dose is recommended after age 23. ?Pneumococcal polysaccharide (PPSV23) vaccine. One dose is recommended after age 52. ?Talk to your health care provider about which screenings and vaccines you need and how often you need them. ?This information is not intended to replace advice given to you by your health care provider. Make sure you discuss any questions you have with your health care provider. ?Document Released: 12/19/2015 Document Revised: 08/11/2016 Document Reviewed: 09/23/2015 ?Elsevier Interactive Patient Education ? 2017 [place]. ? ?Fall Prevention in the Home ?Falls can cause injuries. They can happen to people of all ages. There are many things you can do to make your home safe and to help prevent falls. ?What can I do on the outside of my home? ?Regularly fix the edges of walkways and driveways and fix any cracks. ?Remove anything that might make you trip as you walk through a door, such as a raised step or threshold. ?Trim any bushes or trees on the path to your home. ?Use bright outdoor lighting. ?Clear any walking paths of anything that might make someone trip, such as rocks or tools. ?Regularly check to see if handrails are loose or broken. Make sure that both sides of any steps have handrails. ?Any raised decks and porches should have guardrails on the edges. ?Have any leaves, snow, or ice cleared regularly. ?Use sand or salt on walking paths during winter. ?Clean up any spills in your garage right away. This includes oil or grease spills. ?What can I do in the bathroom? ?Use night lights. ?Install grab bars by the toilet and in the tub and shower. Do not use towel bars as grab bars. ?Use non-skid mats or decals in the tub or shower. ?If you need to sit down in the shower, use a plastic,  non-slip stool. ?Keep the floor dry. Clean up any water that spills on the floor as soon as it happens. ?Remove soap buildup in the tub or shower regularly. ?Attach bath mats securely with double-sided non-slip rug tape. ?Do not have throw rugs and other things on the floor that can make you trip. ?What can I do in the bedroom? ?Use night lights. ?Make sure that you have a light by your bed that is easy to reach. ?Do not use any sheets or blankets that are too big for your bed. They should not hang down onto the floor. ?Have a firm chair that has side arms. You can use this for support while you get dressed. ?Do not have throw rugs and other things on the floor that can make you trip. ?What can I do in the kitchen? ?Clean up any spills right away. ?Avoid walking on wet floors. ?Keep items that you use a lot in easy-to-reach places. ?If you need to reach something above you, use a strong step stool that has a grab bar. ?Keep electrical cords out of the way. ?Do not use floor polish or wax that makes floors slippery. If you must use wax, use non-skid floor wax. ?Do not have throw rugs and other things on the floor that can make you trip. ?What can I do with my stairs? ?Do not leave any items on the stairs. ?Make sure that there are  handrails on both sides of the stairs and use them. Fix handrails that are broken or loose. Make sure that handrails are as long as the stairways. ?Check any carpeting to make sure that it is firmly attached to the stairs. Fix any carpet that is loose or worn. ?Avoid having throw rugs at the top or bottom of the stairs. If you do have throw rugs, attach them to the floor with carpet tape. ?Make sure that you have a light switch at the top of the stairs and the bottom of the stairs. If you do not have them, ask someone to add them for you. ?What else can I do to help prevent falls? ?Wear shoes that: ?Do not have high heels. ?Have rubber bottoms. ?Are comfortable and fit you well. ?Are closed  at the toe. Do not wear sandals. ?If you use a stepladder: ?Make sure that it is fully opened. Do not climb a closed stepladder. ?Make sure that both sides of the stepladder are locked into place. ?Ask someone to [facility]

## 2022-03-05 DIAGNOSIS — Z794 Long term (current) use of insulin: Secondary | ICD-10-CM

## 2022-03-05 DIAGNOSIS — E1140 Type 2 diabetes mellitus with diabetic neuropathy, unspecified: Secondary | ICD-10-CM | POA: Diagnosis not present

## 2022-03-05 DIAGNOSIS — I2510 Atherosclerotic heart disease of native coronary artery without angina pectoris: Secondary | ICD-10-CM | POA: Diagnosis not present

## 2022-03-10 DIAGNOSIS — M79671 Pain in right foot: Secondary | ICD-10-CM | POA: Diagnosis not present

## 2022-03-10 DIAGNOSIS — E1140 Type 2 diabetes mellitus with diabetic neuropathy, unspecified: Secondary | ICD-10-CM | POA: Diagnosis not present

## 2022-03-10 DIAGNOSIS — L609 Nail disorder, unspecified: Secondary | ICD-10-CM | POA: Diagnosis not present

## 2022-03-10 DIAGNOSIS — M79672 Pain in left foot: Secondary | ICD-10-CM | POA: Diagnosis not present

## 2022-03-22 DIAGNOSIS — Z9359 Other cystostomy status: Secondary | ICD-10-CM | POA: Diagnosis not present

## 2022-03-22 DIAGNOSIS — Z466 Encounter for fitting and adjustment of urinary device: Secondary | ICD-10-CM | POA: Diagnosis not present

## 2022-03-23 DIAGNOSIS — H353211 Exudative age-related macular degeneration, right eye, with active choroidal neovascularization: Secondary | ICD-10-CM | POA: Diagnosis not present

## 2022-03-23 DIAGNOSIS — H353122 Nonexudative age-related macular degeneration, left eye, intermediate dry stage: Secondary | ICD-10-CM | POA: Diagnosis not present

## 2022-03-25 ENCOUNTER — Other Ambulatory Visit: Payer: Self-pay | Admitting: Family Medicine

## 2022-03-26 ENCOUNTER — Emergency Department (HOSPITAL_COMMUNITY)
Admission: EM | Admit: 2022-03-26 | Discharge: 2022-03-26 | Disposition: A | Discharge status: Home / Self Care | Payer: Medicare Other | Attending: Emergency Medicine | Admitting: Emergency Medicine

## 2022-03-26 ENCOUNTER — Encounter (HOSPITAL_COMMUNITY): Payer: Self-pay

## 2022-03-26 ENCOUNTER — Other Ambulatory Visit: Payer: Self-pay

## 2022-03-26 DIAGNOSIS — N3001 Acute cystitis with hematuria: Secondary | ICD-10-CM | POA: Insufficient documentation

## 2022-03-26 DIAGNOSIS — Z794 Long term (current) use of insulin: Secondary | ICD-10-CM | POA: Insufficient documentation

## 2022-03-26 DIAGNOSIS — N4889 Other specified disorders of penis: Secondary | ICD-10-CM | POA: Diagnosis present

## 2022-03-26 DIAGNOSIS — Z7902 Long term (current) use of antithrombotics/antiplatelets: Secondary | ICD-10-CM | POA: Insufficient documentation

## 2022-03-26 DIAGNOSIS — Z79899 Other long term (current) drug therapy: Secondary | ICD-10-CM | POA: Diagnosis not present

## 2022-03-26 LAB — URINALYSIS, ROUTINE W REFLEX MICROSCOPIC
Bilirubin Urine: NEGATIVE
Glucose, UA: >=500 mg/dL — AB
Ketones, ur: NEGATIVE mg/dL
Nitrite: NEGATIVE
Protein, ur: 100 mg/dL — AB
RBC / HPF: >50 RBC/hpf — ABNORMAL HIGH (ref 0–5)
Specific Gravity, Urine: 1.021 (ref 1.005–1.030)
WBC, UA: >50 WBC/hpf — ABNORMAL HIGH (ref 0–5)
pH: 5 (ref 5.0–8.0)

## 2022-03-26 MED ORDER — CEPHALEXIN 500 MG PO CAPS: 500.0000 mg | ORAL_CAPSULE | Freq: Two times a day (BID) | ORAL | 0 refills | Status: DC

## 2022-03-26 MED ORDER — CEPHALEXIN 500 MG PO CAPS
500.0000 mg | ORAL_CAPSULE | Freq: Once | ORAL | Status: AC
  Administered 2022-03-26: 500 mg via ORAL
  Filled 2022-03-26: qty 1

## 2022-03-26 NOTE — ED Triage Notes (Signed)
Pt arrived via POV c/o penile pain. Pt has chronic suprapubic catheter. Pt had catheter changed on Monday this week and again today. Pt urine does present cloudy. Pt afebrile in Triage but does have concern for UTI. Home health nurse spoke to Pts PCP and wanted Pt sent to ED for evaluation.  ?

## 2022-03-26 NOTE — ED Provider Notes (Signed)
?[facility] ?Provider Note ? ? ?CSN: [id number] ?Arrival date & time: 03/26/22  1957 ? ?  ? ?History ? ?Chief Complaint  ?Patient presents with  ? Groin Pain  ? ? ?Luke Solis is a 88 y.o. male.  He is here with concern for bladder infection.  He had his suprapubic catheter changed about a week ago and recently started having pain in his penis.  He does not pass any urine through his penis.  The nurse changed his catheter again today with improvement in his symptoms.  They were concerned that the urine looked infected.  He denies any fever nausea vomiting.  No hematuria noted. ? ?The history is provided by the patient.  ?Male GU Problem ?Presenting symptoms: penile pain   ?Context: spontaneously   ?Relieved by: supracatheter re-placement. ?Worsened by:  Nothing ?Ineffective treatments:  None tried ?Associated symptoms: no abdominal pain, no fever, no nausea and no vomiting   ? ?  ? ?Home Medications ?Prior to Admission medications   ?Medication Sig Start Date End Date Taking? Authorizing Provider  ?amLODipine (NORVASC) 5 MG tablet Take 1 tablet by mouth daily. 07/10/21   [provider]  ?benzonatate (TESSALON) 200 MG capsule Take 1 capsule (200 mg total) by mouth 3 (three) times daily as needed for cough. 09/25/21   Coral Spikes, DO  ?Calcium Carbonate-Vitamin D 600-400 MG-UNIT per tablet Take 1 tablet by mouth daily.    [provider]  ?Continuous Blood Gluc Receiver (FREESTYLE LIBRE 14 DAY READER) DEVI On insulin shots 4 times a day Dx E11.9 07/01/21   Kathyrn Drown, MD  ?Continuous Blood Gluc Sensor (FREESTYLE LIBRE 14 DAY SENSOR) MISC On insulin 4 times daily type 2 dm on insulin 07/01/21   [name], [name], MD  ?fexofenadine (ALLEGRA) 180 MG tablet Take 1 tablet (180 mg total) by mouth daily as needed for allergies. 06/13/21   Murlean Iba, MD  ?Imagene [place] PREMIUM V10 TEST test strip USE AS DIRECTED. 07/20/17   Mikey Kirschner, MD  ?GLOBAL EASE INJECT PEN NEEDLES 32G  X 4 MM MISC USE TO INJECT LANTUS EVERY EVENING AND HUMALOG THREE (3) TIMES DAILY. 11/02/21   Kathyrn Drown, MD  ?HYDROcodone-acetaminophen (NORCO/VICODIN) 5-325 MG tablet  01/11/22   [provider]  ?Insulin Glargine Solostar (LANTUS) 100 UNIT/ML Solostar Pen INJECT 20 UNITS SUB-Q AT BEDTIME. 03/25/22   Kathyrn Drown, MD  ?insulin lispro (HUMALOG KWIKPEN) 100 UNIT/ML KwikPen Inject 5 Units into the skin 3 (three) times daily with meals. Under 100-no units ?101-175-3 units ?176-250-4 units ?251-350-5 units ?062-376-2 units-call provider ?[phone] units-call provider ?Greater than 525 call providerGive only if eats 50% or more of the meal. 07/08/21   [name], [name], MD  ?ketoconazole (NIZORAL) 2 % cream APPLY THIN AMOUNT TWICE DAILY TO INFLAMMED AREA OF GENITALS. 02/27/21   Kathyrn Drown, MD  ?lidocaine (XYLOCAINE) 2 % jelly Apply small amount at the end of the penis (urethral opening )q 4 prn 12/03/21   Kathyrn Drown, MD  ?Multiple Vitamins-Minerals (PRESERVISION AREDS 2) CAPS Take 1 capsule by mouth 2 (two) times daily. 02/22/22   [provider]  ?mupirocin ointment (BACTROBAN) 2 % Apply small amount to suprapubic catheter site when irritated 3 times daily as needed 10/15/21   Kathyrn Drown, MD  ?nitroGLYCERIN (NITROSTAT) 0.4 MG SL tablet Place 1 tablet (0.4 mg total) under the tongue every 5 (five) minutes as needed for chest pain. 06/13/21   Wynetta Emery,  Clanford L, MD  ?ofloxacin (OCUFLOX) 0.3 % ophthalmic solution  09/24/21   [provider]  ?potassium chloride (KLOR-CON M) 10 MEQ tablet Take by mouth. 07/01/21   [provider]  ?potassium chloride (KLOR-CON) 10 MEQ tablet TAKE 1 TABLET ONCE DAILY. 03/25/22   Kathyrn Drown, MD  ?pregabalin (LYRICA) 50 MG capsule Take 50 mg by mouth 3 (three) times daily. 01/14/22   [provider]  ?rosuvastatin (CRESTOR) 5 MG tablet TAKE 1 TABLET ONCE DAILY. 11/27/21   Kathyrn Drown, MD  ?   ? ?Allergies    ?Broccoli [brassica  oleracea] and Celery oil   ? ?Review of Systems   ?Review of Systems  ?Constitutional:  Negative for fever.  ?Gastrointestinal:  Negative for abdominal pain, nausea and vomiting.  ?Genitourinary:  Positive for penile pain.  ? ?Physical Exam ?Updated Vital Signs ?BP (!) 174/78 (BP Location: Left Arm)   Pulse 68   Temp 97.9 ?F (36.6 ?C) (Oral)   Resp 18   Ht '6\' 2"'$  (1.88 m)   Wt 71.2 kg   SpO2 99%   BMI 20.16 kg/m?  ?Physical Exam ?Vitals and nursing note reviewed.  ?Constitutional:   ?   Appearance: He is well-developed.  ?HENT:  ?   Head: Normocephalic and atraumatic.  ?Eyes:  ?   Conjunctiva/sclera: Conjunctivae normal.  ?Pulmonary:  ?   Effort: Pulmonary effort is normal.  ?Abdominal:  ?   Tenderness: There is no abdominal tenderness. There is no guarding or rebound.  ?   Comments: Patient is suprapubic catheter in place that is draining cloudy urine.  His penis is circumcised without any obvious lesions noted.  ?Musculoskeletal:  ?   Cervical back: Neck supple.  ?Skin: ?   General: Skin is warm and dry.  ?Neurological:  ?   Mental Status: He is alert.  ?   GCS: GCS eye subscore is 4. GCS verbal subscore is 5. GCS motor subscore is 6.  ? ? ?ED Results / Procedures / Treatments   ?Labs ?(all labs ordered are listed, but only abnormal results are displayed) ?Labs Reviewed  ?URINALYSIS, ROUTINE W REFLEX MICROSCOPIC - Abnormal; Notable for the following components:  ?    Result Value  ? APPearance TURBID (*)   ? Glucose, UA >=500 (*)   ? Hgb urine dipstick LARGE (*)   ? Protein, ur 100 (*)   ? Leukocytes,Ua LARGE (*)   ? RBC / HPF >50 (*)   ? WBC, UA >50 (*)   ? Bacteria, UA MANY (*)   ? All other components within normal limits  ?URINE CULTURE  ? ? ?EKG ?None ? ?Radiology ?No results found. ? ?Procedures ?Procedures  ? ? ?Medications Ordered in ED ?Medications - No data to display ? ?ED Course/ Medical Decision Making/ A&P ?  ?                        ?Medical Decision Making ?Amount and/or Complexity of Data  Reviewed ?Labs: ordered. ? ?Risk ?Prescription drug management. ? ?88 year old male with chronic suprapubic catheter here for possible urine infection.  He currently has no symptoms.  Urinalysis looks infected with greater than 50 whites and reds, positive for bacteria and yeast.  Unknown if chronic colonization.  Will cover with antibiotics and culture urine.  Recommended close follow-up with PCP and his urologist.  Return instructions discussed.  No indications for admission or further work-up at this time. ? ? ? ? ? ? ? ? ?  Final Clinical Impression(s) / ED Diagnoses ?Final diagnoses:  ?Acute cystitis with hematuria  ? ? ?Rx / DC Orders ?ED Discharge Orders   ? ?      Ordered  ?  cephALEXin (KEFLEX) 500 MG capsule  2 times daily       ? 03/26/22 2248  ? ?  ?  ? ?  ? ? ?  ?Hayden Rasmussen, MD ?03/27/22 [phone] ? ?

## 2022-03-26 NOTE — Discharge Instructions (Signed)
You were seen in the emergency department for concerns of a urinary tract infection.  Your urinalysis did show signs of infection and we are sending it for culture.  We are starting you on antibiotics.  Please drink plenty of fluids.  Follow-up with your primary care doctor.  Return to the emergency department if any worsening or concerning symptoms ?

## 2022-03-29 LAB — URINE CULTURE: Culture: >=100000 — AB

## 2022-03-30 ENCOUNTER — Telehealth: Payer: Self-pay

## 2022-03-30 NOTE — Telephone Encounter (Signed)
Post ED Visit - Positive Culture Follow-up ? ?Culture report reviewed by antimicrobial stewardship pharmacist: ?[place] Team ?'[x]'$  Bertis Ruddy, Pharm.D. ?'[]'$  Heide Guile, Pharm.D., BCPS AQ-ID ?'[]'$  Parks Neptune, Pharm.D., BCPS ?'[]'$  Alycia Rossetti, Pharm.D., BCPS ?'[]'$  Leal, Pharm.D., BCPS, AAHIVP ?'[]'$  Legrand Como, Pharm.D., BCPS, AAHIVP ?'[]'$  Salome Arnt, PharmD, BCPS ?'[]'$  Johnnette Gourd, PharmD, BCPS ?'[]'$  Hughes Better, PharmD, BCPS ?'[]'$  Leeroy Cha, PharmD ?'[]'$  Laqueta Linden, PharmD, BCPS ?'[]'$  Albertina Parr, PharmD ? ?[place] Team ?'[]'$  Leodis Sias, PharmD ?'[]'$  Lindell Spar, PharmD ?'[]'$  Royetta Asal, PharmD ?'[]'$  Graylin Shiver, Rph ?'[]'$  Rema Fendt) Glennon Mac, PharmD ?'[]'$  Arlyn Dunning, PharmD ?'[]'$  Netta Cedars, PharmD ?'[]'$  Dia Sitter, PharmD ?'[]'$  Leone Haven, PharmD ?'[]'$  Gretta Arab, PharmD ?'[]'$  Theodis Shove, PharmD ?'[]'$  Peggyann Juba, PharmD ?'[]'$  Reuel Boom, PharmD ? ? ?Positive urine culture ?Treated with Cephalexin, organism sensitive to the same and no further patient follow-up is required at this time. ? ?Glennon Hamilton ?03/30/2022, 10:52 AM ?  ?

## 2022-03-31 ENCOUNTER — Ambulatory Visit (INDEPENDENT_AMBULATORY_CARE_PROVIDER_SITE_OTHER): Payer: Medicare Other | Admitting: *Deleted

## 2022-03-31 DIAGNOSIS — I2510 Atherosclerotic heart disease of native coronary artery without angina pectoris: Secondary | ICD-10-CM

## 2022-03-31 DIAGNOSIS — E1140 Type 2 diabetes mellitus with diabetic neuropathy, unspecified: Secondary | ICD-10-CM

## 2022-03-31 DIAGNOSIS — N390 Urinary tract infection, site not specified: Secondary | ICD-10-CM

## 2022-03-31 NOTE — Patient Instructions (Signed)
Visit Information ? ?Thank you for taking time to visit with me today. Please don't hesitate to contact me if I can be of assistance to you before our next scheduled telephone appointment. ? ?Following are the goals we discussed today:  ?Take all medications as prescribed ?Attend all scheduled provider appointments ?Call pharmacy for medication refills 3-7 days in advance of running out of medications ?check blood sugar at prescribed times: twice daily ?check feet daily for cuts, sores or redness ?enter blood sugar readings and medication or insulin into daily log ?take the blood sugar log to all doctor visits ?drink 6 to 8 glasses of water each day ?keep feet up while sitting ?wash and dry feet carefully every day ?wear comfortable, cotton socks ?wear comfortable, well-fitting shoes ?Continue to work with home health RN- complete exercises that were prescribed by PT now that they have discharged ?Drink adequate fluids, preferably water ?Follow heart healthy diet- avoid trans/ saturated fats, fast food ?Continue to stay in touch with home health agency and urologist for any issues with suprapubic catheter ?fall prevention strategies: change position slowly, use assistive device such as walker or cane (per provider recommendations) when walking, keep walkways clear, have good lighting in room. It is important to contact your provider if you have any falls, maintain muscle strength/tone by exercise per provider recommendations. ? ?Our next appointment is by telephone on 06/16/22 at 130 pm ? ?Please call the care guide team at [phone] if you need to cancel or reschedule your appointment.  ? ?If you are experiencing a Mental Health or [place] or need someone to talk to, please call the Suicide and Crisis Lifeline: 988 ?call the [place] National Suicide Prevention Lifeline: 5732148416 or TTY: 772-451-7515 TTY 9085883225) to talk to a trained counselor ?call 1-800-273-TALK (toll free, 24 hour  hotline) ?go to [facility] [address], [place] [phone]) ?call the [facility]: [phone] ?call 911  ? ?The patient verbalized understanding of instructions, educational materials, and care plan provided today and declined offer to receive copy of patient instructions, educational materials, and care plan.  ? ?Jacqlyn Larsen RNC, BSN ?RN Case Manager ?[place] ?[phone] ? ?

## 2022-03-31 NOTE — Chronic Care Management (AMB) (Signed)
?Chronic Care Management  ? ?CCM RN Visit Note ? ?03/31/2022 ?Name: Luke Solis MRN: [id number] DOB: November 12, 1931 ? ?Subjective: ?Luke Solis is a 88 y.o. year old male who is a primary care patient of [name], [name], MD. The care management team was consulted for assistance with disease management and care coordination needs.   ? ?Engaged with patient by telephone for follow up visit in response to provider referral for case management and/or care coordination services.  ? ?Consent to Services:  ?The patient was given information about Chronic Care Management services, agreed to services, and gave verbal consent prior to initiation of services.  Please see initial visit note for detailed documentation.  ? ?Patient agreed to services and verbal consent obtained.  ? ?Assessment: Review of patient past medical history, allergies, medications, health status, including review of consultants reports, laboratory and other test data, was performed as part of comprehensive evaluation and provision of chronic care management services.  ? ?SDOH (Social Determinants of Health) assessments and interventions performed:   ? ?CCM Care Plan ? ?Allergies  ?Allergen Reactions  ? Broccoli [Brassica Oleracea] Swelling  ?  Patient states causes swelling in throat.  ? Celery Oil Nausea And Vomiting  ? ? ?Outpatient Encounter Medications as of 03/31/2022  ?Medication Sig  ? amLODipine (NORVASC) 5 MG tablet Take 1 tablet by mouth daily.  ? benzonatate (TESSALON) 200 MG capsule Take 1 capsule (200 mg total) by mouth 3 (three) times daily as needed for cough.  ? Calcium Carbonate-Vitamin D 600-400 MG-UNIT per tablet Take 1 tablet by mouth daily.  ? cephALEXin (KEFLEX) 500 MG capsule Take 1 capsule (500 mg total) by mouth 2 (two) times daily.  ? Continuous Blood Gluc Receiver (FREESTYLE LIBRE 14 DAY READER) DEVI On insulin shots 4 times a day Dx E11.9  ? Continuous Blood Gluc Sensor (FREESTYLE LIBRE 14 DAY SENSOR) MISC On insulin 4 times daily  type 2 dm on insulin  ? fexofenadine (ALLEGRA) 180 MG tablet Take 1 tablet (180 mg total) by mouth daily as needed for allergies.  ? FORACARE PREMIUM V10 TEST test strip USE AS DIRECTED.  ? GLOBAL EASE INJECT PEN NEEDLES 32G X 4 MM MISC USE TO INJECT LANTUS EVERY EVENING AND HUMALOG THREE (3) TIMES DAILY.  ? Insulin Glargine Solostar (LANTUS) 100 UNIT/ML Solostar Pen INJECT 20 UNITS SUB-Q AT BEDTIME.  ? insulin lispro (HUMALOG KWIKPEN) 100 UNIT/ML KwikPen Inject 5 Units into the skin 3 (three) times daily with meals. Under 100-no units ?101-175-3 units ?176-250-4 units ?251-350-5 units ?[phone] units-call provider ?[phone] units-call provider ?Greater than 525 call providerGive only if eats 50% or more of the meal.  ? ketoconazole (NIZORAL) 2 % cream APPLY THIN AMOUNT TWICE DAILY TO INFLAMMED AREA OF GENITALS.  ? lidocaine (XYLOCAINE) 2 % jelly Apply small amount at the end of the penis (urethral opening )q 4 prn  ? Multiple Vitamins-Minerals (PRESERVISION AREDS 2) CAPS Take 1 capsule by mouth 2 (two) times daily.  ? mupirocin ointment (BACTROBAN) 2 % Apply small amount to suprapubic catheter site when irritated 3 times daily as needed  ? nitroGLYCERIN (NITROSTAT) 0.4 MG SL tablet Place 1 tablet (0.4 mg total) under the tongue every 5 (five) minutes as needed for chest pain.  ? ofloxacin (OCUFLOX) 0.3 % ophthalmic solution   ? potassium chloride (KLOR-CON M) 10 MEQ tablet Take by mouth.  ? potassium chloride (KLOR-CON) 10 MEQ tablet TAKE 1 TABLET ONCE DAILY.  ? pregabalin (LYRICA) 50 MG capsule Take  50 mg by mouth 3 (three) times daily.  ? rosuvastatin (CRESTOR) 5 MG tablet TAKE 1 TABLET ONCE DAILY.  ? HYDROcodone-acetaminophen (NORCO/VICODIN) 5-325 MG tablet  (Patient not taking: Reported on 02/23/2022)  ? ?No facility-administered encounter medications on file as of 03/31/2022.  ? ? ?Patient Active Problem List  ? Diagnosis Date Noted  ? Suprapubic catheter ([place]) 12/25/2021  ? COVID 09/26/2021  ? Retention of  urine 08/12/2021  ? CKD (chronic kidney disease) stage 3, GFR 30-59 ml/min (HCC) 06/09/2021  ? UTI (urinary tract infection) 06/08/2021  ? Hypokalemia 06/08/2021  ? Leukocytosis 06/08/2021  ? Hyperglycemia due to diabetes mellitus ([place]) 06/08/2021  ? Chronic kidney disease 06/08/2021  ? Poorly controlled type 2 diabetes mellitus with peripheral neuropathy ([place]) 08/11/2019  ? OSA on CPAP 09/05/2018  ? Altered mental status 02/28/2018  ? Anemia 02/28/2018  ? Fatigue 08/03/2017  ? Post-traumatic male urethral stricture 12/27/2016  ? Mixed incontinence 05/31/2016  ? Prostate cancer ([place]) 10/22/2015  ? Forgetfulness 04/29/2015  ? Radiation cystitis 11/18/2014  ? Elevated prostate specific antigen (PSA) 11/12/2013  ? Type 2 diabetes mellitus with diabetic neuropathy ([place]) 07/17/2013  ? Hyperlipidemia 01/19/2010  ? OSA (obstructive sleep apnea) 01/19/2010  ? Essential hypertension, benign 01/19/2010  ? Coronary atherosclerosis of native coronary artery 01/19/2010  ? ? ?Conditions to be addressed/monitored:CAD and DMII ? ?Care Plan : RN- Care Manager Plan of Care  ?Updates made by Kassie Mends, RN since 03/31/2022 12:00 AM  ?  ? ?Problem: Chronic Disease management, Education, and care coordination needs ( Diabetes II, Cardiovascular disease)   ?Priority: High  ?  ? ?Long-Range Goal: Development of plan of care to address chronic disease management and care coordination needs ( Diabetes II, Cardiovascular disease)   ?Start Date: 10/07/2021  ?Expected End Date: 09/27/2022  ?Priority: High  ?Note:   ?Current Barriers:  ?Knowledge Deficits related to plan of care for management of CAD, HTN, HLD, DMII, and falls ?Chronic Disease Management support and education needs related to CAD, HTN, HLD, DMII, and falls ?HIPAA verified with patient.  Patient gave verbal permission to speak with his wife, Halford Goetzke who was also on call with patient.  Patient states he continues to receive RN home health services from [facility]  health due to weakness/ deconditioning.  He states he was in the hospital over the summer due to urinary tract infection and sepsis and became weak and fatigued, now has suprapubic catheter, had "lots of trouble with penile pain and pelvic pain" which can vary day to day, wife reports pt had suprapubic changed at urologist on 03/22/22, then started having pain and went to ED on 03/26/22 and diagnosed with acute cystitis, is now on antibiotic, pt is drinking water but probably not enough per spouse.  Patient reports having 2 falls in the past year without injury. Uses walker for ambulation. He states he uses the free style libre for blood sugar monitoring. He reports fasting CBG ranges 121-160 range and random readings 176-316 and have been slightly elevated due to infection.  Denies blood sugars <70.   Next follow up with primary care provider is 06/14/22. ? ?RNCM Clinical Goal(s):  ?Patient will verbalize understanding of plan for management of CAD, HTN, HLD, DMII, and Falls ?verbalize basic understanding of  CAD, HTN, HLD, DMII, and Falls disease process and self health management plan , review of EHR and  through collaboration with RN Care manager, provider, and care team.  ? ?Interventions: ?1:1 collaboration with primary  care provider regarding development and update of comprehensive plan of care as evidenced by provider attestation and co-signature ?Inter-disciplinary care team collaboration (see longitudinal plan of care) ?Evaluation of current treatment plan related to  self management and patient's adherence to plan as established by provider ? ?Diabetes Interventions:  New Goal  ?Assessed patient's understanding of A1c goal: <7% ?Reviewed medications with patient and discussed importance of medication adherence ?Counseled on importance of regular laboratory monitoring as prescribed ?Review of patient status, including review of consultants reports, relevant laboratory and other test results, and medications  completed ?Reviewed CBG log with patient's spouse ?Reinforced carbohydrate modified diet ?Reviewed role infection plays on blood sugar and importance of keeping CBG within normal limits ?Reviewed upcoming s

## 2022-04-04 DIAGNOSIS — I2510 Atherosclerotic heart disease of native coronary artery without angina pectoris: Secondary | ICD-10-CM | POA: Diagnosis not present

## 2022-04-04 DIAGNOSIS — E1140 Type 2 diabetes mellitus with diabetic neuropathy, unspecified: Secondary | ICD-10-CM

## 2022-04-04 DIAGNOSIS — Z794 Long term (current) use of insulin: Secondary | ICD-10-CM | POA: Diagnosis not present

## 2022-04-08 DIAGNOSIS — N390 Urinary tract infection, site not specified: Secondary | ICD-10-CM | POA: Diagnosis not present

## 2022-04-10 DIAGNOSIS — Z87440 Personal history of urinary (tract) infections: Secondary | ICD-10-CM | POA: Diagnosis not present

## 2022-04-10 DIAGNOSIS — N35919 Unspecified urethral stricture, male, unspecified site: Secondary | ICD-10-CM | POA: Diagnosis not present

## 2022-04-10 DIAGNOSIS — Z466 Encounter for fitting and adjustment of urinary device: Secondary | ICD-10-CM | POA: Diagnosis not present

## 2022-04-10 DIAGNOSIS — E1122 Type 2 diabetes mellitus with diabetic chronic kidney disease: Secondary | ICD-10-CM | POA: Diagnosis not present

## 2022-04-10 DIAGNOSIS — R4189 Other symptoms and signs involving cognitive functions and awareness: Secondary | ICD-10-CM | POA: Diagnosis not present

## 2022-04-10 DIAGNOSIS — E1165 Type 2 diabetes mellitus with hyperglycemia: Secondary | ICD-10-CM | POA: Diagnosis not present

## 2022-04-10 DIAGNOSIS — E785 Hyperlipidemia, unspecified: Secondary | ICD-10-CM | POA: Diagnosis not present

## 2022-04-10 DIAGNOSIS — E113211 Type 2 diabetes mellitus with mild nonproliferative diabetic retinopathy with macular edema, right eye: Secondary | ICD-10-CM | POA: Diagnosis not present

## 2022-04-10 DIAGNOSIS — Z8616 Personal history of COVID-19: Secondary | ICD-10-CM | POA: Diagnosis not present

## 2022-04-10 DIAGNOSIS — I2510 Atherosclerotic heart disease of native coronary artery without angina pectoris: Secondary | ICD-10-CM | POA: Diagnosis not present

## 2022-04-10 DIAGNOSIS — G4733 Obstructive sleep apnea (adult) (pediatric): Secondary | ICD-10-CM | POA: Diagnosis not present

## 2022-04-10 DIAGNOSIS — Z8546 Personal history of malignant neoplasm of prostate: Secondary | ICD-10-CM | POA: Diagnosis not present

## 2022-04-10 DIAGNOSIS — Z9181 History of falling: Secondary | ICD-10-CM | POA: Diagnosis not present

## 2022-04-10 DIAGNOSIS — Z8673 Personal history of transient ischemic attack (TIA), and cerebral infarction without residual deficits: Secondary | ICD-10-CM | POA: Diagnosis not present

## 2022-04-10 DIAGNOSIS — Z794 Long term (current) use of insulin: Secondary | ICD-10-CM | POA: Diagnosis not present

## 2022-04-10 DIAGNOSIS — I129 Hypertensive chronic kidney disease with stage 1 through stage 4 chronic kidney disease, or unspecified chronic kidney disease: Secondary | ICD-10-CM | POA: Diagnosis not present

## 2022-04-10 DIAGNOSIS — Z435 Encounter for attention to cystostomy: Secondary | ICD-10-CM | POA: Diagnosis not present

## 2022-04-10 DIAGNOSIS — I951 Orthostatic hypotension: Secondary | ICD-10-CM | POA: Diagnosis not present

## 2022-04-10 DIAGNOSIS — N1832 Chronic kidney disease, stage 3b: Secondary | ICD-10-CM | POA: Diagnosis not present

## 2022-04-10 DIAGNOSIS — E1142 Type 2 diabetes mellitus with diabetic polyneuropathy: Secondary | ICD-10-CM | POA: Diagnosis not present

## 2022-04-12 ENCOUNTER — Ambulatory Visit (INDEPENDENT_AMBULATORY_CARE_PROVIDER_SITE_OTHER): Payer: Medicare Other | Admitting: Family Medicine

## 2022-04-12 VITALS — BP 124/72 | HR 70 | Temp 98.3°F | Ht 74.0 in

## 2022-04-12 DIAGNOSIS — E876 Hypokalemia: Secondary | ICD-10-CM | POA: Diagnosis not present

## 2022-04-12 DIAGNOSIS — I25119 Atherosclerotic heart disease of native coronary artery with unspecified angina pectoris: Secondary | ICD-10-CM

## 2022-04-12 DIAGNOSIS — R300 Dysuria: Secondary | ICD-10-CM

## 2022-04-12 DIAGNOSIS — Z794 Long term (current) use of insulin: Secondary | ICD-10-CM

## 2022-04-12 DIAGNOSIS — E1140 Type 2 diabetes mellitus with diabetic neuropathy, unspecified: Secondary | ICD-10-CM

## 2022-04-12 DIAGNOSIS — E785 Hyperlipidemia, unspecified: Secondary | ICD-10-CM | POA: Diagnosis not present

## 2022-04-12 LAB — POCT URINALYSIS DIPSTICK
Spec Grav, UA: 1.025 (ref 1.010–1.025)
pH, UA: 7 (ref 5.0–8.0)

## 2022-04-12 MED ORDER — CEPHALEXIN 500 MG PO CAPS: 500.0000 mg | ORAL_CAPSULE | Freq: Three times a day (TID) | ORAL | 0 refills | Status: DC

## 2022-04-12 NOTE — Addendum Note (Signed)
Addended by: Dairl Ponder on: 04/12/2022 04:38 PM ? ? Modules accepted: Orders ? ?

## 2022-04-12 NOTE — Progress Notes (Signed)
? ?  Subjective:  ? ? Patient ID: Luke Solis, male    DOB: 1931/05/26, 88 y.o.   MRN: [id number] ? ?HPI ? ?Patient arrives with dysuria since Wednesday. ?Patient states some penile pain lower abdominal discomfort denies high fever chills sweats states home health nurse did a catheter change that helped some urine had an odor to it she was concerned about infection she called urology they have not heard back regarding what to do culture is pending presents here today because of penile pain ?Review of Systems ? ?   ?Objective:  ? Physical Exam ? ?Lungs clear heart regular pulse normal lower abdomen nontender ? ? ?   ?Assessment & Plan:  ?1. Dysuria ?Keflex 500 mg 3 times daily for 7 days culture pending ?- POCT urinalysis dipstick ? ?2. Type 2 diabetes mellitus with diabetic neuropathy, with long-term current use of insulin ([place]) ?Diabetes check A1c before next visit in July watch diet closely no low sugars recently ?- Hemoglobin A1c ? ?3. Hypokalemia ?History of hypokalemia recheck potassium await results ?- Basic metabolic panel ? ?4. Hyperlipidemia, unspecified hyperlipidemia type ?Hyperlipidemia continue medication check lipid profile ?- Lipid panel ? ?Healthy diet recommended ?

## 2022-04-15 LAB — URINE CULTURE

## 2022-04-16 ENCOUNTER — Other Ambulatory Visit: Payer: Self-pay | Admitting: Family Medicine

## 2022-04-19 ENCOUNTER — Other Ambulatory Visit: Payer: Self-pay | Admitting: *Deleted

## 2022-04-19 MED ORDER — AMLODIPINE BESYLATE 2.5 MG PO TABS: 2.5000 mg | ORAL_TABLET | Freq: Every day | ORAL | 5 refills | Status: DC

## 2022-04-20 ENCOUNTER — Other Ambulatory Visit: Payer: Self-pay | Admitting: Family Medicine

## 2022-04-21 DIAGNOSIS — H26492 Other secondary cataract, left eye: Secondary | ICD-10-CM | POA: Diagnosis not present

## 2022-04-23 DIAGNOSIS — I129 Hypertensive chronic kidney disease with stage 1 through stage 4 chronic kidney disease, or unspecified chronic kidney disease: Secondary | ICD-10-CM | POA: Diagnosis not present

## 2022-04-23 DIAGNOSIS — E1122 Type 2 diabetes mellitus with diabetic chronic kidney disease: Secondary | ICD-10-CM | POA: Diagnosis not present

## 2022-04-23 DIAGNOSIS — N1832 Chronic kidney disease, stage 3b: Secondary | ICD-10-CM | POA: Diagnosis not present

## 2022-04-23 DIAGNOSIS — Z435 Encounter for attention to cystostomy: Secondary | ICD-10-CM | POA: Diagnosis not present

## 2022-04-23 DIAGNOSIS — I951 Orthostatic hypotension: Secondary | ICD-10-CM | POA: Diagnosis not present

## 2022-04-23 DIAGNOSIS — Z466 Encounter for fitting and adjustment of urinary device: Secondary | ICD-10-CM | POA: Diagnosis not present

## 2022-04-26 DIAGNOSIS — H35372 Puckering of macula, left eye: Secondary | ICD-10-CM | POA: Diagnosis not present

## 2022-04-26 DIAGNOSIS — H43813 Vitreous degeneration, bilateral: Secondary | ICD-10-CM | POA: Diagnosis not present

## 2022-04-26 DIAGNOSIS — H353122 Nonexudative age-related macular degeneration, left eye, intermediate dry stage: Secondary | ICD-10-CM | POA: Diagnosis not present

## 2022-04-26 DIAGNOSIS — H353211 Exudative age-related macular degeneration, right eye, with active choroidal neovascularization: Secondary | ICD-10-CM | POA: Diagnosis not present

## 2022-05-01 ENCOUNTER — Ambulatory Visit
Admission: EM | Admit: 2022-05-01 | Discharge: 2022-05-01 | Disposition: A | Discharge status: Home / Self Care | Payer: Medicare Other | Attending: Nurse Practitioner | Admitting: Nurse Practitioner

## 2022-05-01 ENCOUNTER — Ambulatory Visit (INDEPENDENT_AMBULATORY_CARE_PROVIDER_SITE_OTHER): Payer: Medicare Other

## 2022-05-01 ENCOUNTER — Encounter: Payer: Self-pay | Admitting: Emergency Medicine

## 2022-05-01 ENCOUNTER — Ambulatory Visit: Payer: Medicare Other

## 2022-05-01 DIAGNOSIS — M25532 Pain in left wrist: Secondary | ICD-10-CM | POA: Diagnosis not present

## 2022-05-01 DIAGNOSIS — S60512A Abrasion of left hand, initial encounter: Secondary | ICD-10-CM | POA: Diagnosis not present

## 2022-05-01 DIAGNOSIS — S63502A Unspecified sprain of left wrist, initial encounter: Secondary | ICD-10-CM

## 2022-05-01 DIAGNOSIS — S0512XA Contusion of eyeball and orbital tissues, left eye, initial encounter: Secondary | ICD-10-CM | POA: Diagnosis not present

## 2022-05-01 DIAGNOSIS — M7989 Other specified soft tissue disorders: Secondary | ICD-10-CM | POA: Diagnosis not present

## 2022-05-01 DIAGNOSIS — S60419A Abrasion of unspecified finger, initial encounter: Secondary | ICD-10-CM

## 2022-05-01 MED ORDER — MUPIROCIN 2 % EX OINT
1.0000 | TOPICAL_OINTMENT | Freq: Two times a day (BID) | CUTANEOUS | 0 refills | Status: DC
Start: 2022-05-01 — End: 2022-08-05

## 2022-05-01 NOTE — ED Triage Notes (Signed)
Fell yesterday, pain to left wrist area.  Abrasion to left hand, bruising noted around left eye.

## 2022-05-01 NOTE — Discharge Instructions (Addendum)
The x-rays are negative for a wrist fracture or dislocation.  There are degenerative changes associated with osteoarthritis. As discussed, recommend applying ice to the right wrist and to the left eye.  Apply for 20 minutes, remove for 1 hour, then repeat. Continue keeping the left hand clean.  You may clean twice daily, and apply antibiotic ointment that was prescribed today.  Continue to monitor the area for worsening pain, swelling, or delayed healing. Gentle range of motion to the left wrist to keep the joint mobile and to help decrease recovery time. May take over-the-counter Tylenol as needed for pain or discomfort. Recommend following up with primary care within the next 7 days for reevaluation. Go to the ER immediately if he develops loss of consciousness change in mental status, weakness in the lower extremities, or other concerns.

## 2022-05-01 NOTE — ED Provider Notes (Signed)
[facility]    CSN: [id number] Arrival date & time: 05/01/22  0843      History   Chief Complaint No chief complaint on file.   HPI Luke Solis is a 88 y.o. male.   The patient is an 88 year old male who presents his wife and son after a fall 1 day ago.  Patient states he was walking using his cane, when he felt like his cane gave out.  States that he fell onto his left wrist/arm, and hit the left side of his face.  He presents today with pain and swelling to the left wrist.  Pain worsens with movement, gripping, and lifting.  He also has an abrasion to the left eyebrow with moderate bruising around the left eye.  Patient denies loss of consciousness, dizziness, headache, chest pain, shortness of breath, or difficulty breathing.  Patient states he does recall what was happening before and after he fell.  The history is provided by the patient and a relative.   Past Medical History:  Diagnosis Date   Cataract    Coronary atherosclerosis of native coronary artery    BMS proximal and mid RCA as well as BMS circumflex - 2002 (had residual 70% distal LAD), LVEF 60%   COVID    Diabetes mellitus without complication ([place])    Essential hypertension    Hyperlipidemia    Neuropathy    NSTEMI (non-ST elevated myocardial infarction) ([place])    2002   Prostate cancer ([place])    Sleep apnea    On CPAP   Stroke [facility])     Patient Active Problem List   Diagnosis Date Noted   Suprapubic catheter ([place]) 12/25/2021   COVID 09/26/2021   Retention of urine 08/12/2021   CKD (chronic kidney disease) stage 3, GFR 30-59 ml/min ([place]) 06/09/2021   UTI (urinary tract infection) 06/08/2021   Hypokalemia 06/08/2021   Leukocytosis 06/08/2021   Hyperglycemia due to diabetes mellitus ([place]) 06/08/2021   Chronic kidney disease 06/08/2021   Poorly controlled type 2 diabetes mellitus with peripheral neuropathy ([place]) 08/11/2019   OSA on CPAP 09/05/2018   Altered mental status 02/28/2018    Anemia 02/28/2018   Fatigue 08/03/2017   Post-traumatic male urethral stricture 12/27/2016   Mixed incontinence 05/31/2016   Prostate cancer ([place]) 10/22/2015   Forgetfulness 04/29/2015   Radiation cystitis 11/18/2014   Elevated prostate specific antigen (PSA) 11/12/2013   Type 2 diabetes mellitus with diabetic neuropathy ([place]) 07/17/2013   Hyperlipidemia 01/19/2010   OSA (obstructive sleep apnea) 01/19/2010   Essential hypertension, benign 01/19/2010   Coronary atherosclerosis of native coronary artery 01/19/2010    Past Surgical History:  Procedure Laterality Date   COLONOSCOPY     EYE SURGERY     INGUINAL HERNIA REPAIR     KNEE SURGERY     Patient states he has never had knee surgery   NOSE [place] Medications    Prior to Admission medications   Medication Sig Start Date End Date Taking? Authorizing Provider  amLODipine (NORVASC) 2.5 MG tablet Take 1 tablet (2.5 mg total) by mouth daily. 04/19/22   Kathyrn Drown, MD  benzonatate (TESSALON) 200 MG capsule Take 1 capsule (200 mg total) by mouth 3 (three) times daily as needed for cough. 09/25/21   Coral Spikes, DO  Calcium Carbonate-Vitamin D 600-400 MG-UNIT per tablet Take 1 tablet by mouth  daily.    [provider]  cephALEXin (KEFLEX) 500 MG capsule Take 1 capsule (500 mg total) by mouth 3 (three) times daily. 04/12/22   Kathyrn Drown, MD  Continuous Blood Gluc Receiver (FREESTYLE LIBRE 14 DAY READER) DEVI On insulin shots 4 times a day Dx E11.9 07/01/21   Kathyrn Drown, MD  Continuous Blood Gluc Sensor (FREESTYLE LIBRE 2 SENSOR) MISC ON INSULIN 4 TIMES DAILY TYPE 2 DM ON INSULIN. 04/20/22   Kathyrn Drown, MD  fexofenadine (ALLEGRA) 180 MG tablet Take 1 tablet (180 mg total) by mouth daily as needed for allergies. 06/13/21   Murlean Iba, MD  FORACARE PREMIUM V10 TEST test strip USE AS DIRECTED. 07/20/17   Mikey Kirschner, MD  GLOBAL EASE INJECT PEN  NEEDLES 32G X 4 MM MISC USE TO INJECT LANTUS EVERY EVENING AND HUMALOG THREE (3) TIMES DAILY. 11/02/21   Kathyrn Drown, MD  HYDROcodone-acetaminophen (NORCO/VICODIN) 5-325 MG tablet  01/11/22   [provider]  insulin lispro (HUMALOG KWIKPEN) 100 UNIT/ML KwikPen Inject 5 Units into the skin 3 (three) times daily with meals. Under 100-no units 101-175-3 units 176-250-4 units 251-350-5 units 762-831-5 units-call provider 176-160-7 units-call provider Greater than 525 call providerGive only if eats 50% or more of the meal. 07/08/21   [name], [name], MD  ketoconazole (NIZORAL) 2 % cream APPLY THIN AMOUNT TWICE DAILY TO INFLAMMED AREA OF GENITALS. 02/27/21   Kathyrn Drown, MD  LANTUS SOLOSTAR 100 UNIT/ML Solostar Pen INJECT 20 UNITS SUB-Q AT BEDTIME. 04/19/22   Kathyrn Drown, MD  lidocaine (XYLOCAINE) 2 % jelly Apply small amount at the end of the penis (urethral opening )q 4 prn 12/03/21   [name], [name], MD  Multiple Vitamins-Minerals (PRESERVISION AREDS 2) CAPS Take 1 capsule by mouth 2 (two) times daily. 02/22/22   [provider]  mupirocin ointment (BACTROBAN) 2 % Apply small amount to suprapubic catheter site when irritated 3 times daily as needed 10/15/21   Kathyrn Drown, MD  nitroGLYCERIN (NITROSTAT) 0.4 MG SL tablet Place 1 tablet (0.4 mg total) under the tongue every 5 (five) minutes as needed for chest pain. 06/13/21   Johnson, Clanford L, MD  ofloxacin (OCUFLOX) 0.3 % ophthalmic solution  09/24/21   [provider]  potassium chloride (KLOR-CON M) 10 MEQ tablet Take by mouth. 07/01/21   [provider]  potassium chloride (KLOR-CON) 10 MEQ tablet TAKE 1 TABLET ONCE DAILY. 04/19/22   Kathyrn Drown, MD  pregabalin (LYRICA) 50 MG capsule Take 50 mg by mouth 3 (three) times daily. 01/14/22   [provider]  rosuvastatin (CRESTOR) 5 MG tablet TAKE 1 TABLET ONCE DAILY. 04/20/22   Kathyrn Drown, MD    Family History Family History  Problem  Relation Age of Onset   Heart attack Mother    Diabetes Mother    Coronary artery disease Father    Hypertension Father    Heart attack Father    Stroke Maternal Grandfather    Lung disease Neg Hx    Cancer Neg Hx     Social History Social History   Tobacco Use   Smoking status: Never   Smokeless tobacco: Never  Vaping Use   Vaping Use: Never used  Substance Use Topics   Alcohol use: No    Alcohol/week: 0.0 standard drinks   Drug use: No     Allergies   Broccoli [brassica oleracea] and Celery oil   Review of Systems  Review of Systems PER HPI  Physical Exam Triage Vital Signs ED Triage Vitals  Enc Vitals Group     BP 05/01/22 0853 (!) 179/80     Pulse Rate 05/01/22 0853 77     Resp 05/01/22 0853 18     Temp 05/01/22 0853 97.7 F (36.5 C)     Temp Source 05/01/22 0853 Temporal     SpO2 05/01/22 0853 96 %     Weight --      Height --      Head Circumference --      Peak Flow --      Pain Score 05/01/22 0854 8     Pain Loc --      Pain Edu? --      Excl. in [place]? --    No data found.  Updated Vital Signs BP (!) 179/80 (BP Location: Right Arm)   Pulse 77   Temp 97.7 F (36.5 C) (Temporal)   Resp 18   SpO2 96%   Visual Acuity Right Eye Distance:   Left Eye Distance:   Bilateral Distance:    Right Eye Near:   Left Eye Near:    Bilateral Near:     Physical Exam Vitals and nursing note reviewed.  Constitutional:      General: He is not in acute distress.    Appearance: Normal appearance.  HENT:     Head: Normocephalic.  Eyes:     Extraocular Movements: Extraocular movements intact.     Conjunctiva/sclera: Conjunctivae normal.     Pupils: Pupils are equal, round, and reactive to light.  Cardiovascular:     Rate and Rhythm: Normal rate and regular rhythm.     Pulses: Normal pulses.     Heart sounds: Normal heart sounds.  Pulmonary:     Effort: Pulmonary effort is normal.     Breath sounds: Normal breath sounds.  Abdominal:     General:  Bowel sounds are normal.     Palpations: Abdomen is soft.  Musculoskeletal:     Cervical back: Normal range of motion.  Skin:    General: Skin is warm.     Findings: Bruising present.     Comments: Bruising around left eye. Swelling noted to left eyebrow with abrasion noted. Abrasion noted to palmar aspect of left hand. Area has healing skin present covering the wound. No bleeding, drainage noted. Area is swollen at the site, erythema noted.  Neurological:     General: No focal deficit present.     Mental Status: He is alert and oriented to person, place, and time.     Cranial Nerves: No cranial nerve deficit.     Sensory: No sensory deficit.     Coordination: Coordination normal.     Comments: Uses a cane at baseline  Psychiatric:        Mood and Affect: Mood normal.        Behavior: Behavior normal.     UC Treatments / Results  Labs (all labs ordered are listed, but only abnormal results are displayed) Labs Reviewed - No data to display  EKG   Radiology DG Wrist Complete Left  Result Date: 05/01/2022 CLINICAL DATA:  Fall, left wrist pain. EXAM: LEFT WRIST - COMPLETE 3+ VIEW COMPARISON:  None Available. FINDINGS: There is no evidence of fracture or dislocation. Osteopenia. Mild erosion of the ulnar styloid process, likely a chronic process. Soft tissue swelling and mild dystrophic calcifications about the dorsal aspect of the wrist. IMPRESSION: 1.  No radiographic evidence of acute osseous abnormality. 2. Diffuse osteopenia and mild dystrophic calcification and soft tissue swelling about the wrist. Electronically Signed   By: Keane Police D.O.   On: 05/01/2022 09:42    Procedures Procedures (including critical care time)  Medications Ordered in UC Medications - No data to display  Initial Impression / Assessment and Plan / UC Course  I have reviewed the triage vital signs and the nursing notes.  Pertinent labs & imaging results that were available during my care of the  patient were reviewed by me and considered in my medical decision making (see chart for details).  Patient presents after fall 1 day ago.  He has moderate bruising around the globe of the left eye, and abrasion to the left hand, and left wrist pain.  His x-rays are negative for any fracture or dislocation; however they do show degenerative changes and swelling.  Symptoms are consistent with a left wrist strain, contusion of the globe around the left eye, and abrasion of the left hand.  Supportive care was strongly advised to include ice and Tylenol.  Patient's wife was advised to keep the left hand clean and dry, mupirocin was also prescribed.  Strict return precautions were provided of when to go to the ER to include loss of consciousness change in mental status, weakness in the lower extremities, or other concerns. Final Clinical Impressions(s) / UC Diagnoses   Final diagnoses:  None   Discharge Instructions   None    ED Prescriptions   None    PDMP not reviewed this encounter.   Tish Men, NP 05/01/22 [phone]

## 2022-05-05 ENCOUNTER — Ambulatory Visit (INDEPENDENT_AMBULATORY_CARE_PROVIDER_SITE_OTHER): Payer: Medicare Other | Admitting: Family Medicine

## 2022-05-05 VITALS — BP 131/81 | HR 83 | Temp 98.0°F | Wt 155.2 lb

## 2022-05-05 DIAGNOSIS — E1122 Type 2 diabetes mellitus with diabetic chronic kidney disease: Secondary | ICD-10-CM | POA: Diagnosis not present

## 2022-05-05 DIAGNOSIS — I951 Orthostatic hypotension: Secondary | ICD-10-CM | POA: Diagnosis not present

## 2022-05-05 DIAGNOSIS — I25119 Atherosclerotic heart disease of native coronary artery with unspecified angina pectoris: Secondary | ICD-10-CM

## 2022-05-05 DIAGNOSIS — Z435 Encounter for attention to cystostomy: Secondary | ICD-10-CM | POA: Diagnosis not present

## 2022-05-05 DIAGNOSIS — S63502D Unspecified sprain of left wrist, subsequent encounter: Secondary | ICD-10-CM | POA: Diagnosis not present

## 2022-05-05 DIAGNOSIS — R300 Dysuria: Secondary | ICD-10-CM

## 2022-05-05 DIAGNOSIS — N1832 Chronic kidney disease, stage 3b: Secondary | ICD-10-CM | POA: Diagnosis not present

## 2022-05-05 DIAGNOSIS — I129 Hypertensive chronic kidney disease with stage 1 through stage 4 chronic kidney disease, or unspecified chronic kidney disease: Secondary | ICD-10-CM | POA: Diagnosis not present

## 2022-05-05 DIAGNOSIS — Z466 Encounter for fitting and adjustment of urinary device: Secondary | ICD-10-CM | POA: Diagnosis not present

## 2022-05-05 NOTE — Progress Notes (Signed)
Left  Subjective:    Patient ID: Luke Solis, male    DOB: 05-24-1931, 88 y.o.   MRN: [id number]  HPI  Patient seen at Urgent Care on 05/01/22. Pt has injury to left eye, left wrist and left knee Patient sustained fall Accidental No loss of consciousness No nausea vomiting or severe headache Energy level overall doing well Complains of left wrist pain and discomfort wearing a splint currently Patient relates that the main reason he fell was his walking cane collapsed Review of Systems     Objective:   Physical Exam General-in no acute distress Eyes-no discharge Lungs-respiratory rate normal, CTA CV-no murmurs,RRR Extremities skin warm dry no edema Neuro grossly normal Behavior normal, alert Contusion on the left knee which is healing Contusion of the left wrist with limited range of motion and some tenderness and pain Eyebrow is healing gradually Blackness under the normal for contusion       Assessment & Plan:  Head contusion-should gradually get better no need for CAT scan Left wrist contusion follow-up x-ray next week to exclude occult fracture Diabetes has follow-up in July blood work before that follow-up Penile pain has history of reoccurring urinary tract infection check urine culture Hold off on physical therapy currently

## 2022-05-06 DIAGNOSIS — Z9359 Other cystostomy status: Secondary | ICD-10-CM | POA: Diagnosis not present

## 2022-05-06 DIAGNOSIS — N3040 Irradiation cystitis without hematuria: Secondary | ICD-10-CM | POA: Diagnosis not present

## 2022-05-06 DIAGNOSIS — C61 Malignant neoplasm of prostate: Secondary | ICD-10-CM | POA: Diagnosis not present

## 2022-05-06 DIAGNOSIS — R339 Retention of urine, unspecified: Secondary | ICD-10-CM | POA: Diagnosis not present

## 2022-05-06 DIAGNOSIS — N35014 Post-traumatic urethral stricture, male, unspecified: Secondary | ICD-10-CM | POA: Diagnosis not present

## 2022-05-06 DIAGNOSIS — R972 Elevated prostate specific antigen [PSA]: Secondary | ICD-10-CM | POA: Diagnosis not present

## 2022-05-07 ENCOUNTER — Telehealth: Payer: Self-pay | Admitting: Family Medicine

## 2022-05-07 DIAGNOSIS — R300 Dysuria: Secondary | ICD-10-CM | POA: Diagnosis not present

## 2022-05-07 MED ORDER — CEFDINIR 300 MG PO CAPS: ORAL_CAPSULE | ORAL | 0 refills | Status: DC

## 2022-05-07 NOTE — Telephone Encounter (Signed)
Nurses-please do urine culture Wife is bringing by specimen after 1 PM Please also send in [place] 300 mg 1 twice daily for 7 days  to [facility] in [facility]

## 2022-05-07 NOTE — Telephone Encounter (Signed)
Urine culture sent and medication sent to pharmacy.

## 2022-05-08 LAB — URINE CULTURE

## 2022-05-08 LAB — SPECIMEN STATUS REPORT

## 2022-05-10 ENCOUNTER — Telehealth: Payer: Self-pay | Admitting: *Deleted

## 2022-05-10 DIAGNOSIS — R4189 Other symptoms and signs involving cognitive functions and awareness: Secondary | ICD-10-CM | POA: Diagnosis not present

## 2022-05-10 DIAGNOSIS — Z435 Encounter for attention to cystostomy: Secondary | ICD-10-CM | POA: Diagnosis not present

## 2022-05-10 DIAGNOSIS — I951 Orthostatic hypotension: Secondary | ICD-10-CM | POA: Diagnosis not present

## 2022-05-10 DIAGNOSIS — E785 Hyperlipidemia, unspecified: Secondary | ICD-10-CM | POA: Diagnosis not present

## 2022-05-10 DIAGNOSIS — Z466 Encounter for fitting and adjustment of urinary device: Secondary | ICD-10-CM | POA: Diagnosis not present

## 2022-05-10 DIAGNOSIS — Z8616 Personal history of COVID-19: Secondary | ICD-10-CM | POA: Diagnosis not present

## 2022-05-10 DIAGNOSIS — Z8673 Personal history of transient ischemic attack (TIA), and cerebral infarction without residual deficits: Secondary | ICD-10-CM | POA: Diagnosis not present

## 2022-05-10 DIAGNOSIS — G4733 Obstructive sleep apnea (adult) (pediatric): Secondary | ICD-10-CM | POA: Diagnosis not present

## 2022-05-10 DIAGNOSIS — I129 Hypertensive chronic kidney disease with stage 1 through stage 4 chronic kidney disease, or unspecified chronic kidney disease: Secondary | ICD-10-CM | POA: Diagnosis not present

## 2022-05-10 DIAGNOSIS — E1122 Type 2 diabetes mellitus with diabetic chronic kidney disease: Secondary | ICD-10-CM | POA: Diagnosis not present

## 2022-05-10 DIAGNOSIS — Z9181 History of falling: Secondary | ICD-10-CM | POA: Diagnosis not present

## 2022-05-10 DIAGNOSIS — Z8546 Personal history of malignant neoplasm of prostate: Secondary | ICD-10-CM | POA: Diagnosis not present

## 2022-05-10 DIAGNOSIS — Z794 Long term (current) use of insulin: Secondary | ICD-10-CM | POA: Diagnosis not present

## 2022-05-10 DIAGNOSIS — N35919 Unspecified urethral stricture, male, unspecified site: Secondary | ICD-10-CM | POA: Diagnosis not present

## 2022-05-10 DIAGNOSIS — I2510 Atherosclerotic heart disease of native coronary artery without angina pectoris: Secondary | ICD-10-CM | POA: Diagnosis not present

## 2022-05-10 DIAGNOSIS — E113211 Type 2 diabetes mellitus with mild nonproliferative diabetic retinopathy with macular edema, right eye: Secondary | ICD-10-CM | POA: Diagnosis not present

## 2022-05-10 DIAGNOSIS — E1142 Type 2 diabetes mellitus with diabetic polyneuropathy: Secondary | ICD-10-CM | POA: Diagnosis not present

## 2022-05-10 DIAGNOSIS — E1165 Type 2 diabetes mellitus with hyperglycemia: Secondary | ICD-10-CM | POA: Diagnosis not present

## 2022-05-10 DIAGNOSIS — N1832 Chronic kidney disease, stage 3b: Secondary | ICD-10-CM | POA: Diagnosis not present

## 2022-05-10 DIAGNOSIS — Z87440 Personal history of urinary (tract) infections: Secondary | ICD-10-CM | POA: Diagnosis not present

## 2022-05-10 NOTE — Telephone Encounter (Signed)
Wife called back to see if they need new orders to change cathter every 3 weeks instead of every 4 weeks since he keeps getting infections. If so will need orders sent to home health- [phone]

## 2022-05-10 NOTE — Telephone Encounter (Signed)
So unfortunately he is at risk of developing urinary tract infections due to catheters whether the catheter is changed every 2 weeks or every 4 weeks Indwelling catheters typically stay constantly infected to some degree This is because indwelling catheters are a focus of infection year regardless of how often they are changed It is still necessary to change at least monthly whether or not to change more often I will defer this to urology Treatments are recommended when the patient has symptoms of it such as discomfort fevers or other such I would recommend that they discuss this with urology to see what their thoughts are

## 2022-05-10 NOTE — Telephone Encounter (Signed)
Wife Georgie(DPR) advised per Dr Nicki Reaper: So unfortunately he is at risk of developing urinary tract infections due to catheters whether the catheter is changed every 2 weeks or every 4 weeks Indwelling catheters typically stay constantly infected to some degree This is because indwelling catheters are a focus of infection year regardless of how often they are changed It is still necessary to change at least monthly whether or not to change more often Dr Nicki Reaper will defer this to urology Treatments are recommended when the patient has symptoms of it such as discomfort fevers or other such Dr Nicki Reaper would recommend that they discuss this with urology to see what their thoughts are Wife verbalized understanding.

## 2022-05-11 LAB — URINE CULTURE

## 2022-05-11 LAB — SPECIMEN STATUS REPORT

## 2022-05-12 ENCOUNTER — Ambulatory Visit (HOSPITAL_COMMUNITY)
Admission: RE | Admit: 2022-05-12 | Discharge: 2022-05-12 | Disposition: A | Discharge status: Home / Self Care | Payer: Medicare Other | Source: Ambulatory Visit | Attending: Family Medicine | Admitting: Family Medicine

## 2022-05-12 DIAGNOSIS — S63502D Unspecified sprain of left wrist, subsequent encounter: Secondary | ICD-10-CM | POA: Insufficient documentation

## 2022-05-12 DIAGNOSIS — M19032 Primary osteoarthritis, left wrist: Secondary | ICD-10-CM | POA: Diagnosis not present

## 2022-05-14 ENCOUNTER — Other Ambulatory Visit: Payer: Self-pay | Admitting: Family Medicine

## 2022-05-14 DIAGNOSIS — S63502D Unspecified sprain of left wrist, subsequent encounter: Secondary | ICD-10-CM

## 2022-05-17 DIAGNOSIS — Z435 Encounter for attention to cystostomy: Secondary | ICD-10-CM | POA: Diagnosis not present

## 2022-05-17 DIAGNOSIS — E1122 Type 2 diabetes mellitus with diabetic chronic kidney disease: Secondary | ICD-10-CM | POA: Diagnosis not present

## 2022-05-17 DIAGNOSIS — Z466 Encounter for fitting and adjustment of urinary device: Secondary | ICD-10-CM | POA: Diagnosis not present

## 2022-05-17 DIAGNOSIS — I129 Hypertensive chronic kidney disease with stage 1 through stage 4 chronic kidney disease, or unspecified chronic kidney disease: Secondary | ICD-10-CM | POA: Diagnosis not present

## 2022-05-17 DIAGNOSIS — I951 Orthostatic hypotension: Secondary | ICD-10-CM | POA: Diagnosis not present

## 2022-05-17 DIAGNOSIS — N1832 Chronic kidney disease, stage 3b: Secondary | ICD-10-CM | POA: Diagnosis not present

## 2022-05-18 ENCOUNTER — Other Ambulatory Visit: Payer: Self-pay | Admitting: *Deleted

## 2022-05-18 DIAGNOSIS — R2681 Unsteadiness on feet: Secondary | ICD-10-CM

## 2022-05-18 DIAGNOSIS — S63502D Unspecified sprain of left wrist, subsequent encounter: Secondary | ICD-10-CM

## 2022-05-18 DIAGNOSIS — Z9181 History of falling: Secondary | ICD-10-CM

## 2022-05-19 DIAGNOSIS — M79671 Pain in right foot: Secondary | ICD-10-CM | POA: Diagnosis not present

## 2022-05-19 DIAGNOSIS — L609 Nail disorder, unspecified: Secondary | ICD-10-CM | POA: Diagnosis not present

## 2022-05-19 DIAGNOSIS — M79672 Pain in left foot: Secondary | ICD-10-CM | POA: Diagnosis not present

## 2022-05-19 DIAGNOSIS — E1140 Type 2 diabetes mellitus with diabetic neuropathy, unspecified: Secondary | ICD-10-CM | POA: Diagnosis not present

## 2022-05-27 DIAGNOSIS — Z466 Encounter for fitting and adjustment of urinary device: Secondary | ICD-10-CM | POA: Diagnosis not present

## 2022-05-27 DIAGNOSIS — I129 Hypertensive chronic kidney disease with stage 1 through stage 4 chronic kidney disease, or unspecified chronic kidney disease: Secondary | ICD-10-CM | POA: Diagnosis not present

## 2022-05-27 DIAGNOSIS — N1832 Chronic kidney disease, stage 3b: Secondary | ICD-10-CM | POA: Diagnosis not present

## 2022-05-27 DIAGNOSIS — I951 Orthostatic hypotension: Secondary | ICD-10-CM | POA: Diagnosis not present

## 2022-05-27 DIAGNOSIS — Z435 Encounter for attention to cystostomy: Secondary | ICD-10-CM | POA: Diagnosis not present

## 2022-05-27 DIAGNOSIS — E1122 Type 2 diabetes mellitus with diabetic chronic kidney disease: Secondary | ICD-10-CM | POA: Diagnosis not present

## 2022-05-31 ENCOUNTER — Ambulatory Visit (INDEPENDENT_AMBULATORY_CARE_PROVIDER_SITE_OTHER): Payer: Medicare Other | Admitting: Family Medicine

## 2022-05-31 VITALS — BP 128/72 | HR 72 | Temp 97.4°F | Wt 154.0 lb

## 2022-05-31 DIAGNOSIS — R300 Dysuria: Secondary | ICD-10-CM

## 2022-05-31 DIAGNOSIS — S63502D Unspecified sprain of left wrist, subsequent encounter: Secondary | ICD-10-CM

## 2022-05-31 DIAGNOSIS — R2681 Unsteadiness on feet: Secondary | ICD-10-CM

## 2022-05-31 DIAGNOSIS — I25119 Atherosclerotic heart disease of native coronary artery with unspecified angina pectoris: Secondary | ICD-10-CM | POA: Diagnosis not present

## 2022-05-31 LAB — POCT URINALYSIS DIP (CLINITEK)
Bilirubin, UA: NEGATIVE
Glucose, UA: NEGATIVE mg/dL
Ketones, POC UA: NEGATIVE mg/dL
Nitrite, UA: POSITIVE — AB
POC PROTEIN,UA: >=300 — AB
Spec Grav, UA: 1.02 (ref 1.010–1.025)
Urobilinogen, UA: 0.2 E.U./dL
pH, UA: 7.5 (ref 5.0–8.0)

## 2022-05-31 MED ORDER — CEFPROZIL 500 MG PO TABS: 500.0000 mg | ORAL_TABLET | Freq: Two times a day (BID) | ORAL | 0 refills | Status: DC

## 2022-06-03 LAB — SPECIMEN STATUS REPORT

## 2022-06-03 LAB — URINE CULTURE

## 2022-06-04 DIAGNOSIS — N1832 Chronic kidney disease, stage 3b: Secondary | ICD-10-CM | POA: Diagnosis not present

## 2022-06-04 DIAGNOSIS — I129 Hypertensive chronic kidney disease with stage 1 through stage 4 chronic kidney disease, or unspecified chronic kidney disease: Secondary | ICD-10-CM | POA: Diagnosis not present

## 2022-06-04 DIAGNOSIS — I951 Orthostatic hypotension: Secondary | ICD-10-CM | POA: Diagnosis not present

## 2022-06-04 DIAGNOSIS — E1122 Type 2 diabetes mellitus with diabetic chronic kidney disease: Secondary | ICD-10-CM | POA: Diagnosis not present

## 2022-06-04 DIAGNOSIS — Z466 Encounter for fitting and adjustment of urinary device: Secondary | ICD-10-CM | POA: Diagnosis not present

## 2022-06-04 DIAGNOSIS — Z435 Encounter for attention to cystostomy: Secondary | ICD-10-CM | POA: Diagnosis not present

## 2022-06-06 DIAGNOSIS — E1122 Type 2 diabetes mellitus with diabetic chronic kidney disease: Secondary | ICD-10-CM | POA: Diagnosis not present

## 2022-06-06 DIAGNOSIS — I129 Hypertensive chronic kidney disease with stage 1 through stage 4 chronic kidney disease, or unspecified chronic kidney disease: Secondary | ICD-10-CM | POA: Diagnosis not present

## 2022-06-06 DIAGNOSIS — Z466 Encounter for fitting and adjustment of urinary device: Secondary | ICD-10-CM | POA: Diagnosis not present

## 2022-06-06 DIAGNOSIS — N1832 Chronic kidney disease, stage 3b: Secondary | ICD-10-CM | POA: Diagnosis not present

## 2022-06-06 DIAGNOSIS — I951 Orthostatic hypotension: Secondary | ICD-10-CM | POA: Diagnosis not present

## 2022-06-06 DIAGNOSIS — Z435 Encounter for attention to cystostomy: Secondary | ICD-10-CM | POA: Diagnosis not present

## 2022-06-07 ENCOUNTER — Other Ambulatory Visit: Payer: Self-pay | Admitting: Family Medicine

## 2022-06-07 DIAGNOSIS — H353122 Nonexudative age-related macular degeneration, left eye, intermediate dry stage: Secondary | ICD-10-CM | POA: Diagnosis not present

## 2022-06-07 DIAGNOSIS — N1832 Chronic kidney disease, stage 3b: Secondary | ICD-10-CM | POA: Diagnosis not present

## 2022-06-07 DIAGNOSIS — Z466 Encounter for fitting and adjustment of urinary device: Secondary | ICD-10-CM | POA: Diagnosis not present

## 2022-06-07 DIAGNOSIS — E1122 Type 2 diabetes mellitus with diabetic chronic kidney disease: Secondary | ICD-10-CM | POA: Diagnosis not present

## 2022-06-07 DIAGNOSIS — I129 Hypertensive chronic kidney disease with stage 1 through stage 4 chronic kidney disease, or unspecified chronic kidney disease: Secondary | ICD-10-CM | POA: Diagnosis not present

## 2022-06-07 DIAGNOSIS — Z435 Encounter for attention to cystostomy: Secondary | ICD-10-CM | POA: Diagnosis not present

## 2022-06-07 DIAGNOSIS — H43813 Vitreous degeneration, bilateral: Secondary | ICD-10-CM | POA: Diagnosis not present

## 2022-06-07 DIAGNOSIS — H35372 Puckering of macula, left eye: Secondary | ICD-10-CM | POA: Diagnosis not present

## 2022-06-07 DIAGNOSIS — H353211 Exudative age-related macular degeneration, right eye, with active choroidal neovascularization: Secondary | ICD-10-CM | POA: Diagnosis not present

## 2022-06-07 DIAGNOSIS — I951 Orthostatic hypotension: Secondary | ICD-10-CM | POA: Diagnosis not present

## 2022-06-09 DIAGNOSIS — E1142 Type 2 diabetes mellitus with diabetic polyneuropathy: Secondary | ICD-10-CM | POA: Diagnosis not present

## 2022-06-09 DIAGNOSIS — E1165 Type 2 diabetes mellitus with hyperglycemia: Secondary | ICD-10-CM | POA: Diagnosis not present

## 2022-06-09 DIAGNOSIS — R4189 Other symptoms and signs involving cognitive functions and awareness: Secondary | ICD-10-CM | POA: Diagnosis not present

## 2022-06-09 DIAGNOSIS — I951 Orthostatic hypotension: Secondary | ICD-10-CM | POA: Diagnosis not present

## 2022-06-09 DIAGNOSIS — Z794 Long term (current) use of insulin: Secondary | ICD-10-CM | POA: Diagnosis not present

## 2022-06-09 DIAGNOSIS — G4733 Obstructive sleep apnea (adult) (pediatric): Secondary | ICD-10-CM | POA: Diagnosis not present

## 2022-06-09 DIAGNOSIS — E785 Hyperlipidemia, unspecified: Secondary | ICD-10-CM | POA: Diagnosis not present

## 2022-06-09 DIAGNOSIS — N390 Urinary tract infection, site not specified: Secondary | ICD-10-CM | POA: Diagnosis not present

## 2022-06-09 DIAGNOSIS — N1832 Chronic kidney disease, stage 3b: Secondary | ICD-10-CM | POA: Diagnosis not present

## 2022-06-09 DIAGNOSIS — Z8673 Personal history of transient ischemic attack (TIA), and cerebral infarction without residual deficits: Secondary | ICD-10-CM | POA: Diagnosis not present

## 2022-06-09 DIAGNOSIS — Z9181 History of falling: Secondary | ICD-10-CM | POA: Diagnosis not present

## 2022-06-09 DIAGNOSIS — Z87440 Personal history of urinary (tract) infections: Secondary | ICD-10-CM | POA: Diagnosis not present

## 2022-06-09 DIAGNOSIS — I2510 Atherosclerotic heart disease of native coronary artery without angina pectoris: Secondary | ICD-10-CM | POA: Diagnosis not present

## 2022-06-09 DIAGNOSIS — Z9350 Unspecified cystostomy status: Secondary | ICD-10-CM | POA: Diagnosis not present

## 2022-06-09 DIAGNOSIS — Z466 Encounter for fitting and adjustment of urinary device: Secondary | ICD-10-CM | POA: Diagnosis not present

## 2022-06-09 DIAGNOSIS — Z8616 Personal history of COVID-19: Secondary | ICD-10-CM | POA: Diagnosis not present

## 2022-06-09 DIAGNOSIS — Z8546 Personal history of malignant neoplasm of prostate: Secondary | ICD-10-CM | POA: Diagnosis not present

## 2022-06-09 DIAGNOSIS — R296 Repeated falls: Secondary | ICD-10-CM | POA: Diagnosis not present

## 2022-06-09 DIAGNOSIS — E113211 Type 2 diabetes mellitus with mild nonproliferative diabetic retinopathy with macular edema, right eye: Secondary | ICD-10-CM | POA: Diagnosis not present

## 2022-06-09 DIAGNOSIS — E1122 Type 2 diabetes mellitus with diabetic chronic kidney disease: Secondary | ICD-10-CM | POA: Diagnosis not present

## 2022-06-09 DIAGNOSIS — N35919 Unspecified urethral stricture, male, unspecified site: Secondary | ICD-10-CM | POA: Diagnosis not present

## 2022-06-09 DIAGNOSIS — I129 Hypertensive chronic kidney disease with stage 1 through stage 4 chronic kidney disease, or unspecified chronic kidney disease: Secondary | ICD-10-CM | POA: Diagnosis not present

## 2022-06-10 DIAGNOSIS — E1140 Type 2 diabetes mellitus with diabetic neuropathy, unspecified: Secondary | ICD-10-CM | POA: Diagnosis not present

## 2022-06-10 DIAGNOSIS — Z794 Long term (current) use of insulin: Secondary | ICD-10-CM | POA: Diagnosis not present

## 2022-06-10 DIAGNOSIS — E876 Hypokalemia: Secondary | ICD-10-CM | POA: Diagnosis not present

## 2022-06-10 DIAGNOSIS — E785 Hyperlipidemia, unspecified: Secondary | ICD-10-CM | POA: Diagnosis not present

## 2022-06-11 LAB — BASIC METABOLIC PANEL
BUN/Creatinine Ratio: 18 (ref 10–24)
BUN: 22 mg/dL (ref 8–27)
CO2: 22 mmol/L (ref 20–29)
Calcium: 9.3 mg/dL (ref 8.6–10.2)
Chloride: 106 mmol/L (ref 96–106)
Creatinine, Ser: 1.23 mg/dL (ref 0.76–1.27)
Glucose: 139 mg/dL — ABNORMAL HIGH (ref 70–99)
Potassium: 4 mmol/L (ref 3.5–5.2)
Sodium: 142 mmol/L (ref 134–144)
eGFR: 56 mL/min/{1.73_m2} — ABNORMAL LOW (ref >59)

## 2022-06-11 LAB — LIPID PANEL
Chol/HDL Ratio: 3.6 ratio (ref 0.0–5.0)
Cholesterol, Total: 179 mg/dL (ref 100–199)
HDL: 50 mg/dL (ref >39)
LDL Chol Calc (NIH): 109 mg/dL — ABNORMAL HIGH (ref 0–99)
Triglycerides: 111 mg/dL (ref 0–149)
VLDL Cholesterol Cal: 20 mg/dL (ref 5–40)

## 2022-06-11 LAB — HEMOGLOBIN A1C
Est. average glucose Bld gHb Est-mCnc: 163 mg/dL
Hgb A1c MFr Bld: 7.3 % — ABNORMAL HIGH (ref 4.8–5.6)

## 2022-06-14 ENCOUNTER — Other Ambulatory Visit: Payer: Self-pay | Admitting: Family Medicine

## 2022-06-14 ENCOUNTER — Ambulatory Visit: Payer: Self-pay | Admitting: Family Medicine

## 2022-06-16 ENCOUNTER — Ambulatory Visit (INDEPENDENT_AMBULATORY_CARE_PROVIDER_SITE_OTHER): Payer: Medicare Other | Admitting: *Deleted

## 2022-06-16 ENCOUNTER — Telehealth: Payer: Self-pay

## 2022-06-16 DIAGNOSIS — R296 Repeated falls: Secondary | ICD-10-CM | POA: Diagnosis not present

## 2022-06-16 DIAGNOSIS — Z466 Encounter for fitting and adjustment of urinary device: Secondary | ICD-10-CM | POA: Diagnosis not present

## 2022-06-16 DIAGNOSIS — N1832 Chronic kidney disease, stage 3b: Secondary | ICD-10-CM | POA: Diagnosis not present

## 2022-06-16 DIAGNOSIS — I129 Hypertensive chronic kidney disease with stage 1 through stage 4 chronic kidney disease, or unspecified chronic kidney disease: Secondary | ICD-10-CM | POA: Diagnosis not present

## 2022-06-16 DIAGNOSIS — N390 Urinary tract infection, site not specified: Secondary | ICD-10-CM | POA: Diagnosis not present

## 2022-06-16 DIAGNOSIS — E1122 Type 2 diabetes mellitus with diabetic chronic kidney disease: Secondary | ICD-10-CM | POA: Diagnosis not present

## 2022-06-16 DIAGNOSIS — I2510 Atherosclerotic heart disease of native coronary artery without angina pectoris: Secondary | ICD-10-CM

## 2022-06-16 DIAGNOSIS — E1140 Type 2 diabetes mellitus with diabetic neuropathy, unspecified: Secondary | ICD-10-CM

## 2022-06-16 NOTE — Chronic Care Management (AMB) (Signed)
   06/16/2022  Luke Solis 1931-04-11 [id number]   Patient cancelled most recent appointment with RN care manager and did not reschedule. Care plan updated and resolved, case closed.  Jacqlyn Larsen [facility], BSN RN Case Manager [facility] [phone]

## 2022-06-17 ENCOUNTER — Ambulatory Visit (INDEPENDENT_AMBULATORY_CARE_PROVIDER_SITE_OTHER): Payer: Medicare Other | Admitting: Family Medicine

## 2022-06-17 VITALS — BP 123/66 | HR 70 | Temp 97.3°F | Ht 74.0 in | Wt 157.4 lb

## 2022-06-17 DIAGNOSIS — Z9359 Other cystostomy status: Secondary | ICD-10-CM

## 2022-06-17 DIAGNOSIS — I25119 Atherosclerotic heart disease of native coronary artery with unspecified angina pectoris: Secondary | ICD-10-CM

## 2022-06-17 DIAGNOSIS — N1832 Chronic kidney disease, stage 3b: Secondary | ICD-10-CM | POA: Diagnosis not present

## 2022-06-17 DIAGNOSIS — Z794 Long term (current) use of insulin: Secondary | ICD-10-CM | POA: Diagnosis not present

## 2022-06-17 DIAGNOSIS — E785 Hyperlipidemia, unspecified: Secondary | ICD-10-CM | POA: Diagnosis not present

## 2022-06-17 DIAGNOSIS — E1140 Type 2 diabetes mellitus with diabetic neuropathy, unspecified: Secondary | ICD-10-CM | POA: Diagnosis not present

## 2022-06-17 NOTE — Progress Notes (Signed)
   Subjective:    Patient ID: Luke Solis, male    DOB: 1931/05/29, 88 y.o.   MRN: [id number]  HPI Patient with diabetes.  Not having any low sugar spells recently  Had recent UTI this is doing better he does have suprapubic catheter this is permanent because of bladder outlet obstruction with a history of prostate cancer He denies any suprapubic pain dysuria or sweats Patient follows urology regular basis States energy level doing well   Review of Systems     Objective:   Physical Exam  General-in no acute distress Eyes-no discharge Lungs-respiratory rate normal, CTA CV-no murmurs,RRR Extremities skin warm dry no edema Neuro grossly normal Behavior normal, alert       Assessment & Plan:  1. Type 2 diabetes mellitus with diabetic neuropathy, with long-term current use of insulin (HCC) Diabetes good control continue current medications - Microalbumin/Creatinine Ratio, Urine - Basic metabolic panel - Hemoglobin A1c - Lipid panel - Hepatic Function Panel  2. Hyperlipidemia, unspecified hyperlipidemia type Continue Crestor. - Microalbumin/Creatinine Ratio, Urine - Basic metabolic panel - Hemoglobin A1c - Lipid panel - Hepatic Function Panel  3. Stage 3b chronic kidney disease ([place]) Monitor closely do labs before next visit - Microalbumin/Creatinine Ratio, Urine - Basic metabolic panel - Hemoglobin A1c - Lipid panel - Hepatic Function Panel  4. Suprapubic catheter [facility]) This is functioning well currently no UTI currently follow-up within 3 to 4 months sooner if need be

## 2022-06-22 DIAGNOSIS — N1832 Chronic kidney disease, stage 3b: Secondary | ICD-10-CM | POA: Diagnosis not present

## 2022-06-22 DIAGNOSIS — R296 Repeated falls: Secondary | ICD-10-CM | POA: Diagnosis not present

## 2022-06-22 DIAGNOSIS — Z466 Encounter for fitting and adjustment of urinary device: Secondary | ICD-10-CM | POA: Diagnosis not present

## 2022-06-22 DIAGNOSIS — N390 Urinary tract infection, site not specified: Secondary | ICD-10-CM | POA: Diagnosis not present

## 2022-06-22 DIAGNOSIS — E1122 Type 2 diabetes mellitus with diabetic chronic kidney disease: Secondary | ICD-10-CM | POA: Diagnosis not present

## 2022-06-22 DIAGNOSIS — I129 Hypertensive chronic kidney disease with stage 1 through stage 4 chronic kidney disease, or unspecified chronic kidney disease: Secondary | ICD-10-CM | POA: Diagnosis not present

## 2022-06-28 DIAGNOSIS — I129 Hypertensive chronic kidney disease with stage 1 through stage 4 chronic kidney disease, or unspecified chronic kidney disease: Secondary | ICD-10-CM | POA: Diagnosis not present

## 2022-06-28 DIAGNOSIS — N1832 Chronic kidney disease, stage 3b: Secondary | ICD-10-CM | POA: Diagnosis not present

## 2022-06-28 DIAGNOSIS — Z466 Encounter for fitting and adjustment of urinary device: Secondary | ICD-10-CM | POA: Diagnosis not present

## 2022-06-28 DIAGNOSIS — E1122 Type 2 diabetes mellitus with diabetic chronic kidney disease: Secondary | ICD-10-CM | POA: Diagnosis not present

## 2022-06-28 DIAGNOSIS — N390 Urinary tract infection, site not specified: Secondary | ICD-10-CM | POA: Diagnosis not present

## 2022-06-28 DIAGNOSIS — R296 Repeated falls: Secondary | ICD-10-CM | POA: Diagnosis not present

## 2022-06-29 DIAGNOSIS — I129 Hypertensive chronic kidney disease with stage 1 through stage 4 chronic kidney disease, or unspecified chronic kidney disease: Secondary | ICD-10-CM | POA: Diagnosis not present

## 2022-06-29 DIAGNOSIS — N390 Urinary tract infection, site not specified: Secondary | ICD-10-CM | POA: Diagnosis not present

## 2022-06-29 DIAGNOSIS — N1832 Chronic kidney disease, stage 3b: Secondary | ICD-10-CM | POA: Diagnosis not present

## 2022-06-29 DIAGNOSIS — E1122 Type 2 diabetes mellitus with diabetic chronic kidney disease: Secondary | ICD-10-CM | POA: Diagnosis not present

## 2022-06-29 DIAGNOSIS — Z466 Encounter for fitting and adjustment of urinary device: Secondary | ICD-10-CM | POA: Diagnosis not present

## 2022-06-29 DIAGNOSIS — R295 Transient paralysis: Secondary | ICD-10-CM | POA: Diagnosis not present

## 2022-06-29 DIAGNOSIS — I951 Orthostatic hypotension: Secondary | ICD-10-CM | POA: Diagnosis not present

## 2022-07-01 DIAGNOSIS — N390 Urinary tract infection, site not specified: Secondary | ICD-10-CM | POA: Diagnosis not present

## 2022-07-01 DIAGNOSIS — N1832 Chronic kidney disease, stage 3b: Secondary | ICD-10-CM | POA: Diagnosis not present

## 2022-07-01 DIAGNOSIS — E1122 Type 2 diabetes mellitus with diabetic chronic kidney disease: Secondary | ICD-10-CM | POA: Diagnosis not present

## 2022-07-01 DIAGNOSIS — R296 Repeated falls: Secondary | ICD-10-CM | POA: Diagnosis not present

## 2022-07-01 DIAGNOSIS — Z466 Encounter for fitting and adjustment of urinary device: Secondary | ICD-10-CM | POA: Diagnosis not present

## 2022-07-01 DIAGNOSIS — I129 Hypertensive chronic kidney disease with stage 1 through stage 4 chronic kidney disease, or unspecified chronic kidney disease: Secondary | ICD-10-CM | POA: Diagnosis not present

## 2022-07-05 DIAGNOSIS — Z794 Long term (current) use of insulin: Secondary | ICD-10-CM | POA: Diagnosis not present

## 2022-07-05 DIAGNOSIS — R296 Repeated falls: Secondary | ICD-10-CM | POA: Diagnosis not present

## 2022-07-05 DIAGNOSIS — I2510 Atherosclerotic heart disease of native coronary artery without angina pectoris: Secondary | ICD-10-CM

## 2022-07-05 DIAGNOSIS — Z466 Encounter for fitting and adjustment of urinary device: Secondary | ICD-10-CM | POA: Diagnosis not present

## 2022-07-05 DIAGNOSIS — E1140 Type 2 diabetes mellitus with diabetic neuropathy, unspecified: Secondary | ICD-10-CM | POA: Diagnosis not present

## 2022-07-05 DIAGNOSIS — N390 Urinary tract infection, site not specified: Secondary | ICD-10-CM | POA: Diagnosis not present

## 2022-07-05 DIAGNOSIS — E1122 Type 2 diabetes mellitus with diabetic chronic kidney disease: Secondary | ICD-10-CM | POA: Diagnosis not present

## 2022-07-05 DIAGNOSIS — I129 Hypertensive chronic kidney disease with stage 1 through stage 4 chronic kidney disease, or unspecified chronic kidney disease: Secondary | ICD-10-CM | POA: Diagnosis not present

## 2022-07-05 DIAGNOSIS — N1832 Chronic kidney disease, stage 3b: Secondary | ICD-10-CM | POA: Diagnosis not present

## 2022-07-07 ENCOUNTER — Other Ambulatory Visit: Payer: Self-pay | Admitting: Family Medicine

## 2022-07-09 DIAGNOSIS — G4733 Obstructive sleep apnea (adult) (pediatric): Secondary | ICD-10-CM | POA: Diagnosis not present

## 2022-07-09 DIAGNOSIS — N1832 Chronic kidney disease, stage 3b: Secondary | ICD-10-CM | POA: Diagnosis not present

## 2022-07-09 DIAGNOSIS — Z8616 Personal history of COVID-19: Secondary | ICD-10-CM | POA: Diagnosis not present

## 2022-07-09 DIAGNOSIS — R296 Repeated falls: Secondary | ICD-10-CM | POA: Diagnosis not present

## 2022-07-09 DIAGNOSIS — N35919 Unspecified urethral stricture, male, unspecified site: Secondary | ICD-10-CM | POA: Diagnosis not present

## 2022-07-09 DIAGNOSIS — E1142 Type 2 diabetes mellitus with diabetic polyneuropathy: Secondary | ICD-10-CM | POA: Diagnosis not present

## 2022-07-09 DIAGNOSIS — I951 Orthostatic hypotension: Secondary | ICD-10-CM | POA: Diagnosis not present

## 2022-07-09 DIAGNOSIS — I2510 Atherosclerotic heart disease of native coronary artery without angina pectoris: Secondary | ICD-10-CM | POA: Diagnosis not present

## 2022-07-09 DIAGNOSIS — Z794 Long term (current) use of insulin: Secondary | ICD-10-CM | POA: Diagnosis not present

## 2022-07-09 DIAGNOSIS — E1122 Type 2 diabetes mellitus with diabetic chronic kidney disease: Secondary | ICD-10-CM | POA: Diagnosis not present

## 2022-07-09 DIAGNOSIS — R4189 Other symptoms and signs involving cognitive functions and awareness: Secondary | ICD-10-CM | POA: Diagnosis not present

## 2022-07-09 DIAGNOSIS — Z466 Encounter for fitting and adjustment of urinary device: Secondary | ICD-10-CM | POA: Diagnosis not present

## 2022-07-09 DIAGNOSIS — Z8673 Personal history of transient ischemic attack (TIA), and cerebral infarction without residual deficits: Secondary | ICD-10-CM | POA: Diagnosis not present

## 2022-07-09 DIAGNOSIS — Z9181 History of falling: Secondary | ICD-10-CM | POA: Diagnosis not present

## 2022-07-09 DIAGNOSIS — N390 Urinary tract infection, site not specified: Secondary | ICD-10-CM | POA: Diagnosis not present

## 2022-07-09 DIAGNOSIS — Z87440 Personal history of urinary (tract) infections: Secondary | ICD-10-CM | POA: Diagnosis not present

## 2022-07-09 DIAGNOSIS — I129 Hypertensive chronic kidney disease with stage 1 through stage 4 chronic kidney disease, or unspecified chronic kidney disease: Secondary | ICD-10-CM | POA: Diagnosis not present

## 2022-07-09 DIAGNOSIS — E113211 Type 2 diabetes mellitus with mild nonproliferative diabetic retinopathy with macular edema, right eye: Secondary | ICD-10-CM | POA: Diagnosis not present

## 2022-07-09 DIAGNOSIS — Z8546 Personal history of malignant neoplasm of prostate: Secondary | ICD-10-CM | POA: Diagnosis not present

## 2022-07-09 DIAGNOSIS — E785 Hyperlipidemia, unspecified: Secondary | ICD-10-CM | POA: Diagnosis not present

## 2022-07-09 DIAGNOSIS — E1165 Type 2 diabetes mellitus with hyperglycemia: Secondary | ICD-10-CM | POA: Diagnosis not present

## 2022-07-09 DIAGNOSIS — Z9350 Unspecified cystostomy status: Secondary | ICD-10-CM | POA: Diagnosis not present

## 2022-07-28 DIAGNOSIS — L609 Nail disorder, unspecified: Secondary | ICD-10-CM | POA: Diagnosis not present

## 2022-07-28 DIAGNOSIS — E1140 Type 2 diabetes mellitus with diabetic neuropathy, unspecified: Secondary | ICD-10-CM | POA: Diagnosis not present

## 2022-07-28 DIAGNOSIS — M79672 Pain in left foot: Secondary | ICD-10-CM | POA: Diagnosis not present

## 2022-07-28 DIAGNOSIS — M79671 Pain in right foot: Secondary | ICD-10-CM | POA: Diagnosis not present

## 2022-07-31 DIAGNOSIS — R296 Repeated falls: Secondary | ICD-10-CM | POA: Diagnosis not present

## 2022-07-31 DIAGNOSIS — Z466 Encounter for fitting and adjustment of urinary device: Secondary | ICD-10-CM | POA: Diagnosis not present

## 2022-07-31 DIAGNOSIS — N390 Urinary tract infection, site not specified: Secondary | ICD-10-CM | POA: Diagnosis not present

## 2022-07-31 DIAGNOSIS — I129 Hypertensive chronic kidney disease with stage 1 through stage 4 chronic kidney disease, or unspecified chronic kidney disease: Secondary | ICD-10-CM | POA: Diagnosis not present

## 2022-07-31 DIAGNOSIS — E1122 Type 2 diabetes mellitus with diabetic chronic kidney disease: Secondary | ICD-10-CM | POA: Diagnosis not present

## 2022-07-31 DIAGNOSIS — N1832 Chronic kidney disease, stage 3b: Secondary | ICD-10-CM | POA: Diagnosis not present

## 2022-08-02 DIAGNOSIS — H353211 Exudative age-related macular degeneration, right eye, with active choroidal neovascularization: Secondary | ICD-10-CM | POA: Diagnosis not present

## 2022-08-02 DIAGNOSIS — H43813 Vitreous degeneration, bilateral: Secondary | ICD-10-CM | POA: Diagnosis not present

## 2022-08-02 DIAGNOSIS — H35372 Puckering of macula, left eye: Secondary | ICD-10-CM | POA: Diagnosis not present

## 2022-08-02 DIAGNOSIS — H353122 Nonexudative age-related macular degeneration, left eye, intermediate dry stage: Secondary | ICD-10-CM | POA: Diagnosis not present

## 2022-08-03 DIAGNOSIS — N1832 Chronic kidney disease, stage 3b: Secondary | ICD-10-CM | POA: Diagnosis not present

## 2022-08-03 DIAGNOSIS — Z466 Encounter for fitting and adjustment of urinary device: Secondary | ICD-10-CM | POA: Diagnosis not present

## 2022-08-03 DIAGNOSIS — N390 Urinary tract infection, site not specified: Secondary | ICD-10-CM | POA: Diagnosis not present

## 2022-08-03 DIAGNOSIS — E1122 Type 2 diabetes mellitus with diabetic chronic kidney disease: Secondary | ICD-10-CM | POA: Diagnosis not present

## 2022-08-03 DIAGNOSIS — I129 Hypertensive chronic kidney disease with stage 1 through stage 4 chronic kidney disease, or unspecified chronic kidney disease: Secondary | ICD-10-CM | POA: Diagnosis not present

## 2022-08-03 DIAGNOSIS — R296 Repeated falls: Secondary | ICD-10-CM | POA: Diagnosis not present

## 2022-08-05 ENCOUNTER — Ambulatory Visit (INDEPENDENT_AMBULATORY_CARE_PROVIDER_SITE_OTHER): Payer: Medicare Other | Admitting: Nurse Practitioner

## 2022-08-05 ENCOUNTER — Encounter: Payer: Self-pay | Admitting: Nurse Practitioner

## 2022-08-05 VITALS — BP 144/80 | HR 71 | Temp 98.0°F | Ht 74.0 in | Wt 159.0 lb

## 2022-08-05 DIAGNOSIS — Z9359 Other cystostomy status: Secondary | ICD-10-CM | POA: Diagnosis not present

## 2022-08-05 DIAGNOSIS — I10 Essential (primary) hypertension: Secondary | ICD-10-CM | POA: Diagnosis not present

## 2022-08-05 MED ORDER — MUPIROCIN 2 % EX OINT: 1.0000 | TOPICAL_OINTMENT | Freq: Two times a day (BID) | CUTANEOUS | 0 refills | Status: DC

## 2022-08-05 NOTE — Progress Notes (Signed)
Home health nurse reported high bp Saturday and Tuesday unsure of exact numbers.

## 2022-08-05 NOTE — Patient Instructions (Signed)
Goal Blood pressure for Luke Solis is 140/90.  See you in 1-2 weeks for Blood pressure check.  See you soon.  Barbee Shropshire

## 2022-08-05 NOTE — Progress Notes (Signed)
Subjective:    Patient ID: Luke Solis, male    DOB: [birth year], 88 y.o.   MRN: [id number]  HPI  88 year old male patient presents to clinic with his wife.  Patient states that home health nurse reported high bp on Saturday and Tuesday and advised that patient be seen by primary care provider.  Patient and wife are unsure of exact numbers or how high patient's blood pressure was.  Per patient, patient pressure medication was decreased due to orthostatic hypotension  Patient denies any shortness of breath, chest pain, difficulty breathing, headaches, changes to his vision, slurred speech, weakness.  Wife also has a concern about patient's suprapubic catheter.  Patient states that she is noticed a little bit of discharge coming from the catheter and wants to be sure that it is okay.Patient denies any pain or irritation to the area.  Review of Systems  All other systems reviewed and are negative.      Objective:   Physical Exam Vitals reviewed.  Constitutional:      General: He is not in acute distress.    Appearance: Normal appearance. He is normal weight. He is not ill-appearing, toxic-appearing or diaphoretic.  HENT:     Head: Normocephalic and atraumatic.  Cardiovascular:     Rate and Rhythm: Normal rate and regular rhythm.     Pulses: Normal pulses.     Heart sounds: Normal heart sounds. No murmur heard. Pulmonary:     Effort: Pulmonary effort is normal. No respiratory distress.     Breath sounds: Normal breath sounds. No wheezing.  Abdominal:     General: Abdomen is flat. Bowel sounds are normal.     Palpations: Abdomen is soft.     Comments: Suprapubic catheter in place.  Mild redness, however, no discharge, no swelling, no scaling noted.  Musculoskeletal:     Cervical back: Normal range of motion and neck supple. No rigidity or tenderness.     Comments: Grossly intact.  Walks with cane.  Unsteady on feet  Lymphadenopathy:     Cervical: No cervical adenopathy.   Skin:    General: Skin is warm.     Capillary Refill: Capillary refill takes less than 2 seconds.  Neurological:     Mental Status: He is alert.     Sensory: Sensory deficit: .bdragon.     Comments: Grossly intact  Psychiatric:        Mood and Affect: Mood normal.        Behavior: Behavior normal.           Assessment & Plan:    1. Essential hypertension, benign -Blood pressure today 156/78 and 144/80 on recheck.  -Since I do not have the patient's home blood pressures, will not increase patient's amlodipine due to patient's reported history of orthostatic hypotension and fall risk. -Patient and patient's wife provided with blood pressure log to record home blood pressures and to return to clinic in 1-2 weeks for blood pressure check. -If blood pressure severely elevated at that time we will consider increasing amlodipine -Return to clinic in 1-2 weeks  2. Suprapubic catheter ([place]) -We will provide patient with Bactroban ointment to use prophylactically to prevent any infections - mupirocin ointment (BACTROBAN) 2 %; Apply 1 Application topically 2 (two) times daily.  Dispense: 22 g; Refill: 0 -Wash area with warm water -Dry area completely -Return to clinic if area worsens    Note:  This document was prepared using Dragon voice recognition software and may include unintentional  dictation errors. Note - This record has been created using [employer].  Chart creation errors have been sought, but may not always  have been located. Such creation errors do not reflect on  the standard of medical care.

## 2022-08-08 ENCOUNTER — Encounter: Payer: Self-pay | Admitting: Nurse Practitioner

## 2022-08-08 DIAGNOSIS — N1832 Chronic kidney disease, stage 3b: Secondary | ICD-10-CM | POA: Diagnosis not present

## 2022-08-08 DIAGNOSIS — Z87440 Personal history of urinary (tract) infections: Secondary | ICD-10-CM | POA: Diagnosis not present

## 2022-08-08 DIAGNOSIS — E785 Hyperlipidemia, unspecified: Secondary | ICD-10-CM | POA: Diagnosis not present

## 2022-08-08 DIAGNOSIS — Z9181 History of falling: Secondary | ICD-10-CM | POA: Diagnosis not present

## 2022-08-08 DIAGNOSIS — I129 Hypertensive chronic kidney disease with stage 1 through stage 4 chronic kidney disease, or unspecified chronic kidney disease: Secondary | ICD-10-CM | POA: Diagnosis not present

## 2022-08-08 DIAGNOSIS — I2510 Atherosclerotic heart disease of native coronary artery without angina pectoris: Secondary | ICD-10-CM | POA: Diagnosis not present

## 2022-08-08 DIAGNOSIS — Z794 Long term (current) use of insulin: Secondary | ICD-10-CM | POA: Diagnosis not present

## 2022-08-08 DIAGNOSIS — E1142 Type 2 diabetes mellitus with diabetic polyneuropathy: Secondary | ICD-10-CM | POA: Diagnosis not present

## 2022-08-08 DIAGNOSIS — R4189 Other symptoms and signs involving cognitive functions and awareness: Secondary | ICD-10-CM | POA: Diagnosis not present

## 2022-08-08 DIAGNOSIS — I951 Orthostatic hypotension: Secondary | ICD-10-CM | POA: Diagnosis not present

## 2022-08-08 DIAGNOSIS — N35919 Unspecified urethral stricture, male, unspecified site: Secondary | ICD-10-CM | POA: Diagnosis not present

## 2022-08-08 DIAGNOSIS — R296 Repeated falls: Secondary | ICD-10-CM | POA: Diagnosis not present

## 2022-08-08 DIAGNOSIS — G4733 Obstructive sleep apnea (adult) (pediatric): Secondary | ICD-10-CM | POA: Diagnosis not present

## 2022-08-08 DIAGNOSIS — E1122 Type 2 diabetes mellitus with diabetic chronic kidney disease: Secondary | ICD-10-CM | POA: Diagnosis not present

## 2022-08-08 DIAGNOSIS — E1165 Type 2 diabetes mellitus with hyperglycemia: Secondary | ICD-10-CM | POA: Diagnosis not present

## 2022-08-08 DIAGNOSIS — Z8616 Personal history of COVID-19: Secondary | ICD-10-CM | POA: Diagnosis not present

## 2022-08-08 DIAGNOSIS — E113211 Type 2 diabetes mellitus with mild nonproliferative diabetic retinopathy with macular edema, right eye: Secondary | ICD-10-CM | POA: Diagnosis not present

## 2022-08-08 DIAGNOSIS — Z466 Encounter for fitting and adjustment of urinary device: Secondary | ICD-10-CM | POA: Diagnosis not present

## 2022-08-08 DIAGNOSIS — Z8546 Personal history of malignant neoplasm of prostate: Secondary | ICD-10-CM | POA: Diagnosis not present

## 2022-08-08 DIAGNOSIS — Z435 Encounter for attention to cystostomy: Secondary | ICD-10-CM | POA: Diagnosis not present

## 2022-08-08 DIAGNOSIS — Z8673 Personal history of transient ischemic attack (TIA), and cerebral infarction without residual deficits: Secondary | ICD-10-CM | POA: Diagnosis not present

## 2022-08-09 ENCOUNTER — Other Ambulatory Visit: Payer: Self-pay | Admitting: Family Medicine

## 2022-08-19 ENCOUNTER — Ambulatory Visit: Payer: Medicare Other | Admitting: Nurse Practitioner

## 2022-08-24 ENCOUNTER — Ambulatory Visit (INDEPENDENT_AMBULATORY_CARE_PROVIDER_SITE_OTHER): Payer: Medicare Other | Admitting: Family Medicine

## 2022-08-24 VITALS — BP 154/70 | HR 69 | Temp 97.9°F | Ht 74.0 in | Wt 161.0 lb

## 2022-08-24 DIAGNOSIS — Z9359 Other cystostomy status: Secondary | ICD-10-CM | POA: Diagnosis not present

## 2022-08-24 DIAGNOSIS — I10 Essential (primary) hypertension: Secondary | ICD-10-CM

## 2022-08-24 DIAGNOSIS — I2510 Atherosclerotic heart disease of native coronary artery without angina pectoris: Secondary | ICD-10-CM

## 2022-08-24 DIAGNOSIS — Z23 Encounter for immunization: Secondary | ICD-10-CM

## 2022-08-24 DIAGNOSIS — R5383 Other fatigue: Secondary | ICD-10-CM

## 2022-08-24 DIAGNOSIS — E1140 Type 2 diabetes mellitus with diabetic neuropathy, unspecified: Secondary | ICD-10-CM | POA: Diagnosis not present

## 2022-08-24 DIAGNOSIS — Z794 Long term (current) use of insulin: Secondary | ICD-10-CM

## 2022-08-24 DIAGNOSIS — I25119 Atherosclerotic heart disease of native coronary artery with unspecified angina pectoris: Secondary | ICD-10-CM

## 2022-08-24 NOTE — Progress Notes (Signed)
   Subjective:    Patient ID: Luke ERWAY, male    DOB: [birth year], 88 y.o.   MRN: [id number]  HPI  Week follow up for HTN and suprapubic catheter   Brought home cuff 166/100 and 180/89 with facility cuff   Reports some abd pain at times and wife states she noticed blood in catheter  Essential hypertension, benign  Atherosclerosis of native coronary artery of native heart without angina pectoris  Type 2 diabetes mellitus with diabetic neuropathy, with long-term current use of insulin (HCC)  Suprapubic catheter ([place])  Other fatigue  He denies any chest tightness pressure pain or shortness of breath he is able to walk but walks around slowly Eats out a fair amount tries to be relatively healthy diet He has a suprapubic catheter he sees Dr. Rosana Hoes urology for this primarily there is not much they are doing for him currently other than changing the catheter His wife utilizes Bactroban ointment on this area He has not had any dysuria and urinary symptoms other than this no fevers Appetite doing okay The best he can tell blood pressure has been high at home but he denies headaches Has neuropathy in his feet but tolerating okay  States his sugars have been reasonable   Review of Systems     Objective:   Physical Exam Lungs are clear hearts regular pulse normal abdomen is soft indwelling Foley catheter is noted suprapubic has some slight redness at the site Has a clump of pubic hair wrapped around the catheter this had to be carefully cut away  His blood pressure cuff over read by approximately 10 points     Assessment & Plan:  1. Essential hypertension, benign Blood pressure was checked sitting and standing there is a significant drop with standing best reading sitting was 154/70 standing was 138/70 We will stick with the current measures increasing this any further would increase her risk of dizziness and falls which patient has already had 1 in the past year   2.  Atherosclerosis of native coronary artery of native heart without angina pectoris He denies any chest tightness pressure pain or shortness of breath from a cardiac standpoint he is stable no sign of CHF on exam follow-up here in 4 months he already has a follow-up in November this would be fine to keep  3. Type 2 diabetes mellitus with diabetic neuropathy, with long-term current use of insulin ([place]) Diabetes decent control  4. Suprapubic catheter ([place]) No sign of active infection Bactroban ointment to that area would be fine  5. Other fatigue Related to age he is holding his weight trying to eat healthy as best as possible but they eat out a lot Flu shot today

## 2022-08-24 NOTE — Addendum Note (Signed)
Addended by: Anastasio Auerbach R on: 08/24/2022 12:09 PM   Modules accepted: Orders

## 2022-08-30 DIAGNOSIS — R296 Repeated falls: Secondary | ICD-10-CM | POA: Diagnosis not present

## 2022-08-30 DIAGNOSIS — I129 Hypertensive chronic kidney disease with stage 1 through stage 4 chronic kidney disease, or unspecified chronic kidney disease: Secondary | ICD-10-CM | POA: Diagnosis not present

## 2022-08-30 DIAGNOSIS — Z466 Encounter for fitting and adjustment of urinary device: Secondary | ICD-10-CM | POA: Diagnosis not present

## 2022-08-30 DIAGNOSIS — E1122 Type 2 diabetes mellitus with diabetic chronic kidney disease: Secondary | ICD-10-CM | POA: Diagnosis not present

## 2022-08-30 DIAGNOSIS — N35919 Unspecified urethral stricture, male, unspecified site: Secondary | ICD-10-CM | POA: Diagnosis not present

## 2022-08-30 DIAGNOSIS — Z435 Encounter for attention to cystostomy: Secondary | ICD-10-CM | POA: Diagnosis not present

## 2022-09-02 DIAGNOSIS — N35919 Unspecified urethral stricture, male, unspecified site: Secondary | ICD-10-CM | POA: Diagnosis not present

## 2022-09-02 DIAGNOSIS — E1122 Type 2 diabetes mellitus with diabetic chronic kidney disease: Secondary | ICD-10-CM | POA: Diagnosis not present

## 2022-09-02 DIAGNOSIS — Z466 Encounter for fitting and adjustment of urinary device: Secondary | ICD-10-CM | POA: Diagnosis not present

## 2022-09-02 DIAGNOSIS — R296 Repeated falls: Secondary | ICD-10-CM | POA: Diagnosis not present

## 2022-09-02 DIAGNOSIS — Z435 Encounter for attention to cystostomy: Secondary | ICD-10-CM | POA: Diagnosis not present

## 2022-09-02 DIAGNOSIS — I129 Hypertensive chronic kidney disease with stage 1 through stage 4 chronic kidney disease, or unspecified chronic kidney disease: Secondary | ICD-10-CM | POA: Diagnosis not present

## 2022-09-07 DIAGNOSIS — Z8673 Personal history of transient ischemic attack (TIA), and cerebral infarction without residual deficits: Secondary | ICD-10-CM | POA: Diagnosis not present

## 2022-09-07 DIAGNOSIS — Z794 Long term (current) use of insulin: Secondary | ICD-10-CM | POA: Diagnosis not present

## 2022-09-07 DIAGNOSIS — E1122 Type 2 diabetes mellitus with diabetic chronic kidney disease: Secondary | ICD-10-CM | POA: Diagnosis not present

## 2022-09-07 DIAGNOSIS — Z466 Encounter for fitting and adjustment of urinary device: Secondary | ICD-10-CM | POA: Diagnosis not present

## 2022-09-07 DIAGNOSIS — Z87440 Personal history of urinary (tract) infections: Secondary | ICD-10-CM | POA: Diagnosis not present

## 2022-09-07 DIAGNOSIS — R4189 Other symptoms and signs involving cognitive functions and awareness: Secondary | ICD-10-CM | POA: Diagnosis not present

## 2022-09-07 DIAGNOSIS — E1142 Type 2 diabetes mellitus with diabetic polyneuropathy: Secondary | ICD-10-CM | POA: Diagnosis not present

## 2022-09-07 DIAGNOSIS — E113211 Type 2 diabetes mellitus with mild nonproliferative diabetic retinopathy with macular edema, right eye: Secondary | ICD-10-CM | POA: Diagnosis not present

## 2022-09-07 DIAGNOSIS — R296 Repeated falls: Secondary | ICD-10-CM | POA: Diagnosis not present

## 2022-09-07 DIAGNOSIS — E1165 Type 2 diabetes mellitus with hyperglycemia: Secondary | ICD-10-CM | POA: Diagnosis not present

## 2022-09-07 DIAGNOSIS — I2510 Atherosclerotic heart disease of native coronary artery without angina pectoris: Secondary | ICD-10-CM | POA: Diagnosis not present

## 2022-09-07 DIAGNOSIS — Z8546 Personal history of malignant neoplasm of prostate: Secondary | ICD-10-CM | POA: Diagnosis not present

## 2022-09-07 DIAGNOSIS — I129 Hypertensive chronic kidney disease with stage 1 through stage 4 chronic kidney disease, or unspecified chronic kidney disease: Secondary | ICD-10-CM | POA: Diagnosis not present

## 2022-09-07 DIAGNOSIS — I951 Orthostatic hypotension: Secondary | ICD-10-CM | POA: Diagnosis not present

## 2022-09-07 DIAGNOSIS — G4733 Obstructive sleep apnea (adult) (pediatric): Secondary | ICD-10-CM | POA: Diagnosis not present

## 2022-09-07 DIAGNOSIS — N1832 Chronic kidney disease, stage 3b: Secondary | ICD-10-CM | POA: Diagnosis not present

## 2022-09-07 DIAGNOSIS — E785 Hyperlipidemia, unspecified: Secondary | ICD-10-CM | POA: Diagnosis not present

## 2022-09-07 DIAGNOSIS — Z8616 Personal history of COVID-19: Secondary | ICD-10-CM | POA: Diagnosis not present

## 2022-09-07 DIAGNOSIS — Z9181 History of falling: Secondary | ICD-10-CM | POA: Diagnosis not present

## 2022-09-07 DIAGNOSIS — N35919 Unspecified urethral stricture, male, unspecified site: Secondary | ICD-10-CM | POA: Diagnosis not present

## 2022-09-07 DIAGNOSIS — Z435 Encounter for attention to cystostomy: Secondary | ICD-10-CM | POA: Diagnosis not present

## 2022-09-08 ENCOUNTER — Other Ambulatory Visit: Payer: Self-pay

## 2022-09-08 ENCOUNTER — Other Ambulatory Visit: Payer: Self-pay | Admitting: Family Medicine

## 2022-09-10 MED ORDER — PREGABALIN 25 MG PO CAPS
ORAL_CAPSULE | ORAL | 5 refills | Status: DC
Start: 2022-09-10 — End: 2022-09-13

## 2022-09-13 ENCOUNTER — Other Ambulatory Visit: Payer: Self-pay

## 2022-09-13 MED ORDER — FREESTYLE LIBRE 2 SENSOR MISC: 12 refills | Status: DC

## 2022-09-13 MED ORDER — PREGABALIN 25 MG PO CAPS: ORAL_CAPSULE | ORAL | 5 refills | Status: DC

## 2022-09-23 ENCOUNTER — Other Ambulatory Visit: Payer: Self-pay | Admitting: Family Medicine

## 2022-09-27 ENCOUNTER — Ambulatory Visit (INDEPENDENT_AMBULATORY_CARE_PROVIDER_SITE_OTHER): Payer: Medicare Other | Admitting: Family Medicine

## 2022-09-27 VITALS — BP 130/78

## 2022-09-27 DIAGNOSIS — E785 Hyperlipidemia, unspecified: Secondary | ICD-10-CM | POA: Diagnosis not present

## 2022-09-27 DIAGNOSIS — N1832 Chronic kidney disease, stage 3b: Secondary | ICD-10-CM | POA: Diagnosis not present

## 2022-09-27 DIAGNOSIS — S8001XA Contusion of right knee, initial encounter: Secondary | ICD-10-CM

## 2022-09-27 DIAGNOSIS — Z794 Long term (current) use of insulin: Secondary | ICD-10-CM | POA: Diagnosis not present

## 2022-09-27 DIAGNOSIS — E1140 Type 2 diabetes mellitus with diabetic neuropathy, unspecified: Secondary | ICD-10-CM | POA: Diagnosis not present

## 2022-09-27 DIAGNOSIS — I25119 Atherosclerotic heart disease of native coronary artery with unspecified angina pectoris: Secondary | ICD-10-CM

## 2022-09-27 NOTE — Progress Notes (Signed)
   Subjective:    Patient ID: Luke Solis, male    DOB: 10-Jan-1931, 88 y.o.   MRN: [id number]  HPI Patient took a fall at home landed on carpet did not strike his head no LOC relates some soreness to his back into his right knee but is able to walk with his walker without too much difficulty   Review of Systems     Objective:   Physical Exam Neck nontender head nontender no edema noted lungs clear heart regular pulse normal BP sitting standing good       Assessment & Plan:  Georgina Peer we will contusion should gradually get better warning signs regarding serious injury discussed follow-up if progressive troubles.  Hopefully no more falls No x-rays are indicated If any further falls recommend physical therapy

## 2022-09-28 DIAGNOSIS — H3561 Retinal hemorrhage, right eye: Secondary | ICD-10-CM | POA: Diagnosis not present

## 2022-09-28 DIAGNOSIS — H40013 Open angle with borderline findings, low risk, bilateral: Secondary | ICD-10-CM | POA: Diagnosis not present

## 2022-09-28 DIAGNOSIS — H353211 Exudative age-related macular degeneration, right eye, with active choroidal neovascularization: Secondary | ICD-10-CM | POA: Diagnosis not present

## 2022-09-28 DIAGNOSIS — H02834 Dermatochalasis of left upper eyelid: Secondary | ICD-10-CM | POA: Diagnosis not present

## 2022-09-28 DIAGNOSIS — Z961 Presence of intraocular lens: Secondary | ICD-10-CM | POA: Diagnosis not present

## 2022-09-28 DIAGNOSIS — E119 Type 2 diabetes mellitus without complications: Secondary | ICD-10-CM | POA: Diagnosis not present

## 2022-09-28 DIAGNOSIS — H43813 Vitreous degeneration, bilateral: Secondary | ICD-10-CM | POA: Diagnosis not present

## 2022-09-28 DIAGNOSIS — H02831 Dermatochalasis of right upper eyelid: Secondary | ICD-10-CM | POA: Diagnosis not present

## 2022-09-29 IMAGING — US US EXTREM LOW VENOUS*R*
1 series · 13 of 24 positions shown · non-contrast
Comparison: None.

CLINICAL DATA: Right lower extremity leg and foot pain



[Series 1: us venous img lower uni right (dvt) · portal-venous · 13 of 41 slices shown]
[im 1/41]
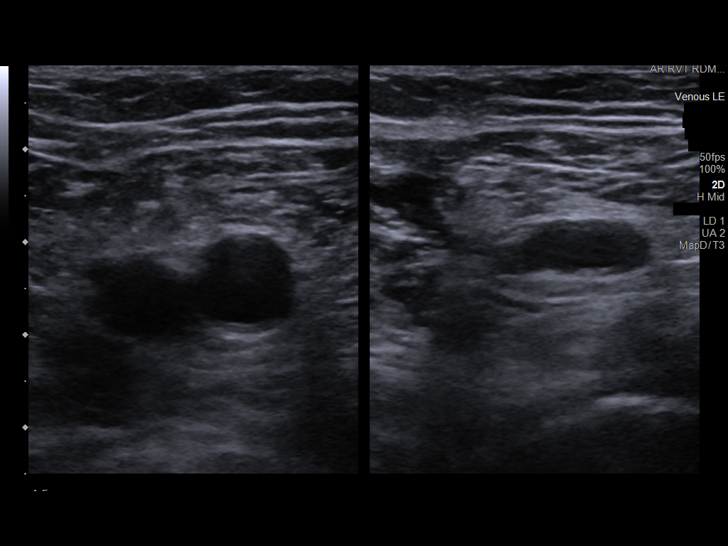
[im 4/41]
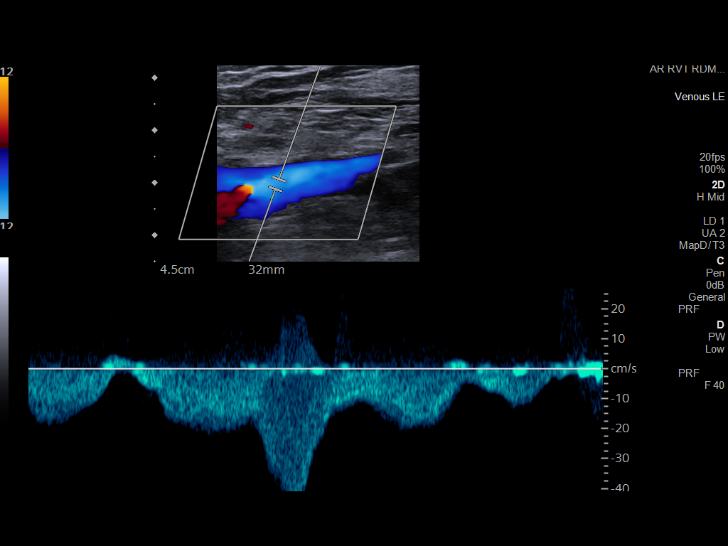
[im 7/41]
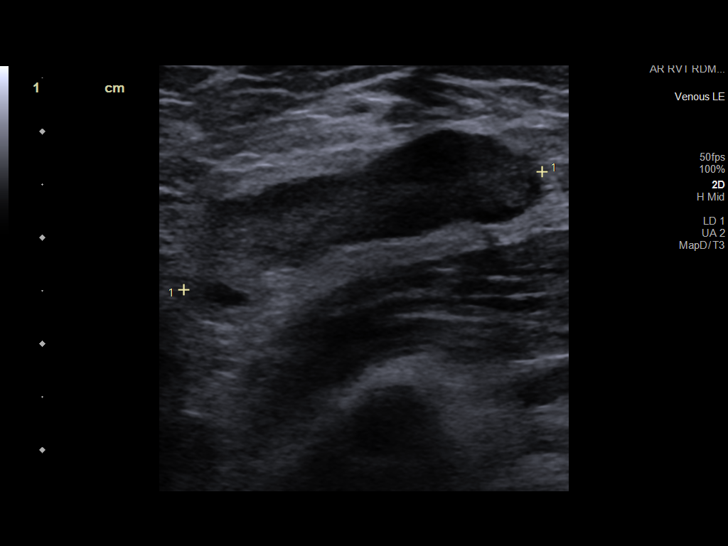
[im 11/41]
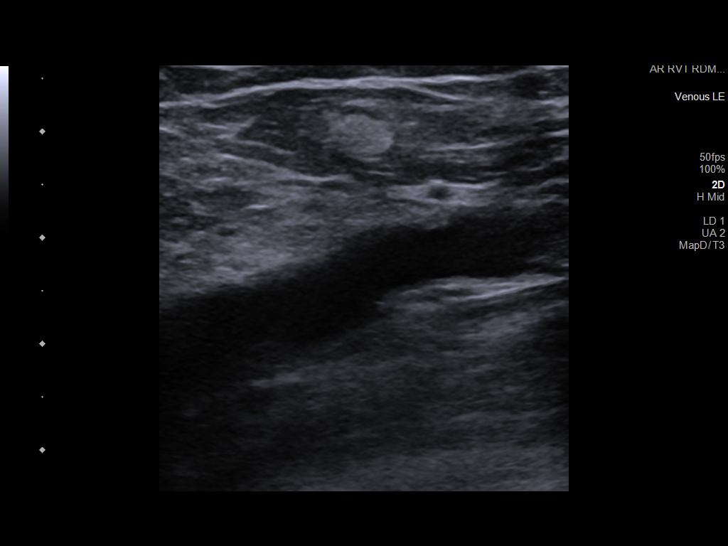
[im 14/41]
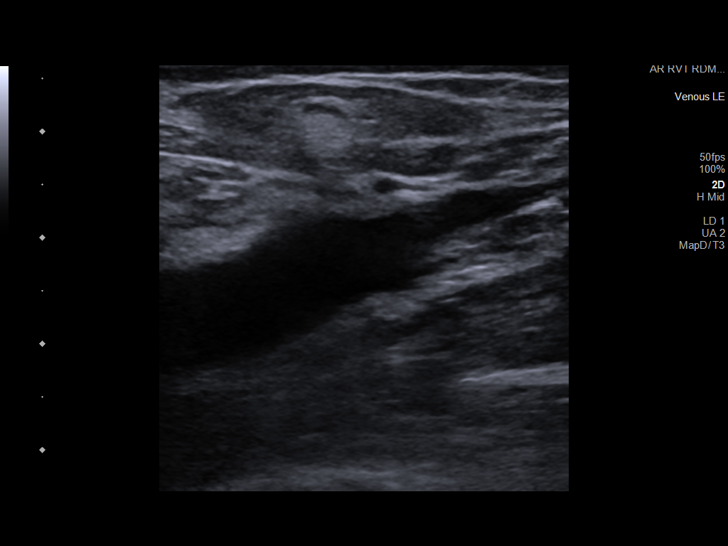
[im 18/41]
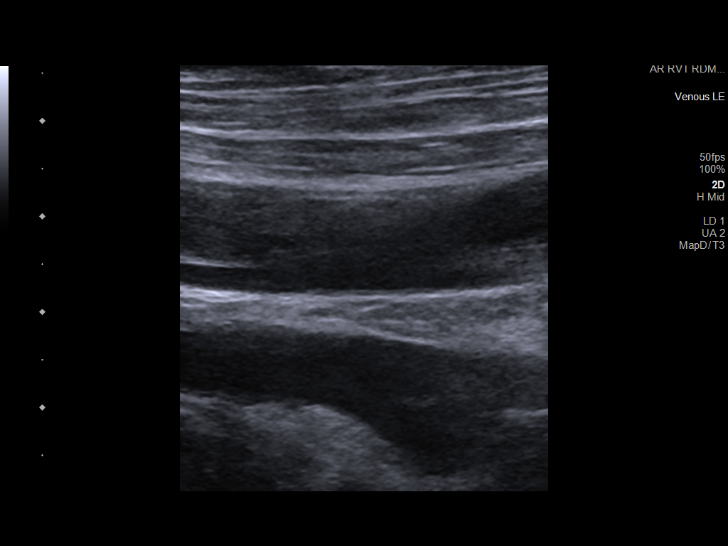
[im 21/41]
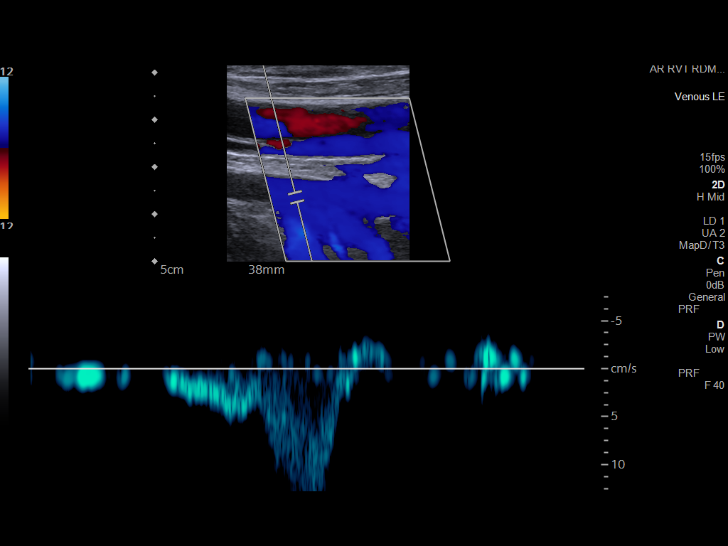
[im 23/41]
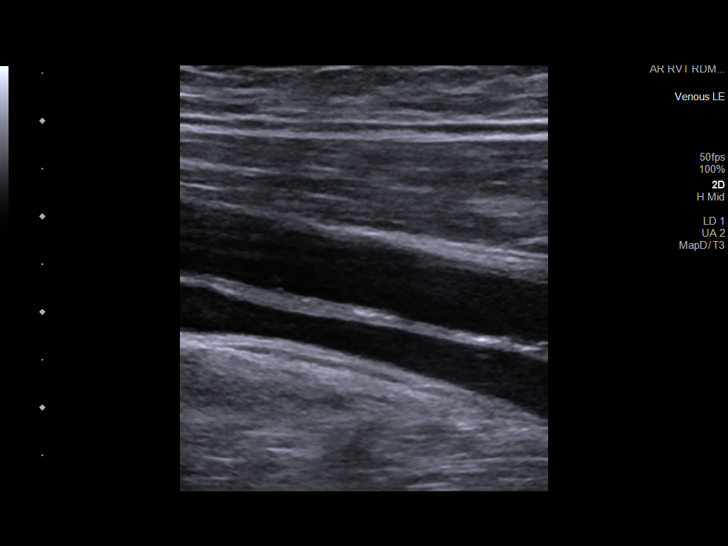
[im 27/41]
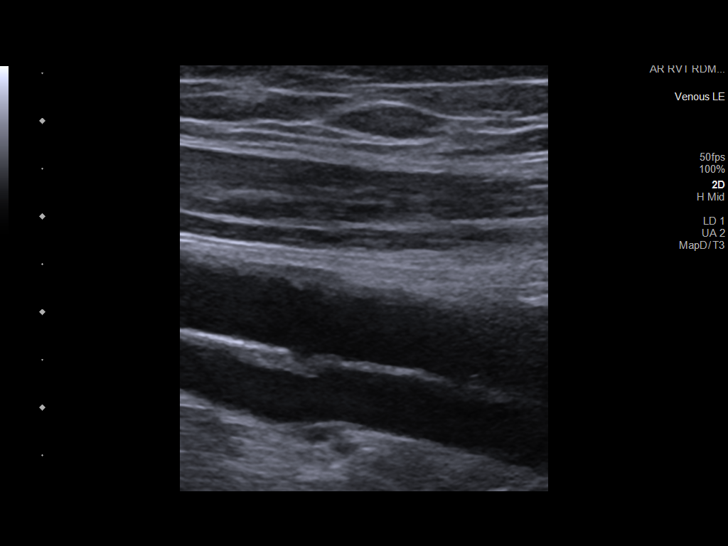
[im 30/41]
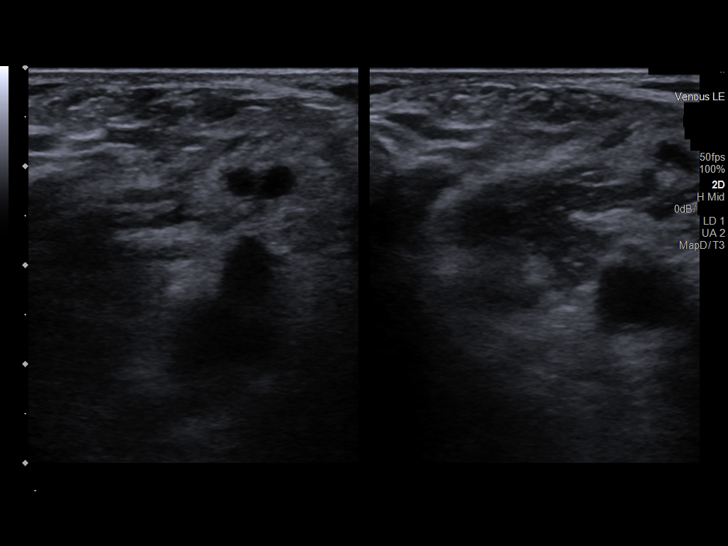
[im 34/41]
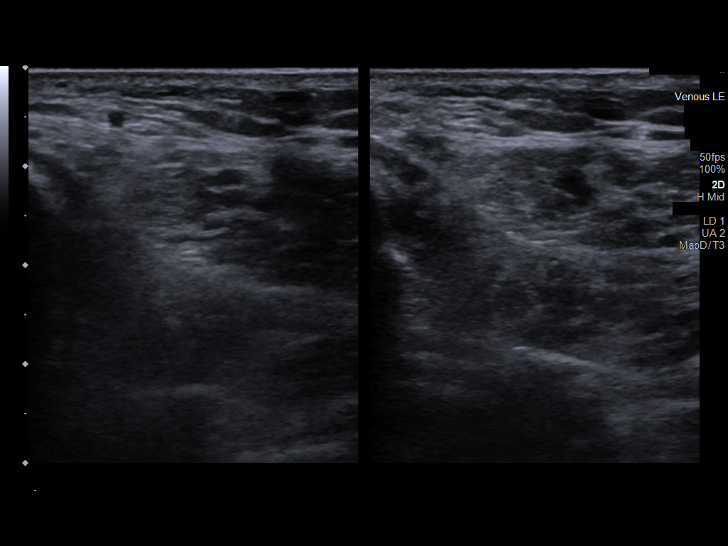
[im 37/41]
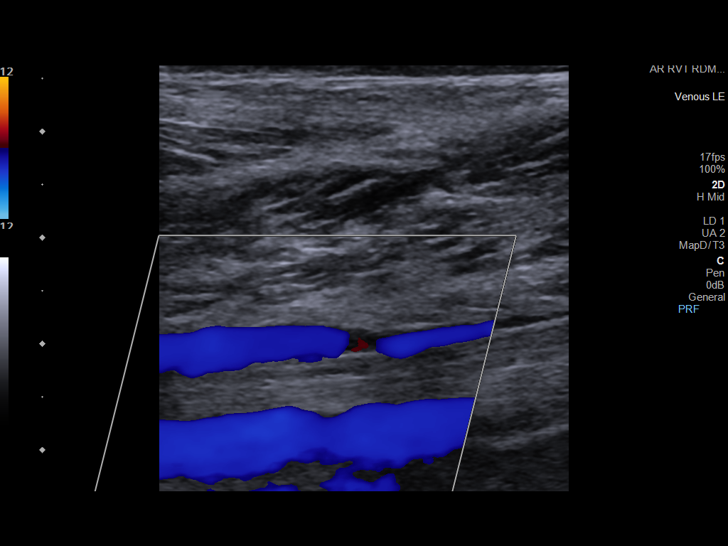
[im 41/41]
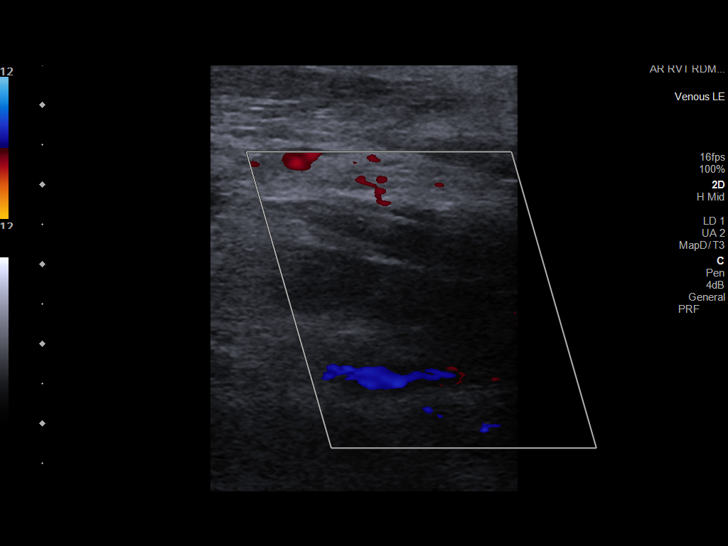

[13 of 24 positions shown; findings below may reference images not displayed]

FINDINGS: Contralateral Common Femoral Vein: Respiratory phasicity is normal
and symmetric with the symptomatic side. No evidence of thrombus.
Normal compressibility.

Common Femoral Vein: No evidence of thrombus. Normal
compressibility, respiratory phasicity and response to augmentation.

Saphenofemoral Junction: No evidence of thrombus. Normal
compressibility and flow on color Doppler imaging.

Profunda Femoral Vein: No evidence of thrombus. Normal
compressibility and flow on color Doppler imaging.

Femoral Vein: No evidence of thrombus. Normal compressibility,
respiratory phasicity and response to augmentation.

Popliteal Vein: No evidence of thrombus. Normal compressibility,
respiratory phasicity and response to augmentation.

Calf Veins: No evidence of thrombus. Normal compressibility and flow
on color Doppler imaging.

Other Findings: Elongated mildly enlarged right inguinal lymph node
measures 3.6 cm in length but only 0.9 cm in short axis. This is
favored to be chronic reactive/inflammatory in nature.
IMPRESSION: No evidence of deep venous thrombosis.

## 2022-09-30 DIAGNOSIS — N35919 Unspecified urethral stricture, male, unspecified site: Secondary | ICD-10-CM | POA: Diagnosis not present

## 2022-09-30 DIAGNOSIS — Z435 Encounter for attention to cystostomy: Secondary | ICD-10-CM | POA: Diagnosis not present

## 2022-09-30 DIAGNOSIS — R296 Repeated falls: Secondary | ICD-10-CM | POA: Diagnosis not present

## 2022-09-30 DIAGNOSIS — I129 Hypertensive chronic kidney disease with stage 1 through stage 4 chronic kidney disease, or unspecified chronic kidney disease: Secondary | ICD-10-CM | POA: Diagnosis not present

## 2022-09-30 DIAGNOSIS — E1122 Type 2 diabetes mellitus with diabetic chronic kidney disease: Secondary | ICD-10-CM | POA: Diagnosis not present

## 2022-09-30 DIAGNOSIS — Z466 Encounter for fitting and adjustment of urinary device: Secondary | ICD-10-CM | POA: Diagnosis not present

## 2022-10-01 LAB — MICROALBUMIN / CREATININE URINE RATIO

## 2022-10-01 LAB — LIPID PANEL
Chol/HDL Ratio: 4 ratio (ref 0.0–5.0)
Cholesterol, Total: 178 mg/dL (ref 100–199)
HDL: 45 mg/dL (ref >39)
LDL Chol Calc (NIH): 103 mg/dL — ABNORMAL HIGH (ref 0–99)
Triglycerides: 175 mg/dL — ABNORMAL HIGH (ref 0–149)
VLDL Cholesterol Cal: 30 mg/dL (ref 5–40)

## 2022-10-01 LAB — BASIC METABOLIC PANEL
BUN/Creatinine Ratio: 15 (ref 10–24)
BUN: 22 mg/dL (ref 8–27)
CO2: 23 mmol/L (ref 20–29)
Calcium: 9.3 mg/dL (ref 8.6–10.2)
Chloride: 106 mmol/L (ref 96–106)
Creatinine, Ser: 1.47 mg/dL — ABNORMAL HIGH (ref 0.76–1.27)
Glucose: 203 mg/dL — ABNORMAL HIGH (ref 70–99)
Potassium: 4.1 mmol/L (ref 3.5–5.2)
Sodium: 140 mmol/L (ref 134–144)
eGFR: 45 mL/min/{1.73_m2} — ABNORMAL LOW (ref >59)

## 2022-10-01 LAB — HEPATIC FUNCTION PANEL
ALT: 9 IU/L (ref 0–44)
AST: 11 IU/L (ref 0–40)
Albumin: 4.2 g/dL (ref 3.7–4.7)
Alkaline Phosphatase: 82 IU/L (ref 44–121)
Bilirubin Total: 0.2 mg/dL (ref 0.0–1.2)
Bilirubin, Direct: <0.1 mg/dL (ref 0.00–0.40)
Total Protein: 7 g/dL (ref 6.0–8.5)

## 2022-10-01 LAB — HEMOGLOBIN A1C
Est. average glucose Bld gHb Est-mCnc: 166 mg/dL
Hgb A1c MFr Bld: 7.4 % — ABNORMAL HIGH (ref 4.8–5.6)

## 2022-10-02 ENCOUNTER — Encounter: Payer: Self-pay | Admitting: Family Medicine

## 2022-10-02 NOTE — Progress Notes (Signed)
Please print this letter for me to write a couple handwritten notes on then we will mail it to the patient thank you

## 2022-10-03 ENCOUNTER — Other Ambulatory Visit: Payer: Self-pay | Admitting: Family Medicine

## 2022-10-04 ENCOUNTER — Other Ambulatory Visit: Payer: Self-pay | Admitting: Family Medicine

## 2022-10-04 DIAGNOSIS — I129 Hypertensive chronic kidney disease with stage 1 through stage 4 chronic kidney disease, or unspecified chronic kidney disease: Secondary | ICD-10-CM | POA: Diagnosis not present

## 2022-10-04 DIAGNOSIS — R296 Repeated falls: Secondary | ICD-10-CM | POA: Diagnosis not present

## 2022-10-04 DIAGNOSIS — Z466 Encounter for fitting and adjustment of urinary device: Secondary | ICD-10-CM | POA: Diagnosis not present

## 2022-10-04 DIAGNOSIS — Z435 Encounter for attention to cystostomy: Secondary | ICD-10-CM | POA: Diagnosis not present

## 2022-10-04 DIAGNOSIS — N35919 Unspecified urethral stricture, male, unspecified site: Secondary | ICD-10-CM | POA: Diagnosis not present

## 2022-10-04 DIAGNOSIS — E1122 Type 2 diabetes mellitus with diabetic chronic kidney disease: Secondary | ICD-10-CM | POA: Diagnosis not present

## 2022-10-06 DIAGNOSIS — M79671 Pain in right foot: Secondary | ICD-10-CM | POA: Diagnosis not present

## 2022-10-06 DIAGNOSIS — L609 Nail disorder, unspecified: Secondary | ICD-10-CM | POA: Diagnosis not present

## 2022-10-06 DIAGNOSIS — E1140 Type 2 diabetes mellitus with diabetic neuropathy, unspecified: Secondary | ICD-10-CM | POA: Diagnosis not present

## 2022-10-06 DIAGNOSIS — M79672 Pain in left foot: Secondary | ICD-10-CM | POA: Diagnosis not present

## 2022-10-07 DIAGNOSIS — R296 Repeated falls: Secondary | ICD-10-CM | POA: Diagnosis not present

## 2022-10-07 DIAGNOSIS — E1165 Type 2 diabetes mellitus with hyperglycemia: Secondary | ICD-10-CM | POA: Diagnosis not present

## 2022-10-07 DIAGNOSIS — E1142 Type 2 diabetes mellitus with diabetic polyneuropathy: Secondary | ICD-10-CM | POA: Diagnosis not present

## 2022-10-07 DIAGNOSIS — Z8673 Personal history of transient ischemic attack (TIA), and cerebral infarction without residual deficits: Secondary | ICD-10-CM | POA: Diagnosis not present

## 2022-10-07 DIAGNOSIS — Z87440 Personal history of urinary (tract) infections: Secondary | ICD-10-CM | POA: Diagnosis not present

## 2022-10-07 DIAGNOSIS — Z9181 History of falling: Secondary | ICD-10-CM | POA: Diagnosis not present

## 2022-10-07 DIAGNOSIS — G4733 Obstructive sleep apnea (adult) (pediatric): Secondary | ICD-10-CM | POA: Diagnosis not present

## 2022-10-07 DIAGNOSIS — Z794 Long term (current) use of insulin: Secondary | ICD-10-CM | POA: Diagnosis not present

## 2022-10-07 DIAGNOSIS — E113211 Type 2 diabetes mellitus with mild nonproliferative diabetic retinopathy with macular edema, right eye: Secondary | ICD-10-CM | POA: Diagnosis not present

## 2022-10-07 DIAGNOSIS — N1832 Chronic kidney disease, stage 3b: Secondary | ICD-10-CM | POA: Diagnosis not present

## 2022-10-07 DIAGNOSIS — Z435 Encounter for attention to cystostomy: Secondary | ICD-10-CM | POA: Diagnosis not present

## 2022-10-07 DIAGNOSIS — Z466 Encounter for fitting and adjustment of urinary device: Secondary | ICD-10-CM | POA: Diagnosis not present

## 2022-10-07 DIAGNOSIS — Z8546 Personal history of malignant neoplasm of prostate: Secondary | ICD-10-CM | POA: Diagnosis not present

## 2022-10-07 DIAGNOSIS — R4189 Other symptoms and signs involving cognitive functions and awareness: Secondary | ICD-10-CM | POA: Diagnosis not present

## 2022-10-07 DIAGNOSIS — I129 Hypertensive chronic kidney disease with stage 1 through stage 4 chronic kidney disease, or unspecified chronic kidney disease: Secondary | ICD-10-CM | POA: Diagnosis not present

## 2022-10-07 DIAGNOSIS — E785 Hyperlipidemia, unspecified: Secondary | ICD-10-CM | POA: Diagnosis not present

## 2022-10-07 DIAGNOSIS — I951 Orthostatic hypotension: Secondary | ICD-10-CM | POA: Diagnosis not present

## 2022-10-07 DIAGNOSIS — E1122 Type 2 diabetes mellitus with diabetic chronic kidney disease: Secondary | ICD-10-CM | POA: Diagnosis not present

## 2022-10-07 DIAGNOSIS — N35919 Unspecified urethral stricture, male, unspecified site: Secondary | ICD-10-CM | POA: Diagnosis not present

## 2022-10-07 DIAGNOSIS — I2510 Atherosclerotic heart disease of native coronary artery without angina pectoris: Secondary | ICD-10-CM | POA: Diagnosis not present

## 2022-10-07 DIAGNOSIS — Z8616 Personal history of COVID-19: Secondary | ICD-10-CM | POA: Diagnosis not present

## 2022-10-11 DIAGNOSIS — H43813 Vitreous degeneration, bilateral: Secondary | ICD-10-CM | POA: Diagnosis not present

## 2022-10-11 DIAGNOSIS — H353122 Nonexudative age-related macular degeneration, left eye, intermediate dry stage: Secondary | ICD-10-CM | POA: Diagnosis not present

## 2022-10-11 DIAGNOSIS — H35372 Puckering of macula, left eye: Secondary | ICD-10-CM | POA: Diagnosis not present

## 2022-10-11 DIAGNOSIS — H353211 Exudative age-related macular degeneration, right eye, with active choroidal neovascularization: Secondary | ICD-10-CM | POA: Diagnosis not present

## 2022-10-18 ENCOUNTER — Ambulatory Visit (INDEPENDENT_AMBULATORY_CARE_PROVIDER_SITE_OTHER): Payer: Medicare Other | Admitting: Family Medicine

## 2022-10-18 VITALS — BP 146/70 | HR 69 | Temp 97.9°F | Ht 74.0 in | Wt 161.0 lb

## 2022-10-18 DIAGNOSIS — R296 Repeated falls: Secondary | ICD-10-CM

## 2022-10-18 DIAGNOSIS — E1140 Type 2 diabetes mellitus with diabetic neuropathy, unspecified: Secondary | ICD-10-CM | POA: Diagnosis not present

## 2022-10-18 DIAGNOSIS — I25119 Atherosclerotic heart disease of native coronary artery with unspecified angina pectoris: Secondary | ICD-10-CM

## 2022-10-18 DIAGNOSIS — N183 Chronic kidney disease, stage 3 unspecified: Secondary | ICD-10-CM

## 2022-10-18 DIAGNOSIS — Z794 Long term (current) use of insulin: Secondary | ICD-10-CM | POA: Diagnosis not present

## 2022-10-18 DIAGNOSIS — R531 Weakness: Secondary | ICD-10-CM

## 2022-10-18 MED ORDER — LANTUS SOLOSTAR 100 UNIT/ML ~~LOC~~ SOPN: 20.0000 [IU] | PEN_INJECTOR | Freq: Every day | SUBCUTANEOUS | 0 refills | Status: DC

## 2022-10-18 MED ORDER — LANTUS SOLOSTAR 100 UNIT/ML ~~LOC~~ SOPN: PEN_INJECTOR | SUBCUTANEOUS | 1 refills | Status: DC

## 2022-10-18 MED ORDER — INSULIN LISPRO (1 UNIT DIAL) 100 UNIT/ML (KWIKPEN): PEN_INJECTOR | SUBCUTANEOUS | 1 refills | Status: DC

## 2022-10-18 NOTE — Progress Notes (Signed)
   Subjective:    Patient ID: Luke Solis, male    DOB: 03/31/1931, 88 y.o.   MRN: [id number]  Diabetes He presents for his follow-up diabetic visit. He has type 2 diabetes mellitus. Current diabetic treatments: lantus, humalog.  Hypertension This is a chronic problem. Treatments tried: amlodipine.  Patient does have some weakness in the legs as well as some intermittent falling that is associated with his advancing age He utilizes his walker when possible other times when he is feeling well he will use his cane Dietary doing fairly well but does consume too much carbohydrates     Review of Systems     Objective:   Physical Exam  General-in no acute distress Eyes-no discharge Lungs-respiratory rate normal, CTA CV-no murmurs,RRR Extremities skin warm dry no edema Neuro grossly normal Behavior normal, alert Patient does have some mild neuropathy in the feet worse on the right than the left      Assessment & Plan:  Patient will bring urine back for urine ACR Mild neuropathy issues on medications Labs were reviewed with the patient today A1c under reasonable control given his age Blood pressure good control considering his age Blood pressure does drop with standing We did adjust the sliding scale Healthy diet recommended Avoid excessively high sugars Keep long-acting where it is currently to minimize risk of low sugars

## 2022-10-19 LAB — MICROALBUMIN / CREATININE URINE RATIO
Creatinine, Urine: 169.3 mg/dL
Microalb/Creat Ratio: 328 mg/g creat — ABNORMAL HIGH (ref 0–29)
Microalbumin, Urine: 555.4 ug/mL

## 2022-10-19 LAB — SPECIMEN STATUS REPORT

## 2022-10-29 ENCOUNTER — Other Ambulatory Visit: Payer: Self-pay | Admitting: Family Medicine

## 2022-11-01 DIAGNOSIS — Z435 Encounter for attention to cystostomy: Secondary | ICD-10-CM | POA: Diagnosis not present

## 2022-11-01 DIAGNOSIS — R296 Repeated falls: Secondary | ICD-10-CM | POA: Diagnosis not present

## 2022-11-01 DIAGNOSIS — Z466 Encounter for fitting and adjustment of urinary device: Secondary | ICD-10-CM | POA: Diagnosis not present

## 2022-11-01 DIAGNOSIS — N35919 Unspecified urethral stricture, male, unspecified site: Secondary | ICD-10-CM | POA: Diagnosis not present

## 2022-11-01 DIAGNOSIS — E1122 Type 2 diabetes mellitus with diabetic chronic kidney disease: Secondary | ICD-10-CM | POA: Diagnosis not present

## 2022-11-01 DIAGNOSIS — I129 Hypertensive chronic kidney disease with stage 1 through stage 4 chronic kidney disease, or unspecified chronic kidney disease: Secondary | ICD-10-CM | POA: Diagnosis not present

## 2022-11-03 DIAGNOSIS — E1122 Type 2 diabetes mellitus with diabetic chronic kidney disease: Secondary | ICD-10-CM

## 2022-11-03 DIAGNOSIS — R296 Repeated falls: Secondary | ICD-10-CM | POA: Diagnosis not present

## 2022-11-03 DIAGNOSIS — Z435 Encounter for attention to cystostomy: Secondary | ICD-10-CM | POA: Diagnosis not present

## 2022-11-03 DIAGNOSIS — N1832 Chronic kidney disease, stage 3b: Secondary | ICD-10-CM

## 2022-11-03 DIAGNOSIS — N35919 Unspecified urethral stricture, male, unspecified site: Secondary | ICD-10-CM | POA: Diagnosis not present

## 2022-11-03 DIAGNOSIS — I129 Hypertensive chronic kidney disease with stage 1 through stage 4 chronic kidney disease, or unspecified chronic kidney disease: Secondary | ICD-10-CM

## 2022-11-03 DIAGNOSIS — Z466 Encounter for fitting and adjustment of urinary device: Secondary | ICD-10-CM | POA: Diagnosis not present

## 2022-11-04 NOTE — Addendum Note (Signed)
Addended by: Coralyn Pear S on: 11/04/2022 12:00 PM   Modules accepted: Orders

## 2022-11-06 DIAGNOSIS — E1142 Type 2 diabetes mellitus with diabetic polyneuropathy: Secondary | ICD-10-CM | POA: Diagnosis not present

## 2022-11-06 DIAGNOSIS — Z8616 Personal history of COVID-19: Secondary | ICD-10-CM | POA: Diagnosis not present

## 2022-11-06 DIAGNOSIS — Z794 Long term (current) use of insulin: Secondary | ICD-10-CM | POA: Diagnosis not present

## 2022-11-06 DIAGNOSIS — I129 Hypertensive chronic kidney disease with stage 1 through stage 4 chronic kidney disease, or unspecified chronic kidney disease: Secondary | ICD-10-CM | POA: Diagnosis not present

## 2022-11-06 DIAGNOSIS — E785 Hyperlipidemia, unspecified: Secondary | ICD-10-CM | POA: Diagnosis not present

## 2022-11-06 DIAGNOSIS — R4189 Other symptoms and signs involving cognitive functions and awareness: Secondary | ICD-10-CM | POA: Diagnosis not present

## 2022-11-06 DIAGNOSIS — E1165 Type 2 diabetes mellitus with hyperglycemia: Secondary | ICD-10-CM | POA: Diagnosis not present

## 2022-11-06 DIAGNOSIS — Z87440 Personal history of urinary (tract) infections: Secondary | ICD-10-CM | POA: Diagnosis not present

## 2022-11-06 DIAGNOSIS — Z8673 Personal history of transient ischemic attack (TIA), and cerebral infarction without residual deficits: Secondary | ICD-10-CM | POA: Diagnosis not present

## 2022-11-06 DIAGNOSIS — R296 Repeated falls: Secondary | ICD-10-CM | POA: Diagnosis not present

## 2022-11-06 DIAGNOSIS — Z9181 History of falling: Secondary | ICD-10-CM | POA: Diagnosis not present

## 2022-11-06 DIAGNOSIS — Z466 Encounter for fitting and adjustment of urinary device: Secondary | ICD-10-CM | POA: Diagnosis not present

## 2022-11-06 DIAGNOSIS — E1122 Type 2 diabetes mellitus with diabetic chronic kidney disease: Secondary | ICD-10-CM | POA: Diagnosis not present

## 2022-11-06 DIAGNOSIS — N35919 Unspecified urethral stricture, male, unspecified site: Secondary | ICD-10-CM | POA: Diagnosis not present

## 2022-11-06 DIAGNOSIS — Z8546 Personal history of malignant neoplasm of prostate: Secondary | ICD-10-CM | POA: Diagnosis not present

## 2022-11-06 DIAGNOSIS — I2510 Atherosclerotic heart disease of native coronary artery without angina pectoris: Secondary | ICD-10-CM | POA: Diagnosis not present

## 2022-11-06 DIAGNOSIS — E113211 Type 2 diabetes mellitus with mild nonproliferative diabetic retinopathy with macular edema, right eye: Secondary | ICD-10-CM | POA: Diagnosis not present

## 2022-11-06 DIAGNOSIS — N1832 Chronic kidney disease, stage 3b: Secondary | ICD-10-CM | POA: Diagnosis not present

## 2022-11-06 DIAGNOSIS — I951 Orthostatic hypotension: Secondary | ICD-10-CM | POA: Diagnosis not present

## 2022-11-06 DIAGNOSIS — G4733 Obstructive sleep apnea (adult) (pediatric): Secondary | ICD-10-CM | POA: Diagnosis not present

## 2022-11-06 DIAGNOSIS — Z435 Encounter for attention to cystostomy: Secondary | ICD-10-CM | POA: Diagnosis not present

## 2022-11-26 ENCOUNTER — Other Ambulatory Visit: Payer: Self-pay | Admitting: Family Medicine

## 2022-11-26 DIAGNOSIS — N35919 Unspecified urethral stricture, male, unspecified site: Secondary | ICD-10-CM | POA: Diagnosis not present

## 2022-11-26 DIAGNOSIS — Z435 Encounter for attention to cystostomy: Secondary | ICD-10-CM | POA: Diagnosis not present

## 2022-11-26 DIAGNOSIS — E1122 Type 2 diabetes mellitus with diabetic chronic kidney disease: Secondary | ICD-10-CM | POA: Diagnosis not present

## 2022-11-26 DIAGNOSIS — I129 Hypertensive chronic kidney disease with stage 1 through stage 4 chronic kidney disease, or unspecified chronic kidney disease: Secondary | ICD-10-CM | POA: Diagnosis not present

## 2022-11-26 DIAGNOSIS — Z466 Encounter for fitting and adjustment of urinary device: Secondary | ICD-10-CM | POA: Diagnosis not present

## 2022-11-26 DIAGNOSIS — R296 Repeated falls: Secondary | ICD-10-CM | POA: Diagnosis not present

## 2022-12-03 ENCOUNTER — Telehealth: Payer: Self-pay | Admitting: *Deleted

## 2022-12-03 ENCOUNTER — Other Ambulatory Visit: Payer: Self-pay | Admitting: Family Medicine

## 2022-12-03 MED ORDER — CEFDINIR 300 MG PO CAPS: 300.0000 mg | ORAL_CAPSULE | Freq: Two times a day (BID) | ORAL | 0 refills | Status: DC

## 2022-12-03 NOTE — Telephone Encounter (Signed)
Lee from home health called and stated patient having pelvic and penile pain that is not relieved with Tylenol- had a history of this in past- has a suprapubic catheter that was changed 11/30/22 and home health would like a call with what can be done for this patient - wife is sick with RSV  Truman Hayward [phone]

## 2022-12-03 NOTE — Telephone Encounter (Signed)
Home health nurse notified of provider's recommendations and stated she will let patient know

## 2022-12-03 NOTE — Telephone Encounter (Signed)
Coral Spikes, DO     Recommend contacting Urology. I am not able to address this without evaluating the patient.

## 2022-12-04 DIAGNOSIS — N35919 Unspecified urethral stricture, male, unspecified site: Secondary | ICD-10-CM | POA: Diagnosis not present

## 2022-12-04 DIAGNOSIS — Z466 Encounter for fitting and adjustment of urinary device: Secondary | ICD-10-CM | POA: Diagnosis not present

## 2022-12-04 DIAGNOSIS — Z435 Encounter for attention to cystostomy: Secondary | ICD-10-CM | POA: Diagnosis not present

## 2022-12-04 DIAGNOSIS — R296 Repeated falls: Secondary | ICD-10-CM | POA: Diagnosis not present

## 2022-12-04 DIAGNOSIS — I129 Hypertensive chronic kidney disease with stage 1 through stage 4 chronic kidney disease, or unspecified chronic kidney disease: Secondary | ICD-10-CM | POA: Diagnosis not present

## 2022-12-04 DIAGNOSIS — E1122 Type 2 diabetes mellitus with diabetic chronic kidney disease: Secondary | ICD-10-CM | POA: Diagnosis not present

## 2022-12-06 DIAGNOSIS — N1832 Chronic kidney disease, stage 3b: Secondary | ICD-10-CM | POA: Diagnosis not present

## 2022-12-06 DIAGNOSIS — Z8616 Personal history of COVID-19: Secondary | ICD-10-CM | POA: Diagnosis not present

## 2022-12-06 DIAGNOSIS — Z87440 Personal history of urinary (tract) infections: Secondary | ICD-10-CM | POA: Diagnosis not present

## 2022-12-06 DIAGNOSIS — G4733 Obstructive sleep apnea (adult) (pediatric): Secondary | ICD-10-CM | POA: Diagnosis not present

## 2022-12-06 DIAGNOSIS — Z9181 History of falling: Secondary | ICD-10-CM | POA: Diagnosis not present

## 2022-12-06 DIAGNOSIS — Z8673 Personal history of transient ischemic attack (TIA), and cerebral infarction without residual deficits: Secondary | ICD-10-CM | POA: Diagnosis not present

## 2022-12-06 DIAGNOSIS — R296 Repeated falls: Secondary | ICD-10-CM | POA: Diagnosis not present

## 2022-12-06 DIAGNOSIS — Z435 Encounter for attention to cystostomy: Secondary | ICD-10-CM | POA: Diagnosis not present

## 2022-12-06 DIAGNOSIS — N35919 Unspecified urethral stricture, male, unspecified site: Secondary | ICD-10-CM | POA: Diagnosis not present

## 2022-12-06 DIAGNOSIS — Z8546 Personal history of malignant neoplasm of prostate: Secondary | ICD-10-CM | POA: Diagnosis not present

## 2022-12-06 DIAGNOSIS — I951 Orthostatic hypotension: Secondary | ICD-10-CM | POA: Diagnosis not present

## 2022-12-06 DIAGNOSIS — E113211 Type 2 diabetes mellitus with mild nonproliferative diabetic retinopathy with macular edema, right eye: Secondary | ICD-10-CM | POA: Diagnosis not present

## 2022-12-06 DIAGNOSIS — Z794 Long term (current) use of insulin: Secondary | ICD-10-CM | POA: Diagnosis not present

## 2022-12-06 DIAGNOSIS — I2510 Atherosclerotic heart disease of native coronary artery without angina pectoris: Secondary | ICD-10-CM | POA: Diagnosis not present

## 2022-12-06 DIAGNOSIS — E785 Hyperlipidemia, unspecified: Secondary | ICD-10-CM | POA: Diagnosis not present

## 2022-12-06 DIAGNOSIS — E1165 Type 2 diabetes mellitus with hyperglycemia: Secondary | ICD-10-CM | POA: Diagnosis not present

## 2022-12-06 DIAGNOSIS — I129 Hypertensive chronic kidney disease with stage 1 through stage 4 chronic kidney disease, or unspecified chronic kidney disease: Secondary | ICD-10-CM | POA: Diagnosis not present

## 2022-12-06 DIAGNOSIS — E1122 Type 2 diabetes mellitus with diabetic chronic kidney disease: Secondary | ICD-10-CM | POA: Diagnosis not present

## 2022-12-06 DIAGNOSIS — Z466 Encounter for fitting and adjustment of urinary device: Secondary | ICD-10-CM | POA: Diagnosis not present

## 2022-12-06 DIAGNOSIS — R4189 Other symptoms and signs involving cognitive functions and awareness: Secondary | ICD-10-CM | POA: Diagnosis not present

## 2022-12-06 DIAGNOSIS — E1142 Type 2 diabetes mellitus with diabetic polyneuropathy: Secondary | ICD-10-CM | POA: Diagnosis not present

## 2022-12-07 ENCOUNTER — Ambulatory Visit (INDEPENDENT_AMBULATORY_CARE_PROVIDER_SITE_OTHER): Payer: Medicare Other | Admitting: Family Medicine

## 2022-12-07 ENCOUNTER — Encounter: Payer: Self-pay | Admitting: Family Medicine

## 2022-12-07 VITALS — BP 152/87 | HR 65 | Temp 97.7°F | Ht 74.0 in | Wt 162.0 lb

## 2022-12-07 DIAGNOSIS — N390 Urinary tract infection, site not specified: Secondary | ICD-10-CM

## 2022-12-07 DIAGNOSIS — Z9359 Other cystostomy status: Secondary | ICD-10-CM

## 2022-12-07 NOTE — Progress Notes (Signed)
   Subjective:    Patient ID: Luke Solis, male    DOB: [birth year], 89 y.o.   MRN: [id number]  HPI  RSV follow up apparently his problems morbid UTI with lower abdominal pain where the catheter was discomfort with urination he relates minimal cough no high fever or chills.  No body aches.  PMH benign indwelling Foley catheter sees Dr. Rosana Hoes urology  Review of Systems     Objective:   Physical Exam Lungs clear heart regular abdomen no reported tenderness currently       Assessment & Plan:   UTI finish out antibiotics as prescribed See urology I doubt he got RSV from his wife I recommend RSV vaccine

## 2022-12-09 DIAGNOSIS — C61 Malignant neoplasm of prostate: Secondary | ICD-10-CM | POA: Diagnosis not present

## 2022-12-09 DIAGNOSIS — N3040 Irradiation cystitis without hematuria: Secondary | ICD-10-CM | POA: Diagnosis not present

## 2022-12-09 DIAGNOSIS — R972 Elevated prostate specific antigen [PSA]: Secondary | ICD-10-CM | POA: Diagnosis not present

## 2022-12-09 DIAGNOSIS — R339 Retention of urine, unspecified: Secondary | ICD-10-CM | POA: Diagnosis not present

## 2022-12-09 DIAGNOSIS — N35014 Post-traumatic urethral stricture, male, unspecified: Secondary | ICD-10-CM | POA: Diagnosis not present

## 2022-12-15 DIAGNOSIS — L609 Nail disorder, unspecified: Secondary | ICD-10-CM | POA: Diagnosis not present

## 2022-12-15 DIAGNOSIS — Z466 Encounter for fitting and adjustment of urinary device: Secondary | ICD-10-CM | POA: Diagnosis not present

## 2022-12-15 DIAGNOSIS — E1122 Type 2 diabetes mellitus with diabetic chronic kidney disease: Secondary | ICD-10-CM | POA: Diagnosis not present

## 2022-12-15 DIAGNOSIS — Z435 Encounter for attention to cystostomy: Secondary | ICD-10-CM | POA: Diagnosis not present

## 2022-12-15 DIAGNOSIS — M79672 Pain in left foot: Secondary | ICD-10-CM | POA: Diagnosis not present

## 2022-12-15 DIAGNOSIS — I129 Hypertensive chronic kidney disease with stage 1 through stage 4 chronic kidney disease, or unspecified chronic kidney disease: Secondary | ICD-10-CM | POA: Diagnosis not present

## 2022-12-15 DIAGNOSIS — E1140 Type 2 diabetes mellitus with diabetic neuropathy, unspecified: Secondary | ICD-10-CM | POA: Diagnosis not present

## 2022-12-15 DIAGNOSIS — N35919 Unspecified urethral stricture, male, unspecified site: Secondary | ICD-10-CM | POA: Diagnosis not present

## 2022-12-15 DIAGNOSIS — R296 Repeated falls: Secondary | ICD-10-CM | POA: Diagnosis not present

## 2022-12-15 DIAGNOSIS — M79671 Pain in right foot: Secondary | ICD-10-CM | POA: Diagnosis not present

## 2022-12-20 DIAGNOSIS — N35919 Unspecified urethral stricture, male, unspecified site: Secondary | ICD-10-CM | POA: Diagnosis not present

## 2022-12-20 DIAGNOSIS — Z435 Encounter for attention to cystostomy: Secondary | ICD-10-CM | POA: Diagnosis not present

## 2022-12-20 DIAGNOSIS — E1122 Type 2 diabetes mellitus with diabetic chronic kidney disease: Secondary | ICD-10-CM | POA: Diagnosis not present

## 2022-12-20 DIAGNOSIS — Z466 Encounter for fitting and adjustment of urinary device: Secondary | ICD-10-CM | POA: Diagnosis not present

## 2022-12-20 DIAGNOSIS — R296 Repeated falls: Secondary | ICD-10-CM | POA: Diagnosis not present

## 2022-12-20 DIAGNOSIS — I129 Hypertensive chronic kidney disease with stage 1 through stage 4 chronic kidney disease, or unspecified chronic kidney disease: Secondary | ICD-10-CM | POA: Diagnosis not present

## 2022-12-21 ENCOUNTER — Ambulatory Visit (INDEPENDENT_AMBULATORY_CARE_PROVIDER_SITE_OTHER): Payer: Medicare Other | Admitting: Family Medicine

## 2022-12-21 VITALS — BP 149/78 | HR 63 | Temp 97.7°F | Ht 74.0 in | Wt 163.0 lb

## 2022-12-21 DIAGNOSIS — I2510 Atherosclerotic heart disease of native coronary artery without angina pectoris: Secondary | ICD-10-CM

## 2022-12-21 DIAGNOSIS — N4889 Other specified disorders of penis: Secondary | ICD-10-CM | POA: Diagnosis not present

## 2022-12-21 DIAGNOSIS — Z794 Long term (current) use of insulin: Secondary | ICD-10-CM

## 2022-12-21 DIAGNOSIS — C61 Malignant neoplasm of prostate: Secondary | ICD-10-CM | POA: Diagnosis not present

## 2022-12-21 DIAGNOSIS — Z9359 Other cystostomy status: Secondary | ICD-10-CM

## 2022-12-21 DIAGNOSIS — R300 Dysuria: Secondary | ICD-10-CM | POA: Diagnosis not present

## 2022-12-21 DIAGNOSIS — E1140 Type 2 diabetes mellitus with diabetic neuropathy, unspecified: Secondary | ICD-10-CM

## 2022-12-21 MED ORDER — PREGABALIN 25 MG PO CAPS: ORAL_CAPSULE | ORAL | 5 refills | Status: DC

## 2022-12-21 NOTE — Patient Instructions (Signed)
Hi Eddie  We will be working on setting you up with a specialist at [facility]  Please do your blood work we will let you know the results Lyrica-you may start taking 1 in the morning 1 in the evening to see if this helps with the penile pain Next week we might adjust this to taking 3/day Also lidocaine jelly can be applied to the end of the urethra every hour when necessary for penile pain-just apply a small amount.  We may also be doing a scan in the future depending on your blood work  We will do a follow-up in 4 weeks I will call you next week to get an update on how you are doing  Please take care-Dr. Sallee Lange

## 2022-12-21 NOTE — Progress Notes (Signed)
[place] of  Subjective:    Patient ID: Luke Solis, male    DOB: 1931/08/03, 89 y.o.   MRN: [id number]  HPI Patient arrives today with pain in his penis. Penile pain discomfort Has suprapubic cath Has scar tissue in the bladder therefore cannot be through the penis at a lot of obstruction issues Suprapubic cath was put in because he was having to wear a regular catheter removed for a long span of time he was under the care of Dr. Rosana Hoes urology but recently patient would like to get a second opinion He does have remote history of prostate cancer Also diabetes    Review of Systems     Objective:   Physical Exam  Lungs clear hearts regular abdomen is soft GU normal      Assessment & Plan:  Some urethral discomfort Need to rule out the possibility of advancement of other issues because he has constant intermittent pain in the penis that could be referred from the bladder region.  Has a history of prostate cancer.  We will do lab work first and then follow-up based on that Lidocaine may be applied at the end of the penis every 1-2 hours as needed for discomfort Lyrica can be bumped up to 25 mg twice daily We will get feedback from the patient next week how he is doing Also referral to [facility] urology for second opinion regarding his situation patient states he would like to change urology providers Follow-up here in 4 weeks Topical lidocaine was also called into the pharmacy for him to use on the end of the urethra Diabetes with neuropathy being managed has a follow-up in February  Also his insurance currently is not covering his current insulin but will cover Lantus

## 2022-12-22 ENCOUNTER — Telehealth: Payer: Self-pay | Admitting: Family Medicine

## 2022-12-22 ENCOUNTER — Other Ambulatory Visit: Payer: Self-pay

## 2022-12-22 LAB — CMP14+EGFR
ALT: 9 IU/L (ref 0–44)
AST: 14 IU/L (ref 0–40)
Albumin/Globulin Ratio: 1.5 (ref 1.2–2.2)
Albumin: 4.3 g/dL (ref 3.7–4.7)
Alkaline Phosphatase: 77 IU/L (ref 44–121)
BUN/Creatinine Ratio: 15 (ref 10–24)
BUN: 23 mg/dL (ref 8–27)
Bilirubin Total: 0.2 mg/dL (ref 0.0–1.2)
CO2: 23 mmol/L (ref 20–29)
Calcium: 9.4 mg/dL (ref 8.6–10.2)
Chloride: 105 mmol/L (ref 96–106)
Creatinine, Ser: 1.56 mg/dL — ABNORMAL HIGH (ref 0.76–1.27)
Globulin, Total: 2.8 g/dL (ref 1.5–4.5)
Glucose: 151 mg/dL — ABNORMAL HIGH (ref 70–99)
Potassium: 4.1 mmol/L (ref 3.5–5.2)
Sodium: 143 mmol/L (ref 134–144)
Total Protein: 7.1 g/dL (ref 6.0–8.5)
eGFR: 42 mL/min/{1.73_m2} — ABNORMAL LOW (ref >59)

## 2022-12-22 LAB — CBC WITH DIFFERENTIAL/PLATELET
Basophils Absolute: 0.1 10*3/uL (ref 0.0–0.2)
Basos: 1 %
EOS (ABSOLUTE): 0.4 10*3/uL (ref 0.0–0.4)
Eos: 5 %
Hematocrit: 39.7 % (ref 37.5–51.0)
Hemoglobin: 13.5 g/dL (ref 13.0–17.7)
Immature Grans (Abs): 0 10*3/uL (ref 0.0–0.1)
Immature Granulocytes: 0 %
Lymphocytes Absolute: 1.9 10*3/uL (ref 0.7–3.1)
Lymphs: 21 %
MCH: 30.3 pg (ref 26.6–33.0)
MCHC: 34 g/dL (ref 31.5–35.7)
MCV: 89 fL (ref 79–97)
Monocytes Absolute: 0.6 10*3/uL (ref 0.1–0.9)
Monocytes: 7 %
Neutrophils Absolute: 5.9 10*3/uL (ref 1.4–7.0)
Neutrophils: 66 %
Platelets: 230 10*3/uL (ref 150–450)
RBC: 4.46 x10E6/uL (ref 4.14–5.80)
RDW: 11.9 % (ref 11.6–15.4)
WBC: 8.8 10*3/uL (ref 3.4–10.8)

## 2022-12-22 LAB — PSA: Prostate Specific Ag, Serum: 1.7 ng/mL (ref 0.0–4.0)

## 2022-12-22 MED ORDER — INSULIN GLARGINE 100 UNIT/ML ~~LOC~~ SOLN: SUBCUTANEOUS | 5 refills | Status: DC

## 2022-12-22 NOTE — Telephone Encounter (Signed)
Contacted pt to verify Lantus dosage. Received fax from insurance stating that a temporary supply was sent due to Insulin Glar not bein gon formulary. Lantus is covered by insurance per fax. Pt reports he is taking 14 units at bedtime. Rx updated and sent to pharmacy.

## 2022-12-23 ENCOUNTER — Other Ambulatory Visit: Payer: Self-pay | Admitting: Family Medicine

## 2022-12-24 ENCOUNTER — Other Ambulatory Visit: Payer: Self-pay | Admitting: Family Medicine

## 2023-01-03 DIAGNOSIS — H353211 Exudative age-related macular degeneration, right eye, with active choroidal neovascularization: Secondary | ICD-10-CM | POA: Diagnosis not present

## 2023-01-04 DIAGNOSIS — R296 Repeated falls: Secondary | ICD-10-CM | POA: Diagnosis not present

## 2023-01-04 DIAGNOSIS — I129 Hypertensive chronic kidney disease with stage 1 through stage 4 chronic kidney disease, or unspecified chronic kidney disease: Secondary | ICD-10-CM | POA: Diagnosis not present

## 2023-01-04 DIAGNOSIS — N35919 Unspecified urethral stricture, male, unspecified site: Secondary | ICD-10-CM | POA: Diagnosis not present

## 2023-01-04 DIAGNOSIS — E1122 Type 2 diabetes mellitus with diabetic chronic kidney disease: Secondary | ICD-10-CM | POA: Diagnosis not present

## 2023-01-04 DIAGNOSIS — Z466 Encounter for fitting and adjustment of urinary device: Secondary | ICD-10-CM | POA: Diagnosis not present

## 2023-01-04 DIAGNOSIS — Z435 Encounter for attention to cystostomy: Secondary | ICD-10-CM | POA: Diagnosis not present

## 2023-01-05 DIAGNOSIS — E1122 Type 2 diabetes mellitus with diabetic chronic kidney disease: Secondary | ICD-10-CM | POA: Diagnosis not present

## 2023-01-05 DIAGNOSIS — G4733 Obstructive sleep apnea (adult) (pediatric): Secondary | ICD-10-CM | POA: Diagnosis not present

## 2023-01-05 DIAGNOSIS — E1165 Type 2 diabetes mellitus with hyperglycemia: Secondary | ICD-10-CM | POA: Diagnosis not present

## 2023-01-05 DIAGNOSIS — R4189 Other symptoms and signs involving cognitive functions and awareness: Secondary | ICD-10-CM | POA: Diagnosis not present

## 2023-01-05 DIAGNOSIS — I951 Orthostatic hypotension: Secondary | ICD-10-CM | POA: Diagnosis not present

## 2023-01-05 DIAGNOSIS — E113211 Type 2 diabetes mellitus with mild nonproliferative diabetic retinopathy with macular edema, right eye: Secondary | ICD-10-CM | POA: Diagnosis not present

## 2023-01-05 DIAGNOSIS — I129 Hypertensive chronic kidney disease with stage 1 through stage 4 chronic kidney disease, or unspecified chronic kidney disease: Secondary | ICD-10-CM | POA: Diagnosis not present

## 2023-01-05 DIAGNOSIS — Z8546 Personal history of malignant neoplasm of prostate: Secondary | ICD-10-CM | POA: Diagnosis not present

## 2023-01-05 DIAGNOSIS — Z435 Encounter for attention to cystostomy: Secondary | ICD-10-CM | POA: Diagnosis not present

## 2023-01-05 DIAGNOSIS — Z794 Long term (current) use of insulin: Secondary | ICD-10-CM | POA: Diagnosis not present

## 2023-01-05 DIAGNOSIS — I2510 Atherosclerotic heart disease of native coronary artery without angina pectoris: Secondary | ICD-10-CM | POA: Diagnosis not present

## 2023-01-05 DIAGNOSIS — Z466 Encounter for fitting and adjustment of urinary device: Secondary | ICD-10-CM | POA: Diagnosis not present

## 2023-01-05 DIAGNOSIS — Z8673 Personal history of transient ischemic attack (TIA), and cerebral infarction without residual deficits: Secondary | ICD-10-CM | POA: Diagnosis not present

## 2023-01-05 DIAGNOSIS — Z87440 Personal history of urinary (tract) infections: Secondary | ICD-10-CM | POA: Diagnosis not present

## 2023-01-05 DIAGNOSIS — Z8616 Personal history of COVID-19: Secondary | ICD-10-CM | POA: Diagnosis not present

## 2023-01-05 DIAGNOSIS — N1832 Chronic kidney disease, stage 3b: Secondary | ICD-10-CM | POA: Diagnosis not present

## 2023-01-05 DIAGNOSIS — E1142 Type 2 diabetes mellitus with diabetic polyneuropathy: Secondary | ICD-10-CM | POA: Diagnosis not present

## 2023-01-05 DIAGNOSIS — R296 Repeated falls: Secondary | ICD-10-CM | POA: Diagnosis not present

## 2023-01-05 DIAGNOSIS — E785 Hyperlipidemia, unspecified: Secondary | ICD-10-CM | POA: Diagnosis not present

## 2023-01-05 DIAGNOSIS — N35919 Unspecified urethral stricture, male, unspecified site: Secondary | ICD-10-CM | POA: Diagnosis not present

## 2023-01-05 DIAGNOSIS — Z9181 History of falling: Secondary | ICD-10-CM | POA: Diagnosis not present

## 2023-01-12 ENCOUNTER — Ambulatory Visit: Payer: Medicare Other | Admitting: Family Medicine

## 2023-01-18 ENCOUNTER — Encounter: Payer: Self-pay | Admitting: Family Medicine

## 2023-01-18 ENCOUNTER — Ambulatory Visit (INDEPENDENT_AMBULATORY_CARE_PROVIDER_SITE_OTHER): Payer: Medicare Other | Admitting: Family Medicine

## 2023-01-18 VITALS — BP 135/74 | Wt 162.8 lb

## 2023-01-18 DIAGNOSIS — Z9359 Other cystostomy status: Secondary | ICD-10-CM | POA: Diagnosis not present

## 2023-01-18 DIAGNOSIS — N183 Chronic kidney disease, stage 3 unspecified: Secondary | ICD-10-CM | POA: Diagnosis not present

## 2023-01-18 DIAGNOSIS — Z794 Long term (current) use of insulin: Secondary | ICD-10-CM

## 2023-01-18 DIAGNOSIS — N4889 Other specified disorders of penis: Secondary | ICD-10-CM

## 2023-01-18 DIAGNOSIS — I2510 Atherosclerotic heart disease of native coronary artery without angina pectoris: Secondary | ICD-10-CM

## 2023-01-18 DIAGNOSIS — E1140 Type 2 diabetes mellitus with diabetic neuropathy, unspecified: Secondary | ICD-10-CM

## 2023-01-18 NOTE — Progress Notes (Signed)
   Subjective:    Patient ID: Luke Solis, male    DOB: 1931-06-15, 89 y.o.   MRN: [id number]  HPI  Patient arrives  for 4 week follow up.  Patient states only occasional penile pain recently over the past week and a half it has been doing pretty good Is doing the Lyrica twice a day  The patient was seen today as part of a comprehensive diabetic check up. Patient has diabetes Patient relates good compliance with taking the medication. We discussed their diet and exercise activities  We also discussed the importance of notifying us if any excessively high glucoses or low sugars.   Patient sometimes does not use his short acting insulin close to mealtimes.  We discussed the importance of this and thus also discussed the importance of sending [place] readings on a regular basis Wife reports pt has been having pain up until the last week and a half-has been much better. Pt has suprapubic cath.   Wife has question regarding Lyrica and how often to take it.  We discussed how twice a day would be reasonable for right now if necessary go up to 3 times per day   Review of Systems     Objective:   Physical Exam General-in no acute distress Eyes-no discharge Lungs-respiratory rate normal, CTA CV-no murmurs,RRR Extremities skin warm dry no edema Neuro grossly normal Behavior normal, alert        Assessment & Plan:   1. Type 2 diabetes mellitus with diabetic neuropathy, with long-term current use of insulin ([place]) The patient was seen today as part of a comprehensive visit for diabetes. The importance of keeping her A1c at or below 7 range was discussed.  Discussed diet, activity, and medication compliance Emphasized healthy eating primarily with vegetables fruits and if utilizing meats lean meats such as chicken or fish grilled baked broiled Avoid sugary drinks Minimize and avoid processed foods Fit in regular physical activity preferably 25 to 30 minutes 4 times per week Standard  follow-up visit recommended.  Patient aware lack of control and follow-up increases risk of diabetic complications. Regular follow-up visits Yearly ophthalmology Yearly foot exam  Recent A1c looked reasonable.  He will do a better job of using utilizing the short acting insulin they will keep [place] updated on the readings to do lab work before the next follow-up visit try to do lab work in March or April - Hemoglobin A1c - Lipid panel - Hepatic function panel - Basic Metabolic Panel (7) - Microalbumin/Creatinine Ratio, Urine  2. Stage 3 chronic kidney disease, unspecified whether stage 3a or 3b CKD ([place]) We will relook at kidney function before next follow-up visit.  Possibility of starting a low-dose ARB - Lipid panel - Hepatic function panel - Basic Metabolic Panel (7)  3. Penile pain Continue Lyrica twice per week sees urology specialists in April at [facility] await the results - Microalbumin/Creatinine Ratio, Urine  4. Suprapubic catheter ([place]) No sign of infection currently - Microalbumin/Creatinine Ratio, Urine

## 2023-01-20 ENCOUNTER — Other Ambulatory Visit: Payer: Self-pay | Admitting: Family Medicine

## 2023-01-24 ENCOUNTER — Other Ambulatory Visit: Payer: Self-pay

## 2023-01-24 ENCOUNTER — Other Ambulatory Visit: Payer: Self-pay | Admitting: *Deleted

## 2023-01-24 MED ORDER — INSULIN GLARGINE SOLOSTAR 100 UNIT/ML ~~LOC~~ SOPN: PEN_INJECTOR | SUBCUTANEOUS | 5 refills | Status: DC

## 2023-01-24 MED ORDER — POTASSIUM CHLORIDE ER 10 MEQ PO TBCR: 10.0000 meq | EXTENDED_RELEASE_TABLET | Freq: Every day | ORAL | 5 refills | Status: DC

## 2023-01-24 MED ORDER — ROSUVASTATIN CALCIUM 5 MG PO TABS: 5.0000 mg | ORAL_TABLET | Freq: Every day | ORAL | 5 refills | Status: DC

## 2023-02-02 DIAGNOSIS — E1122 Type 2 diabetes mellitus with diabetic chronic kidney disease: Secondary | ICD-10-CM | POA: Diagnosis not present

## 2023-02-02 DIAGNOSIS — Z435 Encounter for attention to cystostomy: Secondary | ICD-10-CM | POA: Diagnosis not present

## 2023-02-02 DIAGNOSIS — I129 Hypertensive chronic kidney disease with stage 1 through stage 4 chronic kidney disease, or unspecified chronic kidney disease: Secondary | ICD-10-CM | POA: Diagnosis not present

## 2023-02-02 DIAGNOSIS — Z466 Encounter for fitting and adjustment of urinary device: Secondary | ICD-10-CM | POA: Diagnosis not present

## 2023-02-02 DIAGNOSIS — R296 Repeated falls: Secondary | ICD-10-CM | POA: Diagnosis not present

## 2023-02-02 DIAGNOSIS — N35919 Unspecified urethral stricture, male, unspecified site: Secondary | ICD-10-CM | POA: Diagnosis not present

## 2023-02-04 DIAGNOSIS — Z435 Encounter for attention to cystostomy: Secondary | ICD-10-CM | POA: Diagnosis not present

## 2023-02-04 DIAGNOSIS — I129 Hypertensive chronic kidney disease with stage 1 through stage 4 chronic kidney disease, or unspecified chronic kidney disease: Secondary | ICD-10-CM | POA: Diagnosis not present

## 2023-02-04 DIAGNOSIS — E785 Hyperlipidemia, unspecified: Secondary | ICD-10-CM | POA: Diagnosis not present

## 2023-02-04 DIAGNOSIS — E1142 Type 2 diabetes mellitus with diabetic polyneuropathy: Secondary | ICD-10-CM | POA: Diagnosis not present

## 2023-02-04 DIAGNOSIS — N1832 Chronic kidney disease, stage 3b: Secondary | ICD-10-CM | POA: Diagnosis not present

## 2023-02-04 DIAGNOSIS — Z9181 History of falling: Secondary | ICD-10-CM | POA: Diagnosis not present

## 2023-02-04 DIAGNOSIS — Z794 Long term (current) use of insulin: Secondary | ICD-10-CM | POA: Diagnosis not present

## 2023-02-04 DIAGNOSIS — N35919 Unspecified urethral stricture, male, unspecified site: Secondary | ICD-10-CM | POA: Diagnosis not present

## 2023-02-04 DIAGNOSIS — E113211 Type 2 diabetes mellitus with mild nonproliferative diabetic retinopathy with macular edema, right eye: Secondary | ICD-10-CM | POA: Diagnosis not present

## 2023-02-04 DIAGNOSIS — E1165 Type 2 diabetes mellitus with hyperglycemia: Secondary | ICD-10-CM | POA: Diagnosis not present

## 2023-02-04 DIAGNOSIS — R4189 Other symptoms and signs involving cognitive functions and awareness: Secondary | ICD-10-CM | POA: Diagnosis not present

## 2023-02-04 DIAGNOSIS — I951 Orthostatic hypotension: Secondary | ICD-10-CM | POA: Diagnosis not present

## 2023-02-04 DIAGNOSIS — G4733 Obstructive sleep apnea (adult) (pediatric): Secondary | ICD-10-CM | POA: Diagnosis not present

## 2023-02-04 DIAGNOSIS — Z8616 Personal history of COVID-19: Secondary | ICD-10-CM | POA: Diagnosis not present

## 2023-02-04 DIAGNOSIS — R296 Repeated falls: Secondary | ICD-10-CM | POA: Diagnosis not present

## 2023-02-04 DIAGNOSIS — Z87440 Personal history of urinary (tract) infections: Secondary | ICD-10-CM | POA: Diagnosis not present

## 2023-02-04 DIAGNOSIS — E1122 Type 2 diabetes mellitus with diabetic chronic kidney disease: Secondary | ICD-10-CM | POA: Diagnosis not present

## 2023-02-04 DIAGNOSIS — Z466 Encounter for fitting and adjustment of urinary device: Secondary | ICD-10-CM | POA: Diagnosis not present

## 2023-02-04 DIAGNOSIS — Z8673 Personal history of transient ischemic attack (TIA), and cerebral infarction without residual deficits: Secondary | ICD-10-CM | POA: Diagnosis not present

## 2023-02-04 DIAGNOSIS — S81801D Unspecified open wound, right lower leg, subsequent encounter: Secondary | ICD-10-CM | POA: Diagnosis not present

## 2023-02-04 DIAGNOSIS — I2510 Atherosclerotic heart disease of native coronary artery without angina pectoris: Secondary | ICD-10-CM | POA: Diagnosis not present

## 2023-02-04 DIAGNOSIS — Z8546 Personal history of malignant neoplasm of prostate: Secondary | ICD-10-CM | POA: Diagnosis not present

## 2023-02-16 ENCOUNTER — Ambulatory Visit: Payer: Medicare Other | Admitting: Family Medicine

## 2023-02-18 DIAGNOSIS — S81801D Unspecified open wound, right lower leg, subsequent encounter: Secondary | ICD-10-CM | POA: Diagnosis not present

## 2023-02-18 DIAGNOSIS — Z435 Encounter for attention to cystostomy: Secondary | ICD-10-CM | POA: Diagnosis not present

## 2023-02-18 DIAGNOSIS — I129 Hypertensive chronic kidney disease with stage 1 through stage 4 chronic kidney disease, or unspecified chronic kidney disease: Secondary | ICD-10-CM | POA: Diagnosis not present

## 2023-02-18 DIAGNOSIS — R296 Repeated falls: Secondary | ICD-10-CM | POA: Diagnosis not present

## 2023-02-18 DIAGNOSIS — N35919 Unspecified urethral stricture, male, unspecified site: Secondary | ICD-10-CM | POA: Diagnosis not present

## 2023-02-18 DIAGNOSIS — Z466 Encounter for fitting and adjustment of urinary device: Secondary | ICD-10-CM | POA: Diagnosis not present

## 2023-02-21 ENCOUNTER — Other Ambulatory Visit: Payer: Self-pay | Admitting: Family Medicine

## 2023-02-23 DIAGNOSIS — Z794 Long term (current) use of insulin: Secondary | ICD-10-CM

## 2023-02-23 DIAGNOSIS — E1122 Type 2 diabetes mellitus with diabetic chronic kidney disease: Secondary | ICD-10-CM

## 2023-02-23 DIAGNOSIS — R296 Repeated falls: Secondary | ICD-10-CM

## 2023-02-23 DIAGNOSIS — E1165 Type 2 diabetes mellitus with hyperglycemia: Secondary | ICD-10-CM

## 2023-02-23 DIAGNOSIS — L609 Nail disorder, unspecified: Secondary | ICD-10-CM | POA: Diagnosis not present

## 2023-02-23 DIAGNOSIS — E785 Hyperlipidemia, unspecified: Secondary | ICD-10-CM

## 2023-02-23 DIAGNOSIS — Z8673 Personal history of transient ischemic attack (TIA), and cerebral infarction without residual deficits: Secondary | ICD-10-CM

## 2023-02-23 DIAGNOSIS — M79672 Pain in left foot: Secondary | ICD-10-CM | POA: Diagnosis not present

## 2023-02-23 DIAGNOSIS — E1142 Type 2 diabetes mellitus with diabetic polyneuropathy: Secondary | ICD-10-CM

## 2023-02-23 DIAGNOSIS — M79671 Pain in right foot: Secondary | ICD-10-CM | POA: Diagnosis not present

## 2023-02-23 DIAGNOSIS — I129 Hypertensive chronic kidney disease with stage 1 through stage 4 chronic kidney disease, or unspecified chronic kidney disease: Secondary | ICD-10-CM

## 2023-02-23 DIAGNOSIS — N35919 Unspecified urethral stricture, male, unspecified site: Secondary | ICD-10-CM | POA: Diagnosis not present

## 2023-02-23 DIAGNOSIS — E1140 Type 2 diabetes mellitus with diabetic neuropathy, unspecified: Secondary | ICD-10-CM | POA: Diagnosis not present

## 2023-02-23 DIAGNOSIS — E113211 Type 2 diabetes mellitus with mild nonproliferative diabetic retinopathy with macular edema, right eye: Secondary | ICD-10-CM

## 2023-02-23 DIAGNOSIS — I2510 Atherosclerotic heart disease of native coronary artery without angina pectoris: Secondary | ICD-10-CM

## 2023-02-23 DIAGNOSIS — I951 Orthostatic hypotension: Secondary | ICD-10-CM

## 2023-02-23 DIAGNOSIS — R4189 Other symptoms and signs involving cognitive functions and awareness: Secondary | ICD-10-CM

## 2023-02-23 DIAGNOSIS — Z87440 Personal history of urinary (tract) infections: Secondary | ICD-10-CM

## 2023-02-23 DIAGNOSIS — Z8546 Personal history of malignant neoplasm of prostate: Secondary | ICD-10-CM

## 2023-02-23 DIAGNOSIS — Z435 Encounter for attention to cystostomy: Secondary | ICD-10-CM | POA: Diagnosis not present

## 2023-02-23 DIAGNOSIS — S81801D Unspecified open wound, right lower leg, subsequent encounter: Secondary | ICD-10-CM | POA: Diagnosis not present

## 2023-02-23 DIAGNOSIS — Z8616 Personal history of COVID-19: Secondary | ICD-10-CM

## 2023-02-23 DIAGNOSIS — Z9181 History of falling: Secondary | ICD-10-CM

## 2023-02-23 DIAGNOSIS — G4733 Obstructive sleep apnea (adult) (pediatric): Secondary | ICD-10-CM

## 2023-02-23 DIAGNOSIS — Z466 Encounter for fitting and adjustment of urinary device: Secondary | ICD-10-CM | POA: Diagnosis not present

## 2023-02-23 DIAGNOSIS — N1832 Chronic kidney disease, stage 3b: Secondary | ICD-10-CM

## 2023-02-24 DIAGNOSIS — I129 Hypertensive chronic kidney disease with stage 1 through stage 4 chronic kidney disease, or unspecified chronic kidney disease: Secondary | ICD-10-CM | POA: Diagnosis not present

## 2023-02-24 DIAGNOSIS — N35919 Unspecified urethral stricture, male, unspecified site: Secondary | ICD-10-CM | POA: Diagnosis not present

## 2023-02-24 DIAGNOSIS — R296 Repeated falls: Secondary | ICD-10-CM | POA: Diagnosis not present

## 2023-02-24 DIAGNOSIS — Z435 Encounter for attention to cystostomy: Secondary | ICD-10-CM | POA: Diagnosis not present

## 2023-02-24 DIAGNOSIS — Z466 Encounter for fitting and adjustment of urinary device: Secondary | ICD-10-CM | POA: Diagnosis not present

## 2023-02-24 DIAGNOSIS — S81801D Unspecified open wound, right lower leg, subsequent encounter: Secondary | ICD-10-CM | POA: Diagnosis not present

## 2023-03-02 ENCOUNTER — Encounter: Payer: Self-pay | Admitting: Family Medicine

## 2023-03-02 ENCOUNTER — Ambulatory Visit (INDEPENDENT_AMBULATORY_CARE_PROVIDER_SITE_OTHER): Payer: Medicare Other | Admitting: Family Medicine

## 2023-03-02 VITALS — BP 156/74 | HR 62 | Temp 97.5°F | Ht 74.0 in | Wt 161.0 lb

## 2023-03-02 DIAGNOSIS — I10 Essential (primary) hypertension: Secondary | ICD-10-CM

## 2023-03-02 DIAGNOSIS — Z466 Encounter for fitting and adjustment of urinary device: Secondary | ICD-10-CM | POA: Diagnosis not present

## 2023-03-02 DIAGNOSIS — R296 Repeated falls: Secondary | ICD-10-CM | POA: Diagnosis not present

## 2023-03-02 DIAGNOSIS — I129 Hypertensive chronic kidney disease with stage 1 through stage 4 chronic kidney disease, or unspecified chronic kidney disease: Secondary | ICD-10-CM | POA: Diagnosis not present

## 2023-03-02 DIAGNOSIS — S81801D Unspecified open wound, right lower leg, subsequent encounter: Secondary | ICD-10-CM | POA: Diagnosis not present

## 2023-03-02 DIAGNOSIS — Z435 Encounter for attention to cystostomy: Secondary | ICD-10-CM | POA: Diagnosis not present

## 2023-03-02 DIAGNOSIS — N35919 Unspecified urethral stricture, male, unspecified site: Secondary | ICD-10-CM | POA: Diagnosis not present

## 2023-03-02 MED ORDER — AMLODIPINE BESYLATE 5 MG PO TABS: 5.0000 mg | ORAL_TABLET | Freq: Every day | ORAL | 5 refills | Status: DC

## 2023-03-02 NOTE — Patient Instructions (Signed)
Increase amlodipine 2.5  For now 2 daily at the same time We sent in amlodipine 5 mg one a day  As we discussed when you get the new dose he will stop taking 2 of the 2.5 mg and you will just take one of the 5 mg daily-should you have trouble let us know otherwise we will see you back in approximately 2 weeks - 3 weeks Please do your lab work a few days before your office visit  /Dr. Nicki Reaper

## 2023-03-02 NOTE — Progress Notes (Signed)
   Subjective:    Patient ID: Luke Solis, male    DOB: 1930/12/23, 89 y.o.   MRN: [id number]  HPI Blood pressure has been elevated for no good reason over the past few weeks denies any chest tightness pressure pain shortness of breath headache.  Has history of high blood pressure.  Also has history of diabetes.  And indwelling catheter denies fever chills dysuria   Review of Systems     Objective:   Physical Exam Lungs clear heart regular pulse normal BP is elevated       Assessment & Plan:  HD in Increased dose amlodipine was 2.5 Now 5 mg Monitor closely Follow-up as planned lab work before next follow-up visit

## 2023-03-06 DIAGNOSIS — I951 Orthostatic hypotension: Secondary | ICD-10-CM | POA: Diagnosis not present

## 2023-03-06 DIAGNOSIS — N35919 Unspecified urethral stricture, male, unspecified site: Secondary | ICD-10-CM | POA: Diagnosis not present

## 2023-03-06 DIAGNOSIS — R4189 Other symptoms and signs involving cognitive functions and awareness: Secondary | ICD-10-CM | POA: Diagnosis not present

## 2023-03-06 DIAGNOSIS — Z8616 Personal history of COVID-19: Secondary | ICD-10-CM | POA: Diagnosis not present

## 2023-03-06 DIAGNOSIS — E1142 Type 2 diabetes mellitus with diabetic polyneuropathy: Secondary | ICD-10-CM | POA: Diagnosis not present

## 2023-03-06 DIAGNOSIS — E1122 Type 2 diabetes mellitus with diabetic chronic kidney disease: Secondary | ICD-10-CM | POA: Diagnosis not present

## 2023-03-06 DIAGNOSIS — Z794 Long term (current) use of insulin: Secondary | ICD-10-CM | POA: Diagnosis not present

## 2023-03-06 DIAGNOSIS — G4733 Obstructive sleep apnea (adult) (pediatric): Secondary | ICD-10-CM | POA: Diagnosis not present

## 2023-03-06 DIAGNOSIS — Z435 Encounter for attention to cystostomy: Secondary | ICD-10-CM | POA: Diagnosis not present

## 2023-03-06 DIAGNOSIS — Z9181 History of falling: Secondary | ICD-10-CM | POA: Diagnosis not present

## 2023-03-06 DIAGNOSIS — Z8546 Personal history of malignant neoplasm of prostate: Secondary | ICD-10-CM | POA: Diagnosis not present

## 2023-03-06 DIAGNOSIS — Z8673 Personal history of transient ischemic attack (TIA), and cerebral infarction without residual deficits: Secondary | ICD-10-CM | POA: Diagnosis not present

## 2023-03-06 DIAGNOSIS — E1165 Type 2 diabetes mellitus with hyperglycemia: Secondary | ICD-10-CM | POA: Diagnosis not present

## 2023-03-06 DIAGNOSIS — Z87440 Personal history of urinary (tract) infections: Secondary | ICD-10-CM | POA: Diagnosis not present

## 2023-03-06 DIAGNOSIS — R296 Repeated falls: Secondary | ICD-10-CM | POA: Diagnosis not present

## 2023-03-06 DIAGNOSIS — I129 Hypertensive chronic kidney disease with stage 1 through stage 4 chronic kidney disease, or unspecified chronic kidney disease: Secondary | ICD-10-CM | POA: Diagnosis not present

## 2023-03-06 DIAGNOSIS — E113211 Type 2 diabetes mellitus with mild nonproliferative diabetic retinopathy with macular edema, right eye: Secondary | ICD-10-CM | POA: Diagnosis not present

## 2023-03-06 DIAGNOSIS — E785 Hyperlipidemia, unspecified: Secondary | ICD-10-CM | POA: Diagnosis not present

## 2023-03-06 DIAGNOSIS — Z466 Encounter for fitting and adjustment of urinary device: Secondary | ICD-10-CM | POA: Diagnosis not present

## 2023-03-06 DIAGNOSIS — I2510 Atherosclerotic heart disease of native coronary artery without angina pectoris: Secondary | ICD-10-CM | POA: Diagnosis not present

## 2023-03-06 DIAGNOSIS — N1832 Chronic kidney disease, stage 3b: Secondary | ICD-10-CM | POA: Diagnosis not present

## 2023-03-06 DIAGNOSIS — S81801D Unspecified open wound, right lower leg, subsequent encounter: Secondary | ICD-10-CM | POA: Diagnosis not present

## 2023-03-08 ENCOUNTER — Telehealth: Payer: Self-pay | Admitting: Family Medicine

## 2023-03-08 NOTE — Telephone Encounter (Signed)
Contacted [name] to schedule their annual wellness visit. Appointment made for 03/11/2023.  Thank you,  Colletta [place],  [place] [facility] Direct Dial ??[phone]

## 2023-03-11 ENCOUNTER — Other Ambulatory Visit: Payer: Self-pay | Admitting: Family Medicine

## 2023-03-11 ENCOUNTER — Ambulatory Visit (INDEPENDENT_AMBULATORY_CARE_PROVIDER_SITE_OTHER): Payer: Medicare Other

## 2023-03-11 VITALS — Ht 74.0 in | Wt 161.0 lb

## 2023-03-11 DIAGNOSIS — Z0000 Encounter for general adult medical examination without abnormal findings: Secondary | ICD-10-CM

## 2023-03-11 NOTE — Progress Notes (Signed)
Subjective:   Luke Solis is a 89 y.o. male who presents for Medicare Annual/Subsequent preventive examination.  I connected with  Leandro Reasoner on 03/11/23 by a audio enabled telemedicine application and verified that I am speaking with the correct person using two identifiers.  Patient Location: Home  Provider Location: Office/Clinic  I discussed the limitations of evaluation and management by telemedicine. The patient expressed understanding and agreed to proceed.  Review of Systems     Cardiac Risk Factors include: advanced age (>38men, >80 women);diabetes mellitus;dyslipidemia;male gender;hypertension;sedentary lifestyle     Objective:    Today's Vitals   03/11/23 1303  Weight: 161 lb (73 kg)  Height: 6\' 2"  (1.88 m)   Body mass index is 20.67 kg/m.     03/11/2023    1:21 PM 03/26/2022    8:55 PM 02/23/2022    3:14 PM 10/07/2021   11:39 AM 06/09/2021    6:00 AM 02/02/2021    7:52 AM 06/19/2020    2:33 PM  Advanced Directives  Does Patient Have a Medical Advance Directive? Yes No Yes Yes No Yes Yes  Type of Advance Directive Living will;Healthcare Power of [employer] Power of [place];Living will Healthcare Power of [place];Living will  Living will   Does patient want to make changes to medical advance directive? No - Patient declined   No - Patient declined     Copy of Healthcare Power of Attorney in Chart? No - copy requested  No - copy requested      Would patient like information on creating a medical advance directive?  No - Patient declined   No - Patient declined      Current Medications (verified) Outpatient Encounter Medications as of 03/11/2023  Medication Sig   amLODipine (NORVASC) 5 MG tablet Take 1 tablet (5 mg total) by mouth daily.   Continuous Blood Gluc Receiver (FREESTYLE LIBRE 14 DAY READER) DEVI On insulin shots 4 times a day Dx E11.9   Continuous Blood Gluc Sensor (FREESTYLE LIBRE 2 SENSOR) MISC ON INSULIN 4 TIMES DAILY TYPE 2 DM ON  INSULIN.   fexofenadine (ALLEGRA) 180 MG tablet Take 1 tablet (180 mg total) by mouth daily as needed for allergies.   FORACARE PREMIUM V10 TEST test strip USE AS DIRECTED.   GLOBAL EASE INJECT PEN NEEDLES 32G X 4 MM MISC USE TO INJECT LANTUS EVERY EVENING AND HUMALOG THREE (3) TIMES DAILY.   insulin glargine (LANTUS) 100 UNIT/ML injection Inject 14 Units into the skin at bedtime.   Insulin Glargine Solostar (LANTUS) 100 UNIT/ML Solostar Pen INJECT 20 UNITS SUB-Q AT BEDTIME.   insulin lispro (HUMALOG KWIKPEN) 100 UNIT/ML KwikPen Inject insulin per sliding scale  101-175 - 3 units 176-250- 4 units  251-300- 5 units  301-350- 6 units 351-450- 7 units > greater than 450 - please call   ketoconazole (NIZORAL) 2 % cream APPLY THIN AMOUNT TWICE DAILY TO INFLAMMED AREA OF GENITALS.   Multiple Vitamins-Minerals (PRESERVISION AREDS 2) CAPS Take 1 capsule by mouth 2 (two) times daily.   mupirocin ointment (BACTROBAN) 2 % Apply 1 Application topically 2 (two) times daily.   nitroGLYCERIN (NITROSTAT) 0.4 MG SL tablet Place 1 tablet (0.4 mg total) under the tongue every 5 (five) minutes as needed for chest pain.   ofloxacin (OCUFLOX) 0.3 % ophthalmic solution    potassium chloride (KLOR-CON) 10 MEQ tablet Take 1 tablet (10 mEq total) by mouth daily.   pregabalin (LYRICA) 25 MG capsule TAKE 1 CAPSULE 3  TIMES DIALY AS NEEDED FOR DIABETIC NEUROPATHY.   rosuvastatin (CRESTOR) 5 MG tablet Take 1 tablet (5 mg total) by mouth daily.   Vitamin D, Ergocalciferol, (DRISDOL) 1.25 MG (50000 UNIT) CAPS capsule Take 50,000 Units by mouth every 7 (seven) days.   No facility-administered encounter medications on file as of 03/11/2023.    Allergies (verified) Broccoli [place] oleracea] and Celery oil   History: Past Medical History:  Diagnosis Date   Cataract    Coronary atherosclerosis of native coronary artery    BMS proximal and mid RCA as well as BMS circumflex - 2002 (had residual 70% distal LAD), LVEF 60%    COVID    Diabetes mellitus without complication    Essential hypertension    Hyperlipidemia    Neuropathy    NSTEMI (non-ST elevated myocardial infarction)    2002   Prostate cancer    Sleep apnea    On CPAP   Stroke    Past Surgical History:  Procedure Laterality Date   COLONOSCOPY     EYE SURGERY     INGUINAL HERNIA REPAIR     KNEE SURGERY     Patient states he has never had knee surgery   NOSE SURGERY     PROSTATE SURGERY     TONSILLECTOMY     Family History  Problem Relation Age of Onset   Heart attack Mother    Diabetes Mother    Coronary artery disease Father    Hypertension Father    Heart attack Father    Stroke Maternal Grandfather    Lung disease Neg Hx    Cancer Neg Hx    Social History   Socioeconomic History   Marital status: Married    Spouse name: Technical sales engineer   Number of children: 4   Years of education: Not on file   Highest education level: Not on file  Occupational History   Not on file  Tobacco Use   Smoking status: Never   Smokeless tobacco: Never  Vaping Use   Vaping Use: Never used  Substance and Sexual Activity   Alcohol use: No    Alcohol/week: 0.0 standard drinks of alcohol   Drug use: No   Sexual activity: Not on file  Other Topics Concern   Not on file  Social History Narrative   [facility] (08/03/17):   Originally from [facility]. Has always lived in [place]. 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 No pets currently. No bird or mold exposure.    Married x 63 years, 2023.    3 daughters, 1 son.    Social Determinants of Health   Financial Resource Strain: Low Risk  (03/11/2023)   Overall Financial Resource Strain (CARDIA)    Difficulty of Paying Living Expenses: Not hard at all  Food Insecurity: No Food Insecurity (03/11/2023)   Hunger Vital Sign    Worried About Running Out of Food in the Last Year: Never true    Ran Out of Food in the Last Year: Never true  Transportation Needs: No Transportation Needs  (03/11/2023)   PRAPARE - Administrator, Civil Service (Medical): No    Lack of Transportation (Non-Medical): No  Physical Activity: Insufficiently Active (03/11/2023)   Exercise Vital Sign    Days of Exercise per Week: 2 days    Minutes of Exercise per Session: 30 min  Stress: No Stress Concern Present (03/11/2023)   [employer] of Occupational Health - Occupational Stress Questionnaire  Feeling of Stress : Not at all  Social Connections: Socially Integrated (03/11/2023)   Social Connection and Isolation Panel [NHANES]    Frequency of Communication with Friends and Family: More than three times a week    Frequency of Social Gatherings with Friends and Family: More than three times a week    Attends Religious Services: More than 4 times per year    Active Member of [employer] or Organizations: Yes    Attends Engineer, structuralClub or Organization Meetings: More than 4 times per year    Marital Status: Married    Tobacco Counseling Counseling given: Not Answered   Clinical Intake:  Pre-visit preparation completed: Yes  Pain : No/denies pain  Diabetes: Yes CBG done?: No Did pt. bring in CBG monitor from home?: No  How often do you need to have someone help you when you read instructions, pamphlets, or other written materials from your doctor or pharmacy?: 1 - Never  Diabetic?Yes   Nutrition Risk Assessment:  Has the patient had any N/V/D within the last 2 months?  No  Does the patient have any non-healing wounds?  No  Has the patient had any unintentional weight loss or weight gain?  No   Diabetes:  Is the patient diabetic?  Yes  If diabetic, was a CBG obtained today?  No  Did the patient bring in their glucometer from home?  No  How often do you monitor your CBG's? Freestyle Libre.   Financial Strains and Diabetes Management:  Are you having any financial strains with the device, your supplies or your medication? No .  Does the patient want to be seen by Chronic Care  Management for management of their diabetes?  No  Would the patient like to be referred to a Nutritionist or for Diabetic Management?  No   Diabetic Exams:  Diabetic Eye Exam: Completed records requested  Diabetic Foot Exam: Completed 08/24/22   Interpreter Needed?: No  Information entered by :: Kandis Fantasiaourtney Nateisha Moyd LPN   Activities of Daily Living    03/11/2023    1:21 PM  In your present state of health, do you have any difficulty performing the following activities:  Hearing? 1  Vision? 0  Difficulty concentrating or making decisions? 0  Walking or climbing stairs? 1  Dressing or bathing? 0  Doing errands, shopping? 1  Preparing Food and eating ? N  Using the Toilet? N  In the past six months, have you accidently leaked urine? N  Do you have problems with loss of bowel control? N  Managing your Medications? Y  Managing your Finances? N  Housekeeping or managing your Housekeeping? Y    Patient Care Team: Babs SciaraLuking, Scott A, MD as PCP - General (Family Medicine) Jonelle SidleMcDowell, Samuel G, MD as PCP - Cardiology (Cardiology) [facility] Esaw Daceavis, Ronald L III, MD as Attending Physician (Urology)  Indicate any recent Medical Services you may have received from other than [facility] providers in the past year (date may be approximate).     Assessment:   This is a routine wellness examination for Bray.  Hearing/Vision screen Hearing Screening - Comments:: Bilateral hearing aids   Vision Screening - Comments:: Wears rx glasses - up to date with routine eye exams with Dr. Dione BoozeGroat     Dietary issues and exercise activities discussed: Current Exercise Habits: The patient does not participate in regular exercise at present   Goals Addressed             This Visit's Progress  Prevent falls         Depression Screen    03/11/2023    1:22 PM 01/18/2023    2:38 PM 12/21/2022   11:33 AM 10/18/2022    1:35 PM 02/23/2022    3:10 PM 10/07/2021   11:48 AM 03/26/2021     1:20 PM  PHQ 2/9 Scores  PHQ - 2 Score 0 0 0 2 0 0 0  PHQ- 9 Score 0 2 3 5        Fall Risk    03/11/2023    1:21 PM 01/18/2023    2:38 PM 12/21/2022   11:33 AM 10/18/2022    1:35 PM 05/05/2022   11:42 AM  Fall Risk   Falls in the past year? 1 1 1 1 1   Number falls in past yr: 0 0 0 0   Injury with Fall? 0 0 0 0   Risk for fall due to : History of fall(s);Impaired balance/gait;Impaired mobility Impaired balance/gait;Impaired mobility     Follow up Falls prevention discussed;Education provided;Falls evaluation completed Falls evaluation completed;Education provided  Falls evaluation completed     FALL RISK PREVENTION PERTAINING TO THE HOME:  Any stairs in or around the home? No  If so, are there any without handrails? No  Home free of loose throw rugs in walkways, pet beds, electrical cords, etc? Yes  Adequate lighting in your home to reduce risk of falls? Yes   ASSISTIVE DEVICES UTILIZED TO PREVENT FALLS:  Life alert? No  Use of a cane, walker or w/c? Yes  Grab bars in the bathroom? Yes  Shower chair or bench in shower? No  Elevated toilet seat or a handicapped toilet? Yes   TIMED UP AND GO:  Was the test performed? No . Telephonic visit   Cognitive Function:      03/23/2018    9:59 PM  Montreal Cognitive Assessment   Visuospatial/ Executive (0/5) 4  Naming (0/3) 2  Attention: Read list of digits (0/2) 2  Attention: Read list of letters (0/1) 1  Attention: Serial 7 subtraction starting at 100 (0/3) 2  Language: Repeat phrase (0/2) 2  Language : Fluency (0/1) 0  Abstraction (0/2) 2  Delayed Recall (0/5) 3  Orientation (0/6) 4  Total 22  Adjusted Score (based on education) 22      03/11/2023    1:22 PM 02/23/2022    3:18 PM  6CIT Screen  What Year? 0 points 0 points  What month? 0 points 0 points  What time? 0 points 0 points  Count back from 20 0 points 0 points  Months in reverse 0 points 0 points  Repeat phrase 2 points 2 points  Total Score 2 points 2  points    Immunizations Immunization History  Administered Date(s) Administered   Fluad Quad(high Dose 65+) 10/20/2020, 08/26/2021, 08/24/2022   H1N1 11/05/2008   Influenza, High Dose Seasonal PF 09/15/2017   Influenza,inj,Quad PF,6+ Mos 08/28/2014, 10/22/2015, 08/16/2016, 09/19/2018, 10/16/2019   Influenza-Unspecified 09/05/2012, 10/22/2013   Moderna Sars-Covid-2 Vaccination 12/17/2019, 01/14/2020, 10/13/2020   Pneumococcal Conjugate-13 08/28/2014   Pneumococcal Polysaccharide-23 12/07/2011   Td 04/22/2010   Zoster Recombinat (Shingrix) 08/16/2019    TDAP status: Due, Education has been provided regarding the importance of this vaccine. Advised may receive this vaccine at local pharmacy or Health Dept. Aware to provide a copy of the vaccination record if obtained from local pharmacy or Health Dept. Verbalized acceptance and understanding.  Flu Vaccine status: Up to date  Pneumococcal vaccine  status: Up to date  Covid-19 vaccine status: Information provided on how to obtain vaccines.   Qualifies for Shingles Vaccine? Yes   Zostavax completed No   Shingrix Completed?: No.    Education has been provided regarding the importance of this vaccine. Patient has been advised to call insurance company to determine out of pocket expense if they have not yet received this vaccine. Advised may also receive vaccine at local pharmacy or Health Dept. Verbalized acceptance and understanding.  Screening Tests Health Maintenance  Topic Date Due   Zoster Vaccines- Shingrix (2 of 2) 10/11/2019   DTaP/Tdap/Td (2 - Tdap) 04/22/2020   COVID-19 Vaccine (4 - 2023-24 season) 08/06/2022   OPHTHALMOLOGY EXAM  01/19/2023   Medicare Annual Wellness (AWV)  02/24/2023   HEMOGLOBIN A1C  03/29/2023   INFLUENZA VACCINE  07/07/2023   FOOT EXAM  10/19/2023   Pneumonia Vaccine 59+ Years old  Completed   HPV VACCINES  Aged Out    Health Maintenance  Health Maintenance Due  Topic Date Due   Zoster Vaccines-  Shingrix (2 of 2) 10/11/2019   DTaP/Tdap/Td (2 - Tdap) 04/22/2020   COVID-19 Vaccine (4 - 2023-24 season) 08/06/2022   OPHTHALMOLOGY EXAM  01/19/2023   Medicare Annual Wellness (AWV)  02/24/2023    Colorectal cancer screening: No longer required.   Lung Cancer Screening: (Low Dose CT Chest recommended if Age 71-80 years, 30 pack-year currently smoking OR have quit w/in 15years.) does not qualify.   Lung Cancer Screening Referral: n/a  Additional Screening:  Hepatitis C Screening: does not qualify  Vision Screening: Recommended annual ophthalmology exams for early detection of glaucoma and other disorders of the eye. Is the patient up to date with their annual eye exam?  Yes  Who is the provider or what is the name of the office in which the patient attends annual eye exams? Dr. Dione Booze  If pt is not established with a provider, would they like to be referred to a provider to establish care? No .   Dental Screening: Recommended annual dental exams for proper oral hygiene  Community Resource Referral / Chronic Care Management: CRR required this visit?  No   CCM required this visit?   Currently receiving services      Plan:     I have personally reviewed and noted the following in the patient's chart:   Medical and social history Use of alcohol, tobacco or illicit drugs  Current medications and supplements including opioid prescriptions. Patient is not currently taking opioid prescriptions. Functional ability and status Nutritional status Physical activity Advanced directives List of other physicians Hospitalizations, surgeries, and ER visits in previous 12 months Vitals Screenings to include cognitive, depression, and falls Referrals and appointments  In addition, I have reviewed and discussed with patient certain preventive protocols, quality metrics, and best practice recommendations. A written personalized care plan for preventive services as well as general preventive  health recommendations were provided to patient.     Durwin Nora, [place]   01/10/3663   Due to this being a virtual visit, the after visit summary with patients personalized plan was offered to patient via mail or my-chart. per request, patient was mailed a copy of AVS.  Nurse Notes: No concerns

## 2023-03-11 NOTE — Patient Instructions (Signed)
Luke Solis , Thank you for taking time to come for your Medicare Wellness Visit. I appreciate your ongoing commitment to your health goals. Please review the following plan we discussed and let me know if I can assist you in the future.   These are the goals we discussed:  Goals      Exercise 3x per week (30 min per time)     Try to increase exercise as tolerated.         This is a list of the screening recommended for you and due dates:  Health Maintenance  Topic Date Due   Zoster (Shingles) Vaccine (2 of 2) 10/11/2019   DTaP/Tdap/Td vaccine (2 - Tdap) 04/22/2020   COVID-19 Vaccine (4 - 2023-24 season) 08/06/2022   Eye exam for diabetics  01/19/2023   Medicare Annual Wellness Visit  02/24/2023   Hemoglobin A1C  03/29/2023   Flu Shot  07/07/2023   Complete foot exam   10/19/2023   Pneumonia Vaccine  Completed   HPV Vaccine  Aged Out    Advanced directives: Please bring a copy of your health care power of attorney and living will to the office to be added to your chart at your convenience.   Conditions/risks identified: Aim for 30 minutes of exercise or brisk walking, 6-8 glasses of water, and 5 servings of fruits and vegetables each day.   Next appointment: Follow up in one year for your annual wellness visit.   Preventive Care 31 Years and Older, Male  Preventive care refers to lifestyle choices and visits with your health care provider that can promote health and wellness. What does preventive care include? A yearly physical exam. This is also called an annual well check. Dental exams once or twice a year. Routine eye exams. Ask your health care provider how often you should have your eyes checked. Personal lifestyle choices, including: Daily care of your teeth and gums. Regular physical activity. Eating a healthy diet. Avoiding tobacco and drug use. Limiting alcohol use. Practicing safe sex. Taking low doses of aspirin every day. Taking vitamin and mineral  supplements as recommended by your health care provider. What happens during an annual well check? The services and screenings done by your health care provider during your annual well check will depend on your age, overall health, lifestyle risk factors, and family history of disease. Counseling  Your health care provider may ask you questions about your: Alcohol use. Tobacco use. Drug use. Emotional well-being. Home and relationship well-being. Sexual activity. Eating habits. History of falls. Memory and ability to understand (cognition). Work and work Astronomer. Screening  You may have the following tests or measurements: Height, weight, and BMI. Blood pressure. Lipid and cholesterol levels. These may be checked every 5 years, or more frequently if you are over 89 years old. Skin check. Lung cancer screening. You may have this screening every year starting at age 20 if you have a 30-pack-year history of smoking and currently smoke or have quit within the past 15 years. Fecal occult blood test (FOBT) of the stool. You may have this test every year starting at age 79. Flexible sigmoidoscopy or colonoscopy. You may have a sigmoidoscopy every 5 years or a colonoscopy every 10 years starting at age 15. Prostate cancer screening. Recommendations will vary depending on your family history and other risks. Hepatitis C blood test. Hepatitis B blood test. Sexually transmitted disease (STD) testing. Diabetes screening. This is done by checking your blood sugar (glucose) after you have  not eaten for a while (fasting). You may have this done every 1-3 years. Abdominal aortic aneurysm (AAA) screening. You may need this if you are a current or former smoker. Osteoporosis. You may be screened starting at age 32 if you are at high risk. Talk with your health care provider about your test results, treatment options, and if necessary, the need for more tests. Vaccines  Your health care provider  may recommend certain vaccines, such as: Influenza vaccine. This is recommended every year. Tetanus, diphtheria, and acellular pertussis (Tdap, Td) vaccine. You may need a Td booster every 10 years. Zoster vaccine. You may need this after age 67. Pneumococcal 13-valent conjugate (PCV13) vaccine. One dose is recommended after age 45. Pneumococcal polysaccharide (PPSV23) vaccine. One dose is recommended after age 1. Talk to your health care provider about which screenings and vaccines you need and how often you need them. This information is not intended to replace advice given to you by your health care provider. Make sure you discuss any questions you have with your health care provider. Document Released: 12/19/2015 Document Revised: 08/11/2016 Document Reviewed: 09/23/2015 Elsevier Interactive Patient Education  2017 [employer] Prevention in the Home Falls can cause injuries. They can happen to people of all ages. There are many things you can do to make your home safe and to help prevent falls. What can I do on the outside of my home? Regularly fix the edges of walkways and driveways and fix any cracks. Remove anything that might make you trip as you walk through a door, such as a raised step or threshold. Trim any bushes or trees on the path to your home. Use bright outdoor lighting. Clear any walking paths of anything that might make someone trip, such as rocks or tools. Regularly check to see if handrails are loose or broken. Make sure that both sides of any steps have handrails. Any raised decks and porches should have guardrails on the edges. Have any leaves, snow, or ice cleared regularly. Use sand or salt on walking paths during winter. Clean up any spills in your garage right away. This includes oil or grease spills. What can I do in the bathroom? Use night lights. Install grab bars by the toilet and in the tub and shower. Do not use towel bars as grab bars. Use  non-skid mats or decals in the tub or shower. If you need to sit down in the shower, use a plastic, non-slip stool. Keep the floor dry. Clean up any water that spills on the floor as soon as it happens. Remove soap buildup in the tub or shower regularly. Attach bath mats securely with double-sided non-slip rug tape. Do not have throw rugs and other things on the floor that can make you trip. What can I do in the bedroom? Use night lights. Make sure that you have a light by your bed that is easy to reach. Do not use any sheets or blankets that are too big for your bed. They should not hang down onto the floor. Have a firm chair that has side arms. You can use this for support while you get dressed. Do not have throw rugs and other things on the floor that can make you trip. What can I do in the kitchen? Clean up any spills right away. Avoid walking on wet floors. Keep items that you use a lot in easy-to-reach places. If you need to reach something above you, use a strong step stool  that has a grab bar. Keep electrical cords out of the way. Do not use floor polish or wax that makes floors slippery. If you must use wax, use non-skid floor wax. Do not have throw rugs and other things on the floor that can make you trip. What can I do with my stairs? Do not leave any items on the stairs. Make sure that there are handrails on both sides of the stairs and use them. Fix handrails that are broken or loose. Make sure that handrails are as long as the stairways. Check any carpeting to make sure that it is firmly attached to the stairs. Fix any carpet that is loose or worn. Avoid having throw rugs at the top or bottom of the stairs. If you do have throw rugs, attach them to the floor with carpet tape. Make sure that you have a light switch at the top of the stairs and the bottom of the stairs. If you do not have them, ask someone to add them for you. What else can I do to help prevent falls? Wear  shoes that: Do not have high heels. Have rubber bottoms. Are comfortable and fit you well. Are closed at the toe. Do not wear sandals. If you use a stepladder: Make sure that it is fully opened. Do not climb a closed stepladder. Make sure that both sides of the stepladder are locked into place. Ask someone to hold it for you, if possible. Clearly mark and make sure that you can see: Any grab bars or handrails. First and last steps. Where the edge of each step is. Use tools that help you move around (mobility aids) if they are needed. These include: Canes. Walkers. Scooters. Crutches. Turn on the lights when you go into a dark area. Replace any light bulbs as soon as they burn out. Set up your furniture so you have a clear path. Avoid moving your furniture around. If any of your floors are uneven, fix them. If there are any pets around you, be aware of where they are. Review your medicines with your doctor. Some medicines can make you feel dizzy. This can increase your chance of falling. Ask your doctor what other things that you can do to help prevent falls. This information is not intended to replace advice given to you by your health care provider. Make sure you discuss any questions you have with your health care provider. Document Released: 09/18/2009 Document Revised: 04/29/2016 Document Reviewed: 12/27/2014 Elsevier Interactive Patient Education  2017 [employer].

## 2023-03-16 DIAGNOSIS — Z435 Encounter for attention to cystostomy: Secondary | ICD-10-CM | POA: Diagnosis not present

## 2023-03-16 DIAGNOSIS — Z466 Encounter for fitting and adjustment of urinary device: Secondary | ICD-10-CM | POA: Diagnosis not present

## 2023-03-16 DIAGNOSIS — R296 Repeated falls: Secondary | ICD-10-CM | POA: Diagnosis not present

## 2023-03-16 DIAGNOSIS — S81801D Unspecified open wound, right lower leg, subsequent encounter: Secondary | ICD-10-CM | POA: Diagnosis not present

## 2023-03-16 DIAGNOSIS — I129 Hypertensive chronic kidney disease with stage 1 through stage 4 chronic kidney disease, or unspecified chronic kidney disease: Secondary | ICD-10-CM | POA: Diagnosis not present

## 2023-03-16 DIAGNOSIS — N35919 Unspecified urethral stricture, male, unspecified site: Secondary | ICD-10-CM | POA: Diagnosis not present

## 2023-03-22 ENCOUNTER — Other Ambulatory Visit: Payer: Self-pay | Admitting: Family Medicine

## 2023-03-23 ENCOUNTER — Ambulatory Visit: Payer: Medicare Other | Admitting: Family Medicine

## 2023-03-25 DIAGNOSIS — N4889 Other specified disorders of penis: Secondary | ICD-10-CM | POA: Diagnosis not present

## 2023-03-25 DIAGNOSIS — N183 Chronic kidney disease, stage 3 unspecified: Secondary | ICD-10-CM | POA: Diagnosis not present

## 2023-03-25 DIAGNOSIS — E1140 Type 2 diabetes mellitus with diabetic neuropathy, unspecified: Secondary | ICD-10-CM | POA: Diagnosis not present

## 2023-03-25 DIAGNOSIS — Z794 Long term (current) use of insulin: Secondary | ICD-10-CM | POA: Diagnosis not present

## 2023-03-25 DIAGNOSIS — Z9359 Other cystostomy status: Secondary | ICD-10-CM | POA: Diagnosis not present

## 2023-03-26 LAB — MICROALBUMIN / CREATININE URINE RATIO
Creatinine, Urine: 118.6 mg/dL
Microalb/Creat Ratio: 265 mg/g creat — ABNORMAL HIGH (ref 0–29)
Microalbumin, Urine: 314.1 ug/mL

## 2023-03-26 LAB — BASIC METABOLIC PANEL (7)
BUN/Creatinine Ratio: 20 (ref 10–24)
BUN: 29 mg/dL — ABNORMAL HIGH (ref 8–27)
CO2: 25 mmol/L (ref 20–29)
Chloride: 105 mmol/L (ref 96–106)
Creatinine, Ser: 1.46 mg/dL — ABNORMAL HIGH (ref 0.76–1.27)
Glucose: 193 mg/dL — ABNORMAL HIGH (ref 70–99)
Potassium: 4.2 mmol/L (ref 3.5–5.2)
Sodium: 144 mmol/L (ref 134–144)
eGFR: 46 mL/min/{1.73_m2} — ABNORMAL LOW (ref >59)

## 2023-03-26 LAB — LIPID PANEL
Chol/HDL Ratio: 3.3 ratio (ref 0.0–5.0)
Cholesterol, Total: 190 mg/dL (ref 100–199)
HDL: 57 mg/dL (ref >39)
LDL Chol Calc (NIH): 109 mg/dL — ABNORMAL HIGH (ref 0–99)
Triglycerides: 134 mg/dL (ref 0–149)
VLDL Cholesterol Cal: 24 mg/dL (ref 5–40)

## 2023-03-26 LAB — HEPATIC FUNCTION PANEL
ALT: 11 IU/L (ref 0–44)
AST: 14 IU/L (ref 0–40)
Albumin: 4.3 g/dL (ref 3.7–4.7)
Alkaline Phosphatase: 72 IU/L (ref 44–121)
Bilirubin Total: 0.3 mg/dL (ref 0.0–1.2)
Bilirubin, Direct: <0.1 mg/dL (ref 0.00–0.40)
Total Protein: 6.9 g/dL (ref 6.0–8.5)

## 2023-03-26 LAB — HEMOGLOBIN A1C
Est. average glucose Bld gHb Est-mCnc: 154 mg/dL
Hgb A1c MFr Bld: 7 % — ABNORMAL HIGH (ref 4.8–5.6)

## 2023-03-30 ENCOUNTER — Ambulatory Visit: Payer: Medicare Other | Admitting: Family Medicine

## 2023-03-30 ENCOUNTER — Ambulatory Visit (INDEPENDENT_AMBULATORY_CARE_PROVIDER_SITE_OTHER): Payer: Medicare Other | Admitting: Family Medicine

## 2023-03-30 ENCOUNTER — Other Ambulatory Visit: Payer: Self-pay | Admitting: *Deleted

## 2023-03-30 VITALS — BP 131/78 | HR 78 | Ht 74.0 in | Wt 161.8 lb

## 2023-03-30 DIAGNOSIS — N4889 Other specified disorders of penis: Secondary | ICD-10-CM

## 2023-03-30 DIAGNOSIS — C61 Malignant neoplasm of prostate: Secondary | ICD-10-CM | POA: Diagnosis not present

## 2023-03-30 DIAGNOSIS — Z794 Long term (current) use of insulin: Secondary | ICD-10-CM

## 2023-03-30 DIAGNOSIS — E1140 Type 2 diabetes mellitus with diabetic neuropathy, unspecified: Secondary | ICD-10-CM | POA: Diagnosis not present

## 2023-03-30 DIAGNOSIS — N183 Chronic kidney disease, stage 3 unspecified: Secondary | ICD-10-CM

## 2023-03-30 MED ORDER — DAPAGLIFLOZIN PROPANEDIOL 5 MG PO TABS: 5.0000 mg | ORAL_TABLET | Freq: Every day | ORAL | 5 refills | Status: DC

## 2023-03-30 NOTE — Telephone Encounter (Signed)
Lab work ordered in [employer]. Referral to [facility] ordered in [facility].

## 2023-03-30 NOTE — Progress Notes (Signed)
   Subjective:    Patient ID: Luke Solis, male    DOB: 07-20-1931, 89 y.o.   MRN: [id number]  HPI Patient arrives 3 week follow up. 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 through his penis He suffers with intermittent pain and discomfort in his penis as well as his sensation that he has to urinate He denies lower abdominal pain denies fever chills sweats States [facility] They are in the process of switching to a new urologist to get further opinion to try to help him with some of the symptomatic issues he has  He does utilize Lyrica for his neuropathic neuropathy pain in his feet from diabetes but also the medication is to try to help with his penile pain We have tried lidocaine gel on the end of the penis before and this did not help enough or help at all  Recently he has had lab work which shows CKD 3 but also shows increasing protein in the urine Because of this we are discussing adding medications he has suffered with low blood pressure because of this for now will stay away from ACE inhibitors and ARB.  Will try Farxiga  Patient states having pain in the penis area.    Review of Systems     Objective:   Physical Exam  General-in no acute distress Eyes-no discharge Lungs-respiratory rate normal, CTA CV-no murmurs,RRR Extremities skin warm dry no edema Neuro grossly normal Behavior normal, alert       Assessment & Plan:  1. Stage 3 chronic kidney disease, unspecified whether stage 3a or 3b CKD We will add Marcelline Deist Given his elevated creatinine I am hesitant to add a ARB/ACE  inhibitor  - Basic metabolic panel - Ambulatory referral to Nephrology  2. Penile pain Currently taking Lyrica Will be seeing specialist coming up from [facility] in May   3. Type 2 diabetes mellitus with diabetic neuropathy, with long-term current use of insulin Reduce long-acting insulin from 14 units down to 12 units avoid low sugars monitor sugars closely let us know if any problems  4. Prostate cancer Followed by specialist will be seeing specialist at [facility]  Check metabolic 7 in 2 weeks Follow-up office visit 4 weeks

## 2023-03-31 DIAGNOSIS — Z435 Encounter for attention to cystostomy: Secondary | ICD-10-CM | POA: Diagnosis not present

## 2023-03-31 DIAGNOSIS — Z466 Encounter for fitting and adjustment of urinary device: Secondary | ICD-10-CM | POA: Diagnosis not present

## 2023-03-31 DIAGNOSIS — R296 Repeated falls: Secondary | ICD-10-CM | POA: Diagnosis not present

## 2023-03-31 DIAGNOSIS — I129 Hypertensive chronic kidney disease with stage 1 through stage 4 chronic kidney disease, or unspecified chronic kidney disease: Secondary | ICD-10-CM | POA: Diagnosis not present

## 2023-03-31 DIAGNOSIS — S81801D Unspecified open wound, right lower leg, subsequent encounter: Secondary | ICD-10-CM | POA: Diagnosis not present

## 2023-03-31 DIAGNOSIS — N35919 Unspecified urethral stricture, male, unspecified site: Secondary | ICD-10-CM | POA: Diagnosis not present

## 2023-04-02 ENCOUNTER — Other Ambulatory Visit: Payer: Self-pay | Admitting: Family Medicine

## 2023-04-04 ENCOUNTER — Other Ambulatory Visit: Payer: Self-pay | Admitting: Family Medicine

## 2023-04-05 DIAGNOSIS — Z87440 Personal history of urinary (tract) infections: Secondary | ICD-10-CM | POA: Diagnosis not present

## 2023-04-05 DIAGNOSIS — N1832 Chronic kidney disease, stage 3b: Secondary | ICD-10-CM | POA: Diagnosis not present

## 2023-04-05 DIAGNOSIS — Z8673 Personal history of transient ischemic attack (TIA), and cerebral infarction without residual deficits: Secondary | ICD-10-CM | POA: Diagnosis not present

## 2023-04-05 DIAGNOSIS — E1165 Type 2 diabetes mellitus with hyperglycemia: Secondary | ICD-10-CM | POA: Diagnosis not present

## 2023-04-05 DIAGNOSIS — Z9181 History of falling: Secondary | ICD-10-CM | POA: Diagnosis not present

## 2023-04-05 DIAGNOSIS — R4189 Other symptoms and signs involving cognitive functions and awareness: Secondary | ICD-10-CM | POA: Diagnosis not present

## 2023-04-05 DIAGNOSIS — N35919 Unspecified urethral stricture, male, unspecified site: Secondary | ICD-10-CM | POA: Diagnosis not present

## 2023-04-05 DIAGNOSIS — E1122 Type 2 diabetes mellitus with diabetic chronic kidney disease: Secondary | ICD-10-CM | POA: Diagnosis not present

## 2023-04-05 DIAGNOSIS — R296 Repeated falls: Secondary | ICD-10-CM | POA: Diagnosis not present

## 2023-04-05 DIAGNOSIS — E113211 Type 2 diabetes mellitus with mild nonproliferative diabetic retinopathy with macular edema, right eye: Secondary | ICD-10-CM | POA: Diagnosis not present

## 2023-04-05 DIAGNOSIS — G4733 Obstructive sleep apnea (adult) (pediatric): Secondary | ICD-10-CM | POA: Diagnosis not present

## 2023-04-05 DIAGNOSIS — Z8616 Personal history of COVID-19: Secondary | ICD-10-CM | POA: Diagnosis not present

## 2023-04-05 DIAGNOSIS — E1142 Type 2 diabetes mellitus with diabetic polyneuropathy: Secondary | ICD-10-CM | POA: Diagnosis not present

## 2023-04-05 DIAGNOSIS — I951 Orthostatic hypotension: Secondary | ICD-10-CM | POA: Diagnosis not present

## 2023-04-05 DIAGNOSIS — Z794 Long term (current) use of insulin: Secondary | ICD-10-CM | POA: Diagnosis not present

## 2023-04-05 DIAGNOSIS — Z466 Encounter for fitting and adjustment of urinary device: Secondary | ICD-10-CM | POA: Diagnosis not present

## 2023-04-05 DIAGNOSIS — I129 Hypertensive chronic kidney disease with stage 1 through stage 4 chronic kidney disease, or unspecified chronic kidney disease: Secondary | ICD-10-CM | POA: Diagnosis not present

## 2023-04-05 DIAGNOSIS — Z435 Encounter for attention to cystostomy: Secondary | ICD-10-CM | POA: Diagnosis not present

## 2023-04-05 DIAGNOSIS — E785 Hyperlipidemia, unspecified: Secondary | ICD-10-CM | POA: Diagnosis not present

## 2023-04-05 DIAGNOSIS — I2510 Atherosclerotic heart disease of native coronary artery without angina pectoris: Secondary | ICD-10-CM | POA: Diagnosis not present

## 2023-04-05 DIAGNOSIS — Z8546 Personal history of malignant neoplasm of prostate: Secondary | ICD-10-CM | POA: Diagnosis not present

## 2023-04-11 DIAGNOSIS — Z435 Encounter for attention to cystostomy: Secondary | ICD-10-CM | POA: Diagnosis not present

## 2023-04-11 DIAGNOSIS — I951 Orthostatic hypotension: Secondary | ICD-10-CM | POA: Diagnosis not present

## 2023-04-11 DIAGNOSIS — I129 Hypertensive chronic kidney disease with stage 1 through stage 4 chronic kidney disease, or unspecified chronic kidney disease: Secondary | ICD-10-CM | POA: Diagnosis not present

## 2023-04-11 DIAGNOSIS — E1142 Type 2 diabetes mellitus with diabetic polyneuropathy: Secondary | ICD-10-CM | POA: Diagnosis not present

## 2023-04-11 DIAGNOSIS — N35919 Unspecified urethral stricture, male, unspecified site: Secondary | ICD-10-CM | POA: Diagnosis not present

## 2023-04-11 DIAGNOSIS — E1122 Type 2 diabetes mellitus with diabetic chronic kidney disease: Secondary | ICD-10-CM | POA: Diagnosis not present

## 2023-04-11 DIAGNOSIS — N1832 Chronic kidney disease, stage 3b: Secondary | ICD-10-CM | POA: Diagnosis not present

## 2023-04-11 DIAGNOSIS — I2510 Atherosclerotic heart disease of native coronary artery without angina pectoris: Secondary | ICD-10-CM | POA: Diagnosis not present

## 2023-04-11 DIAGNOSIS — E1165 Type 2 diabetes mellitus with hyperglycemia: Secondary | ICD-10-CM | POA: Diagnosis not present

## 2023-04-11 DIAGNOSIS — Z466 Encounter for fitting and adjustment of urinary device: Secondary | ICD-10-CM | POA: Diagnosis not present

## 2023-04-11 DIAGNOSIS — E113211 Type 2 diabetes mellitus with mild nonproliferative diabetic retinopathy with macular edema, right eye: Secondary | ICD-10-CM | POA: Diagnosis not present

## 2023-04-11 DIAGNOSIS — R296 Repeated falls: Secondary | ICD-10-CM | POA: Diagnosis not present

## 2023-04-12 DIAGNOSIS — N3281 Overactive bladder: Secondary | ICD-10-CM | POA: Diagnosis not present

## 2023-04-13 ENCOUNTER — Other Ambulatory Visit: Payer: Self-pay | Admitting: Family Medicine

## 2023-04-15 DIAGNOSIS — R296 Repeated falls: Secondary | ICD-10-CM | POA: Diagnosis not present

## 2023-04-15 DIAGNOSIS — Z466 Encounter for fitting and adjustment of urinary device: Secondary | ICD-10-CM | POA: Diagnosis not present

## 2023-04-15 DIAGNOSIS — N35919 Unspecified urethral stricture, male, unspecified site: Secondary | ICD-10-CM | POA: Diagnosis not present

## 2023-04-15 DIAGNOSIS — Z435 Encounter for attention to cystostomy: Secondary | ICD-10-CM | POA: Diagnosis not present

## 2023-04-15 DIAGNOSIS — E1122 Type 2 diabetes mellitus with diabetic chronic kidney disease: Secondary | ICD-10-CM | POA: Diagnosis not present

## 2023-04-15 DIAGNOSIS — I129 Hypertensive chronic kidney disease with stage 1 through stage 4 chronic kidney disease, or unspecified chronic kidney disease: Secondary | ICD-10-CM | POA: Diagnosis not present

## 2023-04-18 ENCOUNTER — Telehealth: Payer: Self-pay

## 2023-04-18 NOTE — Telephone Encounter (Signed)
Error

## 2023-04-18 NOTE — Telephone Encounter (Signed)
6 refills °

## 2023-04-19 ENCOUNTER — Telehealth: Payer: Self-pay

## 2023-04-19 NOTE — Telephone Encounter (Signed)
Pt daughter called medication did not go through wanted it to be resent Rosuvastatin Calcium 5 mg and the Potassium Chloride 10mg     Theodoro Kalata [phone]

## 2023-04-19 NOTE — Telephone Encounter (Signed)
Received via fax Rx request: Prescription sent electronically to pharmacy  

## 2023-04-23 ENCOUNTER — Telehealth: Payer: Self-pay | Admitting: Family Medicine

## 2023-04-23 ENCOUNTER — Encounter: Payer: Self-pay | Admitting: Family Medicine

## 2023-04-23 NOTE — Telephone Encounter (Signed)
Erica  Please print his last couple office notes with me as well as past 3 months any metabolic 7 CMP or kidney functions along with urine micro proteins  Also please print the letter that I will do for him today Please put these in a packet and deliver them to me thank you I will deliver this to the nephrologist office that he will be seeing thank you-Dr. Lorin Picket

## 2023-04-23 NOTE — Progress Notes (Signed)
Please print and forward to me with the medical records as per telephone message thank you

## 2023-04-25 NOTE — Telephone Encounter (Signed)
Completed print outs in provider"s chair 5/20/224

## 2023-04-25 NOTE — Telephone Encounter (Signed)
Erica  Please print his last couple office notes with me as well as past 3 months any metabolic 7 CMP or kidney functions along with urine micro proteins  Also please print the letter that I will do for him today Please put these in a packet and deliver them to me thank you I will deliver this to the nephrologist office that he will be seeing thank you-Dr. Scott 

## 2023-04-28 DIAGNOSIS — E1122 Type 2 diabetes mellitus with diabetic chronic kidney disease: Secondary | ICD-10-CM | POA: Diagnosis not present

## 2023-04-28 DIAGNOSIS — I129 Hypertensive chronic kidney disease with stage 1 through stage 4 chronic kidney disease, or unspecified chronic kidney disease: Secondary | ICD-10-CM | POA: Diagnosis not present

## 2023-04-28 DIAGNOSIS — R296 Repeated falls: Secondary | ICD-10-CM | POA: Diagnosis not present

## 2023-04-28 DIAGNOSIS — Z466 Encounter for fitting and adjustment of urinary device: Secondary | ICD-10-CM | POA: Diagnosis not present

## 2023-04-28 DIAGNOSIS — Z435 Encounter for attention to cystostomy: Secondary | ICD-10-CM | POA: Diagnosis not present

## 2023-04-28 DIAGNOSIS — N35919 Unspecified urethral stricture, male, unspecified site: Secondary | ICD-10-CM | POA: Diagnosis not present

## 2023-04-28 NOTE — Telephone Encounter (Signed)
Thank you for doing this-please fax to [facility] on [address] [place]

## 2023-05-03 DIAGNOSIS — R809 Proteinuria, unspecified: Secondary | ICD-10-CM | POA: Diagnosis not present

## 2023-05-03 DIAGNOSIS — I10 Essential (primary) hypertension: Secondary | ICD-10-CM | POA: Diagnosis not present

## 2023-05-03 DIAGNOSIS — N1831 Chronic kidney disease, stage 3a: Secondary | ICD-10-CM | POA: Diagnosis not present

## 2023-05-03 DIAGNOSIS — E1122 Type 2 diabetes mellitus with diabetic chronic kidney disease: Secondary | ICD-10-CM | POA: Diagnosis not present

## 2023-05-04 ENCOUNTER — Other Ambulatory Visit (HOSPITAL_COMMUNITY): Payer: Self-pay | Admitting: Nephrology

## 2023-05-04 DIAGNOSIS — L609 Nail disorder, unspecified: Secondary | ICD-10-CM | POA: Diagnosis not present

## 2023-05-04 DIAGNOSIS — E1122 Type 2 diabetes mellitus with diabetic chronic kidney disease: Secondary | ICD-10-CM | POA: Diagnosis not present

## 2023-05-04 DIAGNOSIS — N1831 Chronic kidney disease, stage 3a: Secondary | ICD-10-CM | POA: Diagnosis not present

## 2023-05-04 DIAGNOSIS — I10 Essential (primary) hypertension: Secondary | ICD-10-CM | POA: Diagnosis not present

## 2023-05-04 DIAGNOSIS — E1140 Type 2 diabetes mellitus with diabetic neuropathy, unspecified: Secondary | ICD-10-CM | POA: Diagnosis not present

## 2023-05-04 DIAGNOSIS — M79672 Pain in left foot: Secondary | ICD-10-CM | POA: Diagnosis not present

## 2023-05-04 DIAGNOSIS — R809 Proteinuria, unspecified: Secondary | ICD-10-CM | POA: Diagnosis not present

## 2023-05-04 DIAGNOSIS — M79671 Pain in right foot: Secondary | ICD-10-CM | POA: Diagnosis not present

## 2023-05-05 ENCOUNTER — Encounter: Payer: Self-pay | Admitting: Family Medicine

## 2023-05-05 ENCOUNTER — Ambulatory Visit (INDEPENDENT_AMBULATORY_CARE_PROVIDER_SITE_OTHER): Payer: Medicare Other | Admitting: Family Medicine

## 2023-05-05 VITALS — BP 124/78 | HR 70 | Ht 74.0 in | Wt 160.4 lb

## 2023-05-05 DIAGNOSIS — R296 Repeated falls: Secondary | ICD-10-CM | POA: Diagnosis not present

## 2023-05-05 DIAGNOSIS — Z435 Encounter for attention to cystostomy: Secondary | ICD-10-CM | POA: Diagnosis not present

## 2023-05-05 DIAGNOSIS — R4189 Other symptoms and signs involving cognitive functions and awareness: Secondary | ICD-10-CM | POA: Diagnosis not present

## 2023-05-05 DIAGNOSIS — I129 Hypertensive chronic kidney disease with stage 1 through stage 4 chronic kidney disease, or unspecified chronic kidney disease: Secondary | ICD-10-CM | POA: Diagnosis not present

## 2023-05-05 DIAGNOSIS — Z9181 History of falling: Secondary | ICD-10-CM | POA: Diagnosis not present

## 2023-05-05 DIAGNOSIS — G4733 Obstructive sleep apnea (adult) (pediatric): Secondary | ICD-10-CM | POA: Diagnosis not present

## 2023-05-05 DIAGNOSIS — E1165 Type 2 diabetes mellitus with hyperglycemia: Secondary | ICD-10-CM | POA: Diagnosis not present

## 2023-05-05 DIAGNOSIS — N1832 Chronic kidney disease, stage 3b: Secondary | ICD-10-CM | POA: Diagnosis not present

## 2023-05-05 DIAGNOSIS — E1142 Type 2 diabetes mellitus with diabetic polyneuropathy: Secondary | ICD-10-CM | POA: Diagnosis not present

## 2023-05-05 DIAGNOSIS — Z87440 Personal history of urinary (tract) infections: Secondary | ICD-10-CM | POA: Diagnosis not present

## 2023-05-05 DIAGNOSIS — Z8673 Personal history of transient ischemic attack (TIA), and cerebral infarction without residual deficits: Secondary | ICD-10-CM | POA: Diagnosis not present

## 2023-05-05 DIAGNOSIS — I10 Essential (primary) hypertension: Secondary | ICD-10-CM | POA: Diagnosis not present

## 2023-05-05 DIAGNOSIS — Z466 Encounter for fitting and adjustment of urinary device: Secondary | ICD-10-CM | POA: Diagnosis not present

## 2023-05-05 DIAGNOSIS — N35919 Unspecified urethral stricture, male, unspecified site: Secondary | ICD-10-CM | POA: Diagnosis not present

## 2023-05-05 DIAGNOSIS — Z8616 Personal history of COVID-19: Secondary | ICD-10-CM | POA: Diagnosis not present

## 2023-05-05 DIAGNOSIS — E1122 Type 2 diabetes mellitus with diabetic chronic kidney disease: Secondary | ICD-10-CM | POA: Diagnosis not present

## 2023-05-05 DIAGNOSIS — Z8546 Personal history of malignant neoplasm of prostate: Secondary | ICD-10-CM | POA: Diagnosis not present

## 2023-05-05 DIAGNOSIS — E785 Hyperlipidemia, unspecified: Secondary | ICD-10-CM | POA: Diagnosis not present

## 2023-05-05 DIAGNOSIS — I951 Orthostatic hypotension: Secondary | ICD-10-CM | POA: Diagnosis not present

## 2023-05-05 DIAGNOSIS — E113211 Type 2 diabetes mellitus with mild nonproliferative diabetic retinopathy with macular edema, right eye: Secondary | ICD-10-CM | POA: Diagnosis not present

## 2023-05-05 DIAGNOSIS — I2510 Atherosclerotic heart disease of native coronary artery without angina pectoris: Secondary | ICD-10-CM | POA: Diagnosis not present

## 2023-05-05 DIAGNOSIS — Z794 Long term (current) use of insulin: Secondary | ICD-10-CM | POA: Diagnosis not present

## 2023-05-05 DIAGNOSIS — N183 Chronic kidney disease, stage 3 unspecified: Secondary | ICD-10-CM

## 2023-05-05 NOTE — Progress Notes (Signed)
   Subjective:    Patient ID: Luke Solis, male    DOB: [birth year], 89 y.o.   MRN: [id number]  HPI Patient arrives to for 4 month follow up. Stage 3 chronic kidney disease, unspecified whether stage 3a or 3b CKD (HCC)  Essential hypertension, benign - Plan: Basic Metabolic Panel .[facility] Outpatient Encounter Medications as of 05/05/2023  Medication Sig   amLODipine (NORVASC) 5 MG tablet Take 1 tablet (5 mg total) by mouth daily.   Continuous Blood Gluc Receiver (FREESTYLE LIBRE 14 DAY READER) DEVI On insulin shots 4 times a day Dx E11.9   Continuous Blood Gluc Sensor (FREESTYLE LIBRE 2 SENSOR) MISC ON INSULIN 4 TIMES DAILY TYPE 2 DM ON INSULIN.   fexofenadine (ALLEGRA) 180 MG tablet Take 1 tablet (180 mg total) by mouth daily as needed for allergies.   FORACARE PREMIUM V10 TEST test strip USE AS DIRECTED.   insulin glargine (LANTUS) 100 UNIT/ML injection Inject 14 Units into the skin at bedtime.   insulin lispro (HUMALOG KWIKPEN) 100 UNIT/ML KwikPen Inject insulin per sliding scale  101-175 - 3 units 176-250- 4 units  251-300- 5 units  301-350- 6 units 351-450- 7 units > greater than 450 - please call   Insulin Pen Needle (GLOBAL EASE INJECT PEN NEEDLES) 32G X 4 MM MISC USE TO INJECT LANTUS EVERY EVENING AND HUMALOG THREE (3) TIMES DAILY.   ketoconazole (NIZORAL) 2 % cream APPLY THIN AMOUNT TWICE DAILY TO INFLAMMED AREA OF GENITALS.   Multiple Vitamins-Minerals (PRESERVISION AREDS 2) CAPS Take 1 capsule by mouth 2 (two) times daily.   mupirocin ointment (BACTROBAN) 2 % Apply 1 Application topically 2 (two) times daily.   nitroGLYCERIN (NITROSTAT) 0.4 MG SL tablet Place 1 tablet (0.4 mg total) under the tongue every 5 (five) minutes as needed for chest pain.   ofloxacin (OCUFLOX) 0.3 % ophthalmic solution    potassium chloride (KLOR-CON) 10 MEQ tablet take 1 tablet once daily.   pregabalin (LYRICA) 25 MG capsule TAKE 1 CAPSULE 3 TIMES DIALY AS NEEDED FOR DIABETIC NEUROPATHY.    rosuvastatin (CRESTOR) 5 MG tablet take 1 tablet once daily.   Vitamin D, Ergocalciferol, (DRISDOL) 1.25 MG (50000 UNIT) CAPS capsule Take 50,000 Units by mouth every 7 (seven) days.   No facility-administered encounter medications on file as of 05/05/2023.  Patient had visit with nephrologist They feel that losartan would be a good choice They did not feel further workup was necessary And the patient will be monitored closely will see nephrology next month    Review of Systems     Objective:   Physical Exam  General-in no acute distress Eyes-no discharge Lungs-respiratory rate normal, CTA CV-no murmurs,RRR Extremities skin warm dry no edema Neuro grossly normal Behavior normal, alert       Assessment & Plan:  1. Stage 3 chronic kidney disease, unspecified whether stage 3a or 3b CKD (HCC) Very important for patient to eat healthy stay adequately hydrated he will be start losartan 100 near future we will need to do a follow-up visit 7 to 10 to 14 days after starting this he will see nephrology in approximately 1 month and he will follow-up with [place] in 2 months  2. Essential hypertension, benign Labs ordered continue medication add losartan - Basic Metabolic Panel If he develops lightheadedness he needs follow-up right away  As for the diabetes at times glucose readings are elevated but recent A1c looked good continue current measures

## 2023-05-11 ENCOUNTER — Other Ambulatory Visit: Payer: Self-pay | Admitting: Family Medicine

## 2023-05-19 ENCOUNTER — Ambulatory Visit: Payer: Medicare Other | Admitting: Family Medicine

## 2023-05-26 ENCOUNTER — Ambulatory Visit (HOSPITAL_COMMUNITY)
Admission: RE | Admit: 2023-05-26 | Discharge: 2023-05-26 | Disposition: A | Discharge status: Home / Self Care | Payer: Medicare Other | Source: Ambulatory Visit | Attending: Nephrology | Admitting: Nephrology

## 2023-05-26 DIAGNOSIS — N1831 Chronic kidney disease, stage 3a: Secondary | ICD-10-CM | POA: Diagnosis not present

## 2023-05-26 DIAGNOSIS — N189 Chronic kidney disease, unspecified: Secondary | ICD-10-CM | POA: Diagnosis not present

## 2023-05-26 DIAGNOSIS — E1122 Type 2 diabetes mellitus with diabetic chronic kidney disease: Secondary | ICD-10-CM | POA: Insufficient documentation

## 2023-05-26 DIAGNOSIS — N281 Cyst of kidney, acquired: Secondary | ICD-10-CM | POA: Diagnosis not present

## 2023-05-30 DIAGNOSIS — I10 Essential (primary) hypertension: Secondary | ICD-10-CM | POA: Diagnosis not present

## 2023-05-31 ENCOUNTER — Other Ambulatory Visit: Payer: Self-pay | Admitting: Family Medicine

## 2023-05-31 DIAGNOSIS — E119 Type 2 diabetes mellitus without complications: Secondary | ICD-10-CM | POA: Diagnosis not present

## 2023-05-31 DIAGNOSIS — H35033 Hypertensive retinopathy, bilateral: Secondary | ICD-10-CM | POA: Diagnosis not present

## 2023-05-31 DIAGNOSIS — H353211 Exudative age-related macular degeneration, right eye, with active choroidal neovascularization: Secondary | ICD-10-CM | POA: Diagnosis not present

## 2023-05-31 DIAGNOSIS — H353122 Nonexudative age-related macular degeneration, left eye, intermediate dry stage: Secondary | ICD-10-CM | POA: Diagnosis not present

## 2023-05-31 DIAGNOSIS — H43813 Vitreous degeneration, bilateral: Secondary | ICD-10-CM | POA: Diagnosis not present

## 2023-05-31 LAB — BASIC METABOLIC PANEL
BUN/Creatinine Ratio: 19 (ref 10–24)
BUN: 28 mg/dL — ABNORMAL HIGH (ref 8–27)
CO2: 24 mmol/L (ref 20–29)
Calcium: 9.2 mg/dL (ref 8.6–10.2)
Chloride: 108 mmol/L — ABNORMAL HIGH (ref 96–106)
Creatinine, Ser: 1.47 mg/dL — ABNORMAL HIGH (ref 0.76–1.27)
Glucose: 123 mg/dL — ABNORMAL HIGH (ref 70–99)
Potassium: 4.1 mmol/L (ref 3.5–5.2)
Sodium: 143 mmol/L (ref 134–144)
eGFR: 45 mL/min/{1.73_m2} — ABNORMAL LOW (ref >59)

## 2023-06-01 DIAGNOSIS — E1122 Type 2 diabetes mellitus with diabetic chronic kidney disease: Secondary | ICD-10-CM | POA: Diagnosis not present

## 2023-06-01 DIAGNOSIS — R809 Proteinuria, unspecified: Secondary | ICD-10-CM | POA: Diagnosis not present

## 2023-06-01 DIAGNOSIS — N1832 Chronic kidney disease, stage 3b: Secondary | ICD-10-CM | POA: Diagnosis not present

## 2023-06-01 DIAGNOSIS — I10 Essential (primary) hypertension: Secondary | ICD-10-CM | POA: Diagnosis not present

## 2023-06-02 DIAGNOSIS — Z435 Encounter for attention to cystostomy: Secondary | ICD-10-CM | POA: Diagnosis not present

## 2023-06-02 DIAGNOSIS — R296 Repeated falls: Secondary | ICD-10-CM | POA: Diagnosis not present

## 2023-06-02 DIAGNOSIS — E1122 Type 2 diabetes mellitus with diabetic chronic kidney disease: Secondary | ICD-10-CM | POA: Diagnosis not present

## 2023-06-02 DIAGNOSIS — Z466 Encounter for fitting and adjustment of urinary device: Secondary | ICD-10-CM | POA: Diagnosis not present

## 2023-06-02 DIAGNOSIS — N35919 Unspecified urethral stricture, male, unspecified site: Secondary | ICD-10-CM | POA: Diagnosis not present

## 2023-06-02 DIAGNOSIS — I129 Hypertensive chronic kidney disease with stage 1 through stage 4 chronic kidney disease, or unspecified chronic kidney disease: Secondary | ICD-10-CM | POA: Diagnosis not present

## 2023-06-04 DIAGNOSIS — N1832 Chronic kidney disease, stage 3b: Secondary | ICD-10-CM | POA: Diagnosis not present

## 2023-06-04 DIAGNOSIS — Z794 Long term (current) use of insulin: Secondary | ICD-10-CM | POA: Diagnosis not present

## 2023-06-04 DIAGNOSIS — E1122 Type 2 diabetes mellitus with diabetic chronic kidney disease: Secondary | ICD-10-CM | POA: Diagnosis not present

## 2023-06-04 DIAGNOSIS — G4733 Obstructive sleep apnea (adult) (pediatric): Secondary | ICD-10-CM | POA: Diagnosis not present

## 2023-06-04 DIAGNOSIS — E113211 Type 2 diabetes mellitus with mild nonproliferative diabetic retinopathy with macular edema, right eye: Secondary | ICD-10-CM | POA: Diagnosis not present

## 2023-06-04 DIAGNOSIS — N35919 Unspecified urethral stricture, male, unspecified site: Secondary | ICD-10-CM | POA: Diagnosis not present

## 2023-06-04 DIAGNOSIS — Z8546 Personal history of malignant neoplasm of prostate: Secondary | ICD-10-CM | POA: Diagnosis not present

## 2023-06-04 DIAGNOSIS — Z9181 History of falling: Secondary | ICD-10-CM | POA: Diagnosis not present

## 2023-06-04 DIAGNOSIS — R296 Repeated falls: Secondary | ICD-10-CM | POA: Diagnosis not present

## 2023-06-04 DIAGNOSIS — E1165 Type 2 diabetes mellitus with hyperglycemia: Secondary | ICD-10-CM | POA: Diagnosis not present

## 2023-06-04 DIAGNOSIS — Z435 Encounter for attention to cystostomy: Secondary | ICD-10-CM | POA: Diagnosis not present

## 2023-06-04 DIAGNOSIS — Z87440 Personal history of urinary (tract) infections: Secondary | ICD-10-CM | POA: Diagnosis not present

## 2023-06-04 DIAGNOSIS — I2510 Atherosclerotic heart disease of native coronary artery without angina pectoris: Secondary | ICD-10-CM | POA: Diagnosis not present

## 2023-06-04 DIAGNOSIS — E785 Hyperlipidemia, unspecified: Secondary | ICD-10-CM | POA: Diagnosis not present

## 2023-06-04 DIAGNOSIS — I951 Orthostatic hypotension: Secondary | ICD-10-CM | POA: Diagnosis not present

## 2023-06-04 DIAGNOSIS — R4189 Other symptoms and signs involving cognitive functions and awareness: Secondary | ICD-10-CM | POA: Diagnosis not present

## 2023-06-04 DIAGNOSIS — Z8673 Personal history of transient ischemic attack (TIA), and cerebral infarction without residual deficits: Secondary | ICD-10-CM | POA: Diagnosis not present

## 2023-06-04 DIAGNOSIS — E1142 Type 2 diabetes mellitus with diabetic polyneuropathy: Secondary | ICD-10-CM | POA: Diagnosis not present

## 2023-06-04 DIAGNOSIS — Z466 Encounter for fitting and adjustment of urinary device: Secondary | ICD-10-CM | POA: Diagnosis not present

## 2023-06-04 DIAGNOSIS — Z8616 Personal history of COVID-19: Secondary | ICD-10-CM | POA: Diagnosis not present

## 2023-06-04 DIAGNOSIS — I129 Hypertensive chronic kidney disease with stage 1 through stage 4 chronic kidney disease, or unspecified chronic kidney disease: Secondary | ICD-10-CM | POA: Diagnosis not present

## 2023-06-13 DIAGNOSIS — Z466 Encounter for fitting and adjustment of urinary device: Secondary | ICD-10-CM | POA: Diagnosis not present

## 2023-06-13 DIAGNOSIS — E1142 Type 2 diabetes mellitus with diabetic polyneuropathy: Secondary | ICD-10-CM | POA: Diagnosis not present

## 2023-06-13 DIAGNOSIS — I951 Orthostatic hypotension: Secondary | ICD-10-CM | POA: Diagnosis not present

## 2023-06-13 DIAGNOSIS — R296 Repeated falls: Secondary | ICD-10-CM | POA: Diagnosis not present

## 2023-06-13 DIAGNOSIS — E1165 Type 2 diabetes mellitus with hyperglycemia: Secondary | ICD-10-CM | POA: Diagnosis not present

## 2023-06-13 DIAGNOSIS — I2510 Atherosclerotic heart disease of native coronary artery without angina pectoris: Secondary | ICD-10-CM | POA: Diagnosis not present

## 2023-06-13 DIAGNOSIS — Z435 Encounter for attention to cystostomy: Secondary | ICD-10-CM | POA: Diagnosis not present

## 2023-06-13 DIAGNOSIS — I129 Hypertensive chronic kidney disease with stage 1 through stage 4 chronic kidney disease, or unspecified chronic kidney disease: Secondary | ICD-10-CM | POA: Diagnosis not present

## 2023-06-13 DIAGNOSIS — N1832 Chronic kidney disease, stage 3b: Secondary | ICD-10-CM | POA: Diagnosis not present

## 2023-06-13 DIAGNOSIS — E113211 Type 2 diabetes mellitus with mild nonproliferative diabetic retinopathy with macular edema, right eye: Secondary | ICD-10-CM | POA: Diagnosis not present

## 2023-06-13 DIAGNOSIS — N35919 Unspecified urethral stricture, male, unspecified site: Secondary | ICD-10-CM | POA: Diagnosis not present

## 2023-06-13 DIAGNOSIS — E1122 Type 2 diabetes mellitus with diabetic chronic kidney disease: Secondary | ICD-10-CM | POA: Diagnosis not present

## 2023-06-14 ENCOUNTER — Other Ambulatory Visit: Payer: Self-pay | Admitting: Family Medicine

## 2023-06-14 DIAGNOSIS — I10 Essential (primary) hypertension: Secondary | ICD-10-CM

## 2023-06-14 DIAGNOSIS — E1140 Type 2 diabetes mellitus with diabetic neuropathy, unspecified: Secondary | ICD-10-CM

## 2023-06-14 DIAGNOSIS — E785 Hyperlipidemia, unspecified: Secondary | ICD-10-CM

## 2023-06-14 DIAGNOSIS — N183 Chronic kidney disease, stage 3 unspecified: Secondary | ICD-10-CM

## 2023-06-14 NOTE — Telephone Encounter (Signed)
May have 3 refills on insulin Please order A1c, metabolic 7, lipid patient needs to do these labs before his follow-up visit on August 1 thank you

## 2023-06-15 NOTE — Telephone Encounter (Signed)
Informed patient's wife regarding doctor's message

## 2023-06-28 ENCOUNTER — Other Ambulatory Visit: Payer: Self-pay | Admitting: Family Medicine

## 2023-07-01 DIAGNOSIS — E1122 Type 2 diabetes mellitus with diabetic chronic kidney disease: Secondary | ICD-10-CM | POA: Diagnosis not present

## 2023-07-01 DIAGNOSIS — N35919 Unspecified urethral stricture, male, unspecified site: Secondary | ICD-10-CM | POA: Diagnosis not present

## 2023-07-01 DIAGNOSIS — R296 Repeated falls: Secondary | ICD-10-CM | POA: Diagnosis not present

## 2023-07-01 DIAGNOSIS — Z466 Encounter for fitting and adjustment of urinary device: Secondary | ICD-10-CM | POA: Diagnosis not present

## 2023-07-01 DIAGNOSIS — Z435 Encounter for attention to cystostomy: Secondary | ICD-10-CM | POA: Diagnosis not present

## 2023-07-01 DIAGNOSIS — I129 Hypertensive chronic kidney disease with stage 1 through stage 4 chronic kidney disease, or unspecified chronic kidney disease: Secondary | ICD-10-CM | POA: Diagnosis not present

## 2023-07-04 DIAGNOSIS — Z8616 Personal history of COVID-19: Secondary | ICD-10-CM | POA: Diagnosis not present

## 2023-07-04 DIAGNOSIS — Z87440 Personal history of urinary (tract) infections: Secondary | ICD-10-CM | POA: Diagnosis not present

## 2023-07-04 DIAGNOSIS — R296 Repeated falls: Secondary | ICD-10-CM | POA: Diagnosis not present

## 2023-07-04 DIAGNOSIS — N35919 Unspecified urethral stricture, male, unspecified site: Secondary | ICD-10-CM | POA: Diagnosis not present

## 2023-07-04 DIAGNOSIS — E785 Hyperlipidemia, unspecified: Secondary | ICD-10-CM | POA: Diagnosis not present

## 2023-07-04 DIAGNOSIS — I2510 Atherosclerotic heart disease of native coronary artery without angina pectoris: Secondary | ICD-10-CM | POA: Diagnosis not present

## 2023-07-04 DIAGNOSIS — E1165 Type 2 diabetes mellitus with hyperglycemia: Secondary | ICD-10-CM | POA: Diagnosis not present

## 2023-07-04 DIAGNOSIS — Z9181 History of falling: Secondary | ICD-10-CM | POA: Diagnosis not present

## 2023-07-04 DIAGNOSIS — Z466 Encounter for fitting and adjustment of urinary device: Secondary | ICD-10-CM | POA: Diagnosis not present

## 2023-07-04 DIAGNOSIS — I129 Hypertensive chronic kidney disease with stage 1 through stage 4 chronic kidney disease, or unspecified chronic kidney disease: Secondary | ICD-10-CM | POA: Diagnosis not present

## 2023-07-04 DIAGNOSIS — I951 Orthostatic hypotension: Secondary | ICD-10-CM | POA: Diagnosis not present

## 2023-07-04 DIAGNOSIS — Z794 Long term (current) use of insulin: Secondary | ICD-10-CM | POA: Diagnosis not present

## 2023-07-04 DIAGNOSIS — E1122 Type 2 diabetes mellitus with diabetic chronic kidney disease: Secondary | ICD-10-CM | POA: Diagnosis not present

## 2023-07-04 DIAGNOSIS — G4733 Obstructive sleep apnea (adult) (pediatric): Secondary | ICD-10-CM | POA: Diagnosis not present

## 2023-07-04 DIAGNOSIS — Z8546 Personal history of malignant neoplasm of prostate: Secondary | ICD-10-CM | POA: Diagnosis not present

## 2023-07-04 DIAGNOSIS — E1142 Type 2 diabetes mellitus with diabetic polyneuropathy: Secondary | ICD-10-CM | POA: Diagnosis not present

## 2023-07-04 DIAGNOSIS — R4189 Other symptoms and signs involving cognitive functions and awareness: Secondary | ICD-10-CM | POA: Diagnosis not present

## 2023-07-04 DIAGNOSIS — E113211 Type 2 diabetes mellitus with mild nonproliferative diabetic retinopathy with macular edema, right eye: Secondary | ICD-10-CM | POA: Diagnosis not present

## 2023-07-04 DIAGNOSIS — Z8673 Personal history of transient ischemic attack (TIA), and cerebral infarction without residual deficits: Secondary | ICD-10-CM | POA: Diagnosis not present

## 2023-07-04 DIAGNOSIS — Z435 Encounter for attention to cystostomy: Secondary | ICD-10-CM | POA: Diagnosis not present

## 2023-07-04 DIAGNOSIS — N1832 Chronic kidney disease, stage 3b: Secondary | ICD-10-CM | POA: Diagnosis not present

## 2023-07-07 ENCOUNTER — Ambulatory Visit: Payer: Medicare Other | Admitting: Family Medicine

## 2023-07-07 ENCOUNTER — Encounter: Payer: Self-pay | Admitting: Family Medicine

## 2023-07-07 VITALS — BP 136/66 | HR 65 | Temp 97.9°F | Ht 74.0 in | Wt 161.0 lb

## 2023-07-07 DIAGNOSIS — I10 Essential (primary) hypertension: Secondary | ICD-10-CM

## 2023-07-07 DIAGNOSIS — N183 Chronic kidney disease, stage 3 unspecified: Secondary | ICD-10-CM | POA: Diagnosis not present

## 2023-07-07 DIAGNOSIS — L03032 Cellulitis of left toe: Secondary | ICD-10-CM | POA: Diagnosis not present

## 2023-07-07 DIAGNOSIS — Z794 Long term (current) use of insulin: Secondary | ICD-10-CM

## 2023-07-07 DIAGNOSIS — E1140 Type 2 diabetes mellitus with diabetic neuropathy, unspecified: Secondary | ICD-10-CM

## 2023-07-07 MED ORDER — CEPHALEXIN 250 MG PO CAPS: 250.0000 mg | ORAL_CAPSULE | Freq: Three times a day (TID) | ORAL | 0 refills | Status: DC

## 2023-07-07 NOTE — Progress Notes (Signed)
   Subjective:    Patient ID: Luke Solis, male    DOB: 1931-03-28, 89 y.o.   MRN: [id number]  HPI  Follow up for hypertension and CKD  Patient with elevated blood pressure Tries to eat healthy Does not check blood pressure on a regular basis Patient has diabetes On insulin Not having any terribly low spells but did have a couple blood glucoses that were in the 80-90 range On 14 units in the evening A1c under good control Patient also with hyperlipidemia takes his medicine regular basis Left gre at toe inner nail area is sore, he is followed by foot doctor  Relates soreness and tenderness in the left inner aspect of the toenail Stage 3 chronic kidney disease, unspecified whether stage 3a or 3b CKD (HCC) - Plan: Basic Metabolic Panel  Essential hypertension, benign - Plan: Basic Metabolic Panel  Type 2 diabetes mellitus with diabetic neuropathy, with long-term current use of insulin (HCC) - Plan: Hemoglobin A1c  Cellulitis of great toe of left foot Results for orders placed or performed in visit on 05/05/23  Basic Metabolic Panel  Result Value Ref Range   Glucose 123 (H) 70 - 99 mg/dL   BUN 28 (H) 8 - 27 mg/dL   Creatinine, Ser 6.21 (H) 0.76 - 1.27 mg/dL   eGFR 45 (L) >30 QM/VHQ/4.69   BUN/Creatinine Ratio 19 10 - 24   Sodium 143 134 - 144 mmol/L   Potassium 4.1 3.5 - 5.2 mmol/L   Chloride 108 (H) 96 - 106 mmol/L   CO2 24 20 - 29 mmol/L   Calcium 9.2 8.6 - 10.2 mg/dL    Review of Systems     Objective:   Physical Exam General-in no acute distress Eyes-no discharge Lungs-respiratory rate normal, CTA CV-no murmurs,RRR Extremities skin warm dry no edema Neuro grossly normal Behavior normal, alert        Assessment & Plan:  1. Stage 3 chronic kidney disease, unspecified whether stage 3a or 3b CKD (HCC) Kidney function seems to be stable recheck labs - Basic Metabolic Panel  2. Essential hypertension, benign Blood pressure doing very well continue current  measures - Basic Metabolic Panel  3. Type 2 diabetes mellitus with diabetic neuropathy, with long-term current use of insulin (HCC) Check A1c had a low sugar earlier today of 82 recommend reducing the long-acting from 14 down to 12 - Hemoglobin A1c  4. Cellulitis of great toe of left foot Keflex 3 times daily for 7 days  Patient is already scheduled to see podiatry to have partial toenail removal His weight is stable Follow-up 4 months

## 2023-07-11 DIAGNOSIS — M79675 Pain in left toe(s): Secondary | ICD-10-CM | POA: Diagnosis not present

## 2023-07-11 DIAGNOSIS — M79672 Pain in left foot: Secondary | ICD-10-CM | POA: Diagnosis not present

## 2023-07-11 DIAGNOSIS — L03032 Cellulitis of left toe: Secondary | ICD-10-CM | POA: Diagnosis not present

## 2023-07-11 DIAGNOSIS — L600 Ingrowing nail: Secondary | ICD-10-CM | POA: Diagnosis not present

## 2023-07-21 ENCOUNTER — Telehealth: Payer: Self-pay | Admitting: Family Medicine

## 2023-07-21 DIAGNOSIS — M25551 Pain in right hip: Secondary | ICD-10-CM

## 2023-07-21 NOTE — Telephone Encounter (Signed)
Please put the patient in as a 4:10 PM appointment with Dr. Adriana Simas  Patient had a fall we will need pelvis with hips as well as lumbar spine due to fall order the staff he will do these x-rays before noon time and then he will come at 4:00 to be seen by Dr. Adriana Simas.  Potentially to collect a urine as well depending on his symptoms

## 2023-07-22 ENCOUNTER — Ambulatory Visit: Payer: Medicare Other | Admitting: Family Medicine

## 2023-07-22 ENCOUNTER — Ambulatory Visit (HOSPITAL_COMMUNITY)
Admission: RE | Admit: 2023-07-22 | Discharge: 2023-07-22 | Disposition: A | Discharge status: Home / Self Care | Payer: Medicare Other | Source: Ambulatory Visit | Attending: Family Medicine | Admitting: Family Medicine

## 2023-07-22 VITALS — BP 155/72 | HR 68 | Temp 99.1°F | Ht 74.0 in | Wt 163.2 lb

## 2023-07-22 DIAGNOSIS — N4889 Other specified disorders of penis: Secondary | ICD-10-CM | POA: Diagnosis not present

## 2023-07-22 DIAGNOSIS — M25551 Pain in right hip: Secondary | ICD-10-CM | POA: Insufficient documentation

## 2023-07-22 DIAGNOSIS — S329XXA Fracture of unspecified parts of lumbosacral spine and pelvis, initial encounter for closed fracture: Secondary | ICD-10-CM | POA: Diagnosis not present

## 2023-07-22 DIAGNOSIS — R300 Dysuria: Secondary | ICD-10-CM

## 2023-07-22 DIAGNOSIS — G8929 Other chronic pain: Secondary | ICD-10-CM | POA: Diagnosis not present

## 2023-07-22 DIAGNOSIS — I878 Other specified disorders of veins: Secondary | ICD-10-CM | POA: Diagnosis not present

## 2023-07-22 DIAGNOSIS — M5136 Other intervertebral disc degeneration, lumbar region: Secondary | ICD-10-CM | POA: Diagnosis not present

## 2023-07-22 DIAGNOSIS — M25552 Pain in left hip: Secondary | ICD-10-CM | POA: Diagnosis not present

## 2023-07-22 DIAGNOSIS — S72002A Fracture of unspecified part of neck of left femur, initial encounter for closed fracture: Secondary | ICD-10-CM | POA: Diagnosis not present

## 2023-07-22 DIAGNOSIS — M47816 Spondylosis without myelopathy or radiculopathy, lumbar region: Secondary | ICD-10-CM | POA: Diagnosis not present

## 2023-07-22 DIAGNOSIS — S72001A Fracture of unspecified part of neck of right femur, initial encounter for closed fracture: Secondary | ICD-10-CM | POA: Diagnosis not present

## 2023-07-22 DIAGNOSIS — Z043 Encounter for examination and observation following other accident: Secondary | ICD-10-CM | POA: Diagnosis not present

## 2023-07-22 LAB — POCT URINALYSIS DIP (CLINITEK)
Bilirubin, UA: NEGATIVE
Glucose, UA: NEGATIVE mg/dL
Ketones, POC UA: NEGATIVE mg/dL
Nitrite, UA: POSITIVE — AB
POC PROTEIN,UA: 100 — AB
Spec Grav, UA: >=1.03 — AB (ref 1.010–1.025)
Urobilinogen, UA: 0.2 E.U./dL
pH, UA: 5 (ref 5.0–8.0)

## 2023-07-22 MED ORDER — CEFDINIR 300 MG PO CAPS: 300.0000 mg | ORAL_CAPSULE | Freq: Two times a day (BID) | ORAL | 0 refills | Status: DC

## 2023-07-22 NOTE — Patient Instructions (Signed)
X-rays were unremarkable.  Antibiotic as prescribed.  Awaiting culture.

## 2023-07-22 NOTE — Telephone Encounter (Signed)
Spoke with DPR (wife) and advised per Dr Lorin Picket Patient had a fall we will need pelvis with hips as well as lumbar spine due to fall order the staff he will do these x-rays before noon time and then he will come at 4:00 to be seen by Dr. Adriana Simas. Potentially to collect a urine as well depending on his symptoms. DPR (wife) verbalized understanding. Appointment scheduled and xrays ordered in [facility].

## 2023-07-23 DIAGNOSIS — Z435 Encounter for attention to cystostomy: Secondary | ICD-10-CM | POA: Diagnosis not present

## 2023-07-23 DIAGNOSIS — Z466 Encounter for fitting and adjustment of urinary device: Secondary | ICD-10-CM | POA: Diagnosis not present

## 2023-07-23 DIAGNOSIS — E1122 Type 2 diabetes mellitus with diabetic chronic kidney disease: Secondary | ICD-10-CM | POA: Diagnosis not present

## 2023-07-23 DIAGNOSIS — N35919 Unspecified urethral stricture, male, unspecified site: Secondary | ICD-10-CM | POA: Diagnosis not present

## 2023-07-23 DIAGNOSIS — I129 Hypertensive chronic kidney disease with stage 1 through stage 4 chronic kidney disease, or unspecified chronic kidney disease: Secondary | ICD-10-CM | POA: Diagnosis not present

## 2023-07-23 DIAGNOSIS — R296 Repeated falls: Secondary | ICD-10-CM | POA: Diagnosis not present

## 2023-07-24 DIAGNOSIS — G8929 Other chronic pain: Secondary | ICD-10-CM | POA: Insufficient documentation

## 2023-07-24 NOTE — Assessment & Plan Note (Signed)
Chronic issue. Discussed with PCP.  Abnormal UA today. Placing on empiric antibiotic to cover for UTI while awaiting culture.

## 2023-07-24 NOTE — Progress Notes (Signed)
Subjective:  Patient ID: Luke Solis, male    DOB: November 15, 1931  Age: 89 y.o. MRN: [id number]  CC: Chief Complaint  Patient presents with   Pelvic Pain    Pt fell yesterday late afternoon, pt has a catheter and is worried about it because pelvic and penis pain has began since the fall    HPI:  89 year old male presents for evaluation of the above.  Patient suffered a fall yesterday and landed on his bottom. PCP already ordered imaging of the lumbar spine and hip/pelvis which was done earlier.   Patient reports that he has had ongoing pain of his great toe secondary to recent ingrown toenail removal. He also noted pain in the penis. This is a chronic issue for him.  Relative with the patient states that he has also had some recent respiratory symptoms. She is concerned he is getting sick.   Patient Active Problem List   Diagnosis Date Noted   Penile pain, chronic 07/24/2023   Frequent falls 10/18/2022   Suprapubic catheter (HCC) 12/25/2021   CKD (chronic kidney disease) stage 3, GFR 30-59 ml/min (HCC) 06/09/2021   Hypokalemia 06/08/2021   Poorly controlled type 2 diabetes mellitus with peripheral neuropathy (HCC) 08/11/2019   Anemia 02/28/2018   Post-traumatic male urethral stricture 12/27/2016   Prostate cancer (HCC) 10/22/2015   Radiation cystitis 11/18/2014   Type 2 diabetes mellitus with diabetic neuropathy (HCC) 07/17/2013   Hyperlipidemia 01/19/2010   OSA (obstructive sleep apnea) 01/19/2010   Essential hypertension, benign 01/19/2010   Coronary atherosclerosis of native coronary artery 01/19/2010    Social Hx   Social History   Socioeconomic History   Marital status: Married    Spouse name: Technical sales engineer   Number of children: 4   Years of education: Not on file   Highest education level: Not on file  Occupational History   Not on file  Tobacco Use   Smoking status: Never   Smokeless tobacco: Never  Vaping Use   Vaping status: Never Used  Substance and Sexual  Activity   Alcohol use: No    Alcohol/week: 0.0 standard drinks of alcohol   Drug use: No   Sexual activity: Not on file  Other Topics Concern   Not on file  Social History Narrative   [facility] (08/03/17):   Originally from [facility]. Has always lived in [place]. 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 No pets currently. No bird or mold exposure.    Married x 63 years, 2023.    3 daughters, 1 son.    Social Determinants of Health   Financial Resource Strain: Low Risk  (03/11/2023)   Overall Financial Resource Strain (CARDIA)    Difficulty of Paying Living Expenses: Not hard at all  Food Insecurity: No Food Insecurity (03/11/2023)   Hunger Vital Sign    Worried About Running Out of Food in the Last Year: Never true    Ran Out of Food in the Last Year: Never true  Transportation Needs: No Transportation Needs (03/11/2023)   PRAPARE - Administrator, Civil Service (Medical): No    Lack of Transportation (Non-Medical): No  Physical Activity: Insufficiently Active (03/11/2023)   Exercise Vital Sign    Days of Exercise per Week: 2 days    Minutes of Exercise per Session: 30 min  Stress: No Stress Concern Present (03/11/2023)   [employer] of Occupational Health - Occupational Stress Questionnaire    Feeling of Stress :  Not at all  Social Connections: Socially Integrated (03/11/2023)   Social Connection and Isolation Panel [NHANES]    Frequency of Communication with Friends and Family: More than three times a week    Frequency of Social Gatherings with Friends and Family: More than three times a week    Attends Religious Services: More than 4 times per year    Active Member of [employer] or Organizations: Yes    Attends Engineer, structural: More than 4 times per year    Marital Status: Married    Review of Systems Per HPI  Objective:  BP (!) 155/72   Pulse 68   Temp 99.1 F (37.3 C)   Ht 6\' 2"  (1.88 m)   Wt 163 lb 3.2 oz (74 kg)   SpO2 96%    BMI 20.95 kg/m      07/22/2023    3:55 PM 07/07/2023   11:37 AM 07/07/2023   11:18 AM  BP/Weight  Systolic BP 155 136 144  Diastolic BP 72 66 73  Wt. (Lbs) 163.2    BMI 20.95 kg/m2      Physical Exam Vitals and nursing note reviewed.  Constitutional:      Comments: Frail elderly male in no acute distress.  Cardiovascular:     Rate and Rhythm: Normal rate and regular rhythm.  Pulmonary:     Effort: Pulmonary effort is normal.     Breath sounds: Normal breath sounds. No wheezing or rales.  Abdominal:     General: There is no distension.     Palpations: Abdomen is soft.     Comments: Site of suprapubic catheter with no evidence of infection.     Lab Results  Component Value Date   WBC 8.8 12/21/2022   HGB 13.5 12/21/2022   HCT 39.7 12/21/2022   PLT 230 12/21/2022   GLUCOSE 190 (H) 07/07/2023   CHOL 190 03/25/2023   TRIG 134 03/25/2023   HDL 57 03/25/2023   LDLCALC 109 (H) 03/25/2023   ALT 11 03/25/2023   AST 14 03/25/2023   NA 145 (H) 07/07/2023   K 4.2 07/07/2023   CL 105 07/07/2023   CREATININE 1.32 (H) 07/07/2023   BUN 24 07/07/2023   CO2 24 07/07/2023   TSH 1.740 10/20/2020   INR 1.2 06/09/2021   HGBA1C 7.3 (H) 07/07/2023   MICROALBUR 3.18 (H) 03/12/2014     Assessment & Plan:   Problem List Items Addressed This Visit       Other   Penile pain, chronic - Primary    Chronic issue. Discussed with PCP.  Abnormal UA today. Placing on empiric antibiotic to cover for UTI while awaiting culture.      Relevant Orders   POCT URINALYSIS DIP (CLINITEK) (Completed)   Urine Culture    Meds ordered this encounter  Medications   cefdinir (OMNICEF) 300 MG capsule    Sig: Take 1 capsule (300 mg total) by mouth 2 (two) times daily.    Dispense:  14 capsule    Refill:  0    Follow-up:  Return if symptoms worsen or fail to improve.  Everlene Other DO [facility]

## 2023-07-26 ENCOUNTER — Other Ambulatory Visit: Payer: Self-pay | Admitting: Family Medicine

## 2023-07-27 LAB — URINE CULTURE

## 2023-07-27 LAB — SPECIMEN STATUS REPORT

## 2023-07-28 ENCOUNTER — Encounter: Payer: Self-pay | Admitting: Family Medicine

## 2023-07-28 NOTE — Progress Notes (Signed)
Please mail to patient thank you ?

## 2023-07-31 ENCOUNTER — Other Ambulatory Visit: Payer: Self-pay | Admitting: Family Medicine

## 2023-08-01 DIAGNOSIS — L609 Nail disorder, unspecified: Secondary | ICD-10-CM | POA: Diagnosis not present

## 2023-08-01 DIAGNOSIS — I129 Hypertensive chronic kidney disease with stage 1 through stage 4 chronic kidney disease, or unspecified chronic kidney disease: Secondary | ICD-10-CM | POA: Diagnosis not present

## 2023-08-01 DIAGNOSIS — E1140 Type 2 diabetes mellitus with diabetic neuropathy, unspecified: Secondary | ICD-10-CM | POA: Diagnosis not present

## 2023-08-01 DIAGNOSIS — M79672 Pain in left foot: Secondary | ICD-10-CM | POA: Diagnosis not present

## 2023-08-01 DIAGNOSIS — R296 Repeated falls: Secondary | ICD-10-CM | POA: Diagnosis not present

## 2023-08-01 DIAGNOSIS — Z435 Encounter for attention to cystostomy: Secondary | ICD-10-CM | POA: Diagnosis not present

## 2023-08-01 DIAGNOSIS — E1122 Type 2 diabetes mellitus with diabetic chronic kidney disease: Secondary | ICD-10-CM | POA: Diagnosis not present

## 2023-08-01 DIAGNOSIS — M79671 Pain in right foot: Secondary | ICD-10-CM | POA: Diagnosis not present

## 2023-08-01 DIAGNOSIS — Z466 Encounter for fitting and adjustment of urinary device: Secondary | ICD-10-CM | POA: Diagnosis not present

## 2023-08-01 DIAGNOSIS — N35919 Unspecified urethral stricture, male, unspecified site: Secondary | ICD-10-CM | POA: Diagnosis not present

## 2023-08-03 ENCOUNTER — Other Ambulatory Visit: Payer: Self-pay | Admitting: Family Medicine

## 2023-08-03 ENCOUNTER — Other Ambulatory Visit: Payer: Self-pay

## 2023-08-03 ENCOUNTER — Telehealth: Payer: Self-pay | Admitting: Family Medicine

## 2023-08-03 DIAGNOSIS — Z435 Encounter for attention to cystostomy: Secondary | ICD-10-CM | POA: Diagnosis not present

## 2023-08-03 DIAGNOSIS — Z8673 Personal history of transient ischemic attack (TIA), and cerebral infarction without residual deficits: Secondary | ICD-10-CM | POA: Diagnosis not present

## 2023-08-03 DIAGNOSIS — Z8546 Personal history of malignant neoplasm of prostate: Secondary | ICD-10-CM | POA: Diagnosis not present

## 2023-08-03 DIAGNOSIS — G4733 Obstructive sleep apnea (adult) (pediatric): Secondary | ICD-10-CM | POA: Diagnosis not present

## 2023-08-03 DIAGNOSIS — N35919 Unspecified urethral stricture, male, unspecified site: Secondary | ICD-10-CM | POA: Diagnosis not present

## 2023-08-03 DIAGNOSIS — E1122 Type 2 diabetes mellitus with diabetic chronic kidney disease: Secondary | ICD-10-CM | POA: Diagnosis not present

## 2023-08-03 DIAGNOSIS — E1142 Type 2 diabetes mellitus with diabetic polyneuropathy: Secondary | ICD-10-CM | POA: Diagnosis not present

## 2023-08-03 DIAGNOSIS — I129 Hypertensive chronic kidney disease with stage 1 through stage 4 chronic kidney disease, or unspecified chronic kidney disease: Secondary | ICD-10-CM | POA: Diagnosis not present

## 2023-08-03 DIAGNOSIS — R4189 Other symptoms and signs involving cognitive functions and awareness: Secondary | ICD-10-CM | POA: Diagnosis not present

## 2023-08-03 DIAGNOSIS — Z8616 Personal history of COVID-19: Secondary | ICD-10-CM | POA: Diagnosis not present

## 2023-08-03 DIAGNOSIS — I951 Orthostatic hypotension: Secondary | ICD-10-CM | POA: Diagnosis not present

## 2023-08-03 DIAGNOSIS — Z794 Long term (current) use of insulin: Secondary | ICD-10-CM | POA: Diagnosis not present

## 2023-08-03 DIAGNOSIS — I2510 Atherosclerotic heart disease of native coronary artery without angina pectoris: Secondary | ICD-10-CM | POA: Diagnosis not present

## 2023-08-03 DIAGNOSIS — Z87440 Personal history of urinary (tract) infections: Secondary | ICD-10-CM | POA: Diagnosis not present

## 2023-08-03 DIAGNOSIS — Z9181 History of falling: Secondary | ICD-10-CM | POA: Diagnosis not present

## 2023-08-03 DIAGNOSIS — E785 Hyperlipidemia, unspecified: Secondary | ICD-10-CM | POA: Diagnosis not present

## 2023-08-03 DIAGNOSIS — E1165 Type 2 diabetes mellitus with hyperglycemia: Secondary | ICD-10-CM | POA: Diagnosis not present

## 2023-08-03 DIAGNOSIS — N1832 Chronic kidney disease, stage 3b: Secondary | ICD-10-CM | POA: Diagnosis not present

## 2023-08-03 DIAGNOSIS — E113211 Type 2 diabetes mellitus with mild nonproliferative diabetic retinopathy with macular edema, right eye: Secondary | ICD-10-CM | POA: Diagnosis not present

## 2023-08-03 DIAGNOSIS — Z466 Encounter for fitting and adjustment of urinary device: Secondary | ICD-10-CM | POA: Diagnosis not present

## 2023-08-03 DIAGNOSIS — R296 Repeated falls: Secondary | ICD-10-CM | POA: Diagnosis not present

## 2023-08-03 MED ORDER — PREGABALIN 25 MG PO CAPS: ORAL_CAPSULE | ORAL | 0 refills | Status: DC

## 2023-08-03 NOTE — Telephone Encounter (Signed)
Nurses Please call patient and/or his wife. Please verify their use of pregabalin The prescription is written as 3 times a day but the pharmacy is filling it twice a day Please clarify is he taking it twice daily or 3 times daily thank you  Obviously please let me know thank you

## 2023-08-04 ENCOUNTER — Other Ambulatory Visit: Payer: Self-pay | Admitting: Family Medicine

## 2023-08-04 MED ORDER — PREGABALIN 25 MG PO CAPS: ORAL_CAPSULE | ORAL | 5 refills | Status: DC

## 2023-08-04 NOTE — Telephone Encounter (Signed)
Corrected prescription sent in as requested

## 2023-08-09 DIAGNOSIS — H353211 Exudative age-related macular degeneration, right eye, with active choroidal neovascularization: Secondary | ICD-10-CM | POA: Diagnosis not present

## 2023-08-17 ENCOUNTER — Ambulatory Visit (HOSPITAL_COMMUNITY)
Admission: RE | Admit: 2023-08-17 | Discharge: 2023-08-17 | Disposition: A | Discharge status: Home / Self Care | Payer: Medicare Other | Source: Ambulatory Visit | Attending: Family Medicine | Admitting: Family Medicine

## 2023-08-17 ENCOUNTER — Ambulatory Visit (INDEPENDENT_AMBULATORY_CARE_PROVIDER_SITE_OTHER): Payer: Medicare Other | Admitting: Family Medicine

## 2023-08-17 ENCOUNTER — Encounter: Payer: Self-pay | Admitting: Family Medicine

## 2023-08-17 VITALS — BP 136/72 | HR 72 | Temp 97.4°F | Wt 162.4 lb

## 2023-08-17 DIAGNOSIS — R051 Acute cough: Secondary | ICD-10-CM | POA: Insufficient documentation

## 2023-08-17 DIAGNOSIS — R059 Cough, unspecified: Secondary | ICD-10-CM | POA: Diagnosis not present

## 2023-08-17 MED ORDER — BENZONATATE 200 MG PO CAPS: 200.0000 mg | ORAL_CAPSULE | Freq: Three times a day (TID) | ORAL | 0 refills | Status: DC | PRN

## 2023-08-17 NOTE — Patient Instructions (Signed)
We will call with COVID results and chest xray results.  Tessalon sent in.  Take care  Dr. Adriana Simas

## 2023-08-17 NOTE — Progress Notes (Signed)
Subjective:  Patient ID: Luke Solis, male    DOB: 03-05-1931  Age: 89 y.o. MRN: [id number]  CC: Cough   HPI:  89 year old male with an extensive past medical history presents for evaluation of the above.  Patient's wife states that he has had an occasional cough over the past 2 weeks.  Patient states that cough has been more troublesome since the beginning of this week.  Frequent cough and associated congestion.  Has had headache and fatigue as well.  He has not tested for COVID-19.  He has recently been around other individuals who have come down sick.  Wife now has symptoms as well.  Developed sore throat today.  He has been using Tylenol and Tessalon Perles.  No fever.  Patient Active Problem List   Diagnosis Date Noted   Acute cough 08/17/2023   Penile pain, chronic 07/24/2023   Frequent falls 10/18/2022   Suprapubic catheter (HCC) 12/25/2021   CKD (chronic kidney disease) stage 3, GFR 30-59 ml/min (HCC) 06/09/2021   Hypokalemia 06/08/2021   Poorly controlled type 2 diabetes mellitus with peripheral neuropathy (HCC) 08/11/2019   Anemia 02/28/2018   Post-traumatic male urethral stricture 12/27/2016   Prostate cancer (HCC) 10/22/2015   Radiation cystitis 11/18/2014   Type 2 diabetes mellitus with diabetic neuropathy (HCC) 07/17/2013   Hyperlipidemia 01/19/2010   OSA (obstructive sleep apnea) 01/19/2010   Essential hypertension, benign 01/19/2010   Coronary atherosclerosis of native coronary artery 01/19/2010    Social Hx   Social History   Socioeconomic History   Marital status: Married    Spouse name: Technical sales engineer   Number of children: 4   Years of education: Not on file   Highest education level: Not on file  Occupational History   Not on file  Tobacco Use   Smoking status: Never   Smokeless tobacco: Never  Vaping Use   Vaping status: Never Used  Substance and Sexual Activity   Alcohol use: No    Alcohol/week: 0.0 standard drinks of alcohol   Drug use: No    Sexual activity: Not on file  Other Topics Concern   Not on file  Social History Narrative   [facility] (08/03/17):   Originally from [facility]. Has always lived in [place]. 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 No pets currently. No bird or mold exposure.    Married x 63 years, 2023.    3 daughters, 1 son.    Social Determinants of Health   Financial Resource Strain: Low Risk  (03/11/2023)   Overall Financial Resource Strain (CARDIA)    Difficulty of Paying Living Expenses: Not hard at all  Food Insecurity: No Food Insecurity (03/11/2023)   Hunger Vital Sign    Worried About Running Out of Food in the Last Year: Never true    Ran Out of Food in the Last Year: Never true  Transportation Needs: No Transportation Needs (03/11/2023)   PRAPARE - Administrator, Civil Service (Medical): No    Lack of Transportation (Non-Medical): No  Physical Activity: Insufficiently Active (03/11/2023)   Exercise Vital Sign    Days of Exercise per Week: 2 days    Minutes of Exercise per Session: 30 min  Stress: No Stress Concern Present (03/11/2023)   [employer] of Occupational Health - Occupational Stress Questionnaire    Feeling of Stress : Not at all  Social Connections: Socially Integrated (03/11/2023)   Social Connection and Isolation Panel [NHANES]    Frequency  of Communication with Friends and Family: More than three times a week    Frequency of Social Gatherings with Friends and Family: More than three times a week    Attends Religious Services: More than 4 times per year    Active Member of [employer] or Organizations: Yes    Attends Engineer, structural: More than 4 times per year    Marital Status: Married    Review of Systems Per HPI  Objective:  BP 136/72   Pulse 72   Temp (!) 97.4 F (36.3 C) (Oral)   Wt 162 lb 6.4 oz (73.7 kg)   SpO2 97%   BMI 20.85 kg/m      08/17/2023   11:00 AM 07/22/2023    3:55 PM 07/07/2023   11:37 AM  BP/Weight   Systolic BP 136 155 136  Diastolic BP 72 72 66  Wt. (Lbs) 162.4 163.2   BMI 20.85 kg/m2 20.95 kg/m2     Physical Exam Vitals and nursing note reviewed.  Constitutional:      General: He is not in acute distress.    Appearance: Normal appearance. He is not ill-appearing.  HENT:     Head: Normocephalic and atraumatic.     Mouth/Throat:     Pharynx: Oropharynx is clear.  Eyes:     General:        Right eye: No discharge.        Left eye: No discharge.     Conjunctiva/sclera: Conjunctivae normal.  Cardiovascular:     Rate and Rhythm: Normal rate and regular rhythm.  Pulmonary:     Effort: Pulmonary effort is normal.     Breath sounds: Normal breath sounds. No wheezing or rales.  Neurological:     Mental Status: He is alert.     Lab Results  Component Value Date   WBC 8.8 12/21/2022   HGB 13.5 12/21/2022   HCT 39.7 12/21/2022   PLT 230 12/21/2022   GLUCOSE 190 (H) 07/07/2023   CHOL 190 03/25/2023   TRIG 134 03/25/2023   HDL 57 03/25/2023   LDLCALC 109 (H) 03/25/2023   ALT 11 03/25/2023   AST 14 03/25/2023   NA 145 (H) 07/07/2023   K 4.2 07/07/2023   CL 105 07/07/2023   CREATININE 1.32 (H) 07/07/2023   BUN 24 07/07/2023   CO2 24 07/07/2023   TSH 1.740 10/20/2020   INR 1.2 06/09/2021   HGBA1C 7.3 (H) 07/07/2023   MICROALBUR 3.18 (H) 03/12/2014     Assessment & Plan:   Problem List Items Addressed This Visit       Other   Acute cough - Primary    Patient with cough and other associated respiratory symptoms.  Concern for COVID-19.  Given age and comorbidity, sending for chest x-ray.  Chest x-ray was independently reviewed by me.  Interpretation: Normal chest x-ray.  Tessalon Perles for cough.  No antibiotic therapy at this time.  Supportive care.  Awaiting COVID-19 test results.      Relevant Orders   DG Chest 2 View (Completed)   Novel Coronavirus, NAA ([facility])    Meds ordered this encounter  Medications   benzonatate (TESSALON) 200 MG capsule     Sig: Take 1 capsule (200 mg total) by mouth 3 (three) times daily as needed.    Dispense:  30 capsule    Refill:  0    Follow-up:  Return if symptoms worsen or fail to improve.  Everlene Other DO [facility]

## 2023-08-17 NOTE — Assessment & Plan Note (Signed)
Patient with cough and other associated respiratory symptoms.  Concern for COVID-19.  Given age and comorbidity, sending for chest x-ray.  Chest x-ray was independently reviewed by me.  Interpretation: Normal chest x-ray.  Tessalon Perles for cough.  No antibiotic therapy at this time.  Supportive care.  Awaiting COVID-19 test results.

## 2023-08-18 DIAGNOSIS — I2510 Atherosclerotic heart disease of native coronary artery without angina pectoris: Secondary | ICD-10-CM | POA: Diagnosis not present

## 2023-08-18 DIAGNOSIS — Z435 Encounter for attention to cystostomy: Secondary | ICD-10-CM | POA: Diagnosis not present

## 2023-08-18 DIAGNOSIS — E1122 Type 2 diabetes mellitus with diabetic chronic kidney disease: Secondary | ICD-10-CM | POA: Diagnosis not present

## 2023-08-18 DIAGNOSIS — Z466 Encounter for fitting and adjustment of urinary device: Secondary | ICD-10-CM | POA: Diagnosis not present

## 2023-08-18 DIAGNOSIS — R296 Repeated falls: Secondary | ICD-10-CM | POA: Diagnosis not present

## 2023-08-18 DIAGNOSIS — I951 Orthostatic hypotension: Secondary | ICD-10-CM | POA: Diagnosis not present

## 2023-08-18 DIAGNOSIS — E1165 Type 2 diabetes mellitus with hyperglycemia: Secondary | ICD-10-CM | POA: Diagnosis not present

## 2023-08-18 DIAGNOSIS — E113211 Type 2 diabetes mellitus with mild nonproliferative diabetic retinopathy with macular edema, right eye: Secondary | ICD-10-CM | POA: Diagnosis not present

## 2023-08-18 DIAGNOSIS — I129 Hypertensive chronic kidney disease with stage 1 through stage 4 chronic kidney disease, or unspecified chronic kidney disease: Secondary | ICD-10-CM | POA: Diagnosis not present

## 2023-08-18 DIAGNOSIS — N35919 Unspecified urethral stricture, male, unspecified site: Secondary | ICD-10-CM | POA: Diagnosis not present

## 2023-08-18 DIAGNOSIS — E1142 Type 2 diabetes mellitus with diabetic polyneuropathy: Secondary | ICD-10-CM | POA: Diagnosis not present

## 2023-08-18 DIAGNOSIS — N1832 Chronic kidney disease, stage 3b: Secondary | ICD-10-CM | POA: Diagnosis not present

## 2023-08-19 ENCOUNTER — Telehealth: Payer: Self-pay

## 2023-08-19 ENCOUNTER — Other Ambulatory Visit: Payer: Self-pay | Admitting: Family Medicine

## 2023-08-19 ENCOUNTER — Telehealth: Payer: Self-pay | Admitting: Family Medicine

## 2023-08-19 LAB — NOVEL CORONAVIRUS, NAA: SARS-CoV-2, NAA: DETECTED — AB

## 2023-08-19 MED ORDER — NIRMATRELVIR/RITONAVIR (PAXLOVID) TABLET (RENAL DOSING): 2.0000 | ORAL_TABLET | Freq: Two times a day (BID) | ORAL | 0 refills | Status: AC

## 2023-08-19 NOTE — Telephone Encounter (Signed)
Family called office Paxlovid renal dose rx No statin and 1/2 dose of amlodipine Plz notify pt Rx sent to [facility] dr Adriana Simas

## 2023-08-19 NOTE — Telephone Encounter (Signed)
Spoke with patient and informed of his covid results- cough and chest congestion - minimal phlegm , white clear in color, no fever or headache

## 2023-08-19 NOTE — Telephone Encounter (Signed)
Called patient and DPR Luke Solis took message. DPR advised per Dr Lorin Picket Family called office Paxlovid renal dose rx No statin and 1/2 dose of amlodipine Plz notify pt Rx sent to [facility] dr Adriana Simas DPR verbalized understanding.

## 2023-08-19 NOTE — Telephone Encounter (Signed)
Spoke with Dr. Lorin Picket. This has been addressed and medication sent in.

## 2023-08-29 ENCOUNTER — Other Ambulatory Visit: Payer: Self-pay | Admitting: Family Medicine

## 2023-08-29 DIAGNOSIS — I129 Hypertensive chronic kidney disease with stage 1 through stage 4 chronic kidney disease, or unspecified chronic kidney disease: Secondary | ICD-10-CM | POA: Diagnosis not present

## 2023-08-29 DIAGNOSIS — Z435 Encounter for attention to cystostomy: Secondary | ICD-10-CM | POA: Diagnosis not present

## 2023-08-29 DIAGNOSIS — N35919 Unspecified urethral stricture, male, unspecified site: Secondary | ICD-10-CM | POA: Diagnosis not present

## 2023-08-29 DIAGNOSIS — Z466 Encounter for fitting and adjustment of urinary device: Secondary | ICD-10-CM | POA: Diagnosis not present

## 2023-08-29 DIAGNOSIS — E1122 Type 2 diabetes mellitus with diabetic chronic kidney disease: Secondary | ICD-10-CM | POA: Diagnosis not present

## 2023-08-29 DIAGNOSIS — R296 Repeated falls: Secondary | ICD-10-CM | POA: Diagnosis not present

## 2023-08-30 ENCOUNTER — Encounter: Payer: Self-pay | Admitting: Family Medicine

## 2023-08-30 ENCOUNTER — Ambulatory Visit: Payer: Medicare Other | Admitting: Family Medicine

## 2023-08-30 VITALS — BP 136/76 | HR 70 | Temp 97.7°F | Ht 74.0 in | Wt 162.0 lb

## 2023-08-30 DIAGNOSIS — R159 Full incontinence of feces: Secondary | ICD-10-CM

## 2023-08-30 DIAGNOSIS — R5383 Other fatigue: Secondary | ICD-10-CM | POA: Diagnosis not present

## 2023-08-30 DIAGNOSIS — I951 Orthostatic hypotension: Secondary | ICD-10-CM | POA: Diagnosis not present

## 2023-08-30 DIAGNOSIS — U071 COVID-19: Secondary | ICD-10-CM

## 2023-08-30 MED ORDER — AMLODIPINE BESYLATE 2.5 MG PO TABS: ORAL_TABLET | ORAL | 6 refills | Status: DC

## 2023-08-30 NOTE — Progress Notes (Signed)
Subjective:    Patient ID: Luke Solis, male    DOB: March 27, 1931, 89 y.o.   MRN: [id number]  HPI Patient has indwelling suprapubic catheter Has a few different episodes where he unfortunately loses control of his bowels before he can get to the bathroom This is causing him some distress In addition to this it is limiting his ability to get around  He recently had COVID infection denies any constipation states a lot of post-COVID fatigue no more coughing no fever chills just feels fatigued tired rundown   Review of Systems     Objective:   Physical Exam  General-in no acute distress Eyes-no discharge Lungs-respiratory rate normal, CTA CV-no murmurs,RRR Extremities skin warm dry no edema Neuro grossly normal Behavior normal, alert       Assessment & Plan:   1. Encopresis I think trying to schedule some bowel movement sitting on the toilet etc. would be good Also keeping a diary of the bowel movements and any accidents and bring this with him on the next visit  2. Orthostatic hypotension Reduce the amlodipine to 2.5 mg 1 daily instead of 5 mg follow-up in several weeks to recheck blood pressure  3. COVID-19 virus infection Viral process resolving secondary to viral syndromes are abating but he does have significant fatigue tiredness mainly this is a post COVID fatigue  4. Other fatigue Related to COVID, if his fatigue persists we will do additional lab work

## 2023-09-02 DIAGNOSIS — N1832 Chronic kidney disease, stage 3b: Secondary | ICD-10-CM | POA: Diagnosis not present

## 2023-09-02 DIAGNOSIS — E1142 Type 2 diabetes mellitus with diabetic polyneuropathy: Secondary | ICD-10-CM | POA: Diagnosis not present

## 2023-09-02 DIAGNOSIS — Z8673 Personal history of transient ischemic attack (TIA), and cerebral infarction without residual deficits: Secondary | ICD-10-CM | POA: Diagnosis not present

## 2023-09-02 DIAGNOSIS — I129 Hypertensive chronic kidney disease with stage 1 through stage 4 chronic kidney disease, or unspecified chronic kidney disease: Secondary | ICD-10-CM | POA: Diagnosis not present

## 2023-09-02 DIAGNOSIS — R296 Repeated falls: Secondary | ICD-10-CM | POA: Diagnosis not present

## 2023-09-02 DIAGNOSIS — R4189 Other symptoms and signs involving cognitive functions and awareness: Secondary | ICD-10-CM | POA: Diagnosis not present

## 2023-09-02 DIAGNOSIS — Z9181 History of falling: Secondary | ICD-10-CM | POA: Diagnosis not present

## 2023-09-02 DIAGNOSIS — Z8616 Personal history of COVID-19: Secondary | ICD-10-CM | POA: Diagnosis not present

## 2023-09-02 DIAGNOSIS — N35919 Unspecified urethral stricture, male, unspecified site: Secondary | ICD-10-CM | POA: Diagnosis not present

## 2023-09-02 DIAGNOSIS — Z435 Encounter for attention to cystostomy: Secondary | ICD-10-CM | POA: Diagnosis not present

## 2023-09-02 DIAGNOSIS — Z794 Long term (current) use of insulin: Secondary | ICD-10-CM | POA: Diagnosis not present

## 2023-09-02 DIAGNOSIS — Z87440 Personal history of urinary (tract) infections: Secondary | ICD-10-CM | POA: Diagnosis not present

## 2023-09-02 DIAGNOSIS — E1165 Type 2 diabetes mellitus with hyperglycemia: Secondary | ICD-10-CM | POA: Diagnosis not present

## 2023-09-02 DIAGNOSIS — I2510 Atherosclerotic heart disease of native coronary artery without angina pectoris: Secondary | ICD-10-CM | POA: Diagnosis not present

## 2023-09-02 DIAGNOSIS — G4733 Obstructive sleep apnea (adult) (pediatric): Secondary | ICD-10-CM | POA: Diagnosis not present

## 2023-09-02 DIAGNOSIS — I951 Orthostatic hypotension: Secondary | ICD-10-CM | POA: Diagnosis not present

## 2023-09-02 DIAGNOSIS — E785 Hyperlipidemia, unspecified: Secondary | ICD-10-CM | POA: Diagnosis not present

## 2023-09-02 DIAGNOSIS — Z8546 Personal history of malignant neoplasm of prostate: Secondary | ICD-10-CM | POA: Diagnosis not present

## 2023-09-02 DIAGNOSIS — E1122 Type 2 diabetes mellitus with diabetic chronic kidney disease: Secondary | ICD-10-CM | POA: Diagnosis not present

## 2023-09-02 DIAGNOSIS — Z466 Encounter for fitting and adjustment of urinary device: Secondary | ICD-10-CM | POA: Diagnosis not present

## 2023-09-02 DIAGNOSIS — E113211 Type 2 diabetes mellitus with mild nonproliferative diabetic retinopathy with macular edema, right eye: Secondary | ICD-10-CM | POA: Diagnosis not present

## 2023-09-13 DIAGNOSIS — N3281 Overactive bladder: Secondary | ICD-10-CM | POA: Diagnosis not present

## 2023-09-14 ENCOUNTER — Telehealth: Payer: Self-pay | Admitting: Family Medicine

## 2023-09-14 ENCOUNTER — Other Ambulatory Visit: Payer: Self-pay

## 2023-09-14 MED ORDER — AMLODIPINE BESYLATE 5 MG PO TABS: 5.0000 mg | ORAL_TABLET | Freq: Every day | ORAL | 5 refills | Status: DC

## 2023-09-14 NOTE — Telephone Encounter (Signed)
Spoke with patient's wife and pharmacy to inform per amlodipine increase notification.

## 2023-09-14 NOTE — Telephone Encounter (Signed)
Nurses Daughter connected and stating that her dad's blood pressure was markedly elevated at urology visit on 3 separate blood pressure checks at the urologist office Readings 188/61, 198/81, 168/86  I recommend increasing amlodipine new dose 5 mg, #30, 1 daily, 5 refills Notify pharmacy and patient Cancel 2.5 mg Patient has follow-up visit toward the end of October follow-up sooner if problems

## 2023-09-20 ENCOUNTER — Ambulatory Visit: Payer: Medicare Other

## 2023-09-20 DIAGNOSIS — Z23 Encounter for immunization: Secondary | ICD-10-CM | POA: Diagnosis not present

## 2023-09-21 DIAGNOSIS — I10 Essential (primary) hypertension: Secondary | ICD-10-CM | POA: Diagnosis not present

## 2023-09-21 DIAGNOSIS — E1122 Type 2 diabetes mellitus with diabetic chronic kidney disease: Secondary | ICD-10-CM | POA: Diagnosis not present

## 2023-09-21 DIAGNOSIS — R809 Proteinuria, unspecified: Secondary | ICD-10-CM | POA: Diagnosis not present

## 2023-09-21 DIAGNOSIS — N1832 Chronic kidney disease, stage 3b: Secondary | ICD-10-CM | POA: Diagnosis not present

## 2023-09-23 ENCOUNTER — Other Ambulatory Visit: Payer: Self-pay | Admitting: Family Medicine

## 2023-09-23 DIAGNOSIS — E1140 Type 2 diabetes mellitus with diabetic neuropathy, unspecified: Secondary | ICD-10-CM

## 2023-09-26 ENCOUNTER — Other Ambulatory Visit: Payer: Self-pay | Admitting: Family Medicine

## 2023-09-30 DIAGNOSIS — I129 Hypertensive chronic kidney disease with stage 1 through stage 4 chronic kidney disease, or unspecified chronic kidney disease: Secondary | ICD-10-CM | POA: Diagnosis not present

## 2023-09-30 DIAGNOSIS — R296 Repeated falls: Secondary | ICD-10-CM | POA: Diagnosis not present

## 2023-09-30 DIAGNOSIS — N35919 Unspecified urethral stricture, male, unspecified site: Secondary | ICD-10-CM | POA: Diagnosis not present

## 2023-09-30 DIAGNOSIS — E1122 Type 2 diabetes mellitus with diabetic chronic kidney disease: Secondary | ICD-10-CM | POA: Diagnosis not present

## 2023-09-30 DIAGNOSIS — Z435 Encounter for attention to cystostomy: Secondary | ICD-10-CM | POA: Diagnosis not present

## 2023-09-30 DIAGNOSIS — Z466 Encounter for fitting and adjustment of urinary device: Secondary | ICD-10-CM | POA: Diagnosis not present

## 2023-10-02 DIAGNOSIS — Z87440 Personal history of urinary (tract) infections: Secondary | ICD-10-CM | POA: Diagnosis not present

## 2023-10-02 DIAGNOSIS — Z8546 Personal history of malignant neoplasm of prostate: Secondary | ICD-10-CM | POA: Diagnosis not present

## 2023-10-02 DIAGNOSIS — E1165 Type 2 diabetes mellitus with hyperglycemia: Secondary | ICD-10-CM | POA: Diagnosis not present

## 2023-10-02 DIAGNOSIS — E785 Hyperlipidemia, unspecified: Secondary | ICD-10-CM | POA: Diagnosis not present

## 2023-10-02 DIAGNOSIS — N35919 Unspecified urethral stricture, male, unspecified site: Secondary | ICD-10-CM | POA: Diagnosis not present

## 2023-10-02 DIAGNOSIS — Z9181 History of falling: Secondary | ICD-10-CM | POA: Diagnosis not present

## 2023-10-02 DIAGNOSIS — N1832 Chronic kidney disease, stage 3b: Secondary | ICD-10-CM | POA: Diagnosis not present

## 2023-10-02 DIAGNOSIS — I2510 Atherosclerotic heart disease of native coronary artery without angina pectoris: Secondary | ICD-10-CM | POA: Diagnosis not present

## 2023-10-02 DIAGNOSIS — Z8616 Personal history of COVID-19: Secondary | ICD-10-CM | POA: Diagnosis not present

## 2023-10-02 DIAGNOSIS — Z8673 Personal history of transient ischemic attack (TIA), and cerebral infarction without residual deficits: Secondary | ICD-10-CM | POA: Diagnosis not present

## 2023-10-02 DIAGNOSIS — I951 Orthostatic hypotension: Secondary | ICD-10-CM | POA: Diagnosis not present

## 2023-10-02 DIAGNOSIS — R4189 Other symptoms and signs involving cognitive functions and awareness: Secondary | ICD-10-CM | POA: Diagnosis not present

## 2023-10-02 DIAGNOSIS — R296 Repeated falls: Secondary | ICD-10-CM | POA: Diagnosis not present

## 2023-10-02 DIAGNOSIS — Z435 Encounter for attention to cystostomy: Secondary | ICD-10-CM | POA: Diagnosis not present

## 2023-10-02 DIAGNOSIS — I129 Hypertensive chronic kidney disease with stage 1 through stage 4 chronic kidney disease, or unspecified chronic kidney disease: Secondary | ICD-10-CM | POA: Diagnosis not present

## 2023-10-02 DIAGNOSIS — E1122 Type 2 diabetes mellitus with diabetic chronic kidney disease: Secondary | ICD-10-CM | POA: Diagnosis not present

## 2023-10-02 DIAGNOSIS — E1142 Type 2 diabetes mellitus with diabetic polyneuropathy: Secondary | ICD-10-CM | POA: Diagnosis not present

## 2023-10-02 DIAGNOSIS — Z794 Long term (current) use of insulin: Secondary | ICD-10-CM | POA: Diagnosis not present

## 2023-10-02 DIAGNOSIS — G4733 Obstructive sleep apnea (adult) (pediatric): Secondary | ICD-10-CM | POA: Diagnosis not present

## 2023-10-02 DIAGNOSIS — Z466 Encounter for fitting and adjustment of urinary device: Secondary | ICD-10-CM | POA: Diagnosis not present

## 2023-10-02 DIAGNOSIS — E113211 Type 2 diabetes mellitus with mild nonproliferative diabetic retinopathy with macular edema, right eye: Secondary | ICD-10-CM | POA: Diagnosis not present

## 2023-10-03 DIAGNOSIS — H40013 Open angle with borderline findings, low risk, bilateral: Secondary | ICD-10-CM | POA: Diagnosis not present

## 2023-10-03 DIAGNOSIS — H02834 Dermatochalasis of left upper eyelid: Secondary | ICD-10-CM | POA: Diagnosis not present

## 2023-10-03 DIAGNOSIS — H43813 Vitreous degeneration, bilateral: Secondary | ICD-10-CM | POA: Diagnosis not present

## 2023-10-03 DIAGNOSIS — E119 Type 2 diabetes mellitus without complications: Secondary | ICD-10-CM | POA: Diagnosis not present

## 2023-10-03 DIAGNOSIS — H02883 Meibomian gland dysfunction of right eye, unspecified eyelid: Secondary | ICD-10-CM | POA: Diagnosis not present

## 2023-10-03 DIAGNOSIS — H353211 Exudative age-related macular degeneration, right eye, with active choroidal neovascularization: Secondary | ICD-10-CM | POA: Diagnosis not present

## 2023-10-03 DIAGNOSIS — H3561 Retinal hemorrhage, right eye: Secondary | ICD-10-CM | POA: Diagnosis not present

## 2023-10-03 DIAGNOSIS — H02886 Meibomian gland dysfunction of left eye, unspecified eyelid: Secondary | ICD-10-CM | POA: Diagnosis not present

## 2023-10-03 DIAGNOSIS — H02831 Dermatochalasis of right upper eyelid: Secondary | ICD-10-CM | POA: Diagnosis not present

## 2023-10-03 DIAGNOSIS — Z961 Presence of intraocular lens: Secondary | ICD-10-CM | POA: Diagnosis not present

## 2023-10-03 LAB — HM DIABETES EYE EXAM

## 2023-10-06 ENCOUNTER — Ambulatory Visit: Payer: Medicare Other | Admitting: Family Medicine

## 2023-10-15 DIAGNOSIS — E1122 Type 2 diabetes mellitus with diabetic chronic kidney disease: Secondary | ICD-10-CM

## 2023-10-15 DIAGNOSIS — R296 Repeated falls: Secondary | ICD-10-CM

## 2023-10-15 DIAGNOSIS — E113211 Type 2 diabetes mellitus with mild nonproliferative diabetic retinopathy with macular edema, right eye: Secondary | ICD-10-CM

## 2023-10-15 DIAGNOSIS — Z466 Encounter for fitting and adjustment of urinary device: Secondary | ICD-10-CM

## 2023-10-15 DIAGNOSIS — I2510 Atherosclerotic heart disease of native coronary artery without angina pectoris: Secondary | ICD-10-CM

## 2023-10-15 DIAGNOSIS — Z435 Encounter for attention to cystostomy: Secondary | ICD-10-CM

## 2023-10-15 DIAGNOSIS — E1165 Type 2 diabetes mellitus with hyperglycemia: Secondary | ICD-10-CM

## 2023-10-15 DIAGNOSIS — N35919 Unspecified urethral stricture, male, unspecified site: Secondary | ICD-10-CM

## 2023-10-15 DIAGNOSIS — N1832 Chronic kidney disease, stage 3b: Secondary | ICD-10-CM

## 2023-10-15 DIAGNOSIS — I951 Orthostatic hypotension: Secondary | ICD-10-CM

## 2023-10-15 DIAGNOSIS — I129 Hypertensive chronic kidney disease with stage 1 through stage 4 chronic kidney disease, or unspecified chronic kidney disease: Secondary | ICD-10-CM

## 2023-10-15 DIAGNOSIS — E1142 Type 2 diabetes mellitus with diabetic polyneuropathy: Secondary | ICD-10-CM

## 2023-10-19 ENCOUNTER — Telehealth: Payer: Self-pay | Admitting: Family Medicine

## 2023-10-19 ENCOUNTER — Other Ambulatory Visit: Payer: Self-pay | Admitting: Family Medicine

## 2023-10-19 ENCOUNTER — Telehealth: Payer: Self-pay | Admitting: *Deleted

## 2023-10-19 DIAGNOSIS — I10 Essential (primary) hypertension: Secondary | ICD-10-CM

## 2023-10-19 DIAGNOSIS — E785 Hyperlipidemia, unspecified: Secondary | ICD-10-CM

## 2023-10-19 DIAGNOSIS — N183 Chronic kidney disease, stage 3 unspecified: Secondary | ICD-10-CM

## 2023-10-19 DIAGNOSIS — Z79899 Other long term (current) drug therapy: Secondary | ICD-10-CM

## 2023-10-19 DIAGNOSIS — E1140 Type 2 diabetes mellitus with diabetic neuropathy, unspecified: Secondary | ICD-10-CM

## 2023-10-19 NOTE — Telephone Encounter (Signed)
Source  [name] "Eddie" (Patient)   Subject  Leandro Reasoner "Eddie" (Patient)   Topic  General - Other    Communication  Reason for CRM: Pt's wife, Greta Doom, calling to see if pt needs to have labs done before appt on Monday. Checked and last labs were done on 07/2023. Called clinical and front desk stated she would send to provider and see if he wants pt to have labs done and if so, it would have to be after appt. Advised this info to pt's wife and she was frustrated & stated, " this is unnecessary". Please contact her back and let her know if pt will need to have labs done again. CB # [phone].

## 2023-10-19 NOTE — Telephone Encounter (Signed)
Will patient need labs done before appointment on 11/18 . Please advise

## 2023-10-19 NOTE — Telephone Encounter (Signed)
Source  [name] "Luke Solis" (Patient)   Subject  [name], [name] "Luke Solis" (Patient)   Topic  Clinical - Lab/Test Results    Communication  Reason for CRM: Patient is scheduled to come in on Monday 10/24/2023 and wanted to confirm if he would require any labs prior to the appointment.

## 2023-10-19 NOTE — Telephone Encounter (Signed)
+   Nurses-A1c, metabolic 7, lipid, liver, urine ACR Diabetes hypertension hyperlipidemia

## 2023-10-20 DIAGNOSIS — E1140 Type 2 diabetes mellitus with diabetic neuropathy, unspecified: Secondary | ICD-10-CM | POA: Diagnosis not present

## 2023-10-20 DIAGNOSIS — M79672 Pain in left foot: Secondary | ICD-10-CM | POA: Diagnosis not present

## 2023-10-20 DIAGNOSIS — L609 Nail disorder, unspecified: Secondary | ICD-10-CM | POA: Diagnosis not present

## 2023-10-20 DIAGNOSIS — M79671 Pain in right foot: Secondary | ICD-10-CM | POA: Diagnosis not present

## 2023-10-20 NOTE — Telephone Encounter (Signed)
 Called and spoke to patient that labs have been ordered.

## 2023-10-20 NOTE — Telephone Encounter (Signed)
Called patient and advised per Dr Lorin Picket,  Lipid, metabolic 7 Hyperlipidemia, chronic kidney disease stage IIIb   Should be noted that A1c has not been ordered because it was just recently completed within the past 3 months  Labs have been ordered, you can stop by the lab anytime. Patient verbalized understanding.

## 2023-10-20 NOTE — Telephone Encounter (Signed)
Lipid, metabolic 7 Hyperlipidemia, chronic kidney disease stage IIIb  Should be noted that A1c has not been ordered because it was just recently completed within the past 3 months

## 2023-10-21 LAB — HEPATIC FUNCTION PANEL
ALT: 9 [IU]/L (ref 0–44)
AST: 16 [IU]/L (ref 0–40)
Albumin: 4.2 g/dL (ref 3.6–4.6)
Alkaline Phosphatase: 68 [IU]/L (ref 44–121)
Bilirubin Total: 0.2 mg/dL (ref 0.0–1.2)
Bilirubin, Direct: 0.1 mg/dL (ref 0.00–0.40)
Total Protein: 7.2 g/dL (ref 6.0–8.5)

## 2023-10-21 LAB — BASIC METABOLIC PANEL
BUN/Creatinine Ratio: 21 (ref 10–24)
BUN: 28 mg/dL (ref 10–36)
CO2: 24 mmol/L (ref 20–29)
Calcium: 9.6 mg/dL (ref 8.6–10.2)
Chloride: 101 mmol/L (ref 96–106)
Creatinine, Ser: 1.32 mg/dL — ABNORMAL HIGH (ref 0.76–1.27)
Glucose: 254 mg/dL — ABNORMAL HIGH (ref 70–99)
Potassium: 4.2 mmol/L (ref 3.5–5.2)
Sodium: 143 mmol/L (ref 134–144)
eGFR: 51 mL/min/{1.73_m2} — ABNORMAL LOW (ref >59)

## 2023-10-21 LAB — LIPID PANEL
Chol/HDL Ratio: 3.7 ratio (ref 0.0–5.0)
Cholesterol, Total: 194 mg/dL (ref 100–199)
HDL: 52 mg/dL (ref >39)
LDL Chol Calc (NIH): 109 mg/dL — ABNORMAL HIGH (ref 0–99)
Triglycerides: 189 mg/dL — ABNORMAL HIGH (ref 0–149)
VLDL Cholesterol Cal: 33 mg/dL (ref 5–40)

## 2023-10-21 LAB — HEMOGLOBIN A1C
Est. average glucose Bld gHb Est-mCnc: 163 mg/dL
Hgb A1c MFr Bld: 7.3 % — ABNORMAL HIGH (ref 4.8–5.6)

## 2023-10-21 LAB — MICROALBUMIN / CREATININE URINE RATIO
Creatinine, Urine: 116.3 mg/dL
Microalb/Creat Ratio: 226 mg/g{creat} — ABNORMAL HIGH (ref 0–29)
Microalbumin, Urine: 263.2 ug/mL

## 2023-10-24 ENCOUNTER — Ambulatory Visit (INDEPENDENT_AMBULATORY_CARE_PROVIDER_SITE_OTHER): Payer: Medicare Other | Admitting: Family Medicine

## 2023-10-24 VITALS — BP 125/79 | HR 73 | Temp 98.1°F | Ht 74.0 in | Wt 163.6 lb

## 2023-10-24 DIAGNOSIS — N183 Chronic kidney disease, stage 3 unspecified: Secondary | ICD-10-CM

## 2023-10-24 DIAGNOSIS — I10 Essential (primary) hypertension: Secondary | ICD-10-CM

## 2023-10-24 DIAGNOSIS — E785 Hyperlipidemia, unspecified: Secondary | ICD-10-CM | POA: Diagnosis not present

## 2023-10-24 DIAGNOSIS — Z794 Long term (current) use of insulin: Secondary | ICD-10-CM

## 2023-10-24 DIAGNOSIS — E1140 Type 2 diabetes mellitus with diabetic neuropathy, unspecified: Secondary | ICD-10-CM

## 2023-10-24 NOTE — Progress Notes (Signed)
Subjective:    Patient ID: Luke Solis, male    DOB: April 03, 1931, 89 y.o.   MRN: [id number]  Discussed the use of AI scribe software for clinical note transcription with the patient, who gave verbal consent to proceed.  History of Present Illness   The patient, with a history of diabetes, kidney disease, and hyperlipidemia, has been experiencing increased fatigue and a lack of motivation to engage in usual activities, such as going out for dinner. He has also reported feeling tired and not wanting to get up from his chair. The patient's blood glucose levels have been running high, around 250, although the overall A1c is 7.3, which is within a reasonable range.  The patient has also been dealing with a significant stressor related to a pond on his property, which has been classified as a high hazard dam. This has required the hiring of a Sport and exercise psychologist and the potential for significant financial expenditure, which has been causing the patient some distress and possibly contributing to his fatigue and lack of motivation.  The patient's kidney function has remained stable, with a recent reading of 1.32. The cholesterol profile is reasonably good, with an LDL of 109, although there is a suggestion to increase the dose of rosuvastatin from 5mg  to 10mg  to try and bring the LDL under 100.  The patient has also reported some neuropathy symptoms in his feet, although he is unsure whether these are due to numbness or a burning sensation. The patient's feet were examined and appeared to be in good condition.  The patient has several upcoming appointments with specialists, including an ophthalmologist for macular degeneration and a urologist. The patient is considering cancelling the appointment with nephrology.         Review of Systems     Objective:    Physical Exam   CHEST: Clear lung sounds. CARDIOVASCULAR: Normal heart sounds. EXTREMITIES: No edema. SKIN: Feet without abnormalities.            Assessment & Plan:  Assessment and Plan    Diabetes Mellitus Recent fatigue and decreased activity. Blood glucose levels have been high, but A1C is 7.3, which is within the target range. Kidney function is stable, and there is a small amount of protein in the urine. -Continue current management and monitor blood glucose levels closely. -Check A1C, kidney function, and urine protein in 4 months.  Hyperlipidemia LDL is 109, slightly above the target of <100. -Increase Rosuvastatin from 5mg  to 10mg  daily. -Recheck lipid panel in 4 months.  Peripheral Neuropathy No significant symptoms reported. -Continue current management.  Nephrology Follow-up Stable kidney function with no significant changes. -Discontinue regular nephrology appointments. -Primary care provider to monitor kidney function and re-refer to nephrology if there is a significant decline.  General Health Maintenance -Continue regular eye exams for macular degeneration. -Continue regular foot care for diabetes. -Ensure socks are not too tight to prevent circulation issues.      1. Type 2 diabetes mellitus with diabetic neuropathy, with long-term current use of insulin (HCC) Under decent control continue current measures  2. Essential hypertension, benign Blood pressure doing well continue current measures  3. Stage 3 chronic kidney disease, unspecified whether stage 3a or 3b CKD (HCC) CKD 3 is stable We did discuss this in detail Family would like to continue following here but does not want to follow through with nephrology currently As long as he can maintain similar to where he is no need to follow-up with nephrology  4. Hyperlipidemia, unspecified hyperlipidemia type Continue statin healthy diet  Follow-up in 4 months

## 2023-10-27 DIAGNOSIS — N35919 Unspecified urethral stricture, male, unspecified site: Secondary | ICD-10-CM | POA: Diagnosis not present

## 2023-10-27 DIAGNOSIS — I129 Hypertensive chronic kidney disease with stage 1 through stage 4 chronic kidney disease, or unspecified chronic kidney disease: Secondary | ICD-10-CM | POA: Diagnosis not present

## 2023-10-27 DIAGNOSIS — E1122 Type 2 diabetes mellitus with diabetic chronic kidney disease: Secondary | ICD-10-CM | POA: Diagnosis not present

## 2023-10-27 DIAGNOSIS — Z435 Encounter for attention to cystostomy: Secondary | ICD-10-CM | POA: Diagnosis not present

## 2023-10-27 DIAGNOSIS — R296 Repeated falls: Secondary | ICD-10-CM | POA: Diagnosis not present

## 2023-10-27 DIAGNOSIS — Z466 Encounter for fitting and adjustment of urinary device: Secondary | ICD-10-CM | POA: Diagnosis not present

## 2023-10-31 DIAGNOSIS — H353211 Exudative age-related macular degeneration, right eye, with active choroidal neovascularization: Secondary | ICD-10-CM | POA: Diagnosis not present

## 2023-11-01 DIAGNOSIS — Z9181 History of falling: Secondary | ICD-10-CM | POA: Diagnosis not present

## 2023-11-01 DIAGNOSIS — E785 Hyperlipidemia, unspecified: Secondary | ICD-10-CM | POA: Diagnosis not present

## 2023-11-01 DIAGNOSIS — Z794 Long term (current) use of insulin: Secondary | ICD-10-CM | POA: Diagnosis not present

## 2023-11-01 DIAGNOSIS — Z8546 Personal history of malignant neoplasm of prostate: Secondary | ICD-10-CM | POA: Diagnosis not present

## 2023-11-01 DIAGNOSIS — N1832 Chronic kidney disease, stage 3b: Secondary | ICD-10-CM | POA: Diagnosis not present

## 2023-11-01 DIAGNOSIS — E1142 Type 2 diabetes mellitus with diabetic polyneuropathy: Secondary | ICD-10-CM | POA: Diagnosis not present

## 2023-11-01 DIAGNOSIS — Z8673 Personal history of transient ischemic attack (TIA), and cerebral infarction without residual deficits: Secondary | ICD-10-CM | POA: Diagnosis not present

## 2023-11-01 DIAGNOSIS — Z435 Encounter for attention to cystostomy: Secondary | ICD-10-CM | POA: Diagnosis not present

## 2023-11-01 DIAGNOSIS — Z8616 Personal history of COVID-19: Secondary | ICD-10-CM | POA: Diagnosis not present

## 2023-11-01 DIAGNOSIS — Z466 Encounter for fitting and adjustment of urinary device: Secondary | ICD-10-CM | POA: Diagnosis not present

## 2023-11-01 DIAGNOSIS — Z87440 Personal history of urinary (tract) infections: Secondary | ICD-10-CM | POA: Diagnosis not present

## 2023-11-01 DIAGNOSIS — I129 Hypertensive chronic kidney disease with stage 1 through stage 4 chronic kidney disease, or unspecified chronic kidney disease: Secondary | ICD-10-CM | POA: Diagnosis not present

## 2023-11-01 DIAGNOSIS — I2510 Atherosclerotic heart disease of native coronary artery without angina pectoris: Secondary | ICD-10-CM | POA: Diagnosis not present

## 2023-11-01 DIAGNOSIS — R4189 Other symptoms and signs involving cognitive functions and awareness: Secondary | ICD-10-CM | POA: Diagnosis not present

## 2023-11-01 DIAGNOSIS — G4733 Obstructive sleep apnea (adult) (pediatric): Secondary | ICD-10-CM | POA: Diagnosis not present

## 2023-11-01 DIAGNOSIS — N35919 Unspecified urethral stricture, male, unspecified site: Secondary | ICD-10-CM | POA: Diagnosis not present

## 2023-11-01 DIAGNOSIS — E1165 Type 2 diabetes mellitus with hyperglycemia: Secondary | ICD-10-CM | POA: Diagnosis not present

## 2023-11-01 DIAGNOSIS — R296 Repeated falls: Secondary | ICD-10-CM | POA: Diagnosis not present

## 2023-11-01 DIAGNOSIS — E113211 Type 2 diabetes mellitus with mild nonproliferative diabetic retinopathy with macular edema, right eye: Secondary | ICD-10-CM | POA: Diagnosis not present

## 2023-11-01 DIAGNOSIS — I951 Orthostatic hypotension: Secondary | ICD-10-CM | POA: Diagnosis not present

## 2023-11-01 DIAGNOSIS — E1122 Type 2 diabetes mellitus with diabetic chronic kidney disease: Secondary | ICD-10-CM | POA: Diagnosis not present

## 2023-11-04 DIAGNOSIS — N35919 Unspecified urethral stricture, male, unspecified site: Secondary | ICD-10-CM | POA: Diagnosis not present

## 2023-11-04 DIAGNOSIS — Z466 Encounter for fitting and adjustment of urinary device: Secondary | ICD-10-CM | POA: Diagnosis not present

## 2023-11-04 DIAGNOSIS — E1122 Type 2 diabetes mellitus with diabetic chronic kidney disease: Secondary | ICD-10-CM | POA: Diagnosis not present

## 2023-11-04 DIAGNOSIS — Z435 Encounter for attention to cystostomy: Secondary | ICD-10-CM | POA: Diagnosis not present

## 2023-11-04 DIAGNOSIS — I129 Hypertensive chronic kidney disease with stage 1 through stage 4 chronic kidney disease, or unspecified chronic kidney disease: Secondary | ICD-10-CM | POA: Diagnosis not present

## 2023-11-04 DIAGNOSIS — R296 Repeated falls: Secondary | ICD-10-CM | POA: Diagnosis not present

## 2023-11-08 ENCOUNTER — Other Ambulatory Visit: Payer: Self-pay | Admitting: Family Medicine

## 2023-11-08 NOTE — Telephone Encounter (Unsigned)
Copied from CRM [id number]. Topic: Clinical - Prescription Issue >> Nov 08, 2023 12:27 PM Orinda Kenner C wrote: Reason for CRM: Luke Solis from [facility] [phone] needs a new rx, pls send new rx and prescribe as Lantus solostar 100 unit/ml qty#6ml, that is the only way for pt insurance coverage.

## 2023-11-08 NOTE — Telephone Encounter (Signed)
May have 6 months on medication refills following her request that Luke Solis  has stated thank you

## 2023-11-09 ENCOUNTER — Other Ambulatory Visit: Payer: Self-pay

## 2023-11-09 MED ORDER — ROSUVASTATIN CALCIUM 5 MG PO TABS: 5.0000 mg | ORAL_TABLET | Freq: Every day | ORAL | 1 refills | Status: DC

## 2023-11-09 MED ORDER — POTASSIUM CHLORIDE ER 10 MEQ PO TBCR: 10.0000 meq | EXTENDED_RELEASE_TABLET | Freq: Every day | ORAL | 1 refills | Status: DC

## 2023-11-09 MED ORDER — INSULIN GLARGINE 100 UNIT/ML ~~LOC~~ SOLN: SUBCUTANEOUS | 5 refills | Status: DC

## 2023-11-17 ENCOUNTER — Telehealth: Payer: Self-pay

## 2023-11-17 NOTE — Telephone Encounter (Signed)
Caller/Agency: [facility]    Callback Number: [phone]    Service Requested: Physical Therapy/Occupational Therapy    Frequency: Eval and treatment    Any new concerns about the patient? Yes

## 2023-11-18 NOTE — Telephone Encounter (Signed)
May have referral for the physical therapy if he needs something else let me know

## 2023-11-18 NOTE — Telephone Encounter (Signed)
Telephone call- mailbox is full with home health

## 2023-11-21 NOTE — Telephone Encounter (Signed)
Verbal order given to Leigh at home health

## 2023-12-01 DIAGNOSIS — Z8546 Personal history of malignant neoplasm of prostate: Secondary | ICD-10-CM | POA: Diagnosis not present

## 2023-12-01 DIAGNOSIS — Z87440 Personal history of urinary (tract) infections: Secondary | ICD-10-CM | POA: Diagnosis not present

## 2023-12-01 DIAGNOSIS — E1142 Type 2 diabetes mellitus with diabetic polyneuropathy: Secondary | ICD-10-CM | POA: Diagnosis not present

## 2023-12-01 DIAGNOSIS — I129 Hypertensive chronic kidney disease with stage 1 through stage 4 chronic kidney disease, or unspecified chronic kidney disease: Secondary | ICD-10-CM | POA: Diagnosis not present

## 2023-12-01 DIAGNOSIS — Z435 Encounter for attention to cystostomy: Secondary | ICD-10-CM | POA: Diagnosis not present

## 2023-12-01 DIAGNOSIS — Z8616 Personal history of COVID-19: Secondary | ICD-10-CM | POA: Diagnosis not present

## 2023-12-01 DIAGNOSIS — I951 Orthostatic hypotension: Secondary | ICD-10-CM | POA: Diagnosis not present

## 2023-12-01 DIAGNOSIS — Z8673 Personal history of transient ischemic attack (TIA), and cerebral infarction without residual deficits: Secondary | ICD-10-CM | POA: Diagnosis not present

## 2023-12-01 DIAGNOSIS — R296 Repeated falls: Secondary | ICD-10-CM | POA: Diagnosis not present

## 2023-12-01 DIAGNOSIS — Z9181 History of falling: Secondary | ICD-10-CM | POA: Diagnosis not present

## 2023-12-01 DIAGNOSIS — R4189 Other symptoms and signs involving cognitive functions and awareness: Secondary | ICD-10-CM | POA: Diagnosis not present

## 2023-12-01 DIAGNOSIS — E1165 Type 2 diabetes mellitus with hyperglycemia: Secondary | ICD-10-CM | POA: Diagnosis not present

## 2023-12-01 DIAGNOSIS — G4733 Obstructive sleep apnea (adult) (pediatric): Secondary | ICD-10-CM | POA: Diagnosis not present

## 2023-12-01 DIAGNOSIS — N1832 Chronic kidney disease, stage 3b: Secondary | ICD-10-CM | POA: Diagnosis not present

## 2023-12-01 DIAGNOSIS — Z466 Encounter for fitting and adjustment of urinary device: Secondary | ICD-10-CM | POA: Diagnosis not present

## 2023-12-01 DIAGNOSIS — E113211 Type 2 diabetes mellitus with mild nonproliferative diabetic retinopathy with macular edema, right eye: Secondary | ICD-10-CM | POA: Diagnosis not present

## 2023-12-01 DIAGNOSIS — E1122 Type 2 diabetes mellitus with diabetic chronic kidney disease: Secondary | ICD-10-CM | POA: Diagnosis not present

## 2023-12-01 DIAGNOSIS — N35919 Unspecified urethral stricture, male, unspecified site: Secondary | ICD-10-CM | POA: Diagnosis not present

## 2023-12-01 DIAGNOSIS — Z794 Long term (current) use of insulin: Secondary | ICD-10-CM | POA: Diagnosis not present

## 2023-12-01 DIAGNOSIS — E785 Hyperlipidemia, unspecified: Secondary | ICD-10-CM | POA: Diagnosis not present

## 2023-12-01 DIAGNOSIS — I2510 Atherosclerotic heart disease of native coronary artery without angina pectoris: Secondary | ICD-10-CM | POA: Diagnosis not present

## 2023-12-03 DIAGNOSIS — N35919 Unspecified urethral stricture, male, unspecified site: Secondary | ICD-10-CM | POA: Diagnosis not present

## 2023-12-03 DIAGNOSIS — Z466 Encounter for fitting and adjustment of urinary device: Secondary | ICD-10-CM | POA: Diagnosis not present

## 2023-12-03 DIAGNOSIS — Z435 Encounter for attention to cystostomy: Secondary | ICD-10-CM | POA: Diagnosis not present

## 2023-12-03 DIAGNOSIS — R296 Repeated falls: Secondary | ICD-10-CM | POA: Diagnosis not present

## 2023-12-03 DIAGNOSIS — E1122 Type 2 diabetes mellitus with diabetic chronic kidney disease: Secondary | ICD-10-CM | POA: Diagnosis not present

## 2023-12-03 DIAGNOSIS — I129 Hypertensive chronic kidney disease with stage 1 through stage 4 chronic kidney disease, or unspecified chronic kidney disease: Secondary | ICD-10-CM | POA: Diagnosis not present

## 2023-12-15 DIAGNOSIS — N35919 Unspecified urethral stricture, male, unspecified site: Secondary | ICD-10-CM | POA: Diagnosis not present

## 2023-12-15 DIAGNOSIS — I129 Hypertensive chronic kidney disease with stage 1 through stage 4 chronic kidney disease, or unspecified chronic kidney disease: Secondary | ICD-10-CM | POA: Diagnosis not present

## 2023-12-15 DIAGNOSIS — Z466 Encounter for fitting and adjustment of urinary device: Secondary | ICD-10-CM | POA: Diagnosis not present

## 2023-12-15 DIAGNOSIS — R296 Repeated falls: Secondary | ICD-10-CM | POA: Diagnosis not present

## 2023-12-15 DIAGNOSIS — Z435 Encounter for attention to cystostomy: Secondary | ICD-10-CM | POA: Diagnosis not present

## 2023-12-15 DIAGNOSIS — E1122 Type 2 diabetes mellitus with diabetic chronic kidney disease: Secondary | ICD-10-CM | POA: Diagnosis not present

## 2023-12-22 DIAGNOSIS — N1832 Chronic kidney disease, stage 3b: Secondary | ICD-10-CM | POA: Diagnosis not present

## 2023-12-22 DIAGNOSIS — I951 Orthostatic hypotension: Secondary | ICD-10-CM | POA: Diagnosis not present

## 2023-12-22 DIAGNOSIS — Z435 Encounter for attention to cystostomy: Secondary | ICD-10-CM | POA: Diagnosis not present

## 2023-12-22 DIAGNOSIS — R296 Repeated falls: Secondary | ICD-10-CM | POA: Diagnosis not present

## 2023-12-22 DIAGNOSIS — E1165 Type 2 diabetes mellitus with hyperglycemia: Secondary | ICD-10-CM | POA: Diagnosis not present

## 2023-12-22 DIAGNOSIS — E1122 Type 2 diabetes mellitus with diabetic chronic kidney disease: Secondary | ICD-10-CM | POA: Diagnosis not present

## 2023-12-22 DIAGNOSIS — I2510 Atherosclerotic heart disease of native coronary artery without angina pectoris: Secondary | ICD-10-CM | POA: Diagnosis not present

## 2023-12-22 DIAGNOSIS — N35919 Unspecified urethral stricture, male, unspecified site: Secondary | ICD-10-CM | POA: Diagnosis not present

## 2023-12-22 DIAGNOSIS — I129 Hypertensive chronic kidney disease with stage 1 through stage 4 chronic kidney disease, or unspecified chronic kidney disease: Secondary | ICD-10-CM | POA: Diagnosis not present

## 2023-12-22 DIAGNOSIS — E113211 Type 2 diabetes mellitus with mild nonproliferative diabetic retinopathy with macular edema, right eye: Secondary | ICD-10-CM | POA: Diagnosis not present

## 2023-12-22 DIAGNOSIS — Z466 Encounter for fitting and adjustment of urinary device: Secondary | ICD-10-CM | POA: Diagnosis not present

## 2023-12-22 DIAGNOSIS — E1142 Type 2 diabetes mellitus with diabetic polyneuropathy: Secondary | ICD-10-CM | POA: Diagnosis not present

## 2023-12-23 DIAGNOSIS — Z435 Encounter for attention to cystostomy: Secondary | ICD-10-CM | POA: Diagnosis not present

## 2023-12-23 DIAGNOSIS — I129 Hypertensive chronic kidney disease with stage 1 through stage 4 chronic kidney disease, or unspecified chronic kidney disease: Secondary | ICD-10-CM | POA: Diagnosis not present

## 2023-12-23 DIAGNOSIS — E1122 Type 2 diabetes mellitus with diabetic chronic kidney disease: Secondary | ICD-10-CM | POA: Diagnosis not present

## 2023-12-23 DIAGNOSIS — N35919 Unspecified urethral stricture, male, unspecified site: Secondary | ICD-10-CM | POA: Diagnosis not present

## 2023-12-23 DIAGNOSIS — Z466 Encounter for fitting and adjustment of urinary device: Secondary | ICD-10-CM | POA: Diagnosis not present

## 2023-12-23 DIAGNOSIS — R296 Repeated falls: Secondary | ICD-10-CM | POA: Diagnosis not present

## 2023-12-26 DIAGNOSIS — R296 Repeated falls: Secondary | ICD-10-CM | POA: Diagnosis not present

## 2023-12-26 DIAGNOSIS — E1122 Type 2 diabetes mellitus with diabetic chronic kidney disease: Secondary | ICD-10-CM | POA: Diagnosis not present

## 2023-12-26 DIAGNOSIS — Z466 Encounter for fitting and adjustment of urinary device: Secondary | ICD-10-CM | POA: Diagnosis not present

## 2023-12-26 DIAGNOSIS — I129 Hypertensive chronic kidney disease with stage 1 through stage 4 chronic kidney disease, or unspecified chronic kidney disease: Secondary | ICD-10-CM | POA: Diagnosis not present

## 2023-12-26 DIAGNOSIS — Z435 Encounter for attention to cystostomy: Secondary | ICD-10-CM | POA: Diagnosis not present

## 2023-12-26 DIAGNOSIS — N35919 Unspecified urethral stricture, male, unspecified site: Secondary | ICD-10-CM | POA: Diagnosis not present

## 2023-12-31 DIAGNOSIS — I951 Orthostatic hypotension: Secondary | ICD-10-CM | POA: Diagnosis not present

## 2023-12-31 DIAGNOSIS — I2510 Atherosclerotic heart disease of native coronary artery without angina pectoris: Secondary | ICD-10-CM | POA: Diagnosis not present

## 2023-12-31 DIAGNOSIS — R296 Repeated falls: Secondary | ICD-10-CM | POA: Diagnosis not present

## 2023-12-31 DIAGNOSIS — Z435 Encounter for attention to cystostomy: Secondary | ICD-10-CM | POA: Diagnosis not present

## 2023-12-31 DIAGNOSIS — E785 Hyperlipidemia, unspecified: Secondary | ICD-10-CM | POA: Diagnosis not present

## 2023-12-31 DIAGNOSIS — Z9181 History of falling: Secondary | ICD-10-CM | POA: Diagnosis not present

## 2023-12-31 DIAGNOSIS — Z8546 Personal history of malignant neoplasm of prostate: Secondary | ICD-10-CM | POA: Diagnosis not present

## 2023-12-31 DIAGNOSIS — E1142 Type 2 diabetes mellitus with diabetic polyneuropathy: Secondary | ICD-10-CM | POA: Diagnosis not present

## 2023-12-31 DIAGNOSIS — Z87440 Personal history of urinary (tract) infections: Secondary | ICD-10-CM | POA: Diagnosis not present

## 2023-12-31 DIAGNOSIS — E113211 Type 2 diabetes mellitus with mild nonproliferative diabetic retinopathy with macular edema, right eye: Secondary | ICD-10-CM | POA: Diagnosis not present

## 2023-12-31 DIAGNOSIS — Z8673 Personal history of transient ischemic attack (TIA), and cerebral infarction without residual deficits: Secondary | ICD-10-CM | POA: Diagnosis not present

## 2023-12-31 DIAGNOSIS — Z794 Long term (current) use of insulin: Secondary | ICD-10-CM | POA: Diagnosis not present

## 2023-12-31 DIAGNOSIS — N35919 Unspecified urethral stricture, male, unspecified site: Secondary | ICD-10-CM | POA: Diagnosis not present

## 2023-12-31 DIAGNOSIS — G4733 Obstructive sleep apnea (adult) (pediatric): Secondary | ICD-10-CM | POA: Diagnosis not present

## 2023-12-31 DIAGNOSIS — I129 Hypertensive chronic kidney disease with stage 1 through stage 4 chronic kidney disease, or unspecified chronic kidney disease: Secondary | ICD-10-CM | POA: Diagnosis not present

## 2023-12-31 DIAGNOSIS — Z466 Encounter for fitting and adjustment of urinary device: Secondary | ICD-10-CM | POA: Diagnosis not present

## 2023-12-31 DIAGNOSIS — E1122 Type 2 diabetes mellitus with diabetic chronic kidney disease: Secondary | ICD-10-CM | POA: Diagnosis not present

## 2023-12-31 DIAGNOSIS — Z8616 Personal history of COVID-19: Secondary | ICD-10-CM | POA: Diagnosis not present

## 2023-12-31 DIAGNOSIS — R4189 Other symptoms and signs involving cognitive functions and awareness: Secondary | ICD-10-CM | POA: Diagnosis not present

## 2023-12-31 DIAGNOSIS — E1165 Type 2 diabetes mellitus with hyperglycemia: Secondary | ICD-10-CM | POA: Diagnosis not present

## 2023-12-31 DIAGNOSIS — N1832 Chronic kidney disease, stage 3b: Secondary | ICD-10-CM | POA: Diagnosis not present

## 2024-01-02 DIAGNOSIS — I129 Hypertensive chronic kidney disease with stage 1 through stage 4 chronic kidney disease, or unspecified chronic kidney disease: Secondary | ICD-10-CM | POA: Diagnosis not present

## 2024-01-02 DIAGNOSIS — Z435 Encounter for attention to cystostomy: Secondary | ICD-10-CM | POA: Diagnosis not present

## 2024-01-02 DIAGNOSIS — Z466 Encounter for fitting and adjustment of urinary device: Secondary | ICD-10-CM | POA: Diagnosis not present

## 2024-01-02 DIAGNOSIS — N35919 Unspecified urethral stricture, male, unspecified site: Secondary | ICD-10-CM | POA: Diagnosis not present

## 2024-01-02 DIAGNOSIS — E1122 Type 2 diabetes mellitus with diabetic chronic kidney disease: Secondary | ICD-10-CM | POA: Diagnosis not present

## 2024-01-02 DIAGNOSIS — R296 Repeated falls: Secondary | ICD-10-CM | POA: Diagnosis not present

## 2024-01-03 ENCOUNTER — Telehealth: Payer: Self-pay

## 2024-01-03 NOTE — Telephone Encounter (Signed)
--  Requesting OT freq. 1 x 3 weeks --[facility] [phone]--  --Copied from CRM #[id number]. Topic: Clinical - Home Health Verbal Orders >> Jan 03, 2024  8:35 AM Maxwell Marion wrote: Caller/Agency: [facility] Callback Number: [phone] Service Requested: Occupational Therapy Frequency: 1 time for 3 weeks Any new concerns about the patient? No

## 2024-01-03 NOTE — Telephone Encounter (Signed)
Left message for Luke Solis to inform per drs notes for OT orders.

## 2024-01-03 NOTE — Telephone Encounter (Signed)
May have verbal order thank you

## 2024-01-04 DIAGNOSIS — E1122 Type 2 diabetes mellitus with diabetic chronic kidney disease: Secondary | ICD-10-CM | POA: Diagnosis not present

## 2024-01-04 DIAGNOSIS — Z466 Encounter for fitting and adjustment of urinary device: Secondary | ICD-10-CM | POA: Diagnosis not present

## 2024-01-04 DIAGNOSIS — N35919 Unspecified urethral stricture, male, unspecified site: Secondary | ICD-10-CM | POA: Diagnosis not present

## 2024-01-04 DIAGNOSIS — I129 Hypertensive chronic kidney disease with stage 1 through stage 4 chronic kidney disease, or unspecified chronic kidney disease: Secondary | ICD-10-CM | POA: Diagnosis not present

## 2024-01-04 DIAGNOSIS — Z435 Encounter for attention to cystostomy: Secondary | ICD-10-CM | POA: Diagnosis not present

## 2024-01-04 DIAGNOSIS — R296 Repeated falls: Secondary | ICD-10-CM | POA: Diagnosis not present

## 2024-01-11 DIAGNOSIS — Z466 Encounter for fitting and adjustment of urinary device: Secondary | ICD-10-CM | POA: Diagnosis not present

## 2024-01-11 DIAGNOSIS — R296 Repeated falls: Secondary | ICD-10-CM | POA: Diagnosis not present

## 2024-01-11 DIAGNOSIS — Z435 Encounter for attention to cystostomy: Secondary | ICD-10-CM | POA: Diagnosis not present

## 2024-01-11 DIAGNOSIS — I129 Hypertensive chronic kidney disease with stage 1 through stage 4 chronic kidney disease, or unspecified chronic kidney disease: Secondary | ICD-10-CM | POA: Diagnosis not present

## 2024-01-11 DIAGNOSIS — E1122 Type 2 diabetes mellitus with diabetic chronic kidney disease: Secondary | ICD-10-CM | POA: Diagnosis not present

## 2024-01-11 DIAGNOSIS — N35919 Unspecified urethral stricture, male, unspecified site: Secondary | ICD-10-CM | POA: Diagnosis not present

## 2024-01-12 ENCOUNTER — Other Ambulatory Visit: Payer: Self-pay | Admitting: Family Medicine

## 2024-01-13 ENCOUNTER — Other Ambulatory Visit: Payer: Self-pay | Admitting: Family Medicine

## 2024-01-16 DIAGNOSIS — Z435 Encounter for attention to cystostomy: Secondary | ICD-10-CM | POA: Diagnosis not present

## 2024-01-16 DIAGNOSIS — Z466 Encounter for fitting and adjustment of urinary device: Secondary | ICD-10-CM | POA: Diagnosis not present

## 2024-01-16 DIAGNOSIS — E1122 Type 2 diabetes mellitus with diabetic chronic kidney disease: Secondary | ICD-10-CM | POA: Diagnosis not present

## 2024-01-16 DIAGNOSIS — R296 Repeated falls: Secondary | ICD-10-CM | POA: Diagnosis not present

## 2024-01-16 DIAGNOSIS — N35919 Unspecified urethral stricture, male, unspecified site: Secondary | ICD-10-CM | POA: Diagnosis not present

## 2024-01-16 DIAGNOSIS — I129 Hypertensive chronic kidney disease with stage 1 through stage 4 chronic kidney disease, or unspecified chronic kidney disease: Secondary | ICD-10-CM | POA: Diagnosis not present

## 2024-01-27 DIAGNOSIS — N35919 Unspecified urethral stricture, male, unspecified site: Secondary | ICD-10-CM | POA: Diagnosis not present

## 2024-01-27 DIAGNOSIS — I129 Hypertensive chronic kidney disease with stage 1 through stage 4 chronic kidney disease, or unspecified chronic kidney disease: Secondary | ICD-10-CM | POA: Diagnosis not present

## 2024-01-27 DIAGNOSIS — E1122 Type 2 diabetes mellitus with diabetic chronic kidney disease: Secondary | ICD-10-CM | POA: Diagnosis not present

## 2024-01-27 DIAGNOSIS — R296 Repeated falls: Secondary | ICD-10-CM | POA: Diagnosis not present

## 2024-01-27 DIAGNOSIS — Z435 Encounter for attention to cystostomy: Secondary | ICD-10-CM | POA: Diagnosis not present

## 2024-01-27 DIAGNOSIS — Z466 Encounter for fitting and adjustment of urinary device: Secondary | ICD-10-CM | POA: Diagnosis not present

## 2024-01-28 DIAGNOSIS — Z466 Encounter for fitting and adjustment of urinary device: Secondary | ICD-10-CM | POA: Diagnosis not present

## 2024-01-28 DIAGNOSIS — E1122 Type 2 diabetes mellitus with diabetic chronic kidney disease: Secondary | ICD-10-CM | POA: Diagnosis not present

## 2024-01-28 DIAGNOSIS — N35919 Unspecified urethral stricture, male, unspecified site: Secondary | ICD-10-CM | POA: Diagnosis not present

## 2024-01-28 DIAGNOSIS — R296 Repeated falls: Secondary | ICD-10-CM | POA: Diagnosis not present

## 2024-01-28 DIAGNOSIS — Z435 Encounter for attention to cystostomy: Secondary | ICD-10-CM | POA: Diagnosis not present

## 2024-01-28 DIAGNOSIS — I129 Hypertensive chronic kidney disease with stage 1 through stage 4 chronic kidney disease, or unspecified chronic kidney disease: Secondary | ICD-10-CM | POA: Diagnosis not present

## 2024-01-30 DIAGNOSIS — R296 Repeated falls: Secondary | ICD-10-CM | POA: Diagnosis not present

## 2024-01-30 DIAGNOSIS — H353122 Nonexudative age-related macular degeneration, left eye, intermediate dry stage: Secondary | ICD-10-CM | POA: Diagnosis not present

## 2024-01-30 DIAGNOSIS — Z794 Long term (current) use of insulin: Secondary | ICD-10-CM | POA: Diagnosis not present

## 2024-01-30 DIAGNOSIS — I129 Hypertensive chronic kidney disease with stage 1 through stage 4 chronic kidney disease, or unspecified chronic kidney disease: Secondary | ICD-10-CM | POA: Diagnosis not present

## 2024-01-30 DIAGNOSIS — E1142 Type 2 diabetes mellitus with diabetic polyneuropathy: Secondary | ICD-10-CM | POA: Diagnosis not present

## 2024-01-30 DIAGNOSIS — R4189 Other symptoms and signs involving cognitive functions and awareness: Secondary | ICD-10-CM | POA: Diagnosis not present

## 2024-01-30 DIAGNOSIS — Z8616 Personal history of COVID-19: Secondary | ICD-10-CM | POA: Diagnosis not present

## 2024-01-30 DIAGNOSIS — Z8546 Personal history of malignant neoplasm of prostate: Secondary | ICD-10-CM | POA: Diagnosis not present

## 2024-01-30 DIAGNOSIS — Z435 Encounter for attention to cystostomy: Secondary | ICD-10-CM | POA: Diagnosis not present

## 2024-01-30 DIAGNOSIS — E113211 Type 2 diabetes mellitus with mild nonproliferative diabetic retinopathy with macular edema, right eye: Secondary | ICD-10-CM | POA: Diagnosis not present

## 2024-01-30 DIAGNOSIS — I951 Orthostatic hypotension: Secondary | ICD-10-CM | POA: Diagnosis not present

## 2024-01-30 DIAGNOSIS — E1169 Type 2 diabetes mellitus with other specified complication: Secondary | ICD-10-CM | POA: Diagnosis not present

## 2024-01-30 DIAGNOSIS — Z466 Encounter for fitting and adjustment of urinary device: Secondary | ICD-10-CM | POA: Diagnosis not present

## 2024-01-30 DIAGNOSIS — Z9181 History of falling: Secondary | ICD-10-CM | POA: Diagnosis not present

## 2024-01-30 DIAGNOSIS — N35919 Unspecified urethral stricture, male, unspecified site: Secondary | ICD-10-CM | POA: Diagnosis not present

## 2024-01-30 DIAGNOSIS — E119 Type 2 diabetes mellitus without complications: Secondary | ICD-10-CM | POA: Diagnosis not present

## 2024-01-30 DIAGNOSIS — H35033 Hypertensive retinopathy, bilateral: Secondary | ICD-10-CM | POA: Diagnosis not present

## 2024-01-30 DIAGNOSIS — Z87440 Personal history of urinary (tract) infections: Secondary | ICD-10-CM | POA: Diagnosis not present

## 2024-01-30 DIAGNOSIS — N1832 Chronic kidney disease, stage 3b: Secondary | ICD-10-CM | POA: Diagnosis not present

## 2024-01-30 DIAGNOSIS — H353211 Exudative age-related macular degeneration, right eye, with active choroidal neovascularization: Secondary | ICD-10-CM | POA: Diagnosis not present

## 2024-01-30 DIAGNOSIS — H43813 Vitreous degeneration, bilateral: Secondary | ICD-10-CM | POA: Diagnosis not present

## 2024-01-30 DIAGNOSIS — E1165 Type 2 diabetes mellitus with hyperglycemia: Secondary | ICD-10-CM | POA: Diagnosis not present

## 2024-01-30 DIAGNOSIS — E785 Hyperlipidemia, unspecified: Secondary | ICD-10-CM | POA: Diagnosis not present

## 2024-01-30 DIAGNOSIS — M6281 Muscle weakness (generalized): Secondary | ICD-10-CM | POA: Diagnosis not present

## 2024-01-30 DIAGNOSIS — E1122 Type 2 diabetes mellitus with diabetic chronic kidney disease: Secondary | ICD-10-CM | POA: Diagnosis not present

## 2024-01-30 DIAGNOSIS — I2510 Atherosclerotic heart disease of native coronary artery without angina pectoris: Secondary | ICD-10-CM | POA: Diagnosis not present

## 2024-01-30 DIAGNOSIS — G4733 Obstructive sleep apnea (adult) (pediatric): Secondary | ICD-10-CM | POA: Diagnosis not present

## 2024-01-30 DIAGNOSIS — Z8673 Personal history of transient ischemic attack (TIA), and cerebral infarction without residual deficits: Secondary | ICD-10-CM | POA: Diagnosis not present

## 2024-02-03 DIAGNOSIS — Z466 Encounter for fitting and adjustment of urinary device: Secondary | ICD-10-CM | POA: Diagnosis not present

## 2024-02-03 DIAGNOSIS — E1122 Type 2 diabetes mellitus with diabetic chronic kidney disease: Secondary | ICD-10-CM | POA: Diagnosis not present

## 2024-02-03 DIAGNOSIS — I129 Hypertensive chronic kidney disease with stage 1 through stage 4 chronic kidney disease, or unspecified chronic kidney disease: Secondary | ICD-10-CM | POA: Diagnosis not present

## 2024-02-03 DIAGNOSIS — Z435 Encounter for attention to cystostomy: Secondary | ICD-10-CM | POA: Diagnosis not present

## 2024-02-03 DIAGNOSIS — R296 Repeated falls: Secondary | ICD-10-CM | POA: Diagnosis not present

## 2024-02-03 DIAGNOSIS — N35919 Unspecified urethral stricture, male, unspecified site: Secondary | ICD-10-CM | POA: Diagnosis not present

## 2024-02-06 DIAGNOSIS — E1122 Type 2 diabetes mellitus with diabetic chronic kidney disease: Secondary | ICD-10-CM

## 2024-02-06 DIAGNOSIS — Z435 Encounter for attention to cystostomy: Secondary | ICD-10-CM

## 2024-02-06 DIAGNOSIS — N1832 Chronic kidney disease, stage 3b: Secondary | ICD-10-CM

## 2024-02-06 DIAGNOSIS — E113211 Type 2 diabetes mellitus with mild nonproliferative diabetic retinopathy with macular edema, right eye: Secondary | ICD-10-CM

## 2024-02-06 DIAGNOSIS — E1142 Type 2 diabetes mellitus with diabetic polyneuropathy: Secondary | ICD-10-CM

## 2024-02-06 DIAGNOSIS — R296 Repeated falls: Secondary | ICD-10-CM

## 2024-02-06 DIAGNOSIS — I951 Orthostatic hypotension: Secondary | ICD-10-CM

## 2024-02-06 DIAGNOSIS — I129 Hypertensive chronic kidney disease with stage 1 through stage 4 chronic kidney disease, or unspecified chronic kidney disease: Secondary | ICD-10-CM

## 2024-02-06 DIAGNOSIS — N35919 Unspecified urethral stricture, male, unspecified site: Secondary | ICD-10-CM

## 2024-02-06 DIAGNOSIS — I2510 Atherosclerotic heart disease of native coronary artery without angina pectoris: Secondary | ICD-10-CM

## 2024-02-06 DIAGNOSIS — Z466 Encounter for fitting and adjustment of urinary device: Secondary | ICD-10-CM

## 2024-02-06 DIAGNOSIS — E1165 Type 2 diabetes mellitus with hyperglycemia: Secondary | ICD-10-CM

## 2024-02-09 ENCOUNTER — Other Ambulatory Visit: Payer: Self-pay | Admitting: Family Medicine

## 2024-02-23 ENCOUNTER — Encounter: Payer: Self-pay | Admitting: Family Medicine

## 2024-02-23 ENCOUNTER — Ambulatory Visit (INDEPENDENT_AMBULATORY_CARE_PROVIDER_SITE_OTHER): Payer: Medicare Other | Admitting: Family Medicine

## 2024-02-23 VITALS — BP 129/71 | HR 60 | Temp 98.2°F | Ht 74.0 in | Wt 165.0 lb

## 2024-02-23 DIAGNOSIS — N183 Chronic kidney disease, stage 3 unspecified: Secondary | ICD-10-CM | POA: Diagnosis not present

## 2024-02-23 DIAGNOSIS — C61 Malignant neoplasm of prostate: Secondary | ICD-10-CM

## 2024-02-23 DIAGNOSIS — E785 Hyperlipidemia, unspecified: Secondary | ICD-10-CM

## 2024-02-23 DIAGNOSIS — R159 Full incontinence of feces: Secondary | ICD-10-CM

## 2024-02-23 DIAGNOSIS — N1832 Chronic kidney disease, stage 3b: Secondary | ICD-10-CM | POA: Diagnosis not present

## 2024-02-23 DIAGNOSIS — I10 Essential (primary) hypertension: Secondary | ICD-10-CM

## 2024-02-23 DIAGNOSIS — Z794 Long term (current) use of insulin: Secondary | ICD-10-CM | POA: Diagnosis not present

## 2024-02-23 DIAGNOSIS — E1140 Type 2 diabetes mellitus with diabetic neuropathy, unspecified: Secondary | ICD-10-CM | POA: Diagnosis not present

## 2024-02-23 DIAGNOSIS — Z9359 Other cystostomy status: Secondary | ICD-10-CM

## 2024-02-23 NOTE — Progress Notes (Signed)
 Subjective:    Patient ID: Luke Solis, male    DOB: 21-Nov-1931, 89 y.o.   MRN: [id number]  Discussed the use of AI scribe software for clinical note transcription with the patient, who gave verbal consent to proceed.  History of Present Illness   Luke MOHLER "Link Snuffer" is a 89 year old male who presents for follow-up on his chronic conditions. He is accompanied by Trilby Leaver, his caregiver.  He maintains good balance with the use of a cane but feels shaky without it. He has not experienced any recent falls. He uses a cane when moving in areas without support and holds onto objects when moving around the house, including using the rail on stairs. He does not drive regularly but did so recently when necessary, finding it challenging due to lack of practice.  He has a history of neuropathy, initially overshadowed by prostate cancer. He experiences tolerable leg pain, managed with Lyrica and occasionally Tylenol if the pain worsens.  He uses a suprapubic catheter, changed monthly. Last month, the change was delayed, causing pain. He reports blood around the catheter site but no recent fevers. He visits urology once a year.  He manages diabetes with four insulin shots daily, including 12 units of long-acting insulin at night. He experienced one episode of low blood sugar at night, managed by eating a cookie. His last A1c was 7.3 in November. He drinks Diet Coke daily and has reduced lemonade consumption. He takes amlodipine regularly for blood pressure, using a weekly pill packet.  He experiences bowel incontinence, requiring the use of Depends. He describes the urgency as sudden, with stool often not reaching the toilet. This issue has persisted for about six months.  He undergoes annual eye exams and receives injections for blood in the eye. He maintains a stable weight and follows up with necessary specialists.         Review of Systems     Objective:    Physical Exam   MEASUREMENTS:  Weight- 165. CHEST: Lungs clear to auscultation bilaterally. CARDIOVASCULAR: Heart sounds normal. RECTAL: Rectal tone present. EXTREMITIES: No ankle swelling.   Reasonable tone on rectal exam no masses enlarged prostate noted       Assessment & Plan:  Assessment and Plan    Bowel Incontinence Chronic bowel incontinence with sudden urge and soiling. Potential for management due to some muscle tone. Impact on quality of life discussed. - Consult gastroenterology for management recommendations.  Diabetes Mellitus Diabetes managed with insulin. Recent nocturnal hypoglycemia managed with a cookie. A1c at 7.3% indicates good control. Discussed using orange juice for quicker hypoglycemia correction. - Continue current insulin regimen. - Advise using orange juice for quicker correction of hypoglycemia. - Order blood work in April, including A1c and kidney function tests.  Diabetic Neuropathy Long-standing neuropathy in legs with pain managed by Lyrica. Advised on regular foot inspections and avoiding barefoot walking outside. - Continue Lyrica for neuropathy pain management. - Perform diabetic foot exam. - Advise daily foot inspections for blisters or sores. - Instruct to wear shoes or sandals outside to prevent injury.  Hypertension Hypertension managed with amlodipine. Adheres to medication regimen. - Continue amlodipine for blood pressure management.  Suprapubic Catheter Management Monthly catheter changes required to prevent complications. Occasional blood around site, no recent infections. Annual urology follow-up scheduled. - Ensure timely monthly catheter changes.  General Health Maintenance Active with cane for balance, no recent falls. Regular eye exams and intravitreal injections for ocular bleeding. Weight stable, not  currently driving. - Encourage continued use of cane for balance. - Schedule annual eye exam. - Monitor weight regularly.  Follow-up Follow-up planned to  monitor health status and adjust management. - Schedule follow-up appointment in August. - Perform blood work in the first week of May.     Labs ordered patient will do these in early May  1. Type 2 diabetes mellitus with diabetic neuropathy, with long-term current use of insulin (HCC) (Primary) Had 1 low sugar spells try to do the best he can with healthy eating continue current measures using 12 units long acting insulin - Hemoglobin A1c  2. Essential hypertension, benign Blood pressure good control continue current measures  3. Stage 3 chronic kidney disease, unspecified whether stage 3a or 3b CKD (HCC) GFR actually improved on last lab work patient will do lab work in May await findings - Basic metabolic panel - PSA  4. Hyperlipidemia, unspecified hyperlipidemia type At continue rosuvastatin - Lipid panel  5. Encopresi  Patient with intermittent encopresis.  He rectal tone reasonable on rectal exam Will touch base with gastroenterology to see if there is any interventions that may be helpful 6. Prostate cancer [facility]) Check PSA has been treated with radiation - PSA  7. Suprapubic catheter (HCC) No current fevers or hematuria continue current measures changes catheter once per month at home  8. Stage 3b chronic kidney disease (HCC) Repeat kidney functions in approximately 8 weeks follow-up later in May

## 2024-02-28 DIAGNOSIS — Z466 Encounter for fitting and adjustment of urinary device: Secondary | ICD-10-CM | POA: Diagnosis not present

## 2024-02-28 DIAGNOSIS — Z435 Encounter for attention to cystostomy: Secondary | ICD-10-CM | POA: Diagnosis not present

## 2024-02-28 DIAGNOSIS — N35919 Unspecified urethral stricture, male, unspecified site: Secondary | ICD-10-CM | POA: Diagnosis not present

## 2024-02-28 DIAGNOSIS — I129 Hypertensive chronic kidney disease with stage 1 through stage 4 chronic kidney disease, or unspecified chronic kidney disease: Secondary | ICD-10-CM | POA: Diagnosis not present

## 2024-02-28 DIAGNOSIS — E1122 Type 2 diabetes mellitus with diabetic chronic kidney disease: Secondary | ICD-10-CM | POA: Diagnosis not present

## 2024-02-28 DIAGNOSIS — R296 Repeated falls: Secondary | ICD-10-CM | POA: Diagnosis not present

## 2024-02-29 DIAGNOSIS — M79671 Pain in right foot: Secondary | ICD-10-CM | POA: Diagnosis not present

## 2024-02-29 DIAGNOSIS — I951 Orthostatic hypotension: Secondary | ICD-10-CM | POA: Diagnosis not present

## 2024-02-29 DIAGNOSIS — Z466 Encounter for fitting and adjustment of urinary device: Secondary | ICD-10-CM | POA: Diagnosis not present

## 2024-02-29 DIAGNOSIS — E1122 Type 2 diabetes mellitus with diabetic chronic kidney disease: Secondary | ICD-10-CM | POA: Diagnosis not present

## 2024-02-29 DIAGNOSIS — Z794 Long term (current) use of insulin: Secondary | ICD-10-CM | POA: Diagnosis not present

## 2024-02-29 DIAGNOSIS — E1142 Type 2 diabetes mellitus with diabetic polyneuropathy: Secondary | ICD-10-CM | POA: Diagnosis not present

## 2024-02-29 DIAGNOSIS — N35919 Unspecified urethral stricture, male, unspecified site: Secondary | ICD-10-CM | POA: Diagnosis not present

## 2024-02-29 DIAGNOSIS — E785 Hyperlipidemia, unspecified: Secondary | ICD-10-CM | POA: Diagnosis not present

## 2024-02-29 DIAGNOSIS — N1832 Chronic kidney disease, stage 3b: Secondary | ICD-10-CM | POA: Diagnosis not present

## 2024-02-29 DIAGNOSIS — Z8673 Personal history of transient ischemic attack (TIA), and cerebral infarction without residual deficits: Secondary | ICD-10-CM | POA: Diagnosis not present

## 2024-02-29 DIAGNOSIS — Z435 Encounter for attention to cystostomy: Secondary | ICD-10-CM | POA: Diagnosis not present

## 2024-02-29 DIAGNOSIS — M6281 Muscle weakness (generalized): Secondary | ICD-10-CM | POA: Diagnosis not present

## 2024-02-29 DIAGNOSIS — E1165 Type 2 diabetes mellitus with hyperglycemia: Secondary | ICD-10-CM | POA: Diagnosis not present

## 2024-02-29 DIAGNOSIS — L609 Nail disorder, unspecified: Secondary | ICD-10-CM | POA: Diagnosis not present

## 2024-02-29 DIAGNOSIS — E113211 Type 2 diabetes mellitus with mild nonproliferative diabetic retinopathy with macular edema, right eye: Secondary | ICD-10-CM | POA: Diagnosis not present

## 2024-02-29 DIAGNOSIS — M79672 Pain in left foot: Secondary | ICD-10-CM | POA: Diagnosis not present

## 2024-02-29 DIAGNOSIS — R4189 Other symptoms and signs involving cognitive functions and awareness: Secondary | ICD-10-CM | POA: Diagnosis not present

## 2024-02-29 DIAGNOSIS — I129 Hypertensive chronic kidney disease with stage 1 through stage 4 chronic kidney disease, or unspecified chronic kidney disease: Secondary | ICD-10-CM | POA: Diagnosis not present

## 2024-02-29 DIAGNOSIS — E1169 Type 2 diabetes mellitus with other specified complication: Secondary | ICD-10-CM | POA: Diagnosis not present

## 2024-02-29 DIAGNOSIS — R296 Repeated falls: Secondary | ICD-10-CM | POA: Diagnosis not present

## 2024-02-29 DIAGNOSIS — Z87440 Personal history of urinary (tract) infections: Secondary | ICD-10-CM | POA: Diagnosis not present

## 2024-02-29 DIAGNOSIS — Z8546 Personal history of malignant neoplasm of prostate: Secondary | ICD-10-CM | POA: Diagnosis not present

## 2024-02-29 DIAGNOSIS — Z8616 Personal history of COVID-19: Secondary | ICD-10-CM | POA: Diagnosis not present

## 2024-02-29 DIAGNOSIS — E1140 Type 2 diabetes mellitus with diabetic neuropathy, unspecified: Secondary | ICD-10-CM | POA: Diagnosis not present

## 2024-02-29 DIAGNOSIS — I2510 Atherosclerotic heart disease of native coronary artery without angina pectoris: Secondary | ICD-10-CM | POA: Diagnosis not present

## 2024-02-29 DIAGNOSIS — G4733 Obstructive sleep apnea (adult) (pediatric): Secondary | ICD-10-CM | POA: Diagnosis not present

## 2024-02-29 DIAGNOSIS — Z9181 History of falling: Secondary | ICD-10-CM | POA: Diagnosis not present

## 2024-03-27 DIAGNOSIS — N35919 Unspecified urethral stricture, male, unspecified site: Secondary | ICD-10-CM | POA: Diagnosis not present

## 2024-03-27 DIAGNOSIS — Z466 Encounter for fitting and adjustment of urinary device: Secondary | ICD-10-CM | POA: Diagnosis not present

## 2024-03-27 DIAGNOSIS — I129 Hypertensive chronic kidney disease with stage 1 through stage 4 chronic kidney disease, or unspecified chronic kidney disease: Secondary | ICD-10-CM | POA: Diagnosis not present

## 2024-03-27 DIAGNOSIS — E1122 Type 2 diabetes mellitus with diabetic chronic kidney disease: Secondary | ICD-10-CM | POA: Diagnosis not present

## 2024-03-27 DIAGNOSIS — Z435 Encounter for attention to cystostomy: Secondary | ICD-10-CM | POA: Diagnosis not present

## 2024-03-27 DIAGNOSIS — R296 Repeated falls: Secondary | ICD-10-CM | POA: Diagnosis not present

## 2024-03-30 DIAGNOSIS — E1142 Type 2 diabetes mellitus with diabetic polyneuropathy: Secondary | ICD-10-CM | POA: Diagnosis not present

## 2024-03-30 DIAGNOSIS — I129 Hypertensive chronic kidney disease with stage 1 through stage 4 chronic kidney disease, or unspecified chronic kidney disease: Secondary | ICD-10-CM | POA: Diagnosis not present

## 2024-03-30 DIAGNOSIS — E785 Hyperlipidemia, unspecified: Secondary | ICD-10-CM | POA: Diagnosis not present

## 2024-03-30 DIAGNOSIS — Z794 Long term (current) use of insulin: Secondary | ICD-10-CM | POA: Diagnosis not present

## 2024-03-30 DIAGNOSIS — Z87440 Personal history of urinary (tract) infections: Secondary | ICD-10-CM | POA: Diagnosis not present

## 2024-03-30 DIAGNOSIS — Z435 Encounter for attention to cystostomy: Secondary | ICD-10-CM | POA: Diagnosis not present

## 2024-03-30 DIAGNOSIS — E1122 Type 2 diabetes mellitus with diabetic chronic kidney disease: Secondary | ICD-10-CM | POA: Diagnosis not present

## 2024-03-30 DIAGNOSIS — N35919 Unspecified urethral stricture, male, unspecified site: Secondary | ICD-10-CM | POA: Diagnosis not present

## 2024-03-30 DIAGNOSIS — N1832 Chronic kidney disease, stage 3b: Secondary | ICD-10-CM | POA: Diagnosis not present

## 2024-03-30 DIAGNOSIS — I951 Orthostatic hypotension: Secondary | ICD-10-CM | POA: Diagnosis not present

## 2024-03-30 DIAGNOSIS — Z466 Encounter for fitting and adjustment of urinary device: Secondary | ICD-10-CM | POA: Diagnosis not present

## 2024-03-30 DIAGNOSIS — R4189 Other symptoms and signs involving cognitive functions and awareness: Secondary | ICD-10-CM | POA: Diagnosis not present

## 2024-03-30 DIAGNOSIS — E1165 Type 2 diabetes mellitus with hyperglycemia: Secondary | ICD-10-CM | POA: Diagnosis not present

## 2024-03-30 DIAGNOSIS — Z9181 History of falling: Secondary | ICD-10-CM | POA: Diagnosis not present

## 2024-03-30 DIAGNOSIS — Z8673 Personal history of transient ischemic attack (TIA), and cerebral infarction without residual deficits: Secondary | ICD-10-CM | POA: Diagnosis not present

## 2024-03-30 DIAGNOSIS — Z8616 Personal history of COVID-19: Secondary | ICD-10-CM | POA: Diagnosis not present

## 2024-03-30 DIAGNOSIS — G4733 Obstructive sleep apnea (adult) (pediatric): Secondary | ICD-10-CM | POA: Diagnosis not present

## 2024-03-30 DIAGNOSIS — R296 Repeated falls: Secondary | ICD-10-CM | POA: Diagnosis not present

## 2024-03-30 DIAGNOSIS — Z8546 Personal history of malignant neoplasm of prostate: Secondary | ICD-10-CM | POA: Diagnosis not present

## 2024-03-30 DIAGNOSIS — I2510 Atherosclerotic heart disease of native coronary artery without angina pectoris: Secondary | ICD-10-CM | POA: Diagnosis not present

## 2024-03-30 DIAGNOSIS — E113211 Type 2 diabetes mellitus with mild nonproliferative diabetic retinopathy with macular edema, right eye: Secondary | ICD-10-CM | POA: Diagnosis not present

## 2024-04-06 ENCOUNTER — Other Ambulatory Visit: Payer: Self-pay | Admitting: Family Medicine

## 2024-04-09 ENCOUNTER — Other Ambulatory Visit: Payer: Self-pay | Admitting: Family Medicine

## 2024-04-09 ENCOUNTER — Other Ambulatory Visit: Payer: Self-pay

## 2024-04-09 MED ORDER — AMLODIPINE BESYLATE 5 MG PO TABS: 5.0000 mg | ORAL_TABLET | Freq: Every day | ORAL | 5 refills | Status: DC

## 2024-04-16 DIAGNOSIS — H353211 Exudative age-related macular degeneration, right eye, with active choroidal neovascularization: Secondary | ICD-10-CM | POA: Diagnosis not present

## 2024-04-24 DIAGNOSIS — N35919 Unspecified urethral stricture, male, unspecified site: Secondary | ICD-10-CM | POA: Diagnosis not present

## 2024-04-24 DIAGNOSIS — Z466 Encounter for fitting and adjustment of urinary device: Secondary | ICD-10-CM | POA: Diagnosis not present

## 2024-04-24 DIAGNOSIS — R296 Repeated falls: Secondary | ICD-10-CM | POA: Diagnosis not present

## 2024-04-24 DIAGNOSIS — E1122 Type 2 diabetes mellitus with diabetic chronic kidney disease: Secondary | ICD-10-CM | POA: Diagnosis not present

## 2024-04-24 DIAGNOSIS — I129 Hypertensive chronic kidney disease with stage 1 through stage 4 chronic kidney disease, or unspecified chronic kidney disease: Secondary | ICD-10-CM | POA: Diagnosis not present

## 2024-04-24 DIAGNOSIS — Z435 Encounter for attention to cystostomy: Secondary | ICD-10-CM | POA: Diagnosis not present

## 2024-04-29 DIAGNOSIS — Z466 Encounter for fitting and adjustment of urinary device: Secondary | ICD-10-CM | POA: Diagnosis not present

## 2024-04-29 DIAGNOSIS — G4733 Obstructive sleep apnea (adult) (pediatric): Secondary | ICD-10-CM | POA: Diagnosis not present

## 2024-04-29 DIAGNOSIS — E113211 Type 2 diabetes mellitus with mild nonproliferative diabetic retinopathy with macular edema, right eye: Secondary | ICD-10-CM | POA: Diagnosis not present

## 2024-04-29 DIAGNOSIS — E1142 Type 2 diabetes mellitus with diabetic polyneuropathy: Secondary | ICD-10-CM | POA: Diagnosis not present

## 2024-04-29 DIAGNOSIS — Z9181 History of falling: Secondary | ICD-10-CM | POA: Diagnosis not present

## 2024-04-29 DIAGNOSIS — I129 Hypertensive chronic kidney disease with stage 1 through stage 4 chronic kidney disease, or unspecified chronic kidney disease: Secondary | ICD-10-CM | POA: Diagnosis not present

## 2024-04-29 DIAGNOSIS — N35919 Unspecified urethral stricture, male, unspecified site: Secondary | ICD-10-CM | POA: Diagnosis not present

## 2024-04-29 DIAGNOSIS — I951 Orthostatic hypotension: Secondary | ICD-10-CM | POA: Diagnosis not present

## 2024-04-29 DIAGNOSIS — Z435 Encounter for attention to cystostomy: Secondary | ICD-10-CM | POA: Diagnosis not present

## 2024-04-29 DIAGNOSIS — E785 Hyperlipidemia, unspecified: Secondary | ICD-10-CM | POA: Diagnosis not present

## 2024-04-29 DIAGNOSIS — Z794 Long term (current) use of insulin: Secondary | ICD-10-CM | POA: Diagnosis not present

## 2024-04-29 DIAGNOSIS — R4189 Other symptoms and signs involving cognitive functions and awareness: Secondary | ICD-10-CM | POA: Diagnosis not present

## 2024-04-29 DIAGNOSIS — Z8546 Personal history of malignant neoplasm of prostate: Secondary | ICD-10-CM | POA: Diagnosis not present

## 2024-04-29 DIAGNOSIS — Z8616 Personal history of COVID-19: Secondary | ICD-10-CM | POA: Diagnosis not present

## 2024-04-29 DIAGNOSIS — I2510 Atherosclerotic heart disease of native coronary artery without angina pectoris: Secondary | ICD-10-CM | POA: Diagnosis not present

## 2024-04-29 DIAGNOSIS — R296 Repeated falls: Secondary | ICD-10-CM | POA: Diagnosis not present

## 2024-04-29 DIAGNOSIS — Z8673 Personal history of transient ischemic attack (TIA), and cerebral infarction without residual deficits: Secondary | ICD-10-CM | POA: Diagnosis not present

## 2024-04-29 DIAGNOSIS — Z87440 Personal history of urinary (tract) infections: Secondary | ICD-10-CM | POA: Diagnosis not present

## 2024-04-29 DIAGNOSIS — E1165 Type 2 diabetes mellitus with hyperglycemia: Secondary | ICD-10-CM | POA: Diagnosis not present

## 2024-04-29 DIAGNOSIS — E1122 Type 2 diabetes mellitus with diabetic chronic kidney disease: Secondary | ICD-10-CM | POA: Diagnosis not present

## 2024-04-29 DIAGNOSIS — N1832 Chronic kidney disease, stage 3b: Secondary | ICD-10-CM | POA: Diagnosis not present

## 2024-05-04 ENCOUNTER — Other Ambulatory Visit: Payer: Self-pay | Admitting: Family Medicine

## 2024-05-09 DIAGNOSIS — M79672 Pain in left foot: Secondary | ICD-10-CM | POA: Diagnosis not present

## 2024-05-09 DIAGNOSIS — L609 Nail disorder, unspecified: Secondary | ICD-10-CM | POA: Diagnosis not present

## 2024-05-09 DIAGNOSIS — E1140 Type 2 diabetes mellitus with diabetic neuropathy, unspecified: Secondary | ICD-10-CM | POA: Diagnosis not present

## 2024-05-09 DIAGNOSIS — M79671 Pain in right foot: Secondary | ICD-10-CM | POA: Diagnosis not present

## 2024-05-21 ENCOUNTER — Ambulatory Visit: Payer: Self-pay

## 2024-05-21 ENCOUNTER — Encounter: Payer: Self-pay | Admitting: Family Medicine

## 2024-05-21 ENCOUNTER — Ambulatory Visit (INDEPENDENT_AMBULATORY_CARE_PROVIDER_SITE_OTHER): Admitting: Family Medicine

## 2024-05-21 VITALS — BP 146/80 | HR 43 | Temp 98.4°F | Ht 74.0 in | Wt 164.0 lb

## 2024-05-21 DIAGNOSIS — M79672 Pain in left foot: Secondary | ICD-10-CM | POA: Diagnosis not present

## 2024-05-21 DIAGNOSIS — Z9189 Other specified personal risk factors, not elsewhere classified: Secondary | ICD-10-CM

## 2024-05-21 DIAGNOSIS — E11621 Type 2 diabetes mellitus with foot ulcer: Secondary | ICD-10-CM

## 2024-05-21 DIAGNOSIS — E119 Type 2 diabetes mellitus without complications: Secondary | ICD-10-CM

## 2024-05-21 DIAGNOSIS — L97521 Non-pressure chronic ulcer of other part of left foot limited to breakdown of skin: Secondary | ICD-10-CM

## 2024-05-21 MED ORDER — MUPIROCIN 2 % EX OINT: 1.0000 | TOPICAL_OINTMENT | Freq: Two times a day (BID) | CUTANEOUS | 0 refills | Status: DC

## 2024-05-21 MED ORDER — CEPHALEXIN 500 MG PO CAPS: 500.0000 mg | ORAL_CAPSULE | Freq: Three times a day (TID) | ORAL | 0 refills | Status: DC

## 2024-05-21 NOTE — Telephone Encounter (Signed)
 FYI Only or Action Required?: FYI only for provider  Patient was last seen in primary care on 02/23/2024 by Bennet Brasil, MD. Called Nurse Triage reporting Foot sore. Symptoms began several days ago. Interventions attempted: OTC medications: Neosporin ointment. Symptoms are: gradually worsening.  Triage Disposition: See Physician Within 24 Hours  Patient/caregiver understands and will follow disposition?: Yes        Copied from CRM [id number]. Topic: Clinical - Red Word Triage >> May 21, 2024  8:23 AM Georgeann Kindred wrote: Red Word that prompted transfer to Nurse Triage: Wife, Fausto Hooker, Called regarding a spot that on patient's left foot that provider stated to keep an eye on. The spot has gotten worse and has turned into a sore. Patient is a diabetic and sees Dr. Fairy Homer at [facility]. Reason for Disposition  [1] Ulcer, wound, blister, sore, or red area AND [2] new or increasing  Answer Assessment - Initial Assessment Questions 1. SYMPTOM: What's the main symptom you're concerned about? (e.g., rash, sore, callus, drainage, numbness)     Sore on Left foot getting   2. LOCATION: Where is the  sore located? (e.g., foot/toe, top/bottom, left/right)     Middle of left foot on top of foot  3. APPEARANCE: What does the area look like? (e.g., normal, red, swollen; size)     Red, opening, size of a dime  4. ONSET: When did the  sore  start?     Started to turn red and development into a sore last week  5. PAIN: Is there any pain? If Yes, ask: How bad is it? (Scale: 1-10; mild, moderate, severe)     No pain  6. CAUSE: What do you think is causing the symptoms?     Diabetes  7. OTHER SYMPTOMS: Do you have any other symptoms? (e.g., fever, weakness)     No other symptoms  Treated with neosporin and placing a band-aid on it. Not sure if that made it worse, but continued to spray antibiotic on it and keep it covered  Protocols used: Diabetes - Foot Problems and  Questions-A-AH

## 2024-05-21 NOTE — Progress Notes (Signed)
   Subjective:    Patient ID: Luke Solis, male    DOB: May 06, 1931, 89 y.o.   MRN: [id number]  HPI left foot ulcer- worsening  Patient has ulcer on top of his foot been present for several weeks seems to be getting worse causing some discomfort but no severe pain states that sugars been under decent control lately.  Denies any major setbacks Able to walk    Review of Systems     Objective:   Physical Exam Top of the foot on the left side has a small ulcerated area there is no loss of skin it does not appear to be deep it appears to be more like a first-degree blister with some redness pulses are difficult to feel calf is normal ankle normal       Assessment & Plan:  Diabetic foot ulcer Recommend antibiotics Also Bactroban  twice daily Also ABI Recheck within the next couple weeks with the PA to consult with me if any issues problems or if not healing

## 2024-05-25 ENCOUNTER — Ambulatory Visit (HOSPITAL_COMMUNITY)
Admission: RE | Admit: 2024-05-25 | Discharge: 2024-05-25 | Disposition: A | Discharge status: Home / Self Care | Source: Ambulatory Visit | Attending: Family Medicine | Admitting: Family Medicine

## 2024-05-25 DIAGNOSIS — E11621 Type 2 diabetes mellitus with foot ulcer: Secondary | ICD-10-CM | POA: Diagnosis not present

## 2024-05-25 DIAGNOSIS — N35919 Unspecified urethral stricture, male, unspecified site: Secondary | ICD-10-CM | POA: Diagnosis not present

## 2024-05-25 DIAGNOSIS — E1122 Type 2 diabetes mellitus with diabetic chronic kidney disease: Secondary | ICD-10-CM | POA: Diagnosis not present

## 2024-05-25 DIAGNOSIS — E119 Type 2 diabetes mellitus without complications: Secondary | ICD-10-CM | POA: Diagnosis not present

## 2024-05-25 DIAGNOSIS — Z9189 Other specified personal risk factors, not elsewhere classified: Secondary | ICD-10-CM | POA: Diagnosis not present

## 2024-05-25 DIAGNOSIS — Z435 Encounter for attention to cystostomy: Secondary | ICD-10-CM | POA: Diagnosis not present

## 2024-05-25 DIAGNOSIS — R296 Repeated falls: Secondary | ICD-10-CM | POA: Diagnosis not present

## 2024-05-25 DIAGNOSIS — Z466 Encounter for fitting and adjustment of urinary device: Secondary | ICD-10-CM | POA: Diagnosis not present

## 2024-05-25 DIAGNOSIS — I10 Essential (primary) hypertension: Secondary | ICD-10-CM | POA: Diagnosis not present

## 2024-05-25 DIAGNOSIS — E1151 Type 2 diabetes mellitus with diabetic peripheral angiopathy without gangrene: Secondary | ICD-10-CM | POA: Diagnosis not present

## 2024-05-25 DIAGNOSIS — M79672 Pain in left foot: Secondary | ICD-10-CM | POA: Diagnosis not present

## 2024-05-25 DIAGNOSIS — L97529 Non-pressure chronic ulcer of other part of left foot with unspecified severity: Secondary | ICD-10-CM | POA: Diagnosis not present

## 2024-05-25 DIAGNOSIS — I129 Hypertensive chronic kidney disease with stage 1 through stage 4 chronic kidney disease, or unspecified chronic kidney disease: Secondary | ICD-10-CM | POA: Diagnosis not present

## 2024-05-26 ENCOUNTER — Ambulatory Visit: Payer: Self-pay | Admitting: Family Medicine

## 2024-05-28 ENCOUNTER — Ambulatory Visit: Payer: Self-pay

## 2024-05-28 DIAGNOSIS — Z466 Encounter for fitting and adjustment of urinary device: Secondary | ICD-10-CM | POA: Diagnosis not present

## 2024-05-28 DIAGNOSIS — Z435 Encounter for attention to cystostomy: Secondary | ICD-10-CM | POA: Diagnosis not present

## 2024-05-28 DIAGNOSIS — E1122 Type 2 diabetes mellitus with diabetic chronic kidney disease: Secondary | ICD-10-CM | POA: Diagnosis not present

## 2024-05-28 DIAGNOSIS — I129 Hypertensive chronic kidney disease with stage 1 through stage 4 chronic kidney disease, or unspecified chronic kidney disease: Secondary | ICD-10-CM | POA: Diagnosis not present

## 2024-05-28 DIAGNOSIS — N35919 Unspecified urethral stricture, male, unspecified site: Secondary | ICD-10-CM | POA: Diagnosis not present

## 2024-05-28 DIAGNOSIS — R296 Repeated falls: Secondary | ICD-10-CM | POA: Diagnosis not present

## 2024-05-28 NOTE — Telephone Encounter (Signed)
 Copied from CRM [id number]. Topic: Clinical - Red Word Triage >> May 28, 2024  4:47 PM Everette C wrote: [facility] that prompted transfer to Nurse Triage: Jay with [facility] is calling with concerns of penile pain for the patient, Sydnie is currently with the patient conducting a home health visit    Reason for Disposition  All other penis - scrotum symptoms  (Exception: Painless rash < 24 hours duration.)  Answer Assessment - Initial Assessment Questions 1. SYMPTOM: What's the main symptom you're concerned about? (e.g., discharge from penis, rash, pain, itching, swelling)     Penis pain  2. LOCATION: Where is the pain located?     At the catheter site  3. ONSET: When did pain start?     3 days ago 4. PAIN: Is there any pain? If Yes, ask: How bad is it?  (Scale 1-10; or mild, moderate, severe)     Mild  5. URINE: Any difficulty passing urine? If Yes, ask: When was the last time?     N/A 6. CAUSE: What do you think is causing the symptoms?     Possible UTI or due to catheter  7. OTHER SYMPTOMS: Do you have any other symptoms? (e.g., fever, abdomen pain, blood in urine)     No    Patient has declined an appointment and would like to wait until he is reevaluated by [facility]. Patient will be seen by [facility] again on 6/26, who would like an order for a urine sample to check the patient for a UTI.  Protocols used: Penis and Scrotum Symptoms-A-AH    FYI Only or Action Required?: Action required by provider: request for urinalysis lab order.  Patient was last seen in primary care on 05/21/2024 by Alphonsa Glendia LABOR, MD. Called Nurse Triage reporting Penis Pain. Symptoms began 3 days ago. Interventions attempted: Nothing. Symptoms are: unchanged.  Triage Disposition: See PCP When Office is Open (Within 3 Days)  Patient/caregiver understands and will follow disposition?: No, wishes to speak with PCP

## 2024-05-29 ENCOUNTER — Other Ambulatory Visit: Payer: Self-pay

## 2024-05-29 DIAGNOSIS — I951 Orthostatic hypotension: Secondary | ICD-10-CM | POA: Diagnosis not present

## 2024-05-29 DIAGNOSIS — I129 Hypertensive chronic kidney disease with stage 1 through stage 4 chronic kidney disease, or unspecified chronic kidney disease: Secondary | ICD-10-CM | POA: Diagnosis not present

## 2024-05-29 DIAGNOSIS — L97521 Non-pressure chronic ulcer of other part of left foot limited to breakdown of skin: Secondary | ICD-10-CM | POA: Diagnosis not present

## 2024-05-29 DIAGNOSIS — E1142 Type 2 diabetes mellitus with diabetic polyneuropathy: Secondary | ICD-10-CM | POA: Diagnosis not present

## 2024-05-29 DIAGNOSIS — R3989 Other symptoms and signs involving the genitourinary system: Secondary | ICD-10-CM

## 2024-05-29 DIAGNOSIS — R300 Dysuria: Secondary | ICD-10-CM

## 2024-05-29 DIAGNOSIS — R4189 Other symptoms and signs involving cognitive functions and awareness: Secondary | ICD-10-CM | POA: Diagnosis not present

## 2024-05-29 DIAGNOSIS — N35919 Unspecified urethral stricture, male, unspecified site: Secondary | ICD-10-CM | POA: Diagnosis not present

## 2024-05-29 DIAGNOSIS — Z466 Encounter for fitting and adjustment of urinary device: Secondary | ICD-10-CM | POA: Diagnosis not present

## 2024-05-29 DIAGNOSIS — R296 Repeated falls: Secondary | ICD-10-CM | POA: Diagnosis not present

## 2024-05-29 DIAGNOSIS — Z87440 Personal history of urinary (tract) infections: Secondary | ICD-10-CM | POA: Diagnosis not present

## 2024-05-29 DIAGNOSIS — I2510 Atherosclerotic heart disease of native coronary artery without angina pectoris: Secondary | ICD-10-CM | POA: Diagnosis not present

## 2024-05-29 DIAGNOSIS — Z8673 Personal history of transient ischemic attack (TIA), and cerebral infarction without residual deficits: Secondary | ICD-10-CM | POA: Diagnosis not present

## 2024-05-29 DIAGNOSIS — Z435 Encounter for attention to cystostomy: Secondary | ICD-10-CM | POA: Diagnosis not present

## 2024-05-29 DIAGNOSIS — E113211 Type 2 diabetes mellitus with mild nonproliferative diabetic retinopathy with macular edema, right eye: Secondary | ICD-10-CM | POA: Diagnosis not present

## 2024-05-29 DIAGNOSIS — E11621 Type 2 diabetes mellitus with foot ulcer: Secondary | ICD-10-CM | POA: Diagnosis not present

## 2024-05-29 DIAGNOSIS — G4733 Obstructive sleep apnea (adult) (pediatric): Secondary | ICD-10-CM | POA: Diagnosis not present

## 2024-05-29 DIAGNOSIS — G8929 Other chronic pain: Secondary | ICD-10-CM

## 2024-05-29 DIAGNOSIS — Z8546 Personal history of malignant neoplasm of prostate: Secondary | ICD-10-CM | POA: Diagnosis not present

## 2024-05-29 DIAGNOSIS — E785 Hyperlipidemia, unspecified: Secondary | ICD-10-CM | POA: Diagnosis not present

## 2024-05-29 DIAGNOSIS — Z794 Long term (current) use of insulin: Secondary | ICD-10-CM | POA: Diagnosis not present

## 2024-05-29 DIAGNOSIS — Z8616 Personal history of COVID-19: Secondary | ICD-10-CM | POA: Diagnosis not present

## 2024-05-29 DIAGNOSIS — N1832 Chronic kidney disease, stage 3b: Secondary | ICD-10-CM | POA: Diagnosis not present

## 2024-05-29 DIAGNOSIS — E1165 Type 2 diabetes mellitus with hyperglycemia: Secondary | ICD-10-CM | POA: Diagnosis not present

## 2024-05-29 DIAGNOSIS — Z9181 History of falling: Secondary | ICD-10-CM | POA: Diagnosis not present

## 2024-05-29 DIAGNOSIS — E1122 Type 2 diabetes mellitus with diabetic chronic kidney disease: Secondary | ICD-10-CM | POA: Diagnosis not present

## 2024-05-29 NOTE — Telephone Encounter (Signed)
 Urine/ urine culture ordered

## 2024-05-29 NOTE — Telephone Encounter (Signed)
 More than likely infection could be causing this It is also strongly possible he can have positive culture due to indwelling catheter so it may or may not be abuse May order UA and urine culture.

## 2024-05-30 ENCOUNTER — Other Ambulatory Visit: Payer: Self-pay | Admitting: Family Medicine

## 2024-05-30 DIAGNOSIS — R296 Repeated falls: Secondary | ICD-10-CM | POA: Diagnosis not present

## 2024-05-30 DIAGNOSIS — Z466 Encounter for fitting and adjustment of urinary device: Secondary | ICD-10-CM | POA: Diagnosis not present

## 2024-05-30 DIAGNOSIS — Z435 Encounter for attention to cystostomy: Secondary | ICD-10-CM | POA: Diagnosis not present

## 2024-05-30 DIAGNOSIS — N35919 Unspecified urethral stricture, male, unspecified site: Secondary | ICD-10-CM | POA: Diagnosis not present

## 2024-05-30 DIAGNOSIS — L97521 Non-pressure chronic ulcer of other part of left foot limited to breakdown of skin: Secondary | ICD-10-CM | POA: Diagnosis not present

## 2024-05-30 DIAGNOSIS — E11621 Type 2 diabetes mellitus with foot ulcer: Secondary | ICD-10-CM | POA: Diagnosis not present

## 2024-06-04 ENCOUNTER — Encounter: Payer: Self-pay | Admitting: Physician Assistant

## 2024-06-04 ENCOUNTER — Ambulatory Visit: Admitting: Physician Assistant

## 2024-06-04 VITALS — BP 138/71 | HR 66 | Temp 98.4°F | Ht 74.0 in | Wt 160.0 lb

## 2024-06-04 DIAGNOSIS — E11621 Type 2 diabetes mellitus with foot ulcer: Secondary | ICD-10-CM | POA: Diagnosis not present

## 2024-06-04 DIAGNOSIS — L97421 Non-pressure chronic ulcer of left heel and midfoot limited to breakdown of skin: Secondary | ICD-10-CM

## 2024-06-04 NOTE — Progress Notes (Signed)
   Established Patient Office Visit  Subjective   Patient ID: Luke Solis, male    DOB: February 14, 1931  Age: 89 y.o. MRN: [id number]  No chief complaint on file.   Patient presents today for follow up regarding diabetic foot ulcer. Recently finished course of antibiotics and is using topical Bactroban . States great improvement in wound. Denies worsening symptoms. Denies erythema, wound discharge, or fevers. He has no further complaints today.      Review of Systems  Constitutional:  Negative for chills, fever and malaise/fatigue.  Musculoskeletal:  Negative for myalgias.  Skin:  Negative for itching and rash.      Objective:     BP 138/71   Pulse 66   Temp 98.4 F (36.9 C)   Ht 6' 2 (1.88 m)   Wt 160 lb (72.6 kg)   SpO2 98%   BMI 20.54 kg/m    Physical Exam Constitutional:      Appearance: Normal appearance. He is normal weight.  HENT:     Head: Normocephalic and atraumatic.   Cardiovascular:     Rate and Rhythm: Normal rate and regular rhythm.     Heart sounds: Normal heart sounds.  Pulmonary:     Effort: Pulmonary effort is normal.     Breath sounds: Normal breath sounds.  Feet:     Right foot:     Skin integrity: Skin integrity normal.     Left foot:     Skin integrity: Ulcer (almost healed ulcer on dorsal aspectt of foot) present. No skin breakdown or erythema.   Skin:    General: Skin is warm and dry.     Coloration: Skin is not pale.     Findings: No erythema.   Neurological:     Mental Status: He is alert.     No results found for any visits on 06/04/24.  The ASCVD Risk score (Arnett DK, et al., 2019) failed to calculate for the following reasons:   The 2019 ASCVD risk score is only valid for ages 51 to 42   Risk score cannot be calculated because patient has a medical history suggesting prior/existing ASCVD    Assessment & Plan:   Return for previously scheduled appt .   Diabetic ulcer of left midfoot associated with type 2 diabetes  mellitus, limited to breakdown of skin [facility]) Assessment & Plan: Much improved since visit with Dr. Glendia 2 weeks ago. Advised continued use of Bactroban . May continue to bandage while wearing shoes, okay to keep uncovered when at home. Follow up for worsening symptoms such as erythema, pain, swelling, or new fevers. Otherwise patient to follow up in August as previously scheduled.     Charmaine Jahon Bart, PA-C

## 2024-06-04 NOTE — Assessment & Plan Note (Signed)
 Much improved since visit with Dr. Glendia 2 weeks ago. Advised continued use of Bactroban . May continue to bandage while wearing shoes, okay to keep uncovered when at home. Follow up for worsening symptoms such as erythema, pain, swelling, or new fevers. Otherwise patient to follow up in August as previously scheduled.

## 2024-06-06 DIAGNOSIS — N1832 Chronic kidney disease, stage 3b: Secondary | ICD-10-CM | POA: Diagnosis not present

## 2024-06-06 DIAGNOSIS — E11621 Type 2 diabetes mellitus with foot ulcer: Secondary | ICD-10-CM | POA: Diagnosis not present

## 2024-06-06 DIAGNOSIS — I129 Hypertensive chronic kidney disease with stage 1 through stage 4 chronic kidney disease, or unspecified chronic kidney disease: Secondary | ICD-10-CM | POA: Diagnosis not present

## 2024-06-06 DIAGNOSIS — Z466 Encounter for fitting and adjustment of urinary device: Secondary | ICD-10-CM | POA: Diagnosis not present

## 2024-06-06 DIAGNOSIS — Z435 Encounter for attention to cystostomy: Secondary | ICD-10-CM | POA: Diagnosis not present

## 2024-06-06 DIAGNOSIS — E1122 Type 2 diabetes mellitus with diabetic chronic kidney disease: Secondary | ICD-10-CM | POA: Diagnosis not present

## 2024-06-06 DIAGNOSIS — N35919 Unspecified urethral stricture, male, unspecified site: Secondary | ICD-10-CM | POA: Diagnosis not present

## 2024-06-06 DIAGNOSIS — R296 Repeated falls: Secondary | ICD-10-CM | POA: Diagnosis not present

## 2024-06-06 DIAGNOSIS — E1142 Type 2 diabetes mellitus with diabetic polyneuropathy: Secondary | ICD-10-CM | POA: Diagnosis not present

## 2024-06-06 DIAGNOSIS — L97521 Non-pressure chronic ulcer of other part of left foot limited to breakdown of skin: Secondary | ICD-10-CM | POA: Diagnosis not present

## 2024-06-06 DIAGNOSIS — I2510 Atherosclerotic heart disease of native coronary artery without angina pectoris: Secondary | ICD-10-CM | POA: Diagnosis not present

## 2024-06-06 DIAGNOSIS — I951 Orthostatic hypotension: Secondary | ICD-10-CM | POA: Diagnosis not present

## 2024-06-13 DIAGNOSIS — L97521 Non-pressure chronic ulcer of other part of left foot limited to breakdown of skin: Secondary | ICD-10-CM | POA: Diagnosis not present

## 2024-06-13 DIAGNOSIS — Z435 Encounter for attention to cystostomy: Secondary | ICD-10-CM | POA: Diagnosis not present

## 2024-06-13 DIAGNOSIS — N35919 Unspecified urethral stricture, male, unspecified site: Secondary | ICD-10-CM | POA: Diagnosis not present

## 2024-06-13 DIAGNOSIS — Z466 Encounter for fitting and adjustment of urinary device: Secondary | ICD-10-CM | POA: Diagnosis not present

## 2024-06-13 DIAGNOSIS — R296 Repeated falls: Secondary | ICD-10-CM | POA: Diagnosis not present

## 2024-06-13 DIAGNOSIS — E11621 Type 2 diabetes mellitus with foot ulcer: Secondary | ICD-10-CM | POA: Diagnosis not present

## 2024-06-25 DIAGNOSIS — H353211 Exudative age-related macular degeneration, right eye, with active choroidal neovascularization: Secondary | ICD-10-CM | POA: Diagnosis not present

## 2024-06-27 ENCOUNTER — Other Ambulatory Visit: Payer: Self-pay | Admitting: Family Medicine

## 2024-06-27 DIAGNOSIS — E11621 Type 2 diabetes mellitus with foot ulcer: Secondary | ICD-10-CM | POA: Diagnosis not present

## 2024-06-27 DIAGNOSIS — R296 Repeated falls: Secondary | ICD-10-CM | POA: Diagnosis not present

## 2024-06-27 DIAGNOSIS — Z466 Encounter for fitting and adjustment of urinary device: Secondary | ICD-10-CM | POA: Diagnosis not present

## 2024-06-27 DIAGNOSIS — N35919 Unspecified urethral stricture, male, unspecified site: Secondary | ICD-10-CM | POA: Diagnosis not present

## 2024-06-27 DIAGNOSIS — Z435 Encounter for attention to cystostomy: Secondary | ICD-10-CM | POA: Diagnosis not present

## 2024-06-27 DIAGNOSIS — L97521 Non-pressure chronic ulcer of other part of left foot limited to breakdown of skin: Secondary | ICD-10-CM | POA: Diagnosis not present

## 2024-06-28 ENCOUNTER — Other Ambulatory Visit: Payer: Self-pay | Admitting: Family Medicine

## 2024-06-28 DIAGNOSIS — E11621 Type 2 diabetes mellitus with foot ulcer: Secondary | ICD-10-CM | POA: Diagnosis not present

## 2024-06-28 DIAGNOSIS — Z8616 Personal history of COVID-19: Secondary | ICD-10-CM | POA: Diagnosis not present

## 2024-06-28 DIAGNOSIS — Z87440 Personal history of urinary (tract) infections: Secondary | ICD-10-CM | POA: Diagnosis not present

## 2024-06-28 DIAGNOSIS — Z794 Long term (current) use of insulin: Secondary | ICD-10-CM | POA: Diagnosis not present

## 2024-06-28 DIAGNOSIS — E1142 Type 2 diabetes mellitus with diabetic polyneuropathy: Secondary | ICD-10-CM | POA: Diagnosis not present

## 2024-06-28 DIAGNOSIS — R4189 Other symptoms and signs involving cognitive functions and awareness: Secondary | ICD-10-CM | POA: Diagnosis not present

## 2024-06-28 DIAGNOSIS — I2510 Atherosclerotic heart disease of native coronary artery without angina pectoris: Secondary | ICD-10-CM | POA: Diagnosis not present

## 2024-06-28 DIAGNOSIS — G4733 Obstructive sleep apnea (adult) (pediatric): Secondary | ICD-10-CM | POA: Diagnosis not present

## 2024-06-28 DIAGNOSIS — E1165 Type 2 diabetes mellitus with hyperglycemia: Secondary | ICD-10-CM | POA: Diagnosis not present

## 2024-06-28 DIAGNOSIS — I951 Orthostatic hypotension: Secondary | ICD-10-CM | POA: Diagnosis not present

## 2024-06-28 DIAGNOSIS — Z8546 Personal history of malignant neoplasm of prostate: Secondary | ICD-10-CM | POA: Diagnosis not present

## 2024-06-28 DIAGNOSIS — Z435 Encounter for attention to cystostomy: Secondary | ICD-10-CM | POA: Diagnosis not present

## 2024-06-28 DIAGNOSIS — N1832 Chronic kidney disease, stage 3b: Secondary | ICD-10-CM | POA: Diagnosis not present

## 2024-06-28 DIAGNOSIS — N35919 Unspecified urethral stricture, male, unspecified site: Secondary | ICD-10-CM | POA: Diagnosis not present

## 2024-06-28 DIAGNOSIS — Z8673 Personal history of transient ischemic attack (TIA), and cerebral infarction without residual deficits: Secondary | ICD-10-CM | POA: Diagnosis not present

## 2024-06-28 DIAGNOSIS — I129 Hypertensive chronic kidney disease with stage 1 through stage 4 chronic kidney disease, or unspecified chronic kidney disease: Secondary | ICD-10-CM | POA: Diagnosis not present

## 2024-06-28 DIAGNOSIS — E113211 Type 2 diabetes mellitus with mild nonproliferative diabetic retinopathy with macular edema, right eye: Secondary | ICD-10-CM | POA: Diagnosis not present

## 2024-06-28 DIAGNOSIS — E1122 Type 2 diabetes mellitus with diabetic chronic kidney disease: Secondary | ICD-10-CM | POA: Diagnosis not present

## 2024-06-28 DIAGNOSIS — Z466 Encounter for fitting and adjustment of urinary device: Secondary | ICD-10-CM | POA: Diagnosis not present

## 2024-06-28 DIAGNOSIS — L97521 Non-pressure chronic ulcer of other part of left foot limited to breakdown of skin: Secondary | ICD-10-CM | POA: Diagnosis not present

## 2024-06-28 DIAGNOSIS — E785 Hyperlipidemia, unspecified: Secondary | ICD-10-CM | POA: Diagnosis not present

## 2024-06-28 DIAGNOSIS — Z9181 History of falling: Secondary | ICD-10-CM | POA: Diagnosis not present

## 2024-06-28 DIAGNOSIS — R296 Repeated falls: Secondary | ICD-10-CM | POA: Diagnosis not present

## 2024-06-29 ENCOUNTER — Other Ambulatory Visit: Payer: Self-pay

## 2024-06-29 ENCOUNTER — Other Ambulatory Visit: Payer: Self-pay | Admitting: Family Medicine

## 2024-06-29 MED ORDER — LOSARTAN POTASSIUM 25 MG PO TABS: 25.0000 mg | ORAL_TABLET | Freq: Every day | ORAL | 5 refills | Status: DC

## 2024-06-29 MED ORDER — PREGABALIN 25 MG PO CAPS: ORAL_CAPSULE | ORAL | 5 refills | Status: DC

## 2024-07-24 DIAGNOSIS — C61 Malignant neoplasm of prostate: Secondary | ICD-10-CM | POA: Diagnosis not present

## 2024-07-24 DIAGNOSIS — M79671 Pain in right foot: Secondary | ICD-10-CM | POA: Diagnosis not present

## 2024-07-24 DIAGNOSIS — Z794 Long term (current) use of insulin: Secondary | ICD-10-CM | POA: Diagnosis not present

## 2024-07-24 DIAGNOSIS — L97521 Non-pressure chronic ulcer of other part of left foot limited to breakdown of skin: Secondary | ICD-10-CM | POA: Diagnosis not present

## 2024-07-24 DIAGNOSIS — M79672 Pain in left foot: Secondary | ICD-10-CM | POA: Diagnosis not present

## 2024-07-24 DIAGNOSIS — R296 Repeated falls: Secondary | ICD-10-CM | POA: Diagnosis not present

## 2024-07-24 DIAGNOSIS — Z435 Encounter for attention to cystostomy: Secondary | ICD-10-CM | POA: Diagnosis not present

## 2024-07-24 DIAGNOSIS — N183 Chronic kidney disease, stage 3 unspecified: Secondary | ICD-10-CM | POA: Diagnosis not present

## 2024-07-24 DIAGNOSIS — E1140 Type 2 diabetes mellitus with diabetic neuropathy, unspecified: Secondary | ICD-10-CM | POA: Diagnosis not present

## 2024-07-24 DIAGNOSIS — L609 Nail disorder, unspecified: Secondary | ICD-10-CM | POA: Diagnosis not present

## 2024-07-24 DIAGNOSIS — E785 Hyperlipidemia, unspecified: Secondary | ICD-10-CM | POA: Diagnosis not present

## 2024-07-24 DIAGNOSIS — E11621 Type 2 diabetes mellitus with foot ulcer: Secondary | ICD-10-CM | POA: Diagnosis not present

## 2024-07-24 DIAGNOSIS — Z466 Encounter for fitting and adjustment of urinary device: Secondary | ICD-10-CM | POA: Diagnosis not present

## 2024-07-24 DIAGNOSIS — N35919 Unspecified urethral stricture, male, unspecified site: Secondary | ICD-10-CM | POA: Diagnosis not present

## 2024-07-25 ENCOUNTER — Encounter: Payer: Self-pay | Admitting: Family Medicine

## 2024-07-25 ENCOUNTER — Ambulatory Visit: Payer: Self-pay | Admitting: Family Medicine

## 2024-07-25 ENCOUNTER — Ambulatory Visit: Admitting: Family Medicine

## 2024-07-25 ENCOUNTER — Other Ambulatory Visit: Payer: Self-pay | Admitting: Family Medicine

## 2024-07-25 VITALS — BP 124/78 | HR 64 | Temp 98.1°F | Ht 74.0 in | Wt 165.0 lb

## 2024-07-25 DIAGNOSIS — Z9359 Other cystostomy status: Secondary | ICD-10-CM | POA: Diagnosis not present

## 2024-07-25 DIAGNOSIS — Z794 Long term (current) use of insulin: Secondary | ICD-10-CM

## 2024-07-25 DIAGNOSIS — N183 Chronic kidney disease, stage 3 unspecified: Secondary | ICD-10-CM

## 2024-07-25 DIAGNOSIS — G8929 Other chronic pain: Secondary | ICD-10-CM

## 2024-07-25 DIAGNOSIS — N4889 Other specified disorders of penis: Secondary | ICD-10-CM

## 2024-07-25 DIAGNOSIS — E1140 Type 2 diabetes mellitus with diabetic neuropathy, unspecified: Secondary | ICD-10-CM

## 2024-07-25 LAB — LIPID PANEL
Chol/HDL Ratio: 3.3 ratio (ref 0.0–5.0)
Cholesterol, Total: 166 mg/dL (ref 100–199)
HDL: 50 mg/dL (ref >39)
LDL Chol Calc (NIH): 94 mg/dL (ref 0–99)
Triglycerides: 125 mg/dL (ref 0–149)
VLDL Cholesterol Cal: 22 mg/dL (ref 5–40)

## 2024-07-25 LAB — BASIC METABOLIC PANEL WITH GFR
BUN/Creatinine Ratio: 19 (ref 10–24)
BUN: 27 mg/dL (ref 10–36)
CO2: 24 mmol/L (ref 20–29)
Calcium: 9.3 mg/dL (ref 8.6–10.2)
Chloride: 107 mmol/L — ABNORMAL HIGH (ref 96–106)
Creatinine, Ser: 1.39 mg/dL — ABNORMAL HIGH (ref 0.76–1.27)
Glucose: 130 mg/dL — ABNORMAL HIGH (ref 70–99)
Potassium: 4 mmol/L (ref 3.5–5.2)
Sodium: 145 mmol/L — ABNORMAL HIGH (ref 134–144)
eGFR: 48 mL/min/1.73 — ABNORMAL LOW (ref >59)

## 2024-07-25 LAB — HEMOGLOBIN A1C
Est. average glucose Bld gHb Est-mCnc: 163 mg/dL
Hgb A1c MFr Bld: 7.3 % — ABNORMAL HIGH (ref 4.8–5.6)

## 2024-07-25 LAB — PSA: Prostate Specific Ag, Serum: 2.6 ng/mL (ref 0.0–4.0)

## 2024-07-25 NOTE — Progress Notes (Addendum)
   Subjective:    Patient ID: Luke Solis, male    DOB: [birth year], 89 y.o.   MRN: [id number]  HPI 5 month f/u  Sore of foot unchanged  fatigue DM2 Hypertension CKD3 Hyperlipidemia Prostate Cancer  Patient has a history of prostate cancer.  Because of this he has significant scarring of the urethra area and is unable to void this that minimal amounts.  He has a indwelling suprapubic catheter.  It is changed on a regular basis by home health.  They changed it yesterday.  Typically after changing the catheter he has penile pain which will last 1 to 2 days he relates penile pain today.  He states he did have some chills yesterday but no chills today and no fevers.  He relates his energy level is subpar compared to normal but this has been the case for several weeks denies any vomiting denies any rectal bleeding.  Patient also here today for his chronic health problems including diabetes he states his sugars been running anywhere between 130 and 170.  Denies any low sugar spells.  Patient is on Lyrica  and takes his other medications on a regular basis as well.  Review of Systems     Objective:   Physical Exam Lungs are clear hearts regular extremities no edema skin warm dry blood pressure stable sitting and standing Diabetic ulcer on the top of the left foot is well-healing with no ulceration      Assessment & Plan:  1. Penile pain, chronic (Primary) Patient has had this pain before when he has a suprapubic catheter change.  Patient not having any fever currently.  I told him if he starts having fever chills severe pain or other problems this would need to get checked out further  2. Stage 3 chronic kidney disease, unspecified whether stage 3a or 3b CKD (HCC) Stable lab findings on blood work.  No need to add additional medicines currently  3. Type 2 diabetes mellitus with diabetic neuropathy, with long-term current use of insulin  (HCC) A1c is reasonable at 7.3 continue current  measures  4. Suprapubic catheter [facility]) Home health changes catheter on a regular basis  Addendum-patient called back after hours stating that he has had onset of abdominal pain and discomfort that does not want to go away he states he abdominal pain is severe.  Unfortunately there is no way to know for certain the cause of this without running test given that the pain is severe-he relates it is as bad as he is ever had it-there appears to be no other alternative other than to send him to the ER for further evaluation-ER doctor was spoken with  Second addendum-family called back 25 minutes later stating that patient had a bowel movement and the pain was alleviated EMS came and checked the patient out and told him that it was not necessary to go to the ER unless he really felt like he needed to therefore family would just watch out going and will give us  feedback tomorrow if he has reoccurrence of pain we will do further workup

## 2024-07-27 ENCOUNTER — Other Ambulatory Visit: Payer: Self-pay | Admitting: Family Medicine

## 2024-07-27 DIAGNOSIS — E1140 Type 2 diabetes mellitus with diabetic neuropathy, unspecified: Secondary | ICD-10-CM

## 2024-07-28 DIAGNOSIS — R4189 Other symptoms and signs involving cognitive functions and awareness: Secondary | ICD-10-CM | POA: Diagnosis not present

## 2024-07-28 DIAGNOSIS — E1165 Type 2 diabetes mellitus with hyperglycemia: Secondary | ICD-10-CM | POA: Diagnosis not present

## 2024-07-28 DIAGNOSIS — Z466 Encounter for fitting and adjustment of urinary device: Secondary | ICD-10-CM | POA: Diagnosis not present

## 2024-07-28 DIAGNOSIS — R296 Repeated falls: Secondary | ICD-10-CM | POA: Diagnosis not present

## 2024-07-28 DIAGNOSIS — I2510 Atherosclerotic heart disease of native coronary artery without angina pectoris: Secondary | ICD-10-CM | POA: Diagnosis not present

## 2024-07-28 DIAGNOSIS — E1122 Type 2 diabetes mellitus with diabetic chronic kidney disease: Secondary | ICD-10-CM | POA: Diagnosis not present

## 2024-07-28 DIAGNOSIS — E785 Hyperlipidemia, unspecified: Secondary | ICD-10-CM | POA: Diagnosis not present

## 2024-07-28 DIAGNOSIS — Z87440 Personal history of urinary (tract) infections: Secondary | ICD-10-CM | POA: Diagnosis not present

## 2024-07-28 DIAGNOSIS — I129 Hypertensive chronic kidney disease with stage 1 through stage 4 chronic kidney disease, or unspecified chronic kidney disease: Secondary | ICD-10-CM | POA: Diagnosis not present

## 2024-07-28 DIAGNOSIS — Z8673 Personal history of transient ischemic attack (TIA), and cerebral infarction without residual deficits: Secondary | ICD-10-CM | POA: Diagnosis not present

## 2024-07-28 DIAGNOSIS — Z9181 History of falling: Secondary | ICD-10-CM | POA: Diagnosis not present

## 2024-07-28 DIAGNOSIS — G4733 Obstructive sleep apnea (adult) (pediatric): Secondary | ICD-10-CM | POA: Diagnosis not present

## 2024-07-28 DIAGNOSIS — Z794 Long term (current) use of insulin: Secondary | ICD-10-CM | POA: Diagnosis not present

## 2024-07-28 DIAGNOSIS — Z8546 Personal history of malignant neoplasm of prostate: Secondary | ICD-10-CM | POA: Diagnosis not present

## 2024-07-28 DIAGNOSIS — E1142 Type 2 diabetes mellitus with diabetic polyneuropathy: Secondary | ICD-10-CM | POA: Diagnosis not present

## 2024-07-28 DIAGNOSIS — Z8616 Personal history of COVID-19: Secondary | ICD-10-CM | POA: Diagnosis not present

## 2024-07-28 DIAGNOSIS — N35919 Unspecified urethral stricture, male, unspecified site: Secondary | ICD-10-CM | POA: Diagnosis not present

## 2024-07-28 DIAGNOSIS — I951 Orthostatic hypotension: Secondary | ICD-10-CM | POA: Diagnosis not present

## 2024-07-28 DIAGNOSIS — N1832 Chronic kidney disease, stage 3b: Secondary | ICD-10-CM | POA: Diagnosis not present

## 2024-07-28 DIAGNOSIS — E113211 Type 2 diabetes mellitus with mild nonproliferative diabetic retinopathy with macular edema, right eye: Secondary | ICD-10-CM | POA: Diagnosis not present

## 2024-07-28 DIAGNOSIS — Z435 Encounter for attention to cystostomy: Secondary | ICD-10-CM | POA: Diagnosis not present

## 2024-07-31 DIAGNOSIS — I129 Hypertensive chronic kidney disease with stage 1 through stage 4 chronic kidney disease, or unspecified chronic kidney disease: Secondary | ICD-10-CM | POA: Diagnosis not present

## 2024-07-31 DIAGNOSIS — N35919 Unspecified urethral stricture, male, unspecified site: Secondary | ICD-10-CM | POA: Diagnosis not present

## 2024-07-31 DIAGNOSIS — E1122 Type 2 diabetes mellitus with diabetic chronic kidney disease: Secondary | ICD-10-CM | POA: Diagnosis not present

## 2024-07-31 DIAGNOSIS — N1832 Chronic kidney disease, stage 3b: Secondary | ICD-10-CM | POA: Diagnosis not present

## 2024-07-31 DIAGNOSIS — Z435 Encounter for attention to cystostomy: Secondary | ICD-10-CM | POA: Diagnosis not present

## 2024-07-31 DIAGNOSIS — Z466 Encounter for fitting and adjustment of urinary device: Secondary | ICD-10-CM | POA: Diagnosis not present

## 2024-08-01 ENCOUNTER — Other Ambulatory Visit: Payer: Self-pay | Admitting: Family Medicine

## 2024-08-01 ENCOUNTER — Ambulatory Visit: Payer: Self-pay

## 2024-08-01 ENCOUNTER — Other Ambulatory Visit: Payer: Self-pay

## 2024-08-01 ENCOUNTER — Telehealth: Payer: Self-pay | Admitting: Family Medicine

## 2024-08-01 DIAGNOSIS — N1832 Chronic kidney disease, stage 3b: Secondary | ICD-10-CM

## 2024-08-01 DIAGNOSIS — Z9359 Other cystostomy status: Secondary | ICD-10-CM

## 2024-08-01 DIAGNOSIS — R102 Pelvic and perineal pain: Secondary | ICD-10-CM

## 2024-08-01 MED ORDER — CEPHALEXIN 500 MG PO CAPS: 500.0000 mg | ORAL_CAPSULE | Freq: Three times a day (TID) | ORAL | 0 refills | Status: DC

## 2024-08-01 NOTE — Telephone Encounter (Signed)
 FYI Only or Action Required?: Action required by provider: Advised ED for penis pain/suprapubic catheter.  Patient was last seen in primary care on 07/25/2024 by Alphonsa Glendia LABOR, MD.  Called Nurse Triage reporting painful urination.  Symptoms began several days ago.  Interventions attempted: Other: called Home Health agency.  Symptoms are: unchanged.  Triage Disposition: See HCP Within 4 Hours (Or PCP Triage)  Patient/caregiver understands and will follow disposition?: Yes      Copied from CRM #[id number]. Topic: Clinical - Red Word Triage >> Aug 01, 2024  9:38 AM Treva T wrote: [facility] that prompted transfer to Nurse Triage: Received call from patient spouse, Beverely, states patient has been having pain from his catheter. Per spouse unsure if it is a UTI or something else. Has difficulty with urination. Reason for Disposition  [1] SEVERE pain with urination (e.g., excruciating) AND [2] not improved after 2 hours of pain medicine (e.g., acetaminophen  or ibuprofen)    Advised ED: Patient having 8/10 since Thursday, decrease in urine output, has suprapubic catheter in place.  Answer Assessment - Initial Assessment Questions Advised ED for penis pain/ suprapubic catheter. Pt's wife reports will take to ED.  Wife reports suprapubic catheter changed last Thursday by [facility] nurse and still having pain. Pain usually last a couple days, this time it hasn't stopped hurting.   Wife reports urine bag has not been very full. Home Health nurse came back yesterday, did not changed the catheter due to risk of infection, drained it and said catheter draining. Wife reports past 24 hours, urine bag 1/2 full this morning, orange color.  1. SEVERITY: How bad is the pain?  (e.g., Scale 1-10; mild, moderate, or severe)     8/10; pt reports only slept for 2 hours last night 2. FREQUENCY: How many times have you had painful urination today?      Constant pain 3. PATTERN: Is pain present every time you  urinate or just sometimes?      Constant pain 4. ONSET: When did the painful urination start?      Last Thursday 5. FEVER: Do you have a fever? If Yes, ask: What is your temperature, how was it measured, and when did it start?     No, pt's wife reports chills with pain 6. PAST UTI: Have you had a urine infection before? If Yes, ask: When was the last time? and What happened that time?      yes 7. CAUSE: What do you think is causing the painful urination?      Happeneded after catheter 8. OTHER SYMPTOMS: Do you have any other symptoms? (e.g., flank pain, penis discharge, scrotal pain, blood in urine)  Only penis pain, denies bleeding, discharge  Denies distention of bladder/lower abdomen or pain, or hardness with palpation, or flank pain  Suprapubic catheter, redness, crusting and small bleeding at the insertion site, no drainage.  Wife reports it can get like this with redness and crusting; cleaned with alcohol.  Protocols used: Urination Pain - Male-A-AH

## 2024-08-01 NOTE — Telephone Encounter (Signed)
 Nurses (Messages being shared with Elveria) The patient has an appointment with El[facility]ia at the end of Friday afternoon He is having intermittent abdominal pain and has a indwelling Foley catheter history of urinary obstruction due to prostate cancer Indwelling suprapubic Foley catheter is permanent He was having some chills intermittently after his catheter change but denies any fevers no nausea vomiting or diarrhea  Please order the following CBC, metabolic 7, liver Also order Stat suprapubic bladder ultrasound Please put on the notation he has a indwelling catheter but now having pain since catheter was changed Ideally this test gets run tomorrow or Friday morning at the latest but it would be preferred to do the test tomorrow He can do the lab test stat through the hospital lab before the ultrasound  Please coordinate this Please call family Home number [phone] Cell number [phone] Sometimes their phones work sometimes they do not Thank you for doing these orders and connecting with the patient

## 2024-08-01 NOTE — Telephone Encounter (Signed)
 Spoke with patient's wife and informed per doctors orders and recommendations. Orders have been placed.

## 2024-08-02 ENCOUNTER — Other Ambulatory Visit: Payer: Self-pay | Admitting: Nurse Practitioner

## 2024-08-02 ENCOUNTER — Ambulatory Visit: Payer: Self-pay | Admitting: Family Medicine

## 2024-08-02 ENCOUNTER — Other Ambulatory Visit (HOSPITAL_COMMUNITY)
Admission: RE | Admit: 2024-08-02 | Discharge: 2024-08-02 | Disposition: A | Discharge status: Home / Self Care | Source: Ambulatory Visit | Attending: Family Medicine | Admitting: Family Medicine

## 2024-08-02 ENCOUNTER — Telehealth: Payer: Self-pay

## 2024-08-02 ENCOUNTER — Other Ambulatory Visit: Payer: Self-pay | Admitting: Family Medicine

## 2024-08-02 ENCOUNTER — Ambulatory Visit (HOSPITAL_COMMUNITY)
Admission: RE | Admit: 2024-08-02 | Discharge: 2024-08-02 | Disposition: A | Discharge status: Home / Self Care | Source: Ambulatory Visit | Attending: Family Medicine | Admitting: Family Medicine

## 2024-08-02 DIAGNOSIS — Z9359 Other cystostomy status: Secondary | ICD-10-CM | POA: Insufficient documentation

## 2024-08-02 DIAGNOSIS — R309 Painful micturition, unspecified: Secondary | ICD-10-CM | POA: Diagnosis not present

## 2024-08-02 DIAGNOSIS — R102 Pelvic and perineal pain: Secondary | ICD-10-CM | POA: Diagnosis not present

## 2024-08-02 LAB — CBC WITH DIFFERENTIAL/PLATELET
Abs Immature Granulocytes: 0.02 K/uL (ref 0.00–0.07)
Basophils Absolute: 0 K/uL (ref 0.0–0.1)
Basophils Relative: 1 %
Eosinophils Absolute: 0.4 K/uL (ref 0.0–0.5)
Eosinophils Relative: 6 %
HCT: 39.9 % (ref 39.0–52.0)
Hemoglobin: 13.5 g/dL (ref 13.0–17.0)
Immature Granulocytes: 0 %
Lymphocytes Relative: 21 %
Lymphs Abs: 1.5 K/uL (ref 0.7–4.0)
MCH: 31.5 pg (ref 26.0–34.0)
MCHC: 33.8 g/dL (ref 30.0–36.0)
MCV: 93 fL (ref 80.0–100.0)
Monocytes Absolute: 0.6 K/uL (ref 0.1–1.0)
Monocytes Relative: 8 %
Neutro Abs: 4.6 K/uL (ref 1.7–7.7)
Neutrophils Relative %: 64 %
Platelets: 214 K/uL (ref 150–400)
RBC: 4.29 MIL/uL (ref 4.22–5.81)
RDW: 12.6 % (ref 11.5–15.5)
WBC: 7.2 K/uL (ref 4.0–10.5)
nRBC: 0 % (ref 0.0–0.2)

## 2024-08-02 LAB — BASIC METABOLIC PANEL WITH GFR
Anion gap: 11 (ref 5–15)
BUN: 27 mg/dL — ABNORMAL HIGH (ref 8–23)
CO2: 24 mmol/L (ref 22–32)
Calcium: 9 mg/dL (ref 8.9–10.3)
Chloride: 104 mmol/L (ref 98–111)
Creatinine, Ser: 1.53 mg/dL — ABNORMAL HIGH (ref 0.61–1.24)
GFR, Estimated: 43 mL/min — ABNORMAL LOW (ref >60)
Glucose, Bld: 168 mg/dL — ABNORMAL HIGH (ref 70–99)
Potassium: 3.5 mmol/L (ref 3.5–5.1)
Sodium: 139 mmol/L (ref 135–145)

## 2024-08-02 LAB — HEPATIC FUNCTION PANEL
ALT: 12 U/L (ref 0–44)
AST: 14 U/L — ABNORMAL LOW (ref 15–41)
Albumin: 3.7 g/dL (ref 3.5–5.0)
Alkaline Phosphatase: 63 U/L (ref 38–126)
Bilirubin, Direct: <0.1 mg/dL (ref 0.0–0.2)
Total Bilirubin: 0.6 mg/dL (ref 0.0–1.2)
Total Protein: 7.2 g/dL (ref 6.5–8.1)

## 2024-08-02 MED ORDER — HYOSCYAMINE SULFATE 0.125 MG PO TABS
0.1250 mg | ORAL_TABLET | ORAL | 0 refills | Status: DC | PRN
Start: 2024-08-02 — End: 2024-09-15

## 2024-08-02 NOTE — Telephone Encounter (Signed)
 I spoke with patient via phone on [phone] Then I communicated to the nurses to order labs and ultrasound Then I communicated with Elveria Quarry who will see the patient Friday afternoon Results are back please see result notes

## 2024-08-02 NOTE — Telephone Encounter (Signed)
 Spoke with patient's wife. Reviewed labs and US  report. No urination during the night. About 4:30 am he got up, noticed no urine then jiggled the tube. In a few hours, got a few hundred cc's of urine. Recently had tube placed. Usually has mild pain at first but this has been going on a week. Says that Dr. Glendia called in antibiotic. Will send in med for bladder spasms to see if this will help. She plans to have home health come out and change the catheter to see if this will help especially considering upcoming holiday weekend. Follow up at appointment tomorrow as planned.

## 2024-08-02 NOTE — Telephone Encounter (Signed)
 Home Health nurse pulled urine sample off left sample in lab

## 2024-08-03 ENCOUNTER — Encounter: Payer: Self-pay | Admitting: Nurse Practitioner

## 2024-08-03 ENCOUNTER — Ambulatory Visit (INDEPENDENT_AMBULATORY_CARE_PROVIDER_SITE_OTHER): Admitting: Nurse Practitioner

## 2024-08-03 VITALS — BP 156/81 | HR 62 | Ht 74.0 in | Wt 164.0 lb

## 2024-08-03 DIAGNOSIS — Z9359 Other cystostomy status: Secondary | ICD-10-CM

## 2024-08-03 DIAGNOSIS — R339 Retention of urine, unspecified: Secondary | ICD-10-CM

## 2024-08-03 NOTE — Progress Notes (Signed)
   Subjective:    Patient ID: Luke Solis, male    DOB: [birth year], 89 y.o.   MRN: [id number]  HPI Discussed the use of AI scribe software for clinical note transcription with the patient, who gave verbal consent to proceed.  History of Present Illness Luke Solis is a 89 year old male with a history of scar tissue affecting catheter placement who presents with issues related to catheter placement and associated pain.  He has been experiencing issues with his catheter, which was not draining properly. A new nurse changed the catheter, which improved the situation, but he experienced significant pain for seven days following the change, which was unusual for him.  He reported that the catheter seemed to back up, causing discomfort. At one point, he had to manipulate the tube to get urine to flow. A different nurse replaced the catheter yesterday, and the pain has greatly improved.   He has been prescribed hyoscyamine  to manage bladder spasms associated with catheter changes. He is also on antibiotics.   No fever and his pain level is now much better, with occasional mild discomfort.     Review of Systems  Respiratory:  Negative for cough, chest tightness and shortness of breath.   Cardiovascular:  Negative for chest pain.  Genitourinary:  Negative for difficulty urinating and dysuria.       Objective:   Physical Exam Vitals and nursing note reviewed. Chaperone present: Wife and daugher are present..  Constitutional:      General: He is not in acute distress. Cardiovascular:     Rate and Rhythm: Normal rate and regular rhythm.  Pulmonary:     Effort: Pulmonary effort is normal.     Breath sounds: Normal breath sounds.  Neurological:     Mental Status: He is alert.  Psychiatric:        Mood and Affect: Mood normal.        Behavior: Behavior normal.        Thought Content: Thought content normal.     Comments: Calm, cheerful affect. No signs of discomfort.      Today's Vitals   08/03/24 1602  BP: (!) 156/81  Pulse: 62  SpO2: 97%  Weight: 164 lb (74.4 kg)  Height: 6' 2 (1.88 m)   Body mass index is 21.06 kg/m.        Assessment & Plan:   Problem List Items Addressed This Visit       Genitourinary   Urinary retention - Primary     Other   Suprapubic catheter (HCC)   Assessment and Plan Assessment & Plan   - Administer hyoscyamine  as needed for bladder spasms, especially before catheter changes. - Ensure catheter changes by experienced nurses to prevent improper placement. - Monitor for catheter blockage or urinary retention, adjust management as needed. - Instruct him to contact clinic if further catheter problems arise.  Return for Routine follow-up with Dr. Glendia as planned.

## 2024-08-04 ENCOUNTER — Encounter: Payer: Self-pay | Admitting: Nurse Practitioner

## 2024-08-21 DIAGNOSIS — I129 Hypertensive chronic kidney disease with stage 1 through stage 4 chronic kidney disease, or unspecified chronic kidney disease: Secondary | ICD-10-CM | POA: Diagnosis not present

## 2024-08-21 DIAGNOSIS — N1832 Chronic kidney disease, stage 3b: Secondary | ICD-10-CM | POA: Diagnosis not present

## 2024-08-21 DIAGNOSIS — Z466 Encounter for fitting and adjustment of urinary device: Secondary | ICD-10-CM | POA: Diagnosis not present

## 2024-08-21 DIAGNOSIS — Z435 Encounter for attention to cystostomy: Secondary | ICD-10-CM | POA: Diagnosis not present

## 2024-08-21 DIAGNOSIS — E1122 Type 2 diabetes mellitus with diabetic chronic kidney disease: Secondary | ICD-10-CM | POA: Diagnosis not present

## 2024-08-21 DIAGNOSIS — N35919 Unspecified urethral stricture, male, unspecified site: Secondary | ICD-10-CM | POA: Diagnosis not present

## 2024-08-22 ENCOUNTER — Other Ambulatory Visit: Payer: Self-pay | Admitting: Family Medicine

## 2024-08-27 DIAGNOSIS — Z794 Long term (current) use of insulin: Secondary | ICD-10-CM | POA: Diagnosis not present

## 2024-08-27 DIAGNOSIS — Z8546 Personal history of malignant neoplasm of prostate: Secondary | ICD-10-CM | POA: Diagnosis not present

## 2024-08-27 DIAGNOSIS — I951 Orthostatic hypotension: Secondary | ICD-10-CM | POA: Diagnosis not present

## 2024-08-27 DIAGNOSIS — R296 Repeated falls: Secondary | ICD-10-CM | POA: Diagnosis not present

## 2024-08-27 DIAGNOSIS — I2510 Atherosclerotic heart disease of native coronary artery without angina pectoris: Secondary | ICD-10-CM | POA: Diagnosis not present

## 2024-08-27 DIAGNOSIS — N35919 Unspecified urethral stricture, male, unspecified site: Secondary | ICD-10-CM | POA: Diagnosis not present

## 2024-08-27 DIAGNOSIS — Z87440 Personal history of urinary (tract) infections: Secondary | ICD-10-CM | POA: Diagnosis not present

## 2024-08-27 DIAGNOSIS — Z8616 Personal history of COVID-19: Secondary | ICD-10-CM | POA: Diagnosis not present

## 2024-08-27 DIAGNOSIS — N1832 Chronic kidney disease, stage 3b: Secondary | ICD-10-CM | POA: Diagnosis not present

## 2024-08-27 DIAGNOSIS — R4189 Other symptoms and signs involving cognitive functions and awareness: Secondary | ICD-10-CM | POA: Diagnosis not present

## 2024-08-27 DIAGNOSIS — I129 Hypertensive chronic kidney disease with stage 1 through stage 4 chronic kidney disease, or unspecified chronic kidney disease: Secondary | ICD-10-CM | POA: Diagnosis not present

## 2024-08-27 DIAGNOSIS — E1142 Type 2 diabetes mellitus with diabetic polyneuropathy: Secondary | ICD-10-CM | POA: Diagnosis not present

## 2024-08-27 DIAGNOSIS — G4733 Obstructive sleep apnea (adult) (pediatric): Secondary | ICD-10-CM | POA: Diagnosis not present

## 2024-08-27 DIAGNOSIS — Z466 Encounter for fitting and adjustment of urinary device: Secondary | ICD-10-CM | POA: Diagnosis not present

## 2024-08-27 DIAGNOSIS — E113211 Type 2 diabetes mellitus with mild nonproliferative diabetic retinopathy with macular edema, right eye: Secondary | ICD-10-CM | POA: Diagnosis not present

## 2024-08-27 DIAGNOSIS — E785 Hyperlipidemia, unspecified: Secondary | ICD-10-CM | POA: Diagnosis not present

## 2024-08-27 DIAGNOSIS — E1122 Type 2 diabetes mellitus with diabetic chronic kidney disease: Secondary | ICD-10-CM | POA: Diagnosis not present

## 2024-08-27 DIAGNOSIS — E1165 Type 2 diabetes mellitus with hyperglycemia: Secondary | ICD-10-CM | POA: Diagnosis not present

## 2024-08-27 DIAGNOSIS — Z435 Encounter for attention to cystostomy: Secondary | ICD-10-CM | POA: Diagnosis not present

## 2024-08-27 DIAGNOSIS — Z9181 History of falling: Secondary | ICD-10-CM | POA: Diagnosis not present

## 2024-08-27 DIAGNOSIS — Z8673 Personal history of transient ischemic attack (TIA), and cerebral infarction without residual deficits: Secondary | ICD-10-CM | POA: Diagnosis not present

## 2024-08-28 ENCOUNTER — Other Ambulatory Visit: Payer: Self-pay | Admitting: Family Medicine

## 2024-08-29 ENCOUNTER — Telehealth: Payer: Self-pay

## 2024-08-29 NOTE — Telephone Encounter (Signed)
 Form dropped off by [facility] that needs to be signed placed in red folder up front

## 2024-08-30 ENCOUNTER — Telehealth: Payer: Self-pay | Admitting: Family Medicine

## 2024-08-30 ENCOUNTER — Other Ambulatory Visit: Payer: Self-pay

## 2024-08-30 MED ORDER — ROSUVASTATIN CALCIUM 5 MG PO TABS: 5.0000 mg | ORAL_TABLET | Freq: Every day | ORAL | 0 refills | Status: DC

## 2024-08-30 NOTE — Telephone Encounter (Signed)
 Refill on rosuvastatin  (CRESTOR ) 5 MG tablet    potassium chloride  (KLOR-CON ) 10 MEQ tablet   [facility]

## 2024-08-30 NOTE — Telephone Encounter (Signed)
 I have signed all the forms that were in my office thank you

## 2024-08-30 NOTE — Telephone Encounter (Signed)
 Nurses I would not recommend additional potassium after he finishes what he has I would recommend a repeat metabolic 7 with magnesium  approximately 7 to 10 days after finishing potassium Please keep all regular follow-up visits If any questions concerns or issues let us  know

## 2024-08-31 NOTE — Telephone Encounter (Signed)
Mailbox is not currently accepting messages

## 2024-08-31 NOTE — Telephone Encounter (Signed)
Patient has been informed per provider recommendations.

## 2024-09-03 DIAGNOSIS — H353211 Exudative age-related macular degeneration, right eye, with active choroidal neovascularization: Secondary | ICD-10-CM | POA: Diagnosis not present

## 2024-09-03 DIAGNOSIS — H353122 Nonexudative age-related macular degeneration, left eye, intermediate dry stage: Secondary | ICD-10-CM | POA: Diagnosis not present

## 2024-09-03 DIAGNOSIS — H43813 Vitreous degeneration, bilateral: Secondary | ICD-10-CM | POA: Diagnosis not present

## 2024-09-03 DIAGNOSIS — E119 Type 2 diabetes mellitus without complications: Secondary | ICD-10-CM | POA: Diagnosis not present

## 2024-09-03 DIAGNOSIS — H35033 Hypertensive retinopathy, bilateral: Secondary | ICD-10-CM | POA: Diagnosis not present

## 2024-09-04 DIAGNOSIS — E1122 Type 2 diabetes mellitus with diabetic chronic kidney disease: Secondary | ICD-10-CM | POA: Diagnosis not present

## 2024-09-04 DIAGNOSIS — Z466 Encounter for fitting and adjustment of urinary device: Secondary | ICD-10-CM | POA: Diagnosis not present

## 2024-09-04 DIAGNOSIS — I129 Hypertensive chronic kidney disease with stage 1 through stage 4 chronic kidney disease, or unspecified chronic kidney disease: Secondary | ICD-10-CM | POA: Diagnosis not present

## 2024-09-04 DIAGNOSIS — N35919 Unspecified urethral stricture, male, unspecified site: Secondary | ICD-10-CM | POA: Diagnosis not present

## 2024-09-04 DIAGNOSIS — Z435 Encounter for attention to cystostomy: Secondary | ICD-10-CM | POA: Diagnosis not present

## 2024-09-04 DIAGNOSIS — N1832 Chronic kidney disease, stage 3b: Secondary | ICD-10-CM | POA: Diagnosis not present

## 2024-09-05 ENCOUNTER — Other Ambulatory Visit: Payer: Self-pay | Admitting: Family Medicine

## 2024-09-05 DIAGNOSIS — E1140 Type 2 diabetes mellitus with diabetic neuropathy, unspecified: Secondary | ICD-10-CM

## 2024-09-11 DIAGNOSIS — Z87448 Personal history of other diseases of urinary system: Secondary | ICD-10-CM | POA: Diagnosis not present

## 2024-09-11 DIAGNOSIS — N3281 Overactive bladder: Secondary | ICD-10-CM | POA: Diagnosis not present

## 2024-09-14 ENCOUNTER — Emergency Department (HOSPITAL_COMMUNITY)

## 2024-09-14 ENCOUNTER — Observation Stay (HOSPITAL_COMMUNITY)
Admission: EM | Admit: 2024-09-14 | Discharge: 2024-10-06 | Disposition: E | Attending: Internal Medicine | Admitting: Internal Medicine

## 2024-09-14 DIAGNOSIS — Z79899 Other long term (current) drug therapy: Secondary | ICD-10-CM | POA: Insufficient documentation

## 2024-09-14 DIAGNOSIS — I129 Hypertensive chronic kidney disease with stage 1 through stage 4 chronic kidney disease, or unspecified chronic kidney disease: Secondary | ICD-10-CM | POA: Diagnosis not present

## 2024-09-14 DIAGNOSIS — Z8546 Personal history of malignant neoplasm of prostate: Secondary | ICD-10-CM | POA: Insufficient documentation

## 2024-09-14 DIAGNOSIS — Z4682 Encounter for fitting and adjustment of non-vascular catheter: Secondary | ICD-10-CM | POA: Diagnosis not present

## 2024-09-14 DIAGNOSIS — Z8673 Personal history of transient ischemic attack (TIA), and cerebral infarction without residual deficits: Secondary | ICD-10-CM | POA: Insufficient documentation

## 2024-09-14 DIAGNOSIS — I4901 Ventricular fibrillation: Secondary | ICD-10-CM | POA: Diagnosis not present

## 2024-09-14 DIAGNOSIS — Z96659 Presence of unspecified artificial knee joint: Secondary | ICD-10-CM | POA: Insufficient documentation

## 2024-09-14 DIAGNOSIS — I2510 Atherosclerotic heart disease of native coronary artery without angina pectoris: Secondary | ICD-10-CM | POA: Diagnosis not present

## 2024-09-14 DIAGNOSIS — N3040 Irradiation cystitis without hematuria: Secondary | ICD-10-CM | POA: Diagnosis not present

## 2024-09-14 DIAGNOSIS — I10 Essential (primary) hypertension: Secondary | ICD-10-CM | POA: Diagnosis not present

## 2024-09-14 DIAGNOSIS — I2119 ST elevation (STEMI) myocardial infarction involving other coronary artery of inferior wall: Secondary | ICD-10-CM | POA: Diagnosis not present

## 2024-09-14 DIAGNOSIS — Z515 Encounter for palliative care: Secondary | ICD-10-CM | POA: Diagnosis not present

## 2024-09-14 DIAGNOSIS — E1165 Type 2 diabetes mellitus with hyperglycemia: Secondary | ICD-10-CM | POA: Diagnosis present

## 2024-09-14 DIAGNOSIS — Z8616 Personal history of COVID-19: Secondary | ICD-10-CM | POA: Diagnosis not present

## 2024-09-14 DIAGNOSIS — Z743 Need for continuous supervision: Secondary | ICD-10-CM | POA: Diagnosis not present

## 2024-09-14 DIAGNOSIS — Z9911 Dependence on respirator [ventilator] status: Secondary | ICD-10-CM | POA: Diagnosis not present

## 2024-09-14 DIAGNOSIS — E1142 Type 2 diabetes mellitus with diabetic polyneuropathy: Secondary | ICD-10-CM | POA: Diagnosis not present

## 2024-09-14 DIAGNOSIS — E785 Hyperlipidemia, unspecified: Secondary | ICD-10-CM | POA: Diagnosis not present

## 2024-09-14 DIAGNOSIS — Z955 Presence of coronary angioplasty implant and graft: Secondary | ICD-10-CM | POA: Insufficient documentation

## 2024-09-14 DIAGNOSIS — Z794 Long term (current) use of insulin: Secondary | ICD-10-CM | POA: Insufficient documentation

## 2024-09-14 DIAGNOSIS — D631 Anemia in chronic kidney disease: Secondary | ICD-10-CM | POA: Insufficient documentation

## 2024-09-14 DIAGNOSIS — G4733 Obstructive sleep apnea (adult) (pediatric): Secondary | ICD-10-CM | POA: Diagnosis not present

## 2024-09-14 DIAGNOSIS — R918 Other nonspecific abnormal finding of lung field: Secondary | ICD-10-CM | POA: Diagnosis not present

## 2024-09-14 DIAGNOSIS — C61 Malignant neoplasm of prostate: Secondary | ICD-10-CM | POA: Diagnosis not present

## 2024-09-14 DIAGNOSIS — D649 Anemia, unspecified: Secondary | ICD-10-CM | POA: Diagnosis present

## 2024-09-14 DIAGNOSIS — I219 Acute myocardial infarction, unspecified: Secondary | ICD-10-CM | POA: Insufficient documentation

## 2024-09-14 DIAGNOSIS — I469 Cardiac arrest, cause unspecified: Principal | ICD-10-CM | POA: Diagnosis present

## 2024-09-14 DIAGNOSIS — N1832 Chronic kidney disease, stage 3b: Secondary | ICD-10-CM | POA: Diagnosis not present

## 2024-09-14 DIAGNOSIS — R001 Bradycardia, unspecified: Secondary | ICD-10-CM | POA: Diagnosis not present

## 2024-09-14 DIAGNOSIS — E1122 Type 2 diabetes mellitus with diabetic chronic kidney disease: Secondary | ICD-10-CM | POA: Insufficient documentation

## 2024-09-14 LAB — I-STAT CHEM 8, ED
BUN: 26 mg/dL — ABNORMAL HIGH (ref 8–23)
Calcium, Ion: 1.06 mmol/L — ABNORMAL LOW (ref 1.15–1.40)
Chloride: 111 mmol/L (ref 98–111)
Creatinine, Ser: 1.8 mg/dL — ABNORMAL HIGH (ref 0.61–1.24)
Glucose, Bld: 274 mg/dL — ABNORMAL HIGH (ref 70–99)
HCT: 36 % — ABNORMAL LOW (ref 39.0–52.0)
Hemoglobin: 12.2 g/dL — ABNORMAL LOW (ref 13.0–17.0)
Potassium: 3 mmol/L — ABNORMAL LOW (ref 3.5–5.1)
Sodium: 145 mmol/L (ref 135–145)
TCO2: 17 mmol/L — ABNORMAL LOW (ref 22–32)

## 2024-09-14 LAB — COMPREHENSIVE METABOLIC PANEL WITH GFR
ALT: 93 U/L — ABNORMAL HIGH (ref 0–44)
AST: 107 U/L — ABNORMAL HIGH (ref 15–41)
Albumin: 2.9 g/dL — ABNORMAL LOW (ref 3.5–5.0)
Alkaline Phosphatase: 70 U/L (ref 38–126)
Anion gap: 20 — ABNORMAL HIGH (ref 5–15)
BUN: 21 mg/dL (ref 8–23)
CO2: 16 mmol/L — ABNORMAL LOW (ref 22–32)
Calcium: 7.9 mg/dL — ABNORMAL LOW (ref 8.9–10.3)
Chloride: 108 mmol/L (ref 98–111)
Creatinine, Ser: 1.78 mg/dL — ABNORMAL HIGH (ref 0.61–1.24)
GFR, Estimated: 36 mL/min — ABNORMAL LOW (ref >60)
Glucose, Bld: 269 mg/dL — ABNORMAL HIGH (ref 70–99)
Potassium: 2.9 mmol/L — ABNORMAL LOW (ref 3.5–5.1)
Sodium: 144 mmol/L (ref 135–145)
Total Bilirubin: 0.7 mg/dL (ref 0.0–1.2)
Total Protein: 5.9 g/dL — ABNORMAL LOW (ref 6.5–8.1)

## 2024-09-14 LAB — TYPE AND SCREEN
ABO/RH(D): A POS
Antibody Screen: NEGATIVE

## 2024-09-14 LAB — CBC
HCT: 40.2 % (ref 39.0–52.0)
Hemoglobin: 12.6 g/dL — ABNORMAL LOW (ref 13.0–17.0)
MCH: 30.6 pg (ref 26.0–34.0)
MCHC: 31.3 g/dL (ref 30.0–36.0)
MCV: 97.6 fL (ref 80.0–100.0)
Platelets: 211 K/uL (ref 150–400)
RBC: 4.12 MIL/uL — ABNORMAL LOW (ref 4.22–5.81)
RDW: 12.3 % (ref 11.5–15.5)
WBC: 32.6 K/uL — ABNORMAL HIGH (ref 4.0–10.5)
nRBC: 0 % (ref 0.0–0.2)

## 2024-09-14 LAB — I-STAT CG4 LACTIC ACID, ED: Lactic Acid, Venous: 9.9 mmol/L (ref 0.5–1.9)

## 2024-09-14 LAB — TROPONIN I (HIGH SENSITIVITY): Troponin I (High Sensitivity): 1093 ng/L (ref <18)

## 2024-09-14 LAB — APTT: aPTT: 33 s (ref 24–36)

## 2024-09-14 LAB — PROTIME-INR
INR: 1.1 (ref 0.8–1.2)
Prothrombin Time: 15.3 s — ABNORMAL HIGH (ref 11.4–15.2)

## 2024-09-14 LAB — TRIGLYCERIDES: Triglycerides: 97 mg/dL (ref <150)

## 2024-09-14 LAB — PHOSPHORUS: Phosphorus: 6.8 mg/dL — ABNORMAL HIGH (ref 2.5–4.6)

## 2024-09-14 LAB — MAGNESIUM: Magnesium: 2.4 mg/dL (ref 1.7–2.4)

## 2024-09-14 MED ORDER — GLYCOPYRROLATE 1 MG PO TABS: 1.0000 mg | ORAL_TABLET | ORAL | Status: DC | PRN

## 2024-09-14 MED ORDER — HALOPERIDOL 1 MG PO TABS: 0.5000 mg | ORAL_TABLET | ORAL | Status: DC | PRN

## 2024-09-14 MED ORDER — SODIUM CHLORIDE 0.9% FLUSH: 3.0000 mL | Freq: Two times a day (BID) | INTRAVENOUS | Status: DC

## 2024-09-14 MED ORDER — MORPHINE SULFATE (PF) 4 MG/ML IV SOLN: 4.0000 mg | Freq: Once | INTRAVENOUS | Status: DC

## 2024-09-14 MED ORDER — ACETAMINOPHEN 325 MG PO TABS: 650.0000 mg | ORAL_TABLET | Freq: Four times a day (QID) | ORAL | Status: DC | PRN

## 2024-09-14 MED ORDER — MORPHINE 100MG IN NS 100ML (1MG/ML) PREMIX INFUSION
0.0000 mg/h | INTRAVENOUS | Status: DC
  Administered 2024-09-14: 5 mg/h via INTRAVENOUS
  Filled 2024-09-14: qty 100

## 2024-09-14 MED ORDER — EPINEPHRINE HCL 5 MG/250ML IV SOLN IN NS
0.5000 ug/min | INTRAVENOUS | Status: DC
  Administered 2024-09-14: 20 ug/min via INTRAVENOUS

## 2024-09-14 MED ORDER — SODIUM CHLORIDE 0.9 % IV SOLN: 250.0000 mL | INTRAVENOUS | Status: DC

## 2024-09-14 MED ORDER — FENTANYL 2500MCG IN NS 250ML (10MCG/ML) PREMIX INFUSION: 0.0000 ug/h | INTRAVENOUS | Status: DC

## 2024-09-14 MED ORDER — LORAZEPAM 2 MG/ML IJ SOLN: 2.0000 mg | INTRAMUSCULAR | Status: DC | PRN

## 2024-09-14 MED ORDER — FENTANYL BOLUS VIA INFUSION: 25.0000 ug | INTRAVENOUS | Status: DC | PRN

## 2024-09-14 MED ORDER — ACETAMINOPHEN 650 MG RE SUPP: 650.0000 mg | Freq: Four times a day (QID) | RECTAL | Status: DC | PRN

## 2024-09-14 MED ORDER — LORAZEPAM 2 MG/ML IJ SOLN: 1.0000 mg | INTRAMUSCULAR | Status: DC | PRN

## 2024-09-14 MED ORDER — POLYVINYL ALCOHOL 1.4 % OP SOLN: 1.0000 [drp] | Freq: Four times a day (QID) | OPHTHALMIC | Status: DC | PRN

## 2024-09-14 MED ORDER — LACTATED RINGERS IV BOLUS
1000.0000 mL | Freq: Once | INTRAVENOUS | Status: AC
  Administered 2024-09-14: 1000 mL via INTRAVENOUS

## 2024-09-14 MED ORDER — HALOPERIDOL LACTATE 2 MG/ML PO CONC: 0.5000 mg | ORAL | Status: DC | PRN

## 2024-09-14 MED ORDER — FENTANYL CITRATE (PF) 50 MCG/ML IJ SOSY: 50.0000 ug | PREFILLED_SYRINGE | INTRAMUSCULAR | Status: DC | PRN

## 2024-09-14 MED ORDER — GLYCOPYRROLATE 0.2 MG/ML IJ SOLN: 0.2000 mg | INTRAMUSCULAR | Status: DC | PRN

## 2024-09-14 MED ORDER — MORPHINE SULFATE (PF) 4 MG/ML IV SOLN
4.0000 mg | INTRAVENOUS | Status: DC | PRN
  Administered 2024-09-14: 4 mg via INTRAVENOUS
  Filled 2024-09-14: qty 1

## 2024-09-14 MED ORDER — HALOPERIDOL LACTATE 5 MG/ML IJ SOLN: 0.5000 mg | INTRAMUSCULAR | Status: DC | PRN

## 2024-09-14 MED ORDER — MORPHINE BOLUS VIA INFUSION: 5.0000 mg | INTRAVENOUS | Status: DC | PRN

## 2024-09-14 MED ORDER — MORPHINE 100MG IN NS 100ML (1MG/ML) PREMIX INFUSION
1.0000 mg/h | INTRAVENOUS | Status: DC
  Administered 2024-09-14: 10 mg/h via INTRAVENOUS
  Filled 2024-09-14: qty 100

## 2024-09-14 MED ORDER — CHLORHEXIDINE GLUCONATE CLOTH 2 % EX PADS: 6.0000 | MEDICATED_PAD | Freq: Every day | CUTANEOUS | Status: DC

## 2024-09-14 MED ORDER — SODIUM CHLORIDE 0.9 % IV SOLN
INTRAVENOUS | Status: DC
Start: 2024-09-14 — End: 2024-09-15

## 2024-09-14 MED ORDER — LORAZEPAM 2 MG/ML PO CONC: 1.0000 mg | ORAL | Status: DC | PRN

## 2024-09-14 MED ORDER — LORAZEPAM 1 MG PO TABS: 1.0000 mg | ORAL_TABLET | ORAL | Status: DC | PRN

## 2024-09-14 MED ORDER — FENTANYL CITRATE (PF) 50 MCG/ML IJ SOSY: 25.0000 ug | PREFILLED_SYRINGE | Freq: Once | INTRAMUSCULAR | Status: DC

## 2024-09-14 MED ORDER — FENTANYL CITRATE (PF) 50 MCG/ML IJ SOSY
50.0000 ug | PREFILLED_SYRINGE | INTRAMUSCULAR | Status: DC | PRN
  Administered 2024-09-14: 50 ug via INTRAVENOUS
  Filled 2024-09-14: qty 1

## 2024-09-14 MED ORDER — SENNA 8.6 MG PO TABS: 1.0000 | ORAL_TABLET | Freq: Every evening | ORAL | Status: DC | PRN

## 2024-09-14 NOTE — ED Provider Notes (Signed)
 Pt has been admitted by hospitalist for comfort care   Cleotilde Rogue, MD 09/14/24 [phone]

## 2024-09-14 NOTE — Progress Notes (Signed)
 Patient extubated to comfort per written order with RT, RN and MD at bedside. Patient placed on 2L [place] for comfort per MD.

## 2024-09-14 NOTE — ED Notes (Signed)
 Received asystole reading, went to notify nurse, clinical staff was already in room with patient

## 2024-09-14 NOTE — Progress Notes (Signed)
 CSW informed pt now stable for residential hospice.  CSW called [facility], also called weekend contact [place], unable to reach either, messages left requesting contact regarding bed availability for the weekend. Cathlyn Ferry, MSW, LCSW 10/10/20254:08 PM

## 2024-09-14 NOTE — H&P (Addendum)
 History and Physical   Luke Solis FMW:[id number] DOB: 1930-12-11 DOA: 09/14/2024  PCP: Luke Solis LABOR, MD   Patient coming from: Home  Chief Complaint: Cardiac arrest  HPI: Luke Solis is a 89 y.o. male with medical history significant of hypertension, hyperlipidemia, diabetes, CKD 3B, CAD status post stent, anemia, prostate cancer with radiation cystitis and suprapubic catheter, OSA presenting after cardiac arrest.  Reportedly had witnessed cardiac arrest at home.  EMS arrived and patient was in V-fib and received 6 rounds of epinephrine, 7 defibrillation shocks, 450 of amiodarone.  Prolonged resuscitation.  Patient arrived intubated and ventilated.  Patient unable to participate in review of systems.  ED Course: Vital signs initially notable for blood pressure in the 90s with heart rate in the 30s-40s, some improvement in blood pressure and heart rate since that time.  Respiration in the 20s.  Initially received epinephrine, fentanyl , IV fluids.  Later comfort measures were ordered and these were discontinued.  Initial troponin elevated to 1093.  Lab workup notable for CMP with potassium 2.9, bicarb 16, creatinine 1.78, calcium  7.9, protein 5.9, albumin 2.9, AST 107, ALT 93.  CBC with leukocytosis to 32, hemoglobin 12.  PT 15.3.  INR and PTT normal.  Lactic acid 9.9.  UDS, urinalysis, type and screen, blood cultures not obtained.  Phosphorus 6.8.  Magnesium  normal.  Chest x-ray with diffuse opacity representing pulmonary edema versus aspiration.  EKG showed junctional rhythm at 36 bpm,, right bundle branch block, evidence of acute MI.  While in the ED EDP held discussions with family by phone and then in person.  They discussed the severity of his arrest and age and prognosis as well as his known wishes/living will stating no life support and no prolonging measures without expected quality of life.  Decision was made to not pursue further workup with cardiology/cath team and patient was  made comfort care.  Compassionate extubation was performed in the ED and vasopressors were stopped.  Review of Systems: Patient unable to participate in review of systems.  Past Medical History:  Diagnosis Date   Cataract    Coronary atherosclerosis of native coronary artery    BMS proximal and mid RCA as well as BMS circumflex - 2002 (had residual 70% distal LAD), LVEF 60%   COVID    Diabetes mellitus without complication (HCC)    Essential hypertension    Hyperlipidemia    Neuropathy    NSTEMI (non-ST elevated myocardial infarction) (HCC)    2002   Prostate cancer (HCC)    Sleep apnea    On CPAP   Stroke [facility])     Past Surgical History:  Procedure Laterality Date   COLONOSCOPY     EYE SURGERY     INGUINAL HERNIA REPAIR     KNEE SURGERY     Patient states he has never had knee surgery   NOSE SURGERY     PROSTATE SURGERY     TONSILLECTOMY      Social History  reports that he has never smoked. He has never used smokeless tobacco. He reports that he does not drink alcohol and does not use drugs.  Allergies  Allergen Reactions   Broccoli [Brassica Oleracea] Swelling    Patient states causes swelling in throat.   Celery Oil Nausea And Vomiting    Family History  Problem Relation Age of Onset   Heart attack Mother    Diabetes Mother    Coronary artery disease Father    Hypertension Father  Heart attack Father    Stroke Maternal Grandfather    Lung disease Neg Hx    Cancer Neg Hx   Reviewed admission  Prior to Admission medications   Medication Sig Start Date End Date Taking? Authorizing Provider  Vibegron 75 MG TABS Take 1 tablet by mouth daily. 09/11/24  Yes [provider]  amLODipine  (NORVASC ) 5 MG tablet Take 1 tablet (5 mg total) by mouth daily. 04/09/24   Luke Solis LABOR, MD  benzonatate  (TESSALON ) 200 MG capsule Take 1 capsule (200 mg total) by mouth 3 (three) times daily as needed. 08/17/23   Cook, Jayce G, DO  cephALEXin  (KEFLEX ) 500 MG capsule  Take 1 capsule (500 mg total) by mouth 3 (three) times daily. 08/01/24   Luke Solis LABOR, MD  Cholecalciferol  (D3 HIGH POTENCY) 125 MCG (5000 UT) capsule take 1 capsule daily. 08/23/24   Luke Solis LABOR, MD  Continuous Blood Gluc Receiver (FREESTYLE LIBRE 14 DAY READER) DEVI On insulin  shots 4 times a day Dx E11.9 07/01/21   Luke Solis LABOR, MD  Continuous Glucose Sensor (FREESTYLE LIBRE 2 SENSOR) MISC check blood sugar 4 times daily 02/09/24   Luke Solis LABOR, MD  fexofenadine  (ALLEGRA ) 180 MG tablet Take 1 tablet (180 mg total) by mouth daily as needed for allergies. 06/13/21   Vicci Afton CROME, MD  FORACARE PREMIUM V10 TEST test strip USE AS DIRECTED. 07/20/17   Luke Elsie RAMAN, MD  hyoscyamine  (LEVSIN ) 0.125 MG tablet Take 1 tablet (0.125 mg total) by mouth every 4 (four) hours as needed. For bladder spasms 08/02/24   Mauro Coy C, NP  insulin  glargine (LANTUS ) 100 UNIT/ML injection Inject 14 Units into the skin at bedtime. 11/09/23   Luke Solis LABOR, MD  insulin  lispro (HUMALOG  KWIKPEN) 100 UNIT/ML KwikPen sliding scale/101-175-3units/176- 250-4units/251-300-5units /301-350-6units/351-450-7 units/>greater than 450- please call. 09/05/24   Luke Solis LABOR, MD  Insulin  Pen Needle (GLOBAL EASE INJECT PEN NEEDLES) 32G X 4 MM MISC USE TO INJECT LANTUS  EVERY EVENING AND HUMALOG  THREE (3) TIMES DAILY. 03/25/23   Luke Solis LABOR, MD  ketoconazole  (NIZORAL ) 2 % cream APPLY THIN AMOUNT TWICE DAILY TO INFLAMMED AREA OF GENITALS. 02/27/21   Luke Solis LABOR, MD  losartan  (COZAAR ) 25 MG tablet Take 1 tablet (25 mg total) by mouth daily. 06/29/24   Luke Solis LABOR, MD  Multiple Vitamins-Minerals (PRESERVISION AREDS 2) CAPS Take 1 capsule by mouth 2 (two) times daily. 02/22/22   [provider]  mupirocin  ointment (BACTROBAN ) 2 % Apply 1 Application topically 2 (two) times daily. 08/05/22   Amed[facility]ite, Leonna S, FNP  mupirocin  ointment (BACTROBAN ) 2 % Apply 1 Application topically 2 (two) times daily. 05/21/24    Luke Solis LABOR, MD  nitroGLYCERIN  (NITROSTAT ) 0.4 MG SL tablet Place 1 tablet (0.4 mg total) under the tongue every 5 (five) minutes as needed for chest pain. 06/13/21   Johnson, Clanford L, MD  ofloxacin (OCUFLOX) 0.3 % ophthalmic solution  09/24/21   [provider]  potassium chloride  (KLOR-CON ) 10 MEQ tablet take 1 tablet (10 meq total) by mouth daily. 09/03/24   Luke Solis LABOR, MD  pregabalin  (LYRICA ) 25 MG capsule take 1 capsule 2  times daily as needed for diabetic neuropathy. 06/29/24   Luke Solis LABOR, MD  rosuvastatin  (CRESTOR ) 5 MG tablet Take 1 tablet (5 mg total) by mouth daily. 08/30/24   Luke Solis LABOR, MD  Vitamin D , Ergocalciferol , (DRISDOL) 1.25 MG (50000 UNIT) CAPS capsule Take 50,000 Units by mouth every 7 (  seven) days.    [provider]  Zinc Sulfate 220 (50 Zn) MG TABS take 1 tablet once daily. 08/23/24   Luke Solis LABOR, MD    Physical Exam: Vitals:   09/14/24 1430 09/14/24 1633 09/14/24 1656 09/14/24 1733  BP: 121/69  (!) 128/57 130/65  Pulse: 86  78 81  Resp: (!) 25  20 16   Temp:  97.7 F (36.5 C)    TempSrc:  Axillary    SpO2: 97%  95% 92%    Physical Exam Constitutional:      General: He is not in acute distress.    Appearance: He is ill-appearing.  HENT:     Head: Normocephalic and atraumatic.     Mouth/Throat:     Mouth: Mucous membranes are moist.     Pharynx: Oropharynx is clear.  Eyes:     Extraocular Movements: Extraocular movements intact.     Pupils: Pupils are equal, round, and reactive to light.  Cardiovascular:     Rate and Rhythm: Normal rate and regular rhythm.     Pulses: Normal pulses.     Heart sounds: Normal heart sounds.  Pulmonary:     Effort: Pulmonary effort is normal. No respiratory distress.     Breath sounds: Normal breath sounds.  Abdominal:     General: Bowel sounds are normal. There is no distension.     Palpations: Abdomen is soft.     Tenderness: There is no abdominal tenderness.  Musculoskeletal:         General: No swelling or deformity.  Skin:    General: Skin is warm and dry.  Neurological:     Mental Status: He is disoriented.     Comments: Eyes closed, mouth open, neck somewhat extended, no current purposeful movements, some moaning.    Labs on Admission: I have personally reviewed following labs and imaging studies  CBC: No results for input(s): WBC, NEUTROABS, HGB, HCT, MCV, PLT in the last 168 hours.   Basic Metabolic Panel: No results for input(s): NA, K, CL, CO2, GLUCOSE, BUN, CREATININE, CALCIUM , MG, PHOS in the last 168 hours.   GFR: CrCl cannot be calculated (Unknown ideal weight.).  Liver Function Tests: No results for input(s): AST, ALT, ALKPHOS, BILITOT, PROT, ALBUMIN in the last 168 hours.   Urine analysis:    Component Value Date/Time   COLORURINE YELLOW 03/26/2022 2057   APPEARANCEUR TURBID (A) 03/26/2022 2057   LABSPEC 1.021 03/26/2022 2057   PHURINE 5.0 03/26/2022 2057   GLUCOSEU >=500 (A) 03/26/2022 2057   HGBUR LARGE (A) 03/26/2022 2057   BILIRUBINUR negative 07/22/2023 1639   BILIRUBINUR + 12/22/2018 1540   KETONESUR negative 07/22/2023 1639   KETONESUR NEGATIVE 03/26/2022 2057   PROTEINUR 100 (A) 03/26/2022 2057   UROBILINOGEN 0.2 07/22/2023 1639   UROBILINOGEN 0.2 09/11/2007 1600   NITRITE Positive (A) 07/22/2023 1639   NITRITE NEGATIVE 03/26/2022 2057   LEUKOCYTESUR Moderate (2+) (A) 07/22/2023 1639   LEUKOCYTESUR LARGE (A) 03/26/2022 2057    Radiological Exams on Admission: No results found.  EKG: Independently reviewed.  Junctional rhythm at 36 bpm.  Right bundle branch block.  Evidence of acute MI.  Assessment/Plan Principal Problem:   Cardiac arrest [facility]) Active Problems:   Hyperlipidemia   OSA (obstructive sleep apnea)   Essential hypertension, benign   Coronary atherosclerosis of native coronary artery   Prostate cancer (HCC)   Anemia   Poorly controlled type 2 diabetes  mellitus with peripheral neuropathy (HCC)   Stage 3b  chronic kidney disease (HCC)   Radiation cystitis   End of life care   Myocardial infarction [facility])   Out-of-hospital cardiac arrest MI, presumed STEMI End-of-life care > Patient presented after out-of-hospital cardiac arrest what was witnessed.  EMS arrived and patient was in V-fib and had prolonged resuscitation.  Received 6 doses of epinephrine, 7 defibrillation shocks, 450 of Amio.  Was intubated and arrived on vent. > EDP was able to speak with family by phone at first and then in person.  They discussed his prognosis and living will stating he would not want to remain on life and support for prolonged period time and would not want to have his life prolonged without expectation of quality of life. > Decision was made not to pursue further workup with cardiology/cath team or other workup.  Patient was transition to DNR/DNI/comfort care. > Comfort care orders have been placed and patient was compassionately extubated.  Vital signs have remained relatively stable and there is no residential hospice available so patient to be admitted for end-of-life care. - Placed on palliative floor - Consult to palliative medicine - Continue morphine infusion - Continue as needed Haldol, Ativan, Tylenol , glycopyrrolate, Senokot  Hypertension Hyperlipidemia Diabetes CKD 3B CAD status post stent Anemia Prostate cancer OSA - Holding home medications - Comfort care as above  DVT prophylaxis: None, comfort care Code Status:   DNR/DNI/comfort care Family Communication:  Updated at bedside  Disposition Plan:   Patient is from:  Home  Anticipated DC to:  Hospice versus in-hospital death  Anticipated DC date:  1 to 2 days  Anticipated DC barriers: None  Consults called:  Order placed for palliative medicine consult Admission status:  Observation, palliative care  Severity of Illness: The appropriate patient status for this patient is  OBSERVATION. Observation status is judged to be reasonable and necessary in order to provide the required intensity of service to ensure the patient's safety. The patient's presenting symptoms, physical exam findings, and initial radiographic and laboratory data in the context of their medical condition is felt to place them at decreased risk for further clinical deterioration. Furthermore, it is anticipated that the patient will be medically stable for discharge from the hospital within 2 midnights of admission.    Marsa KATHEE Scurry MD [facility]  How to contact the [facility] Attending or Consulting provider 7A - 7P or covering provider during after hours 7P -7A, for this patient?   Check the care team in [facility] and look for a) attending/consulting [facility] provider listed and b) the [facility] team listed Log into [email or web address] and use [place]'s universal password to access. If you do not have the password, please contact the hospital operator. Locate the [facility] provider you are looking for under [facility] and page to a number that you can be directly reached. If you still have difficulty reaching the provider, please page the [place] (Director on Call) for the Hospitalists listed on amion for assistance.  09/22/2024, 3:27 PM

## 2024-09-14 NOTE — ED Notes (Signed)
 Wife called and confirms pt is a DNR.

## 2024-09-14 NOTE — Progress Notes (Addendum)
 Consult received for hospice referral if pt appropriate for referral after 1400 today. Per consult details, family would likely prefer Hospice Home in [facility] based on location. Will assist as indicated.   1445: Spoke to pt's DIL Inocente Seip [phone] re hospice home placement. Per DIL, they would like to hold off on referral to [facility]) until they are sure pt is stable for transfer. Spoke to Samantha at [facility] who reports no bed availability at [facility] today. If pt appropriate for transfer to hospice home over weekend, the contact for [facility] is [place] [phone].   Julien Das, MSW, LCSW [phone] (coverage)

## 2024-09-14 NOTE — ED Triage Notes (Signed)
 Pt BIB [facility] from home after witnessed cardiac arrest. of CPR were performed on scene. EMS reports initial rhythm of Vfib with 7 shocks, 6 epis, and 450 of amio. EMS got ROSC and brings patient in with [facility] airway in place assisting with ventilation as well as pacer pads pacing the pt at 70BPM.

## 2024-09-14 NOTE — ED Provider Notes (Signed)
  EMERGENCY DEPARTMENT AT [facility] Provider Note   CSN: [id number] Arrival date & time: 09/14/24  1013     Patient presents with: Cardiac Arrest   Luke Solis is a 89 y.o. male.   HPI    89 year old patient comes in with chief complaint of cardiac arrest.  Patient is coming from home.  He has history of CKD, hypokalemia, hypertension, hyperlipidemia.   Patient had a witnessed arrest per EMS.  When they arrived, patient was in V-fib.  Patient had received about 6 rounds of epi and 7 rounds of shock for V-fib.  He received 450 mg of amiodarone  Prior to Admission medications   Medication Sig Start Date End Date Taking? Authorizing Provider  Vibegron 75 MG TABS Take 1 tablet by mouth daily. 09/11/24  Yes [provider]  amLODipine  (NORVASC ) 5 MG tablet Take 1 tablet (5 mg total) by mouth daily. 04/09/24   Alphonsa Glendia LABOR, MD  benzonatate  (TESSALON ) 200 MG capsule Take 1 capsule (200 mg total) by mouth 3 (three) times daily as needed. 08/17/23   Cook, Jayce G, DO  cephALEXin  (KEFLEX ) 500 MG capsule Take 1 capsule (500 mg total) by mouth 3 (three) times daily. 08/01/24   Alphonsa Glendia LABOR, MD  Cholecalciferol  (D3 HIGH POTENCY) 125 MCG (5000 UT) capsule take 1 capsule daily. 08/23/24   Alphonsa Glendia LABOR, MD  Continuous Blood Gluc Receiver (FREESTYLE LIBRE 14 DAY READER) DEVI On insulin  shots 4 times a day Dx E11.9 07/01/21   Alphonsa Glendia LABOR, MD  Continuous Glucose Sensor (FREESTYLE LIBRE 2 SENSOR) MISC check blood sugar 4 times daily 02/09/24   Alphonsa Glendia LABOR, MD  fexofenadine  (ALLEGRA ) 180 MG tablet Take 1 tablet (180 mg total) by mouth daily as needed for allergies. 06/13/21   Vicci Afton CROME, MD  FORACARE PREMIUM V10 TEST test strip USE AS DIRECTED. 07/20/17   Alphonsa Elsie RAMAN, MD  hyoscyamine  (LEVSIN ) 0.125 MG tablet Take 1 tablet (0.125 mg total) by mouth every 4 (four) hours as needed. For bladder spasms 08/02/24   Mauro Coy C, NP  insulin  glargine  (LANTUS ) 100 UNIT/ML injection Inject 14 Units into the skin at bedtime. 11/09/23   Alphonsa Glendia LABOR, MD  insulin  lispro (HUMALOG  KWIKPEN) 100 UNIT/ML KwikPen sliding scale/101-175-3units/176- 250-4units/251-300-5units /301-350-6units/351-450-7 units/>greater than 450- please call. 09/05/24   Alphonsa Glendia LABOR, MD  Insulin  Pen Needle (GLOBAL EASE INJECT PEN NEEDLES) 32G X 4 MM MISC USE TO INJECT LANTUS  EVERY EVENING AND HUMALOG  THREE (3) TIMES DAILY. 03/25/23   Alphonsa Glendia LABOR, MD  ketoconazole  (NIZORAL ) 2 % cream APPLY THIN AMOUNT TWICE DAILY TO INFLAMMED AREA OF GENITALS. 02/27/21   Alphonsa Glendia LABOR, MD  losartan  (COZAAR ) 25 MG tablet Take 1 tablet (25 mg total) by mouth daily. 06/29/24   Alphonsa Glendia LABOR, MD  Multiple Vitamins-Minerals (PRESERVISION AREDS 2) CAPS Take 1 capsule by mouth 2 (two) times daily. 02/22/22   [provider]  mupirocin  ointment (BACTROBAN ) 2 % Apply 1 Application topically 2 (two) times daily. 08/05/22   Ameduite, Leonna S, FNP  mupirocin  ointment (BACTROBAN ) 2 % Apply 1 Application topically 2 (two) times daily. 05/21/24   Alphonsa Glendia LABOR, MD  nitroGLYCERIN  (NITROSTAT ) 0.4 MG SL tablet Place 1 tablet (0.4 mg total) under the tongue every 5 (five) minutes as needed for chest pain. 06/13/21   Johnson, Clanford L, MD  ofloxacin (OCUFLOX) 0.3 % ophthalmic solution  09/24/21   [provider]  potassium chloride  (KLOR-CON )  10 MEQ tablet take 1 tablet (10 meq total) by mouth daily. 09/03/24   Alphonsa Glendia LABOR, MD  pregabalin  (LYRICA ) 25 MG capsule take 1 capsule 2  times daily as needed for diabetic neuropathy. 06/29/24   Alphonsa Glendia LABOR, MD  rosuvastatin  (CRESTOR ) 5 MG tablet Take 1 tablet (5 mg total) by mouth daily. 08/30/24   Alphonsa Glendia LABOR, MD  Vitamin D , Ergocalciferol , (DRISDOL) 1.25 MG (50000 UNIT) CAPS capsule Take 50,000 Units by mouth every 7 (seven) days.    [provider]  Zinc Sulfate 220 (50 Zn) MG TABS take 1 tablet once daily. 08/23/24   Alphonsa Glendia LABOR, MD    Allergies: Donnice jacobo oleracea] and Celery oil    Review of Systems  Updated Vital Signs BP 122/62   Pulse 70   Resp (!) 25   SpO2 100%   Physical Exam  (all labs ordered are listed, but only abnormal results are displayed) Labs Reviewed  CBC - Abnormal; Notable for the following components:      Result Value   WBC 32.6 (*)    RBC 4.12 (*)    Hemoglobin 12.6 (*)    All other components within normal limits  PROTIME-INR - Abnormal; Notable for the following components:   Prothrombin Time 15.3 (*)    All other components within normal limits  I-STAT CG4 LACTIC ACID, ED - Abnormal; Notable for the following components:   Lactic Acid, Venous 9.9 (*)    All other components within normal limits  I-STAT CHEM 8, ED - Abnormal; Notable for the following components:   Potassium 3.0 (*)    BUN 26 (*)    Creatinine, Ser 1.80 (*)    Glucose, Bld 274 (*)    Calcium , Ion 1.06 (*)    TCO2 17 (*)    Hemoglobin 12.2 (*)    HCT 36.0 (*)    All other components within normal limits  CULTURE, BLOOD (ROUTINE X 2)  CULTURE, BLOOD (ROUTINE X 2)  APTT  COMPREHENSIVE METABOLIC PANEL WITH GFR  MAGNESIUM   PHOSPHORUS  RAPID URINE DRUG SCREEN, HOSP PERFORMED  TRIGLYCERIDES  URINALYSIS, W/ REFLEX TO CULTURE (INFECTION SUSPECTED)  CBG MONITORING, ED  TYPE AND SCREEN  ABO/RH  TROPONIN I (HIGH SENSITIVITY)    EKG: EKG Interpretation Date/Time:  Friday September 14 2024 10:29:21 EDT Ventricular Rate:  36 PR Interval:    QRS Duration:  154 QT Interval:  495 QTC Calculation: 383 R Axis:   96  Text Interpretation: Inferior MI (acute or recent) Junctional rhythm Ventricular premature complex RBBB and LPFB Inferior infarct, acute (LCx) Lateral leads are also involved Confirmed by Charlyn Sora ([id number]) on 09/14/2024 12:32:36 PM  Radiology: [facility] Chest Port 1 View Result Date: 09/14/2024 EXAM: 1 VIEW(S) XRAY OF THE CHEST 09/14/2024 10:53:00 AM COMPARISON: Chest radiograph  08/17/2023. CLINICAL HISTORY: Cardiac arrest. Per triage notes: Pt BIB [facility] from home after witnessed cardiac arrest. of CPR were performed on scene. EMS reports initial rhythm of Vfib with 7 shocks, 6 epis, and 450 of amio. EMS got ROSC and brings patient in with [facility] airway in ; place assisting with ventilation as well as pacer pads pacing the pt at 70BPM. FINDINGS: LINES, TUBES AND DEVICES: Endotracheal tube tip 3.0 cm above the carina. Pacer pad overlies the left chest. LUNGS AND PLEURA: Diffuse mild perihilar streaky opacities. No pulmonary edema. No pleural effusion. No pneumothorax. HEART AND MEDIASTINUM: No acute abnormality of the cardiac and mediastinal silhouettes. BONES AND  SOFT TISSUES: No acute osseous abnormality. IMPRESSION: 1. Diffuse mild perihilar streaky opacities, which may reflect pulmonary edema or aspiration. 2. Well positioned endotracheal tube. Electronically signed by: Selinda Blue MD 09/14/2024 11:22 AM EDT RP Workstation: [id number]     Procedure Name: Intubation Date/Time: 09/14/2024 12:48 PM  Performed by: Charlyn Sora, MDPre-anesthesia Checklist: Patient identified, Patient being monitored, Emergency Drugs available, Timeout performed and Suction available Oxygen Delivery Method: Non-rebreather mask Preoxygenation: Pre-oxygenation with 100% oxygen Induction Type: Rapid sequence Ventilation: Mask ventilation without difficulty Laryngoscope Size: Glidescope and 3 Grade View: Grade I Tube size: 7.5 mm Number of attempts: 1 Placement Confirmation: ETT inserted through vocal cords under direct vision, CO2 detector and Breath sounds checked- equal and bilateral Secured at: 23 cm Dental Injury: Teeth and Oropharynx as per pre-operative assessment     CPR  Date/Time: 09/14/2024 12:49 PM  Performed by: Charlyn Sora, MD Authorized by: Charlyn Sora, MD  CPR Procedure Details:      Amount of time prior to administration of ACLS/BLS (minutes):   35   ACLS/BLS initiated by EMS: Yes     CPR/ACLS performed in the ED: No    CPR performed via ACLS guidelines under my direct supervision.  See RN documentation for details including defibrillator use, medications, doses and timing. Comments:     Patient came into the ER being transcutaneously paced and on epinephrine drip.  Transcutaneous pacing was discontinued. Epinephrine drip was initiated in the ER. .Critical Care  Performed by: Charlyn Sora, MD Authorized by: Charlyn Sora, MD   Critical care provider statement:    Critical care time (minutes):  41   Critical care time was exclusive of:  Separately billable procedures and treating other patients   Critical care was necessary to treat or prevent imminent or life-threatening deterioration of the following conditions:  Circulatory failure, cardiac failure, CNS failure or compromise and respiratory failure   Critical care was time spent personally by me on the following activities:  Development of treatment plan with patient or surrogate, discussions with consultants, evaluation of patient's response to treatment, examination of patient, ordering and review of laboratory studies, ordering and review of radiographic studies, ordering and performing treatments and interventions, pulse oximetry, re-evaluation of patient's condition, review of old charts, obtaining history from patient or surrogate, transcutaneous pacing and ventilator management    Medications Ordered in the ED  Chlorhexidine  Gluconate Cloth 2 % PADS 6 each (has no administration in time range)  fentaNYL  (SUBLIMAZE ) injection 25-50 mcg (has no administration in time range)  fentaNYL  (SUBLIMAZE ) bolus via infusion 25-100 mcg (has no administration in time range)  0.9 %  sodium chloride  infusion (has no administration in time range)  EPINEPHrine (ADRENALIN) 5 mg in NS 250 mL (0.02 mg/mL) premix infusion (15 mcg/min Intravenous Rate/Dose Change 09/14/24 1053)  fentaNYL   (SUBLIMAZE ) injection 50 mcg (50 mcg Intravenous Given 09/14/24 1042)  fentaNYL  (SUBLIMAZE ) injection 50-200 mcg (has no administration in time range)  0.9 %  sodium chloride  infusion (has no administration in time range)  acetaminophen  (TYLENOL ) tablet 650 mg (has no administration in time range)    Or  acetaminophen  (TYLENOL ) suppository 650 mg (has no administration in time range)  glycopyrrolate (ROBINUL) tablet 1 mg (has no administration in time range)    Or  glycopyrrolate (ROBINUL) injection 0.2 mg (has no administration in time range)    Or  glycopyrrolate (ROBINUL) injection 0.2 mg (has no administration in time range)  artificial tears ophthalmic solution 1 drop (has no administration  in time range)  LORazepam (ATIVAN) injection 2-4 mg (has no administration in time range)  morphine 100mg  in NS (1mg /mL) infusion - premix (has no administration in time range)  morphine (PF) 4 MG/ML injection 4 mg (has no administration in time range)  lactated ringers  bolus 1,000 mL (1,000 mLs Intravenous New Bag/Given 09/14/24 1032)    Clinical Course as of 09/14/24 1437  Fri Sep 14, 2024  1035 Patient is EKG looks concerning for MI. As I entered patient's chart, I noted that patient is DNR, that family had called to report that.  I called patient's contact number and was able to get in touch with patient's granddaughter.  She gave me the number of patient's daughter, with whom patient's wife is with.  I called [phone], but on both the times the call goes straight to voicemail.  I called the granddaughter again and at that time I was able to speak with another daughter of the patient.  I informed her that patient had multiple rounds of CPR and shocks and was currently on ventilator.  We wanted to know what the goals of care were.  The daughter states that family will likely want us  to not escalate and be aggressive at this point.  Family is on the way. [AN]  1100 Patient's daughter,  son and wife are at the bedside.  They were aware that patient had CPR for several minutes, and EMS was about to call coroner when they had ROSC. Patient does have a living will.  He would not want to be on life support, or prolong life if there was no good quality.  I discussed with them that patient did have what appears to be myocardial infarction.  Patient has a pulse on pressors, he is on ventilator, but breathing by himself.  In my opinion, likely not best to escalate care at this time by calling cardiologist to the cath team.  Family agrees.  We discussed the next steps in that case to be either rapid de-escalation to comfort care or supportive measures to see how he responds, but that would mean staying on pressors and intubated.  Family has requested 10 or 15 minutes to discuss the next step. [AN]  1242 Family med have agreed for comfort care measures only.  Compassionate extubation to be initiated [AN]  1435 Compassionate extubation completed. Patient still breathing well.  Pressors discontinued, but heart rate and BP are remaining stable.  Upon extubation, patient actually opened his eyes.  He opens eyes to sternal rub.  He still not following commands.  Family made aware of the developments.  They are now at the bedside.  Still, no need to escalate care.  Still proceeding with comfort care.   It is unclear how patient would proceed.  We will request medicine admission for comfort measures. [AN]    Clinical Course User Index [AN] Charlyn Sora, MD                                 Medical Decision Making Amount and/or Complexity of Data Reviewed Labs: ordered. Radiology: ordered.  Risk OTC drugs. Prescription drug management.   89 year old male with history of diabetes, CAD, hypertension and hyperlipidemia comes in with chief complaint of cardiac arrest.  Witnessed arrest at home.  When EMS arrived, patient was in V-fib.  He has received multiple shocks, 450 mg of amiodarone  and 6 rounds of epinephrine.  Blood  sugar is normal.  Per family, patient was doing well until this event.  Patient came into the ER being paced.  He also was on epinephrine drip.  Epinephrine drip was continued, pacing was discontinued.  Patient had a pulse in the 40s.  Patient had [facility] airway in place.  He was intubated.  After intubation and switching patient to epi drip, we contacted family.  Patient is DNR, I was informed that they would likely pursue comfort care.  EKG shows ST elevation MI.  With the information made available to me, I did not activate code STEMI at that time.  Ultimately, family arrived to the bedside and I informed him on the patient's status and that he likely had a massive heart attack.  Family makes it clear, that if patient does not have good quality of life, then they would not want to prolong his life.  In my judgment, with over 30 minutes of CPR, V-fib arrest and patient not making any purposeful movement right now, patient likely has had a massive heart attack.  X-ray shows some pulmonary edema.  Additionally, patient likely has broken ribs.  He is on pressors right now.  I have low suspicion in a 89 year old to have remarkable recovery and good quality of life after this event.  I confirmed with the family that escalation of care likely not the best idea.  They have ultimately decided to de-escalate care and focus on compassionate care only over supportive care.   As such, we will cancel CT scan of the brain, echocardiogram.  TOC will be consulted in case patient needs placement.   Final diagnoses:  Cardiac arrest [facility])  Ventricular fibrillation (HCC)  Inferior myocardial infarction [facility])    ED Discharge Orders     None          Charlyn Sora, MD 09/14/24 1254

## 2024-09-14 NOTE — Progress Notes (Signed)
 Responded to spiritual Consult.  Prayed for Patient. Pt facing  EOL..  Chaplain available as needed.  Rayleen Dade, [place], [facility], Pager [phone]

## 2024-09-15 DIAGNOSIS — I469 Cardiac arrest, cause unspecified: Secondary | ICD-10-CM | POA: Diagnosis not present

## 2024-09-19 LAB — CULTURE, BLOOD (ROUTINE X 2)
Culture: NO GROWTH
Culture: NO GROWTH
Special Requests: ADEQUATE

## 2024-09-22 DIAGNOSIS — I219 Acute myocardial infarction, unspecified: Secondary | ICD-10-CM | POA: Insufficient documentation

## 2024-10-06 DIAGNOSIS — 419620001 Death: Secondary | SNOMED CT | POA: Diagnosis not present

## 2024-10-06 NOTE — Death Summary Note (Signed)
   DEATH SUMMARY   Patient Details  Name: Luke Solis MRN: [id number] DOB: 11-08-1931 ERE:Olxpwh, Glendia LABOR, MD  Admission/Discharge Information   Admit Date:  October 06, 2024  Date of Death: Date of Death: Oct 07, 2024  Time of Death: Time of Death: 0206  Length of Stay: 0   Principle Cause of death: Out of hospital cardiac arrest   Hospital Diagnoses: Principal Problem:   Cardiac arrest [facility]) Active Problems:   Hyperlipidemia   OSA (obstructive sleep apnea)   Essential hypertension, benign   Coronary atherosclerosis of native coronary artery   Prostate cancer (HCC)   Anemia   Poorly controlled type 2 diabetes mellitus with peripheral neuropathy (HCC)   Stage 3b chronic kidney disease (HCC)   Radiation cystitis   End of life care   Hospital Course:  89 y.o. male with medical history significant of hypertension, hyperlipidemia, diabetes, CKD 3B, CAD status post stent, anemia, prostate cancer with radiation cystitis and suprapubic catheter, OSA presented after witnessed out of hospital cardiac arrest. EMS arrived and patient was in V-fib and had prolonged resuscitation.  Received 6 doses of epinephrine, 7 defibrillation shocks, 450 of Amio.  Was intubated and arrived on vent. EDP was able to speak with family by phone at first and then in person.  They discussed his prognosis and living will stating he would not want to remain on life and support for prolonged period time and would not want to have his life prolonged without expectation of quality of life. Decision was made not to pursue further workup with cardiology/cath team or other workup.  Patient was transition to DNR/DNI/comfort care. Comfort care orders placed and patient was compassionately extubated. Patient expired at 2:06 AM.    Procedures: None   Consultations: Palliative care   The results of significant diagnostics from this hospitalization (including imaging, microbiology, ancillary and laboratory) are listed below for  reference.   Significant Diagnostic Studies: DG Chest Port 1 View Result Date: 06-Oct-2024 EXAM: 1 VIEW(S) XRAY OF THE CHEST October 06, 2024 10:53:00 AM COMPARISON: Chest radiograph 08/17/2023. CLINICAL HISTORY: Cardiac arrest. Per triage notes: Pt BIB [facility] from home after witnessed cardiac arrest. of CPR were performed on scene. EMS reports initial rhythm of Vfib with 7 shocks, 6 epis, and 450 of amio. EMS got ROSC and brings patient in with [facility] airway in ; place assisting with ventilation as well as pacer pads pacing the pt at 70BPM. FINDINGS: LINES, TUBES AND DEVICES: Endotracheal tube tip 3.0 cm above the carina. Pacer pad overlies the left chest. LUNGS AND PLEURA: Diffuse mild perihilar streaky opacities. No pulmonary edema. No pleural effusion. No pneumothorax. HEART AND MEDIASTINUM: No acute abnormality of the cardiac and mediastinal silhouettes. BONES AND SOFT TISSUES: No acute osseous abnormality. IMPRESSION: 1. Diffuse mild perihilar streaky opacities, which may reflect pulmonary edema or aspiration. 2. Well positioned endotracheal tube. Electronically signed by: Selinda Blue MD 2024-10-06 11:22 AM EDT RP Workstation: [id number]    Microbiology: No results found for this or any previous visit (from the past 240 hours).  Time spent: <35 minutes  Signed: Dorn Dawson, MD 07-Oct-2024

## 2024-10-06 NOTE — Progress Notes (Signed)
 90ml of Morphine 100mg  in NS premix infusion wasted in stericycle. Witness to waste is [facility] RN and Delon Lunger RN.

## 2024-10-06 DEATH — deceased

## 2024-10-19 ENCOUNTER — Ambulatory Visit

## 2025-01-28 ENCOUNTER — Ambulatory Visit: Admitting: Family Medicine
